# Patient Record
Sex: Male | Born: 1945
Health system: Southern US, Community
[De-identification: ages and names within clinical notes are randomized; demographics above are authoritative.]

## PROBLEM LIST (undated history)

## (undated) DIAGNOSIS — J90 Pleural effusion, not elsewhere classified: Secondary | ICD-10-CM

## (undated) DIAGNOSIS — I509 Heart failure, unspecified: Secondary | ICD-10-CM

## (undated) DIAGNOSIS — N179 Acute kidney failure, unspecified: Secondary | ICD-10-CM

## (undated) DIAGNOSIS — R579 Shock, unspecified: Secondary | ICD-10-CM

## (undated) DIAGNOSIS — J9690 Respiratory failure, unspecified, unspecified whether with hypoxia or hypercapnia: Secondary | ICD-10-CM

## (undated) DIAGNOSIS — I429 Cardiomyopathy, unspecified: Secondary | ICD-10-CM

## (undated) DIAGNOSIS — I4891 Unspecified atrial fibrillation: Secondary | ICD-10-CM

## (undated) DIAGNOSIS — G4730 Sleep apnea, unspecified: Secondary | ICD-10-CM

## (undated) DIAGNOSIS — D62 Acute posthemorrhagic anemia: Secondary | ICD-10-CM

## (undated) DIAGNOSIS — I10 Essential (primary) hypertension: Secondary | ICD-10-CM

## (undated) DIAGNOSIS — S301XXA Contusion of abdominal wall, initial encounter: Secondary | ICD-10-CM

## (undated) DIAGNOSIS — R739 Hyperglycemia, unspecified: Secondary | ICD-10-CM

## (undated) HISTORY — DX: Unspecified atrial fibrillation: I48.91

## (undated) HISTORY — DX: Cardiomyopathy, unspecified: I42.9

## (undated) HISTORY — DX: Essential (primary) hypertension: I10

---

## 2012-03-26 ENCOUNTER — Encounter (HOSPITAL_COMMUNITY): Payer: Self-pay | Admitting: *Deleted

## 2012-03-26 ENCOUNTER — Other Ambulatory Visit: Payer: Self-pay

## 2012-03-26 ENCOUNTER — Emergency Department (HOSPITAL_COMMUNITY): Payer: Medicaid Other

## 2012-03-26 ENCOUNTER — Inpatient Hospital Stay (HOSPITAL_COMMUNITY)
Admission: EM | Admit: 2012-03-26 | Discharge: 2012-04-17 | DRG: 291 | Disposition: A | Discharge status: Home / Self Care | Payer: Medicaid Other | Attending: Cardiovascular Disease | Admitting: Cardiovascular Disease

## 2012-03-26 DIAGNOSIS — I509 Heart failure, unspecified: Secondary | ICD-10-CM

## 2012-03-26 DIAGNOSIS — I5021 Acute systolic (congestive) heart failure: Secondary | ICD-10-CM

## 2012-03-26 DIAGNOSIS — N179 Acute kidney failure, unspecified: Secondary | ICD-10-CM | POA: Diagnosis present

## 2012-03-26 DIAGNOSIS — J90 Pleural effusion, not elsewhere classified: Secondary | ICD-10-CM | POA: Diagnosis present

## 2012-03-26 DIAGNOSIS — I129 Hypertensive chronic kidney disease with stage 1 through stage 4 chronic kidney disease, or unspecified chronic kidney disease: Secondary | ICD-10-CM | POA: Diagnosis present

## 2012-03-26 DIAGNOSIS — R578 Other shock: Secondary | ICD-10-CM | POA: Diagnosis not present

## 2012-03-26 DIAGNOSIS — J9600 Acute respiratory failure, unspecified whether with hypoxia or hypercapnia: Secondary | ICD-10-CM | POA: Diagnosis present

## 2012-03-26 DIAGNOSIS — I2589 Other forms of chronic ischemic heart disease: Secondary | ICD-10-CM | POA: Diagnosis present

## 2012-03-26 DIAGNOSIS — D62 Acute posthemorrhagic anemia: Secondary | ICD-10-CM | POA: Diagnosis not present

## 2012-03-26 DIAGNOSIS — N170 Acute kidney failure with tubular necrosis: Secondary | ICD-10-CM | POA: Diagnosis not present

## 2012-03-26 DIAGNOSIS — E46 Unspecified protein-calorie malnutrition: Secondary | ICD-10-CM | POA: Diagnosis not present

## 2012-03-26 DIAGNOSIS — N189 Chronic kidney disease, unspecified: Secondary | ICD-10-CM

## 2012-03-26 DIAGNOSIS — E871 Hypo-osmolality and hyponatremia: Secondary | ICD-10-CM | POA: Diagnosis not present

## 2012-03-26 DIAGNOSIS — I5023 Acute on chronic systolic (congestive) heart failure: Principal | ICD-10-CM | POA: Diagnosis present

## 2012-03-26 DIAGNOSIS — N19 Unspecified kidney failure: Secondary | ICD-10-CM

## 2012-03-26 DIAGNOSIS — R680 Hypothermia, not associated with low environmental temperature: Secondary | ICD-10-CM | POA: Diagnosis not present

## 2012-03-26 DIAGNOSIS — I472 Ventricular tachycardia, unspecified: Secondary | ICD-10-CM | POA: Diagnosis present

## 2012-03-26 DIAGNOSIS — I4729 Other ventricular tachycardia: Secondary | ICD-10-CM | POA: Diagnosis present

## 2012-03-26 DIAGNOSIS — K625 Hemorrhage of anus and rectum: Secondary | ICD-10-CM | POA: Diagnosis not present

## 2012-03-26 DIAGNOSIS — I4891 Unspecified atrial fibrillation: Secondary | ICD-10-CM | POA: Diagnosis not present

## 2012-03-26 DIAGNOSIS — R570 Cardiogenic shock: Secondary | ICD-10-CM | POA: Diagnosis not present

## 2012-03-26 DIAGNOSIS — R739 Hyperglycemia, unspecified: Secondary | ICD-10-CM

## 2012-03-26 DIAGNOSIS — I4892 Unspecified atrial flutter: Secondary | ICD-10-CM

## 2012-03-26 DIAGNOSIS — S301XXA Contusion of abdominal wall, initial encounter: Secondary | ICD-10-CM

## 2012-03-26 DIAGNOSIS — K5900 Constipation, unspecified: Secondary | ICD-10-CM | POA: Diagnosis not present

## 2012-03-26 DIAGNOSIS — E876 Hypokalemia: Secondary | ICD-10-CM | POA: Diagnosis not present

## 2012-03-26 DIAGNOSIS — T45515A Adverse effect of anticoagulants, initial encounter: Secondary | ICD-10-CM | POA: Diagnosis not present

## 2012-03-26 HISTORY — DX: Respiratory failure, unspecified, unspecified whether with hypoxia or hypercapnia: J96.90

## 2012-03-26 HISTORY — DX: Sleep apnea, unspecified: G47.30

## 2012-03-26 HISTORY — DX: Heart failure, unspecified: I50.9

## 2012-03-26 HISTORY — DX: Hyperglycemia, unspecified: R73.9

## 2012-03-26 HISTORY — DX: Acute kidney failure, unspecified: N17.9

## 2012-03-26 HISTORY — DX: Acute posthemorrhagic anemia: D62

## 2012-03-26 HISTORY — DX: Pleural effusion, not elsewhere classified: J90

## 2012-03-26 HISTORY — DX: Contusion of abdominal wall, initial encounter: S30.1XXA

## 2012-03-26 HISTORY — DX: Shock, unspecified: R57.9

## 2012-03-26 LAB — CARDIAC PANEL(CRET KIN+CKTOT+MB+TROPI)
Relative Index: 6.8 — ABNORMAL HIGH (ref 0.0–2.5)
Troponin I: <0.3 ng/mL (ref <0.30)

## 2012-03-26 LAB — COMPREHENSIVE METABOLIC PANEL
BUN: 26 mg/dL — ABNORMAL HIGH (ref 6–23)
CO2: 22 mEq/L (ref 19–32)
Calcium: 9.5 mg/dL (ref 8.4–10.5)
Chloride: 103 mEq/L (ref 96–112)
Creatinine, Ser: 1.49 mg/dL — ABNORMAL HIGH (ref 0.50–1.35)
GFR calc Af Amer: 55 mL/min — ABNORMAL LOW (ref >90)
GFR calc non Af Amer: 48 mL/min — ABNORMAL LOW (ref >90)
Total Bilirubin: 1.1 mg/dL (ref 0.3–1.2)

## 2012-03-26 LAB — CBC
HCT: 49.5 % (ref 39.0–52.0)
Hemoglobin: 16.1 g/dL (ref 13.0–17.0)
MCHC: 32.5 g/dL (ref 30.0–36.0)
MCV: 90.8 fL (ref 78.0–100.0)
RDW: 15.1 % (ref 11.5–15.5)
WBC: 9.3 10*3/uL (ref 4.0–10.5)

## 2012-03-26 LAB — DIFFERENTIAL
Eosinophils Relative: 0 % (ref 0–5)
Lymphocytes Relative: 11 % — ABNORMAL LOW (ref 12–46)
Monocytes Absolute: 0.9 10*3/uL (ref 0.1–1.0)
Monocytes Relative: 10 % (ref 3–12)
Neutro Abs: 7.3 10*3/uL (ref 1.7–7.7)

## 2012-03-26 LAB — TROPONIN I: Troponin I: 0.34 ng/mL (ref <0.30)

## 2012-03-26 LAB — MRSA PCR SCREENING: MRSA by PCR: NEGATIVE

## 2012-03-26 MED ORDER — ONDANSETRON HCL 4 MG/2ML IJ SOLN: 4.0000 mg | Freq: Four times a day (QID) | INTRAMUSCULAR | Status: DC | PRN

## 2012-03-26 MED ORDER — DILTIAZEM HCL 100 MG IV SOLR
5.0000 mg/h | INTRAVENOUS | Status: DC
  Administered 2012-03-26 – 2012-03-27 (×2): 5 mg/h via INTRAVENOUS
  Administered 2012-03-27: 20 mg/h via INTRAVENOUS
  Filled 2012-03-26 (×2): qty 100

## 2012-03-26 MED ORDER — DILTIAZEM LOAD VIA INFUSION
15.0000 mg | Freq: Once | INTRAVENOUS | Status: AC
  Administered 2012-03-26: 15 mg via INTRAVENOUS
  Filled 2012-03-26: qty 15

## 2012-03-26 MED ORDER — FUROSEMIDE 10 MG/ML IJ SOLN
40.0000 mg | Freq: Once | INTRAMUSCULAR | Status: AC
  Administered 2012-03-26: 40 mg via INTRAVENOUS
  Filled 2012-03-26: qty 4

## 2012-03-26 MED ORDER — NITROGLYCERIN 2 % TD OINT
1.0000 [in_us] | TOPICAL_OINTMENT | Freq: Once | TRANSDERMAL | Status: AC
  Administered 2012-03-26: 1 [in_us] via TOPICAL
  Filled 2012-03-26: qty 30

## 2012-03-26 MED ORDER — FUROSEMIDE 10 MG/ML IJ SOLN
40.0000 mg | Freq: Every day | INTRAMUSCULAR | Status: DC
  Filled 2012-03-26: qty 4

## 2012-03-26 MED ORDER — ACETAMINOPHEN 325 MG PO TABS: 650.0000 mg | ORAL_TABLET | ORAL | Status: DC | PRN

## 2012-03-26 MED ORDER — SODIUM CHLORIDE 0.9 % IJ SOLN
3.0000 mL | INTRAMUSCULAR | Status: DC | PRN
  Administered 2012-04-13: 3 mL via INTRAVENOUS

## 2012-03-26 MED ORDER — SODIUM CHLORIDE 0.9 % IJ SOLN
3.0000 mL | Freq: Two times a day (BID) | INTRAMUSCULAR | Status: DC
  Administered 2012-03-26 – 2012-04-17 (×35): 3 mL via INTRAVENOUS

## 2012-03-26 MED ORDER — NITROGLYCERIN 2 % TD OINT: 1.0000 [in_us] | TOPICAL_OINTMENT | Freq: Once | TRANSDERMAL | Status: DC

## 2012-03-26 MED ORDER — HEPARIN (PORCINE) IN NACL 100-0.45 UNIT/ML-% IJ SOLN
1200.0000 [IU]/h | INTRAMUSCULAR | Status: DC
  Administered 2012-03-26: 1200 [IU]/h via INTRAVENOUS
  Filled 2012-03-26: qty 250

## 2012-03-26 MED ORDER — ADENOSINE 6 MG/2ML IV SOLN
INTRAVENOUS | Status: AC
  Filled 2012-03-26: qty 4

## 2012-03-26 MED ORDER — ADENOSINE 6 MG/2ML IV SOLN
INTRAVENOUS | Status: AC
  Administered 2012-03-26: 6 mg via INTRAVENOUS
  Filled 2012-03-26: qty 2

## 2012-03-26 MED ORDER — SODIUM CHLORIDE 0.9 % IV SOLN: 250.0000 mL | INTRAVENOUS | Status: DC | PRN

## 2012-03-26 MED ORDER — ADENOSINE 6 MG/2ML IV SOLN
12.0000 mg | Freq: Once | INTRAVENOUS | Status: AC
  Administered 2012-03-26: 12 mg via INTRAVENOUS
  Administered 2012-03-26: 6 mg via INTRAVENOUS

## 2012-03-26 MED ORDER — ASPIRIN EC 81 MG PO TBEC
81.0000 mg | DELAYED_RELEASE_TABLET | Freq: Every day | ORAL | Status: DC
  Administered 2012-03-26 – 2012-04-01 (×6): 81 mg via ORAL
  Filled 2012-03-26 (×8): qty 1

## 2012-03-26 MED ORDER — NITROGLYCERIN 2 % TD OINT
0.5000 [in_us] | TOPICAL_OINTMENT | Freq: Once | TRANSDERMAL | Status: AC
  Administered 2012-03-26: 0.5 [in_us] via TOPICAL
  Filled 2012-03-26: qty 1

## 2012-03-26 MED ORDER — DILTIAZEM HCL 50 MG/10ML IV SOLN
INTRAVENOUS | Status: AC
  Administered 2012-03-26: 10 mg
  Filled 2012-03-26: qty 10

## 2012-03-26 MED ORDER — HEPARIN BOLUS VIA INFUSION
5000.0000 [IU] | Freq: Once | INTRAVENOUS | Status: AC
  Administered 2012-03-26: 5000 [IU] via INTRAVENOUS
  Filled 2012-03-26: qty 5000

## 2012-03-26 NOTE — ED Notes (Signed)
[facility] called, gave report 

## 2012-03-26 NOTE — ED Notes (Signed)
Pt states became SOB while cooking breakfast. [facility] and was sent here secondary to SOB and to r/o CHF.

## 2012-03-26 NOTE — ED Provider Notes (Cosign Needed)
History  This chart was scribed for Martin Lennert, MD by Martin [name]. This patient was seen in room APA07/APA07 and the patient's care was started at 4:56PM.  CSN: [id number]  Arrival date & time 03/26/12  1624   First MD Initiated Contact with Patient 03/26/12 1654      Chief Complaint  Patient presents with  . Shortness of Breath  . Cough    Patient is a 66 y.o. male presenting with shortness of breath. The history is provided by the patient. No language interpreter was used.  Shortness of Breath  The current episode started yesterday. The onset was gradual. The problem occurs continuously. The problem has been gradually worsening. The problem is moderate. The symptoms are relieved by rest. The symptoms are aggravated by activity. Associated symptoms include shortness of breath. Pertinent negatives include no chest pain, no fever, no rhinorrhea, no sore throat, no cough and no wheezing. There was no intake of a foreign body. He has not inhaled smoke recently. His past medical history does not include asthma. There were no sick contacts.    Martin [name] is a 66 y.o. male who presents to the Emergency Department complaining of 24 hours of gradual onset, gradually worsening, constant SOB while cooking breakfast. He reports that the SOB is worse with activity and better with rest. He has not tried any at home treatments to improve symptoms PTA. He reports extreme SOB with walking short distances within his house, such as from the kitchen to the bathroom. He denies any current pain such as chest pain or abdominal pain. He lists one week of gradual onset, gradually worsening, constant swelling in bilateral lower legs as an associated symptom. He denies any dizziness, diaphoresis, weakness, nausea or emesis as associated symptoms. He was seen at a [facility] this afternoon and was sent here for further evaluation. Pt has a h/o CHF and HTN but is not on any current medication for the HTN. He is a  frequent alcohol user but denies smoking.  Past Medical History  Diagnosis Date  . CHF (congestive heart failure)   . Hypertension     History reviewed. No pertinent past surgical history.  History reviewed. No pertinent family history.  History  Substance Use Topics  . Smoking status: Never Smoker   . Smokeless tobacco: Not on file  . Alcohol Use: Yes     heavy drinker      Review of Systems  Constitutional: Negative for fever and fatigue.  HENT: Negative for congestion, sore throat, rhinorrhea, sinus pressure and ear discharge.   Eyes: Negative for discharge.  Respiratory: Positive for shortness of breath. Negative for cough and wheezing.   Cardiovascular: Positive for leg swelling. Negative for chest pain.  Gastrointestinal: Negative for abdominal pain and diarrhea.  Genitourinary: Negative for frequency and hematuria.  Musculoskeletal: Negative for back pain.  Skin: Negative for rash.  Neurological: Negative for seizures.  Hematological: Negative.   Psychiatric/Behavioral: Negative for hallucinations.    Allergies  Review of patient's allergies indicates not on file.  Home Medications  No current outpatient prescriptions on file.  Triage Vitals: BP 152/117  Temp(Src) 98.2 F (36.8 C) (Oral)  Resp 44  Ht 5\' 5"  (1.651 m)  Wt 228 lb (103.42 kg)  BMI 37.94 kg/m2  SpO2 97%  Physical Exam  Nursing note and vitals reviewed. Constitutional: He is oriented to person, place, and time. He appears well-developed.  HENT:  Head: Normocephalic and atraumatic.  Eyes: Conjunctivae and EOM are  normal. No scleral icterus.  Neck: Neck supple. No thyromegaly present.  Cardiovascular: Regular rhythm and normal heart sounds.  Tachycardia present.  Exam reveals no gallop and no friction rub.   No murmur heard. Pulmonary/Chest: No stridor. He has no wheezes. He has no rales. He exhibits no tenderness.  Abdominal: He exhibits no distension. There is no tenderness. There is no  rebound.  Musculoskeletal: Normal range of motion. He exhibits edema (2+ edema in bilateral thighs, 3+ edema in bilateral lower legs from knees down).  Lymphadenopathy:    He has no cervical adenopathy.  Neurological: He is oriented to person, place, and time. Coordination normal.  Skin: No rash noted. No erythema.  Psychiatric: He has a normal mood and affect. His behavior is normal.    ED Course  Procedures (including critical care time)  DIAGNOSTIC STUDIES: Oxygen Saturation is 97% on O2, adequate by my interpretation.    COORDINATION OF CARE: 4:59PM-Discussed treatment plan with pt and pt agreed to plan. Will give pt medicine for tachycardia.  5:30PM-Discussed radiology report with pt and pt acknowledged report. Discussed further treatment plan and pt agreed. 5:40PM-Spoke with Martin [name] at [facility] and we agreed on the pt being transferred.    Labs Reviewed  DIFFERENTIAL - Abnormal; Notable for the following:    Neutrophils Relative 79 (*)    Lymphocytes Relative 11 (*)    All other components within normal limits  COMPREHENSIVE METABOLIC PANEL - Abnormal; Notable for the following:    Glucose, Bld 162 (*)    BUN 26 (*)    Creatinine, Ser 1.49 (*)    Albumin 3.4 (*)    AST 91 (*)    ALT 156 (*)    GFR calc non Af Amer 48 (*)    GFR calc Af Amer 55 (*)    All other components within normal limits  CBC  TROPONIN I    Dg Chest Port 1 View  03/26/2012  *RADIOLOGY REPORT*  Clinical Data: Shortness of breath and hypertension.  PORTABLE CHEST - 1 VIEW  Comparison: None.  Findings: The heart is enlarged.  The mediastinal and hilar contours are prominent.  Large area of opacification involving the right lung may be a combination of effusion and atelectasis or infiltrate.  There is central vascular congestion without overt pulmonary edema.  No left-sided pleural effusion.  IMPRESSION:  1.  Cardiac enlargement and prominent mediastinal and hilar contours. 2.  Right lung process  possibly a combination of effusion and atelectasis and/or infiltrate.  Original Report Authenticated By: P. Loralie Champagne, M.D.    Date: 03/26/2012  Rate:161  Rhythm: supraventricular tachycardia (SVT)  QRS Axis: normal  Intervals: normal  ST/T Wave abnormalities: normal  Conduction Disutrbances:none  Narrative Interpretation:   Old EKG Reviewed: none available    No diagnosis found.. CRITICAL CARE Performed by: Mikiyah Glasner L   Total critical care time:45  Critical care time was exclusive of separately billable procedures and treating other patients.  Critical care was necessary to treat or prevent imminent or life-threatening deterioration.  Critical care was time spent personally by me on the following activities: development of treatment plan with patient and/or surrogate as well as nursing, discussions with consultants, evaluation of patient's response to treatment, examination of patient, obtaining history from patient or surrogate, ordering and performing treatments and interventions, ordering and review of laboratory studies, ordering and review of radiographic studies, pulse oximetry and re-evaluation of patient's condition.  I gave the pt adenosine twice and cardiazem  without any slowing of the heart.  I spoke with dr. Melburn Popper and he stated to not try any more medicine and they will admit at [facility]  Chf, tachycardia.  cardiomyapathy  The chart was scribed for me under my direct supervision.  I personally performed the history, physical, and medical decision making and all procedures in the evaluation of this patient.Martin Lennert, MD 03/26/12 1813  Martin Lennert, MD 03/26/12 [phone]

## 2012-03-26 NOTE — H&P (Addendum)
Admit date: 03/26/2012 Referring Physician  Dr. Estell Harpin Chief complaint/reason for admission: CHF/SVT  HPI: This is a 66yo AA male with no PMH except HTN on no meds who presented to [facility] with compliants of SOB and cough.  This began today and was gradual in onset.  He says that he has had a nonproductive cough for about 3 days.  He denies any fever or chills. He denies any chest pain.  He denies any palpitations, dizziness or syncope but he has noticed LE edema for about 4 days. He was noted to be in SVT in the ER and was given Adenosine 6mg  then 12mg  with no change. He was very hypertensive in the ER 151/121mmHg.  He was given 10mg  IV Cardizem with no improvement in HTN or tachcyardia.  In route to [facility] he was given NTP and Lasix 40mg  IV due to SOB.  He was also given Labetolol 5mg  IV in route. On arrival at [facility] he was in SVT at 153bpm and 12 lead EKG appears to be atrial flutter.  He is asymptomatic at this time.  Of note he has a history of ETOH abuse in the past but has not had any ETOH in a month.    PMH:    Past Medical History  Diagnosis Date  . Hypertension     PSH:   History reviewed. No pertinent past surgical history.  ALLERGIES:   Review of patient's allergies indicates no known allergies.  Prior to Admit Meds:   No prescriptions prior to admission   Family HX:   History reviewed. No pertinent family history. Social HX:    History   Social History  . Marital Status: Married    Spouse Name: N/A    Number of Children: N/A  . Years of Education: N/A   Occupational History  . Not on file.   Social History Main Topics  . Smoking status: Never Smoker   . Smokeless tobacco: Not on file  . Alcohol Use: Yes     heavy drinker  . Drug Use: No  . Sexually Active: Yes   Other Topics Concern  . Not on file   Social History Narrative  . No narrative on file     ROS:  All 11 ROS were addressed and are negative except what is stated in the HPI  PHYSICAL  EXAM Filed Vitals:   03/26/12 2110  BP: 112/86  Pulse: 154  Temp: 94.4 F (34.7 C)  Resp: 31   General: Well developed, well nourished, in no acute distress Head: Eyes PERRLA, No xanthomas.   Normal cephalic and atramatic  Lungs:   Clear bilaterally to auscultation and percussion. Heart:   Irregularly irregular, tachycardic S1 S2 Pulses are 2+ & equal.            No carotid bruit. No JVD.  No abdominal bruits. No femoral bruits. Abdomen: Bowel sounds are positive, abdomen soft and non-tender without masses  Extremities:   No clubbing, cyanosis or edema.  DP +1 Neuro: Alert and oriented X 3. Psych:  Good affect, responds appropriately   Labs:   Lab Results  Component Value Date   WBC 9.3 03/26/2012   HGB 16.1 03/26/2012   HCT 49.5 03/26/2012   MCV 90.8 03/26/2012   PLT 201 03/26/2012    Lab 03/26/12 1715  NA 142  K 3.8  CL 103  CO2 22  BUN 26*  CREATININE 1.49*  CALCIUM 9.5  PROT 7.3  BILITOT 1.1  ALKPHOS 116  ALT 156*  AST 91*  GLUCOSE 162*   Lab Results  Component Value Date   TROPONINI 0.34* 03/26/2012       Radiology:  *RADIOLOGY REPORT*  Clinical Data: Shortness of breath and hypertension.  PORTABLE CHEST - 1 VIEW  Comparison: None.  Findings: The heart is enlarged. The mediastinal and hilar  contours are prominent. Large area of opacification involving the  right lung may be a combination of effusion and atelectasis or  infiltrate. There is central vascular congestion without overt  pulmonary edema. No left-sided pleural effusion.  IMPRESSION:  1. Cardiac enlargement and prominent mediastinal and hilar  contours.  2. Right lung process possibly a combination of effusion and  atelectasis and/or infiltrate.  Original Report Authenticated By: P. Loralie Champagne, M.D.   EKG:  SVT - probable atrial flutter with RVR  ASSESSMENT:  1.  SVT c/w atrial flutter with RVR of unknown duration 2.  Hypertensive urgency 3.  Acute CHF most likely secondary to SVT and  HTN.  Need to also consider alcoholic DCM with history of ETOH abuse. 4.  Acute renal insufficiency 5.  Hepatic transaminitis - ? ETOH related 6.  Elevated troponin in the setting of tachycardia and HTN urgency c/w NSTEMI type II   PLAN:   1.  Admit to stepdown unit 2.  IV Cardizem 15mg  bolus followed by 5mg /hr and titrate to keep HR less than 100bpm and SBP less than and greater than 3.  IV Heparin gtt since length of aflutter unknown 4.  Cycle cardiac enzymes 5.  Hepatitis panel/TSH/BNP 6.  IV Lopressor as needed for rate control 7.  2D echo in am to assess LVF 8.  Further w/u per [facility] 9.  Continue NTP for BP control  Quintella Reichert, MD  03/26/2012  9:44 PM

## 2012-03-26 NOTE — Progress Notes (Signed)
ANTICOAGULATION CONSULT NOTE - Initial Consult  Pharmacy Consult for heparin Indication: atrial fibrillation  No Known Allergies  Patient Measurements: Height: 5\' 5"  (165.1 cm) Weight: 222 lb 14.2 oz (101.1 kg) IBW/kg (Calculated) : 61.5  Heparin Dosing Weight: 84  Vital Signs: Temp: 94.4 F (34.7 C) (04/01 2110) Temp src: Axillary (04/01 2110) BP: 112/86 mmHg (04/01 2110) Pulse Rate: 154  (04/01 2110)  Labs:  Basename 03/26/12 1715  HGB 16.1  HCT 49.5  PLT 201  APTT --  LABPROT --  INR --  HEPARINUNFRC --  CREATININE 1.49*  CKTOTAL --  CKMB --  TROPONINI 0.34*   Estimated Creatinine Clearance: 55.5 ml/min (by C-G formula based on Cr of 1.49).  Medical History: Past Medical History  Diagnosis Date  . Hypertension     Medications:  No prescriptions prior to admission    Assessment: 66 yo M admitted with new aflutter.  Goal of Therapy:  Heparin level 0.3-0.7 units/ml   Plan:  1.  Heparin 5000 units bolus x 1 2.  Heparin drip at 1200 units/hr 3.  Heparin level 6h after drip starts 4.  Check daily heparin level and CBC  Edelyn Heidel L. Illene Bolus, PharmD, BCPS Clinical Pharmacist Pager: [phone] 03/26/2012 10:39 PM

## 2012-03-26 NOTE — ED Notes (Signed)
CRITICAL VALUE ALERT  Critical value received:  Troponin 0.34  Date of notification:  03/26/12  Time of notification:  1808  Critical value read back: yes  Nurse who received alert:  Arva Chafe RN  MD notified (1st page):  zammit  Time of first page:  1809  MD notified (2nd page):  Time of second page:  Responding MD:  zammit  Time MD responded:  [phone]

## 2012-03-26 NOTE — ED Notes (Signed)
Pt reports severe sob for the past 3 weeks.  Pt reports "i feel like my heart is racing".  Pt denies any cp, n/v, diaphoresis.  Pt in room on monitor.  Family at bedside.  edp aware of pt's heart rate.

## 2012-03-27 ENCOUNTER — Inpatient Hospital Stay (HOSPITAL_COMMUNITY): Payer: Medicaid Other

## 2012-03-27 ENCOUNTER — Other Ambulatory Visit: Payer: Self-pay

## 2012-03-27 DIAGNOSIS — R570 Cardiogenic shock: Secondary | ICD-10-CM | POA: Diagnosis not present

## 2012-03-27 DIAGNOSIS — I509 Heart failure, unspecified: Secondary | ICD-10-CM

## 2012-03-27 DIAGNOSIS — I4891 Unspecified atrial fibrillation: Secondary | ICD-10-CM | POA: Diagnosis present

## 2012-03-27 DIAGNOSIS — J984 Other disorders of lung: Secondary | ICD-10-CM

## 2012-03-27 DIAGNOSIS — N179 Acute kidney failure, unspecified: Secondary | ICD-10-CM | POA: Diagnosis present

## 2012-03-27 LAB — GLUCOSE, CAPILLARY
Glucose-Capillary: 217 mg/dL — ABNORMAL HIGH (ref 70–99)
Glucose-Capillary: 101 mg/dL — ABNORMAL HIGH (ref 70–99)

## 2012-03-27 LAB — CARDIAC PANEL(CRET KIN+CKTOT+MB+TROPI)
Total CK: 104 U/L (ref 7–232)
Total CK: 96 U/L (ref 7–232)
Troponin I: <0.3 ng/mL (ref <0.30)

## 2012-03-27 LAB — HEPARIN LEVEL (UNFRACTIONATED)
Heparin Unfractionated: 1.5 IU/mL — ABNORMAL HIGH (ref 0.30–0.70)
Heparin Unfractionated: 1.34 IU/mL — ABNORMAL HIGH (ref 0.30–0.70)

## 2012-03-27 LAB — CORTISOL: Cortisol, Plasma: 60 ug/dL

## 2012-03-27 LAB — BASIC METABOLIC PANEL
BUN: 30 mg/dL — ABNORMAL HIGH (ref 6–23)
CO2: 23 mEq/L (ref 19–32)
Calcium: 9 mg/dL (ref 8.4–10.5)
Creatinine, Ser: 1.52 mg/dL — ABNORMAL HIGH (ref 0.50–1.35)
GFR calc non Af Amer: 47 mL/min — ABNORMAL LOW (ref >90)
Glucose, Bld: 176 mg/dL — ABNORMAL HIGH (ref 70–99)
Sodium: 140 mEq/L (ref 135–145)

## 2012-03-27 LAB — POCT I-STAT 3, ART BLOOD GAS (G3+)
Bicarbonate: 18.8 mEq/L — ABNORMAL LOW (ref 20.0–24.0)
pH, Arterial: 7.332 — ABNORMAL LOW (ref 7.350–7.450)
pO2, Arterial: 169 mmHg — ABNORMAL HIGH (ref 80.0–100.0)

## 2012-03-27 LAB — CBC
MCH: 29.6 pg (ref 26.0–34.0)
MCHC: 32.6 g/dL (ref 30.0–36.0)
Platelets: 196 10*3/uL (ref 150–400)
RDW: 15.2 % (ref 11.5–15.5)

## 2012-03-27 LAB — TSH: TSH: 1.007 u[IU]/mL (ref 0.350–4.500)

## 2012-03-27 LAB — PRO B NATRIURETIC PEPTIDE: Pro B Natriuretic peptide (BNP): 3740 pg/mL — ABNORMAL HIGH (ref 0–125)

## 2012-03-27 LAB — PROCALCITONIN: Procalcitonin: 0.61 ng/mL

## 2012-03-27 LAB — APTT: aPTT: 31 seconds (ref 24–37)

## 2012-03-27 MED ORDER — FOLIC ACID 1 MG PO TABS
1.0000 mg | ORAL_TABLET | Freq: Every day | ORAL | Status: DC
  Administered 2012-03-28 – 2012-04-01 (×5): 1 mg via ORAL
  Filled 2012-03-27 (×7): qty 1

## 2012-03-27 MED ORDER — FENTANYL CITRATE 0.05 MG/ML IJ SOLN
INTRAMUSCULAR | Status: AC
  Administered 2012-03-27: 200 ug via INTRAVENOUS
  Filled 2012-03-27: qty 4

## 2012-03-27 MED ORDER — LORAZEPAM 1 MG PO TABS: 1.0000 mg | ORAL_TABLET | Freq: Four times a day (QID) | ORAL | Status: DC | PRN

## 2012-03-27 MED ORDER — AMIODARONE HCL IN DEXTROSE 360-4.14 MG/200ML-% IV SOLN
30.0000 mg/h | INTRAVENOUS | Status: DC
  Administered 2012-03-30 – 2012-04-02 (×3): 30 mg/h via INTRAVENOUS
  Filled 2012-03-27 (×23): qty 200

## 2012-03-27 MED ORDER — INSULIN ASPART 100 UNIT/ML ~~LOC~~ SOLN
0.0000 [IU] | SUBCUTANEOUS | Status: DC
  Administered 2012-03-27 – 2012-03-29 (×5): 1 [IU] via SUBCUTANEOUS
  Administered 2012-03-29: 3 [IU] via SUBCUTANEOUS
  Administered 2012-03-29 (×2): 1 [IU] via SUBCUTANEOUS
  Administered 2012-03-29: 2 [IU] via SUBCUTANEOUS
  Administered 2012-03-30 (×2): 1 [IU] via SUBCUTANEOUS
  Administered 2012-03-30: 2 [IU] via SUBCUTANEOUS
  Administered 2012-03-30: 1 [IU] via SUBCUTANEOUS
  Administered 2012-03-30: 2 [IU] via SUBCUTANEOUS
  Administered 2012-03-30: 1 [IU] via SUBCUTANEOUS
  Administered 2012-03-31 (×2): 2 [IU] via SUBCUTANEOUS
  Administered 2012-03-31 – 2012-04-01 (×2): 1 [IU] via SUBCUTANEOUS
  Administered 2012-04-01 (×3): 2 [IU] via SUBCUTANEOUS
  Administered 2012-04-02: 1 [IU] via SUBCUTANEOUS
  Administered 2012-04-02: 2 [IU] via SUBCUTANEOUS
  Administered 2012-04-02 – 2012-04-05 (×11): 1 [IU] via SUBCUTANEOUS
  Administered 2012-04-05 – 2012-04-06 (×3): 2 [IU] via SUBCUTANEOUS
  Administered 2012-04-06: 1 [IU] via SUBCUTANEOUS
  Administered 2012-04-06: 2 [IU] via SUBCUTANEOUS
  Administered 2012-04-06 – 2012-04-08 (×8): 1 [IU] via SUBCUTANEOUS
  Administered 2012-04-08: 2 [IU] via SUBCUTANEOUS
  Administered 2012-04-08: 1 [IU] via SUBCUTANEOUS
  Administered 2012-04-08: 2 [IU] via SUBCUTANEOUS
  Administered 2012-04-09: 1 [IU] via SUBCUTANEOUS
  Administered 2012-04-09: 2 [IU] via SUBCUTANEOUS
  Administered 2012-04-09 – 2012-04-10 (×4): 1 [IU] via SUBCUTANEOUS
  Administered 2012-04-10: 3 [IU] via SUBCUTANEOUS
  Administered 2012-04-10 – 2012-04-11 (×2): 1 [IU] via SUBCUTANEOUS

## 2012-03-27 MED ORDER — FENTANYL BOLUS VIA INFUSION
50.0000 ug | Freq: Four times a day (QID) | INTRAVENOUS | Status: DC | PRN
  Filled 2012-03-27: qty 100

## 2012-03-27 MED ORDER — NOREPINEPHRINE BITARTRATE 1 MG/ML IJ SOLN
2.0000 ug/min | INTRAVENOUS | Status: DC
  Administered 2012-03-27: 10 ug/min via INTRAVENOUS
  Filled 2012-03-27 (×2): qty 8

## 2012-03-27 MED ORDER — DOPAMINE-DEXTROSE 3.2-5 MG/ML-% IV SOLN
2.0000 ug/kg/min | INTRAVENOUS | Status: DC
  Administered 2012-03-27: 8 ug via INTRAVENOUS

## 2012-03-27 MED ORDER — MIDAZOLAM HCL 2 MG/2ML IJ SOLN
INTRAMUSCULAR | Status: AC
  Administered 2012-03-27: 4 mg via INTRAVENOUS
  Filled 2012-03-27: qty 4

## 2012-03-27 MED ORDER — FENTANYL CITRATE 0.05 MG/ML IJ SOLN
50.0000 ug | INTRAMUSCULAR | Status: DC | PRN
  Administered 2012-03-27 (×2): 50 ug via INTRAVENOUS
  Filled 2012-03-27: qty 2

## 2012-03-27 MED ORDER — AMIODARONE LOAD VIA INFUSION
150.0000 mg | Freq: Once | INTRAVENOUS | Status: DC
  Administered 2012-03-27: 150 mg via INTRAVENOUS
  Filled 2012-03-27: qty 83.34

## 2012-03-27 MED ORDER — IPRATROPIUM BROMIDE 0.02 % IN SOLN
0.5000 mg | Freq: Four times a day (QID) | RESPIRATORY_TRACT | Status: DC
  Administered 2012-03-27 – 2012-04-03 (×30): 0.5 mg via RESPIRATORY_TRACT
  Filled 2012-03-27 (×30): qty 2.5

## 2012-03-27 MED ORDER — DOPAMINE-DEXTROSE 3.2-5 MG/ML-% IV SOLN
INTRAVENOUS | Status: AC
  Administered 2012-03-27: 8 ug via INTRAVENOUS
  Filled 2012-03-27: qty 250

## 2012-03-27 MED ORDER — LORAZEPAM 2 MG/ML IJ SOLN: 1.0000 mg | Freq: Four times a day (QID) | INTRAMUSCULAR | Status: DC | PRN

## 2012-03-27 MED ORDER — EPINEPHRINE HCL 0.1 MG/ML IJ SOLN
INTRAMUSCULAR | Status: AC
  Filled 2012-03-27: qty 10

## 2012-03-27 MED ORDER — ETOMIDATE 2 MG/ML IV SOLN
5.0000 mg | Freq: Once | INTRAVENOUS | Status: AC
  Administered 2012-03-27: 5 mg via INTRAVENOUS

## 2012-03-27 MED ORDER — FENTANYL CITRATE 0.05 MG/ML IJ SOLN
200.0000 ug | Freq: Once | INTRAMUSCULAR | Status: AC
  Administered 2012-03-27: 200 ug via INTRAVENOUS

## 2012-03-27 MED ORDER — ROCURONIUM BROMIDE 50 MG/5ML IV SOLN
0.6000 mg/kg | Freq: Once | INTRAVENOUS | Status: AC
  Administered 2012-03-27: 50 mg via INTRAVENOUS

## 2012-03-27 MED ORDER — THIAMINE HCL 100 MG/ML IJ SOLN
100.0000 mg | Freq: Every day | INTRAMUSCULAR | Status: DC
  Administered 2012-03-30: 100 mg via INTRAVENOUS
  Filled 2012-03-27 (×7): qty 1

## 2012-03-27 MED ORDER — METOPROLOL TARTRATE 1 MG/ML IV SOLN
5.0000 mg | Freq: Once | INTRAVENOUS | Status: AC
  Administered 2012-03-27: 5 mg via INTRAVENOUS
  Filled 2012-03-27: qty 5

## 2012-03-27 MED ORDER — SODIUM CHLORIDE 0.9 % IV SOLN
2.0000 mg/h | INTRAVENOUS | Status: DC
  Administered 2012-03-27 – 2012-03-28 (×3): 2 mg/h via INTRAVENOUS
  Administered 2012-03-31: 1 mg/h via INTRAVENOUS
  Administered 2012-03-31: 4 mg/h via INTRAVENOUS
  Administered 2012-04-01: 1 mg/h via INTRAVENOUS
  Filled 2012-03-27 (×7): qty 10

## 2012-03-27 MED ORDER — AMIODARONE HCL IN DEXTROSE 360-4.14 MG/200ML-% IV SOLN
60.0000 mg/h | INTRAVENOUS | Status: AC
  Administered 2012-03-27: 60 mg/h via INTRAVENOUS
  Filled 2012-03-27: qty 200

## 2012-03-27 MED ORDER — VANCOMYCIN HCL 1000 MG IV SOLR
2000.0000 mg | Freq: Once | INTRAVENOUS | Status: AC
  Administered 2012-03-27: 2000 mg via INTRAVENOUS
  Filled 2012-03-27: qty 2000

## 2012-03-27 MED ORDER — MIDAZOLAM HCL 2 MG/2ML IJ SOLN
2.0000 mg | INTRAMUSCULAR | Status: DC | PRN
  Administered 2012-03-27 (×2): 2 mg via INTRAVENOUS
  Filled 2012-03-27 (×2): qty 2

## 2012-03-27 MED ORDER — FENTANYL CITRATE 0.05 MG/ML IJ SOLN
50.0000 ug/h | INTRAMUSCULAR | Status: DC
  Administered 2012-03-27 – 2012-03-28 (×2): 50 ug/h via INTRAVENOUS
  Administered 2012-03-31: 200 ug/h via INTRAVENOUS
  Administered 2012-03-31: 25 ug/h via INTRAVENOUS
  Filled 2012-03-27 (×4): qty 50

## 2012-03-27 MED ORDER — MIDAZOLAM HCL 2 MG/2ML IJ SOLN
4.0000 mg | Freq: Once | INTRAMUSCULAR | Status: AC
  Administered 2012-03-27: 4 mg via INTRAVENOUS

## 2012-03-27 MED ORDER — MIDAZOLAM BOLUS VIA INFUSION
1.0000 mg | INTRAVENOUS | Status: DC | PRN
  Administered 2012-03-31: 0.5 mg via INTRAVENOUS
  Filled 2012-03-27 (×2): qty 2

## 2012-03-27 MED ORDER — HEPARIN (PORCINE) IN NACL 100-0.45 UNIT/ML-% IJ SOLN
1100.0000 [IU]/h | INTRAMUSCULAR | Status: DC
  Administered 2012-03-27: 1100 [IU]/h via INTRAVENOUS
  Filled 2012-03-27 (×3): qty 250

## 2012-03-27 MED ORDER — ADULT MULTIVITAMIN W/MINERALS CH
1.0000 | ORAL_TABLET | Freq: Every day | ORAL | Status: DC
  Administered 2012-03-28 – 2012-04-01 (×5): 1 via ORAL
  Filled 2012-03-27 (×7): qty 1

## 2012-03-27 MED ORDER — METOPROLOL TARTRATE 25 MG PO TABS
25.0000 mg | ORAL_TABLET | Freq: Four times a day (QID) | ORAL | Status: DC
  Administered 2012-03-27: 25 mg via ORAL
  Filled 2012-03-27 (×13): qty 1

## 2012-03-27 MED ORDER — PIPERACILLIN-TAZOBACTAM 3.375 G IVPB
3.3750 g | Freq: Three times a day (TID) | INTRAVENOUS | Status: DC
  Administered 2012-03-27 – 2012-04-03 (×21): 3.375 g via INTRAVENOUS
  Filled 2012-03-27 (×24): qty 50

## 2012-03-27 MED ORDER — BIOTENE DRY MOUTH MT LIQD
15.0000 mL | Freq: Four times a day (QID) | OROMUCOSAL | Status: DC
  Administered 2012-03-28 – 2012-04-02 (×22): 15 mL via OROMUCOSAL

## 2012-03-27 MED ORDER — NOREPINEPHRINE BITARTRATE 1 MG/ML IJ SOLN: 2.0000 ug/min | INTRAVENOUS | Status: DC

## 2012-03-27 MED ORDER — SODIUM CHLORIDE 0.9 % IV BOLUS (SEPSIS)
250.0000 mL | Freq: Once | INTRAVENOUS | Status: AC
  Administered 2012-03-27: 250 mL via INTRAVENOUS

## 2012-03-27 MED ORDER — VITAMIN B-1 100 MG PO TABS
100.0000 mg | ORAL_TABLET | Freq: Every day | ORAL | Status: DC
  Administered 2012-03-28 – 2012-04-01 (×4): 100 mg via ORAL
  Filled 2012-03-27 (×7): qty 1

## 2012-03-27 MED ORDER — FUROSEMIDE 10 MG/ML IJ SOLN
40.0000 mg | Freq: Two times a day (BID) | INTRAMUSCULAR | Status: DC
  Administered 2012-03-27: 40 mg via INTRAVENOUS
  Filled 2012-03-27 (×3): qty 4

## 2012-03-27 MED ORDER — LEVALBUTEROL HCL 0.63 MG/3ML IN NEBU
0.6300 mg | INHALATION_SOLUTION | Freq: Four times a day (QID) | RESPIRATORY_TRACT | Status: DC
  Administered 2012-03-27 – 2012-04-03 (×28): 0.63 mg via RESPIRATORY_TRACT
  Filled 2012-03-27 (×32): qty 3

## 2012-03-27 MED ORDER — CHLORHEXIDINE GLUCONATE 0.12 % MT SOLN
15.0000 mL | Freq: Two times a day (BID) | OROMUCOSAL | Status: DC
  Administered 2012-03-27 – 2012-04-02 (×13): 15 mL via OROMUCOSAL
  Filled 2012-03-27 (×13): qty 15

## 2012-03-27 MED ORDER — PANTOPRAZOLE SODIUM 40 MG PO PACK
40.0000 mg | PACK | Freq: Every day | ORAL | Status: DC
  Administered 2012-03-27 – 2012-04-01 (×6): 40 mg
  Filled 2012-03-27 (×8): qty 20

## 2012-03-27 MED ORDER — VANCOMYCIN HCL 1000 MG IV SOLR
1500.0000 mg | INTRAVENOUS | Status: DC
  Administered 2012-03-28 – 2012-03-29 (×2): 1500 mg via INTRAVENOUS
  Filled 2012-03-27 (×3): qty 1500

## 2012-03-27 MED FILL — Metoprolol Tartrate IV Soln 5 MG/5ML: INTRAVENOUS | Qty: 5 | Status: AC

## 2012-03-27 NOTE — Progress Notes (Signed)
03/27/12 0500.  Pt in aflutter 150's,  bp 100-115/80's on 5mg  IV cardizem.  sO2 98% on 4L nasal cannula.  Pt denies pain, in no acute distress.  AM labs unremarkable (see results).  Pt rec'd 25mg  po lopressor at 0425.  McQueen notified about pt's heart rate.  Orders rec'd to make pt NPO, check cbg's q4hr, and to titrate cardizem gtt up as needed/tolerated with a max of 20mg /hr.  Will continue to monitor.

## 2012-03-27 NOTE — Procedures (Signed)
Intubation Procedure Note Martin [name] [id number] 02-01-1946  Procedure: Intubation Indications: Airway protection and maintenance  Procedure Details Consent: Risks of procedure as well as the alternatives and risks of each were explained to the (patient/caregiver).  Consent for procedure obtained. Time Out: Verified patient identification, verified procedure, site/side was marked, verified correct patient position, special equipment/implants available, medications/allergies/relevent history reviewed, required imaging and test results available.  Performed  3 Medications:  Fentanyl 200 mcg Etomidate 10 mg Versed 2 mg NMB 6 mg norcuron   Evaluation Hemodynamic Status: Transient hypotension treated with pressors and treated with fluid; O2 sats: transiently fell during during procedure Patient's Current Condition: stable Complications: No apparent complications Patient did tolerate procedure well. Chest X-ray ordered to verify placement.  CXR: pending.   Brett Canales Minor ACNP Adolph Pollack PCCM Pager [phone] till 3 pm If no answer page [phone] 03/27/2012, 11:08 AM

## 2012-03-27 NOTE — Consult Note (Signed)
Name: Martin [name] MRN: [id number] DOB: 06-05-1946    LOS: 1 Requesting MD: Daleen Squibb Regarding: Resp failure. Ventricular ectopy   ADMIT/ CONSULT NOTE  History of Present Illness: 66 yo AAM who presented to [facility] ED 4/1 with CC: of SOB. He developed SVT/aib rvr in ED and was tx with adenosine with`subsequent hypertension. Transferred to [facility] 4/1 and developed increased resp distress and hypotension. PCCM called to bedside 4/2 for urgent intubation and treatment of hypotension.  Lines / Drains: 4/2 lt i j cvl>> 4/2 OTT>> 4/2 rt rtad aline>>  Cultures: 4/2 sput>> 4/2 bc x 2> 4/2 urine>>   Antibiotics: none  Tests / Events: 4/2 intubated Subjective: Sedated on vent Past Medical History  Diagnosis Date  . Hypertension    History reviewed. No pertinent past surgical history. Prior to Admission medications   Not on File   Allergies No Known Allergies  Family History History reviewed. No pertinent family history.  Social History  reports that he has never smoked. He does not have any smokeless tobacco history on file. He reports that he drinks alcohol. He reports that he does not use illicit drugs.  Review Of Systems  NA  Vital Signs: Temp:  [94.4 F (34.7 C)-98.2 F (36.8 C)] 97.1 F (36.2 C) (04/02 0805) Pulse Rate:  [34-161] 75  (04/02 1106) Resp:  [17-44] 20  (04/02 1106) BP: (61-152)/(14-128) 72/56 mmHg (04/02 1106) SpO2:  [95 %-100 %] 100 % (04/02 1106) FiO2 (%):  [100 %] 100 % (04/02 1106) Weight:  [219 lb 5.7 oz (99.5 kg)-228 lb (103.42 kg)] 219 lb 5.7 oz (99.5 kg) (04/02 0345) I/O last 3 completed shifts: In: 521.8 [P.O.:120; I.V.:401.8] Out: 150 [Urine:150]  Physical Examination: General:  Obese AAM sedated on vent. Neuro:  sedated HEENT: poor dentation, no jvd Cardiovascular:  hsir ir Lungs:  Coarse  rhonchi Abdomen: obese +bs Musculoskeletal:  intact Skin:  2 + le edema  Ventilator settings: Vent Mode:  [-] PRVC FiO2 (%):  [100 %] 100 % Set Rate:  [20 bmp] 20 bmp Vt Set:  [490 mL] 490 mL PEEP:  [5 cmH20] 5 cmH20 Plateau Pressure:  [25 cmH20] 25 cmH20  Labs and Imaging:  Dg Chest Port 1 View  03/26/2012  *RADIOLOGY REPORT*  Clinical Data: Shortness of breath and hypertension.  PORTABLE CHEST - 1 VIEW  Comparison: None.  Findings: The heart is enlarged.  The mediastinal and hilar contours are prominent.  Large area of opacification involving the right lung may be a combination of effusion and atelectasis or infiltrate.  There is central vascular congestion without overt pulmonary edema.  No left-sided pleural effusion.  IMPRESSION:  1.  Cardiac enlargement and prominent mediastinal and hilar contours. 2.  Right lung process possibly a combination of effusion and atelectasis and/or infiltrate.  Original Report Authenticated By: P. Loralie Champagne, M.D.    Lab  03/27/12 0415 03/26/12 1715  NA 140 142  K 4.3 3.8  CL 105 103  CO2 23 22  BUN 30* 26*  CREATININE 1.52* 1.49*  GLUCOSE 176* 162*    Lab 03/27/12 0415 03/26/12 1715  HGB 14.9 16.1  HCT 45.7 49.5  WBC 9.3 9.3  PLT 196 201   ABG No results found for this basename: phart, pco2, pco2art, po2, po2art, hco3, tco2, acidbasedef, o2sat    Assessment and Plan: VDRF, multifactorial with CHF, Afib RVR, ? Pulmonary infection on CxR. -full vent support -Bd with xopenex -pan culture -broad spectrum abx  Shock(cold) shock ? cardiogenic vs sepsis -pressor and fluid support --check procal and lactic acid -check cvp >25. Consider pa cath -2 d  CHF -hold any further diuresis while in shock \ Afib/RVR per cards  Best practices / Disposition: -->ICU status under Cards -->full code -->Heparin drip for DVT Px -->Protonix for GI Px -->ventilator bundle -->diet npo   Martin [name] ACNP Martin [name] PCCM Pager [phone] till 3  pm If no answer page [phone] 03/27/2012, 11:11 AM  Patient intubated for full mechanical support due to mental status changes, hypotension and pulmonary edema.  Will hold diureses for now and treat with Abx.  CC time 90 minutes.  Patient seen and examined, agree with above note.  I dictated the care and orders written for this patient under my direction.  [name], M.D. [phone]

## 2012-03-27 NOTE — Progress Notes (Signed)
  Echocardiogram 2D Echocardiogram has been performed.  Martin [name], Real Cons 03/27/2012, 5:26 PM

## 2012-03-27 NOTE — Progress Notes (Signed)
Pt. called and complained of not feeling well and very hot, noted HR still in the 140'-150's SBP- 90's  Pale looking,called rapid response to evaluate pt and also called Dr. Jenne Campus to update pt. Status. With orders made. Noted HR slows down to 70's, shows a-flutter in the monitor, 12 lead EKG done to verify. At 0300 BP was 89/61  Pt. Cont. to have symptoms Dr. Jenne Campus was notified again with orders made. 250NS bolus was given and resp. gave one dose of atrovent. prior to transfer. Rapid response transfer pt. to 2300.

## 2012-03-27 NOTE — Progress Notes (Signed)
UR Completed. Simmons, Hanae Waiters F [phone]  

## 2012-03-27 NOTE — Progress Notes (Signed)
Patient ID: Martin [name], male   DOB: [birth year], 66 y.o.   MRN: [id number] Called to see patient with persistent hypotension. Heart rate decreased to 120 on amiodarone. Diltiazem drip stopped. Began dopamine and called STAT CCM evaluation. Dr Molli Knock in process of intubating. Amiodarone discontinued for now and 1 liter of NS hanging.

## 2012-03-27 NOTE — Progress Notes (Signed)
Pt. was assessed and HR cont. To be in the 150's, appears comfortable denies any chest pain, and just short of breath with exertion. O2 sats. stable at 4l. Cardizem gtt. Titrated to 20 mg./hr. After an hour of titration still tachycardic, Dr. Jenne Campus made aware with orders made. Lopressor 5mg . IV was given at 0200. Will cont. to monitor pt.

## 2012-03-27 NOTE — Progress Notes (Signed)
ANTICOAGULATION CONSULT NOTE - Follow Up Consult  Pharmacy Consult for heparin Indication: atrial fibrillation  No Known Allergies  Patient Measurements: Height: 5\' 5"  (165.1 cm) Weight: 219 lb 5.7 oz (99.5 kg) IBW/kg (Calculated) : 61.5  Heparin Dosing Weight: 84   Vital Signs: Temp: 97.4 F (36.3 C) (04/02 0400) Temp src: Oral (04/02 0400) BP: 99/76 mmHg (04/02 0545) Pulse Rate: 152  (04/02 0545)  Labs:  Basename 03/27/12 0415 03/26/12 2219 03/26/12 1715  HGB 14.9 -- 16.1  HCT 45.7 -- 49.5  PLT 196 -- 201  APTT -- 31 --  LABPROT -- 15.6* --  INR -- 1.21 --  HEPARINUNFRC 1.50* -- --  CREATININE 1.52* -- 1.49*  CKTOTAL 104 109 --  CKMB 7.9* 7.4* --  TROPONINI <0.30 <0.30 0.34*   Estimated Creatinine Clearance: 54 ml/min (by C-G formula based on Cr of 1.52).   Medications:  Scheduled:    . adenosine  12 mg Intravenous Once  . aspirin EC  81 mg Oral Daily  . diltiazem  15 mg Intravenous Once  . diltiazem      . furosemide  40 mg Intravenous Once  . furosemide  40 mg Intravenous Daily  . heparin  5,000 Units Intravenous Once  . insulin aspart  0-9 Units Subcutaneous Q4H  . ipratropium  0.5 mg Nebulization Q6H  . metoprolol  5 mg Intravenous Once  . metoprolol tartrate  25 mg Oral Q6H  . nitroGLYCERIN  0.5 inch Topical Once  . nitroGLYCERIN  1 inch Topical Once  . sodium chloride  250 mL Intravenous Once  . sodium chloride  3 mL Intravenous Q12H  . DISCONTD: nitroGLYCERIN  1 inch Topical Once   Infusions:    . diltiazem (CARDIZEM) infusion 12 mg/hr (03/27/12 0543)  . heparin    . DISCONTD: heparin 1,200 Units/hr (03/27/12 0400)    Assessment: 66 yo M admitted with new aflutter.  Initial heparin level greater than goal.  Drawn appropriately from opposite arm, per RN.  Level drawn as 5h level so high level may be partial reflection of bolus.  Goal of Therapy:  Heparin level 0.3-0.7 units/ml   Plan:  1.  Hold heparin x1h 2.  Resume heparin at 1100  units/hr 3.  Check 6h heparin level after rate change. 4. Communicated changes verbally with Chip, RN.  Malakai Schoenherr L. Illene Bolus, PharmD, BCPS Clinical Pharmacist Pager: [phone] 03/27/2012 6:06 AM

## 2012-03-27 NOTE — Procedures (Signed)
Arterial Catheter Insertion Procedure Note Jakaleb Payer [id number] 07-10-1946  Procedure: Insertion of Arterial Catheter  Indications: Blood pressure monitoring  Procedure Details Consent: Unable to obtain consent because of altered level of consciousness. Time Out: Verified patient identification, verified procedure, site/side was marked, verified correct patient position, special equipment/implants available, medications/allergies/relevent history reviewed, required imaging and test results available.  Performed  Maximum sterile technique was used including antiseptics, cap, gloves, gown, hand hygiene, mask and sheet. Skin prep: Chlorhexidine; local anesthetic administered 20 gauge catheter was inserted into right radial artery using the Seldinger technique.  Evaluation Blood flow good; BP tracing good. Complications: No apparent complications.  [name] 03/27/2012

## 2012-03-27 NOTE — Significant Event (Signed)
Rapid Response Event Note Called by bedside RN for rapid HR and SOB Overview: Time Called: 0242 Arrival Time: 0300 Event Type: Cardiac  Initial Focused Assessment: Upon arrival, patient alert and oriented, denies pain, short of breath and HR in the 70s-80s, patient's HR increased to 150s  Interventions: Initiated NS bolus, inserted additional PIV, breathing treatment given by RT, supported bedside RN with transfer to 2313  Event Summary: Name of Physician Notified: Dr. Jenne Campus at [phone]    at    Outcome: Transferred (Comment) (tx 2313)  Event End Time: 0330  Burna Forts, Isidoro Donning

## 2012-03-27 NOTE — Progress Notes (Signed)
ANTICOAGULATION CONSULT NOTE - Follow Up Consult  Pharmacy Consult for heparin Indication: atrial fibrillation  No Known Allergies  Patient Measurements: Height: 5\' 5"  (165.1 cm) Weight: 219 lb 5.7 oz (99.5 kg) IBW/kg (Calculated) : 61.5  Heparin Dosing Weight: 84   Vital Signs: Temp: 97.9 F (36.6 C) (04/02 2000) Temp src: Oral (04/02 2000) BP: 108/78 mmHg (04/02 2330) Pulse Rate: 79  (04/02 2330)  Labs:  Basename 03/27/12 2300 03/27/12 2018 03/27/12 1300 03/27/12 1213 03/27/12 0415 03/26/12 2219 03/26/12 1715  HGB -- -- -- -- 14.9 -- 16.1  HCT -- -- -- -- 45.7 -- 49.5  PLT -- -- -- -- 196 -- 201  APTT -- -- -- -- -- 31 --  LABPROT -- -- -- -- -- 15.6* --  INR -- -- -- -- -- 1.21 --  HEPARINUNFRC 1.34* 1.34* -- 1.00* -- -- --  CREATININE -- -- -- -- 1.52* -- 1.49*  CKTOTAL -- -- 96 -- 104 109 --  CKMB -- -- 7.3* -- 7.9* 7.4* --  TROPONINI -- -- <0.30 -- <0.30 <0.30 --   Estimated Creatinine Clearance: 54 ml/min (by C-G formula based on Cr of 1.52).   Medications:  Scheduled:     . amiodarone  150 mg Intravenous Once  . antiseptic oral rinse  15 mL Mouth Rinse QID  . aspirin EC  81 mg Oral Daily  . chlorhexidine  15 mL Mouth Rinse BID  . EPINEPHrine      . etomidate  5 mg Intravenous Once  . fentaNYL  200 mcg Intravenous Once  . folic acid  1 mg Oral Daily  . insulin aspart  0-9 Units Subcutaneous Q4H  . ipratropium  0.5 mg Nebulization Q6H  . levalbuterol  0.63 mg Nebulization Q6H  . metoprolol  5 mg Intravenous Once  . metoprolol tartrate  25 mg Oral Q6H  . midazolam  4 mg Intravenous Once  . mulitivitamin with minerals  1 tablet Oral Daily  . pantoprazole sodium  40 mg Per Tube Q1200  . piperacillin-tazobactam (ZOSYN)  IV  3.375 g Intravenous Q8H  . rocuronium  0.6 mg/kg Intravenous Once  . sodium chloride  250 mL Intravenous Once  . sodium chloride  3 mL Intravenous Q12H  . thiamine  100 mg Oral Daily   Or  . thiamine  100 mg Intravenous Daily  .  vancomycin  1,500 mg Intravenous Q24H  . vancomycin  2,000 mg Intravenous Once  . DISCONTD: furosemide  40 mg Intravenous Daily  . DISCONTD: furosemide  40 mg Intravenous BID   Infusions:     . amiodarone (NEXTERONE PREMIX) 360 mg/200 mL dextrose Stopped (03/27/12 1030)   And  . amiodarone (NEXTERONE PREMIX) 360 mg/200 mL dextrose Stopped (03/27/12 1607)  . diltiazem (CARDIZEM) infusion Stopped (03/27/12 0955)  . DOPamine Stopped (03/27/12 1732)  . fentaNYL infusion INTRAVENOUS 50 mcg/hr (03/27/12 2000)  . heparin 1,100 Units/hr (03/27/12 2000)  . midazolam (VERSED) infusion 2 mg/hr (03/27/12 2000)  . norepinephrine (LEVOPHED) Adult infusion 2 mcg/min (03/27/12 2300)  . DISCONTD: heparin Stopped (03/27/12 5621)  . DISCONTD: norepinephrine (LEVOPHED) Adult infusion 10 mcg/min (03/27/12 1200)    Assessment: 66 yo M admitted with aflutter.  Heparin level (1.34) is above-goal on 1100 units/hr. Per RN, lab drawn from opposite arm from heparin infusion.    Goal of Therapy:  Heparin level 0.3-0.7 units/ml   Plan:  1.  Hold heparin x 1 hour 2.  Restart IV heparin infusion at 850 units/hr.  3.  Heparin level in 6 hours after restart.   Lorre Munroe, PharmD. 03/27/2012 11:59 PM

## 2012-03-27 NOTE — Progress Notes (Signed)
Inpatient Diabetes Program Recommendations  AACE/ADA: New Consensus Statement on Inpatient Glycemic Control  Target Ranges:  Prepandial:   less than 140 mg/dL      Peak postprandial:   less than 180 mg/dL (1-2 hours)      Critically ill patients:  140 - 180 mg/dL  Pager:  [phone] Hours:  8 am-10pm   Reason for Visit: Elevated glucose:  217 mg/dL  Inpatient Diabetes Program Recommendations HgbA1C: Check HgbA1C to assess glycemic control

## 2012-03-27 NOTE — Progress Notes (Signed)
1610- Notified Dr.Wall of pt c/o sob, breathing treatment was given recently prior to amiodarone bolus, pt's HR afib 126, pt diaphoretic, denies pain, cardizem gtt was stopped d/t decreased bp, pt remains uncomfortable, no new orders received, will cont to monitor pt.   1000- Dr.Wall at bedside

## 2012-03-27 NOTE — Procedures (Signed)
Central Venous Catheter Insertion Procedure Note Martin [name] [id number] 1946-02-29  Procedure: Insertion of Central Venous Catheter Indications: Assessment of intravascular volume  Procedure Details Consent: Risks of procedure as well as the alternatives and risks of each were explained to the (patient/caregiver).  Consent for procedure obtained. Time Out: Verified patient identification, verified procedure, site/side was marked, verified correct patient position, special equipment/implants available, medications/allergies/relevent history reviewed, required imaging and test results available.  Performed  Maximum sterile technique was used including antiseptics, cap, gloves, gown, hand hygiene, mask and sheet. Skin prep: Chlorhexidine; local anesthetic administered A antimicrobial bonded/coated triple lumen catheter was placed in the left internal jugular vein using the Seldinger technique. Ultrasound guidance used.yes Catheter placed to 20 cm. Blood aspirated via all 3 ports and then flushed x 3. Line sutured x 2 and dressing applied.  Evaluation Blood flow good Complications: No apparent complications Patient did tolerate procedure well. Chest X-ray ordered to verify placement.  CXR: pending.  Brett Canales Minor ACNP Adolph Pollack PCCM Pager [phone] till 3 pm If no answer page [phone] 03/27/2012, 11:07 AM

## 2012-03-27 NOTE — Progress Notes (Signed)
Patient Name: Martin [name] Date of Encounter: 03/27/2012   Principal Problem:  *CHF (congestive heart failure) Active Problems:  Atrial fibrillation with rapid ventricular response   SUBJECTIVE: No chest pain, breathing better than last pm. No awareness of irregular or rapid heart rate.   OBJECTIVE  Filed Vitals:   03/27/12 0545 03/27/12 0600 03/27/12 0615 03/27/12 0630  BP: 99/76 87/46 101/84 85/70  Pulse: 152 136 129 150  Temp:      TempSrc:      Resp: 37 30 25 30   Height:      Weight:      SpO2: 100% 98% 98% 98%    Weight change:  Wt Readings from Last 3 Encounters:  03/27/12 219 lb 5.7 oz (99.5 kg)     PHYSICAL EXAM General: Well developed, well nourished, male, dyspnea with minimal exertion. Head: Normocephalic, atraumatic.  Neck: Supple without bruits, JVD at 10 cm. Lungs:  Resp regular and slightly labored, decreased breath sounds bases with exp wheeze. Heart: rapid irregular rate and rhythm, S1, S2, no S3, S4, or murmurs. Abdomen: Soft, non-tender, non-distended, BS + x 4.  Extremities: No clubbing, cyanosis, 3+ edema.  Neuro: Alert and oriented X 3. Moves all extremities spontaneously. Psych: Normal affect.  LABS:  CBC: Basename 03/27/12 0415 03/26/12 1715  WBC 9.3 9.3  NEUTROABS -- 7.3  HGB 14.9 16.1  HCT 45.7 49.5  MCV 90.7 90.8  PLT 196 201   INR: Basename 03/26/12 2219  INR 1.21   Basic Metabolic Panel: Basename 03/27/12 0415 03/26/12 2219 03/26/12 1715  NA 140 -- 142  K 4.3 -- 3.8  CL 105 -- 103  CO2 23 -- 22  GLUCOSE 176* -- 162*  BUN 30* -- 26*  CREATININE 1.52* -- 1.49*  CALCIUM 9.0 -- 9.5  MG -- 1.8 --  PHOS -- -- --    BNP PENDING  Liver Function Tests: [facility] 03/26/12 1715  AST 91*  ALT 156*  ALKPHOS 116  BILITOT 1.1  PROT 7.3  ALBUMIN 3.4*    Cardiac Enzymes: Basename 03/27/12 0415 03/26/12 2219 03/26/12 1715  CKTOTAL 104 109 --  CKMB 7.9* 7.4* --  CKMBINDEX -- -- --  TROPONINI <0.30 <0.30 0.34*    TELE:  afib RVR with occ PVCs      Radiology/Studies: Dg Chest Port 1 View 03/26/2012  *RADIOLOGY REPORT*  Clinical Data: Shortness of breath and hypertension.  PORTABLE CHEST - 1 VIEW  Comparison: None.  Findings: The heart is enlarged.  The mediastinal and hilar contours are prominent.  Large area of opacification involving the right lung may be a combination of effusion and atelectasis or infiltrate.  There is central vascular congestion without overt pulmonary edema.  No left-sided pleural effusion.  IMPRESSION:  1.  Cardiac enlargement and prominent mediastinal and hilar contours. 2.  Right lung process possibly a combination of effusion and atelectasis and/or infiltrate.  Original Report Authenticated By: P. Loralie Champagne, M.D.    Current Medications:    . adenosine  12 mg Intravenous Once  . aspirin EC  81 mg Oral Daily  . diltiazem  15 mg Intravenous Once  . diltiazem      . furosemide  40 mg Intravenous Once  . furosemide  40 mg Intravenous Daily  . heparin  5,000 Units Intravenous Once  . insulin aspart  0-9 Units Subcutaneous Q4H  . ipratropium  0.5 mg Nebulization Q6H  . metoprolol  5 mg Intravenous Once  . metoprolol tartrate  25  mg Oral Q6H  . nitroGLYCERIN  0.5 inch Topical Once  . nitroGLYCERIN  1 inch Topical Once  . sodium chloride  250 mL Intravenous Once  . sodium chloride  3 mL Intravenous Q12H  . DISCONTD: nitroGLYCERIN  1 inch Topical Once    ASSESSMENT AND PLAN: Principal Problem:  *CHF (congestive heart failure) - Lasix to be given at 10 am, I/O incomplete but good UOP per pt. Give Lasix this am and track I/O plus wts and renal function, ck echo  Active Problems:  Atrial fibrillation with rapid ventricular response - cardizem at 15mg /hr not effective. IV Lopressor worked but BP decreased. Consider amio +/- wean cardizem and use BB. Had Lopressor 25 mg at 4 am, no change so far.  Hx ETOH use - PRN Ativan  Elevated CKMB - no crescendo/decrescendo pattern  and troponin not elevated. No chest pain. Likely secondary to CHF, afib. Follow, ck echo   Signed, Theodore Demark , PA-C 6:50 AM 03/27/2012 Patient examined and agree as discussed with Barrett PA-C and patient. Alcohol withdrawal prophylactic orders written.  Valera Castle, MD 03/27/2012 9:28 AM

## 2012-03-27 NOTE — Progress Notes (Signed)
MEDICATION RELATED CONSULT NOTE - FOLLOW UP   Pharmacy Consult for Vancomycin/Zosyn/Heparin Indication: rule out pnemonia/afib  No Known Allergies  Patient Measurements: Height: 5\' 5"  (165.1 cm) Weight: 219 lb 5.7 oz (99.5 kg) IBW/kg (Calculated) : 61.5   Vital Signs: Temp: 97.1 F (36.2 C) (04/02 0805) Temp src: Oral (04/02 0805) BP: 118/88 mmHg (04/02 1315) Pulse Rate: 77  (04/02 1315) Intake/Output from previous day: 04/01 0701 - 04/02 0700 In: 521.8 [P.O.:120; I.V.:401.8] Out: 150 [Urine:150] Intake/Output from this shift: Total I/O In: 905.2 [I.V.:351.2; IV Piggyback:554] Out: -   Labs:  Basename 03/27/12 0415 03/26/12 2219 03/26/12 1715  WBC 9.3 -- 9.3  HGB 14.9 -- 16.1  HCT 45.7 -- 49.5  PLT 196 -- 201  APTT -- 31 --  CREATININE 1.52* -- 1.49*  LABCREA -- -- --  CREATININE 1.52* -- 1.49*  CREAT24HRUR -- -- --  MG -- 1.8 --  PHOS -- -- --  ALBUMIN -- -- 3.4*  PROT -- -- 7.3  ALBUMIN -- -- 3.4*  AST -- -- 91*  ALT -- -- 156*  ALKPHOS -- -- 116  BILITOT -- -- 1.1  BILIDIR -- -- --  IBILI -- -- --   Estimated Creatinine Clearance: 54 ml/min (by C-G formula based on Cr of 1.52).   Microbiology: Recent Results (from the past 720 hour(s))  MRSA PCR SCREENING     Status: Normal   Collection Time   03/26/12  9:44 PM      Component Value Range Status Comment   MRSA by PCR NEGATIVE  NEGATIVE  Final     Assessment: 66 yo M presented to [facility] with complaints of SOB and cough, found to be in afib with RVR 4/1.  Now hypotensive, intubated, and to begin broad spectrum antibiotics for possible pneumonia.  Pt ~100 kg, with normalized crcl ~50 ml/min.  Heparin was shut off prior to intubation and was off ~2 hours.  Heparin level this AM was supratherapeutic and had been decreased.  Heparin level was drawn after intubation and heparin had just been restarted, and per RN likely was drawn through same line that heparin had been infusing in (since would expect heparin  level to be undetectable or subtherapeutic since it had been turned off).  No bleeding noted.  Goal of Therapy:  Vancomycin trough level 15-20 mcg/ml Heparin level 0.3-0.7  Plan:  1.  Vancomycin 2000 mg IV x 1, then 1500 mg IV q24hr 2.  Zosyn 3.375 gm IV q 8hr (each dose over 4 hours) 3.  F/U cultures, renal function 4.  Continue heparin at 1100 units/hr 5.  Check 6 hour heparin level from time it was turned back on  Martin [name], [place].D., BCPS Clinical Pharmacist Pager: [phone]

## 2012-03-27 NOTE — Progress Notes (Signed)
eLink Physician-Brief Progress Note Patient Name: [name] DOB: Jan 25, 1946 MRN: [id number]  Date of Service  03/27/2012   HPI/Events of Note     eICU Interventions  protonix for SUP   Intervention Category Intermediate Interventions: Best-practice therapies (e.g. DVT, beta blocker, etc.)  Adalyna Godbee V. 03/27/2012, 6:02 PM

## 2012-03-28 ENCOUNTER — Inpatient Hospital Stay (HOSPITAL_COMMUNITY): Payer: Medicaid Other

## 2012-03-28 LAB — CBC
Hemoglobin: 13.7 g/dL (ref 13.0–17.0)
MCHC: 33.9 g/dL (ref 30.0–36.0)
RDW: 14.7 % (ref 11.5–15.5)
WBC: 13.2 10*3/uL — ABNORMAL HIGH (ref 4.0–10.5)

## 2012-03-28 LAB — BASIC METABOLIC PANEL
CO2: 18 mEq/L — ABNORMAL LOW (ref 19–32)
Calcium: 8.4 mg/dL (ref 8.4–10.5)
Creatinine, Ser: 2.3 mg/dL — ABNORMAL HIGH (ref 0.50–1.35)
GFR calc Af Amer: 33 mL/min — ABNORMAL LOW (ref >90)

## 2012-03-28 LAB — PROTIME-INR
INR: 1.34 (ref 0.00–1.49)
Prothrombin Time: 16.8 seconds — ABNORMAL HIGH (ref 11.6–15.2)

## 2012-03-28 LAB — HEPATITIS PANEL, ACUTE
HCV Ab: NEGATIVE
Hep A IgM: NEGATIVE
Hep B C IgM: NEGATIVE

## 2012-03-28 LAB — PHOSPHORUS: Phosphorus: 5 mg/dL — ABNORMAL HIGH (ref 2.3–4.6)

## 2012-03-28 LAB — GLUCOSE, CAPILLARY
Glucose-Capillary: 83 mg/dL (ref 70–99)
Glucose-Capillary: 111 mg/dL — ABNORMAL HIGH (ref 70–99)

## 2012-03-28 LAB — HEPARIN LEVEL (UNFRACTIONATED)
Heparin Unfractionated: 0.67 IU/mL (ref 0.30–0.70)
Heparin Unfractionated: 0.83 IU/mL — ABNORMAL HIGH (ref 0.30–0.70)
Heparin Unfractionated: 0.37 IU/mL (ref 0.30–0.70)

## 2012-03-28 LAB — POCT I-STAT 3, ART BLOOD GAS (G3+)
Acid-base deficit: 3 mmol/L — ABNORMAL HIGH (ref 0.0–2.0)
Bicarbonate: 19.9 mEq/L — ABNORMAL LOW (ref 20.0–24.0)
O2 Saturation: 100 %
TCO2: 21 mmol/L (ref 0–100)
pO2, Arterial: 180 mmHg — ABNORMAL HIGH (ref 80.0–100.0)

## 2012-03-28 LAB — LEGIONELLA ANTIGEN, URINE

## 2012-03-28 LAB — MAGNESIUM: Magnesium: 1.6 mg/dL (ref 1.5–2.5)

## 2012-03-28 MED ORDER — INSULIN ASPART 100 UNIT/ML ~~LOC~~ SOLN: 1.0000 [IU] | SUBCUTANEOUS | Status: DC | PRN

## 2012-03-28 MED ORDER — HEPARIN (PORCINE) IN NACL 100-0.45 UNIT/ML-% IJ SOLN
800.0000 [IU]/h | INTRAMUSCULAR | Status: DC
  Administered 2012-03-28: 650 [IU]/h via INTRAVENOUS
  Administered 2012-03-28: 850 [IU]/h via INTRAVENOUS
  Administered 2012-03-31: 800 [IU]/h via INTRAVENOUS
  Filled 2012-03-28 (×5): qty 250

## 2012-03-28 MED ORDER — PRO-STAT SUGAR FREE PO LIQD
30.0000 mL | Freq: Four times a day (QID) | ORAL | Status: DC
  Administered 2012-03-28 – 2012-04-02 (×20): 30 mL
  Filled 2012-03-28 (×24): qty 30

## 2012-03-28 MED ORDER — POTASSIUM CHLORIDE 20 MEQ/15ML (10%) PO LIQD
ORAL | Status: AC
  Administered 2012-03-28: 40 meq
  Filled 2012-03-28: qty 30

## 2012-03-28 MED ORDER — FUROSEMIDE 10 MG/ML IJ SOLN
40.0000 mg | Freq: Four times a day (QID) | INTRAMUSCULAR | Status: AC
  Administered 2012-03-28 (×3): 40 mg via INTRAVENOUS
  Filled 2012-03-28 (×3): qty 4

## 2012-03-28 MED ORDER — POTASSIUM CHLORIDE 20 MEQ/15ML (10%) PO LIQD
40.0000 meq | Freq: Three times a day (TID) | ORAL | Status: AC
  Administered 2012-03-28 (×2): 40 meq
  Filled 2012-03-28 (×2): qty 30

## 2012-03-28 MED ORDER — PROMOTE PO LIQD
1000.0000 mL | ORAL | Status: DC
  Administered 2012-03-28 – 2012-04-01 (×6): 1000 mL
  Filled 2012-03-28 (×7): qty 1000

## 2012-03-28 NOTE — Progress Notes (Addendum)
INITIAL ADULT NUTRITION ASSESSMENT Date: 03/28/2012   Time: 9:54 AM  Reason for Assessment: Consult, Low Braden  ASSESSMENT: Male 66 y.o.  Dx: CHF (congestive heart failure)  Hx:  Past Medical History  Diagnosis Date  . Hypertension    Related Meds:     . antiseptic oral rinse  15 mL Mouth Rinse QID  . aspirin EC  81 mg Oral Daily  . chlorhexidine  15 mL Mouth Rinse BID  . EPINEPHrine      . etomidate  5 mg Intravenous Once  . fentaNYL  200 mcg Intravenous Once  . folic acid  1 mg Oral Daily  . furosemide  40 mg Intravenous Q6H  . insulin aspart  0-9 Units Subcutaneous Q4H  . ipratropium  0.5 mg Nebulization Q6H  . levalbuterol  0.63 mg Nebulization Q6H  . metoprolol tartrate  25 mg Oral Q6H  . midazolam  4 mg Intravenous Once  . mulitivitamin with minerals  1 tablet Oral Daily  . pantoprazole sodium  40 mg Per Tube Q1200  . piperacillin-tazobactam (ZOSYN)  IV  3.375 g Intravenous Q8H  . potassium chloride  40 mEq Per Tube TID  . potassium chloride      . rocuronium  0.6 mg/kg Intravenous Once  . sodium chloride  3 mL Intravenous Q12H  . thiamine  100 mg Oral Daily   Or  . thiamine  100 mg Intravenous Daily  . vancomycin  1,500 mg Intravenous Q24H  . vancomycin  2,000 mg Intravenous Once  . DISCONTD: furosemide  40 mg Intravenous BID    Ht: 5\' 5"  (165.1 cm)  Wt: 224 lb 13.9 oz (102 kg)  Ideal Wt: 61.8 kg % Ideal Wt: 165%  Usual Wt: unable to obtain % Usual Wt: ---  Body mass index is 37.42 kg/(m^2).  Food/Nutrition Related Hx: admission nutrition screen incomplete  Labs:  CMP     Component Value Date/Time   NA 144 03/28/2012 0415   K 4.1 03/28/2012 0415   CL 108 03/28/2012 0415   CO2 18* 03/28/2012 0415   GLUCOSE 130* 03/28/2012 0415   BUN 36* 03/28/2012 0415   CREATININE 2.30* 03/28/2012 0415   CALCIUM 8.4 03/28/2012 0415   PROT 7.3 03/26/2012 1715   ALBUMIN 3.4* 03/26/2012 1715   AST 91* 03/26/2012 1715   ALT 156* 03/26/2012 1715   ALKPHOS 116 03/26/2012 1715   BILITOT 1.1 03/26/2012 1715   GFRNONAA 29* 03/28/2012 0415   GFRAA 33* 03/28/2012 0415     Intake/Output Summary (Last 24 hours) at 03/28/12 0955 Last data filed at 03/28/12 0900  Gross per 24 hour  Intake 2135.43 ml  Output   1000 ml  Net 1135.43 ml    CBG (last 3)   Basename 03/28/12 0738 03/28/12 0349 03/28/12 0013  GLUCAP 107* 98 90    Diet Order: NPO  Supplements/Tube Feeding: N/A  IVF:    amiodarone (NEXTERONE PREMIX) 360 mg/200 mL dextrose Last Rate: Stopped (03/27/12 1030)  And   amiodarone (NEXTERONE PREMIX) 360 mg/200 mL dextrose Last Rate: Stopped (03/27/12 1607)  diltiazem (CARDIZEM) infusion Last Rate: Stopped (03/27/12 0955)  DOPamine Last Rate: Stopped (03/27/12 1732)  fentaNYL infusion INTRAVENOUS Last Rate: 50 mcg/hr (03/28/12 0900)  heparin Last Rate: 850 Units/hr (03/28/12 0900)  midazolam (VERSED) infusion Last Rate: 2 mg/hr (03/28/12 0900)  norepinephrine (LEVOPHED) Adult infusion Last Rate: Stopped (03/28/12 0200)  DISCONTD: heparin Last Rate: 1,100 Units/hr (03/27/12 2000)  DISCONTD: norepinephrine (LEVOPHED) Adult infusion Last Rate: 10 mcg/min (  03/27/12 1200)    Estimated Nutritional Needs:   Kcal: 1850 Protein: 110-120 gm Fluid: 1.8-1.9 L  RD unable to obtain nutrition hx from pt -- intubated and sedated; presented to [facility] ED 4/1 with SOB; [facility] to [facility] 4/1 and developed increased respiratory distress and hypotension; noted pt hx of ETOH abuse; OGT in place; at risk for skin breakdown given low braden score; RD consulted for EN initiation and management -- pt at some refeeding risk with EN initiation/advancement given ETOH abuse hx   NUTRITION DIAGNOSIS: -Inadequate oral intake (NI-2.1).  Status: Ongoing  RELATED TO: inability to eat  AS EVIDENCE BY: NPO status  MONITORING/EVALUATION(Goals): Goal: EN to provide 60-70% of estimated calorie needs (22-25 kcals/kg ideal body weight) and 100% of estimated protein needs, based on ASPEN guidelines  for permissive underfeeding in critically ill obese individuals, currently unmet Monitor: EN tolerance, respiratory status, labs, weight, I/O's  EDUCATION NEEDS: -No education needs identified at this time  INTERVENTION:  Initiate Promote formula at 20 ml/hr and increase by 10 ml every 8 hours to goal rate of 40 ml/hr with Prostat liquid protein 30 ml QID via tube to provide 1248 kcals (67% of estimated kcals), 120 gm protein (100% of estimated protein needs), 805 ml of free water  Monitor Mg, Phos, K+  RD to follow for nutrition care plan  Dietitian #: [phone]  DOCUMENTATION CODES Per approved criteria  -Obesity Unspecified    Alger Memos 03/28/2012, 9:54 AM

## 2012-03-28 NOTE — Progress Notes (Signed)
ANTICOAGULATION CONSULT NOTE - Follow Up Consult  Pharmacy Consult for heparin Indication: atrial fibrillation  No Known Allergies  Patient Measurements: Height: 5\' 5"  (165.1 cm) Weight: 224 lb 13.9 oz (102 kg) IBW/kg (Calculated) : 61.5  Heparin Dosing Weight: 84   Vital Signs: Temp: 97.7 F (36.5 C) (04/03 1200) Temp src: Oral (04/03 1200) BP: 117/88 mmHg (04/03 1300) Pulse Rate: 80  (04/03 0806)  Labs:  Basename 03/28/12 1300 03/28/12 0622 03/28/12 0415 03/27/12 2300 03/27/12 1300 03/27/12 0415 03/26/12 2219 03/26/12 1715  HGB -- -- 13.7 -- -- 14.9 -- --  HCT -- -- 40.4 -- -- 45.7 -- 49.5  PLT -- -- 157 -- -- 196 -- 201  APTT -- 102* -- -- -- -- 31 --  LABPROT -- 16.8* -- -- -- -- 15.6* --  INR -- 1.34 -- -- -- -- 1.21 --  HEPARINUNFRC 0.83* 0.67 -- 1.34* -- -- -- --  CREATININE -- -- 2.30* -- -- 1.52* -- 1.49*  CKTOTAL -- -- -- -- 96 104 109 --  CKMB -- -- -- -- 7.3* 7.9* 7.4* --  TROPONINI -- -- -- -- <0.30 <0.30 <0.30 --   Estimated Creatinine Clearance: 36.1 ml/min (by C-G formula based on Cr of 2.3).   Medications:  Scheduled:     . antiseptic oral rinse  15 mL Mouth Rinse QID  . aspirin EC  81 mg Oral Daily  . chlorhexidine  15 mL Mouth Rinse BID  . EPINEPHrine      . feeding supplement  30 mL Per Tube QID  . feeding supplement (PROMOTE)  1,000 mL Per Tube Q24H  . folic acid  1 mg Oral Daily  . furosemide  40 mg Intravenous Q6H  . insulin aspart  0-9 Units Subcutaneous Q4H  . ipratropium  0.5 mg Nebulization Q6H  . levalbuterol  0.63 mg Nebulization Q6H  . metoprolol tartrate  25 mg Oral Q6H  . mulitivitamin with minerals  1 tablet Oral Daily  . pantoprazole sodium  40 mg Per Tube Q1200  . piperacillin-tazobactam (ZOSYN)  IV  3.375 g Intravenous Q8H  . potassium chloride  40 mEq Per Tube TID  . sodium chloride  3 mL Intravenous Q12H  . thiamine  100 mg Oral Daily   Or  . thiamine  100 mg Intravenous Daily  . vancomycin  1,500 mg Intravenous Q24H    . vancomycin  2,000 mg Intravenous Once   Infusions:     . amiodarone (NEXTERONE PREMIX) 360 mg/200 mL dextrose Stopped (03/27/12 1607)  . diltiazem (CARDIZEM) infusion Stopped (03/27/12 0955)  . DOPamine Stopped (03/27/12 1732)  . fentaNYL infusion INTRAVENOUS 50 mcg/hr (03/28/12 1300)  . heparin 850 Units/hr (03/28/12 1300)  . midazolam (VERSED) infusion 2 mg/hr (03/28/12 1300)  . norepinephrine (LEVOPHED) Adult infusion Stopped (03/28/12 0200)  . DISCONTD: heparin 1,100 Units/hr (03/27/12 2000)    Assessment: 66 yo M admitted with aflutter.  Heparin level now supratherapeutic after being therapeutic this AM.  No bleeding noted per RN.   Goal of Therapy:  Heparin level 0.3-0.7 units/ml   Plan:  1.  Decrease heparin infusion to 650 units/hr.  3.  Check 6 hr heparin level  Rolland Porter, Pharm.D., BCPS Clinical Pharmacist Pager: [phone]

## 2012-03-28 NOTE — Progress Notes (Signed)
Patient ID: Martin [name], male   DOB: 21-Mar-1946, 66 y.o.   MRN: [id number] Chart reviewed. Sedated and on vent. Off Levophed and Dopamine. In NSR. No new recs. Plan per CCM>

## 2012-03-28 NOTE — Progress Notes (Signed)
   CARE MANAGEMENT NOTE 03/28/2012  Patient:  Martin [name], Martin [name]   Account Number:  [id number]  Date Initiated:  03/28/2012  Documentation initiated by:  GRAVES-BIGELOW,Nekia Maxham  Subjective/Objective Assessment:   Pt admitted with CHF/SVT. 03/27/12 pt had urgent intubation and treatment of hypotension/resp distress. Pt on vancomycin and zosyn IV-off levophed and dopamine.     Action/Plan:   Anticipated DC Date:  04/03/2012   Anticipated DC Plan:  HOME W HOME HEALTH SERVICES      DC Planning Services  CM consult      Choice offered to / List presented to:             Status of service:  In process, will continue to follow Medicare Important Message given?   (If response is "NO", the following Medicare IM given date fields will be blank) Date Medicare IM given:   Date Additional Medicare IM given:    Discharge Disposition:    Per UR Regulation:    If discussed at Long Length of Stay Meetings, dates discussed:    Comments:  03-28-12 1501 Tomi Bamberger, RN,BSN [phone] CM will continue to monitor for d/c disposition needs.

## 2012-03-28 NOTE — Progress Notes (Signed)
HPI:  66 yo AAM who presented to [facility] ED 4/1 with CC: of SOB. He developed SVT/aib rvr in ED and was tx with adenosine with`subsequent hypertension. Transferred to [facility] 4/1 and developed increased resp distress and hypotension. PCCM called to bedside 4/2 for urgent intubation and treatment of hypotension.   Antibiotics:   Vancomycin 4/2>>> Zosyn 4/2>>>  Cultures/Sepsis Markers:   4/2 sput>>  4/2 bc x 2>  4/2 urine>>   Access/Protocols:  4/2 lt i j cvl>>  4/2 OTT>>  4/2 rt rtad aline>>  Best Practice: DVT: Heparin drip. GI: Protonix.  Subjective: No events overnight, off pressors.  Physical Exam: Filed Vitals:   03/28/12 0806  BP: 125/72  Pulse: 80  Temp:   Resp: 20    Intake/Output Summary (Last 24 hours) at 03/28/12 0906 Last data filed at 03/28/12 0800  Gross per 24 hour  Intake 2106.6 ml  Output   1000 ml  Net 1106.6 ml   Vent Mode:  [-] PRVC FiO2 (%):  [30 %-100 %] 30 % Set Rate:  [20 bmp] 20 bmp Vt Set:  [490 mL] 490 mL PEEP:  [5 cmH20-5.6 cmH20] 5 cmH20 Plateau Pressure:  [24 cmH20-26 cmH20] 24 cmH20  Neuro: Sedated and intubated but arousable and follows commands Cardiac: RRR, Nl S1/S2, -M/R/G. Pulmonary: Diffuse crackles, decrease BS at the bases. GI: Soft, NT, ND and +BS. Extremities: -edema and -tenderness.  Labs: CBC    Component Value Date/Time   WBC 13.2* 03/28/2012 0415   RBC 4.68 03/28/2012 0415   HGB 13.7 03/28/2012 0415   HCT 40.4 03/28/2012 0415   PLT 157 03/28/2012 0415   MCV 86.3 03/28/2012 0415   MCH 29.3 03/28/2012 0415   MCHC 33.9 03/28/2012 0415   RDW 14.7 03/28/2012 0415   LYMPHSABS 1.0 03/26/2012 1715   MONOABS 0.9 03/26/2012 1715   EOSABS 0.0 03/26/2012 1715   BASOSABS 0.0 03/26/2012 1715   BMET    Component Value Date/Time   NA 144 03/28/2012 0415   K 4.1 03/28/2012 0415   CL 108 03/28/2012 0415   CO2 18* 03/28/2012 0415   GLUCOSE 130* 03/28/2012 0415   BUN 36* 03/28/2012 0415   CREATININE 2.30* 03/28/2012 0415   CALCIUM 8.4 03/28/2012 0415   GFRNONAA  29* 03/28/2012 0415   GFRAA 33* 03/28/2012 0415   ABG    Component Value Date/Time   PHART 7.453* 03/28/2012 0352   PCO2ART 28.4* 03/28/2012 0352   PO2ART 180.0* 03/28/2012 0352   HCO3 19.9* 03/28/2012 0352   TCO2 21 03/28/2012 0352   ACIDBASEDEF 3.0* 03/28/2012 0352   O2SAT 100.0 03/28/2012 0352    Lab 03/28/12 0415  MG 1.6   Lab Results  Component Value Date   CALCIUM 8.4 03/28/2012   PHOS 5.0* 03/28/2012   Chest Xray:   Assessment & Plan: VDRF, multifactorial with CHF, Afib RVR, ? Pulmonary infection on CxR.  -Begin PS trials today. -Bd with xopenex. -Pan cultures pending. -Broad spectrum abx  -Aggressive diureses today now that is off pressors. -SBT in AM.  Shock(cold) shock likely cardiogenic  -D/C pressors and KVO IVF. -CVP 20. -2 d echo pending. -If reenters shock will consider a PA catheter to check SVR and CI.  CHF  -Diurese as above. -Replace K.  Afib/RVR  Per cards.  Renal: history of renal insufficiency, 2.3 Cr, will replace K and diurese.  GI: Begin TF today.  Endocrine: no diabetes history but will monitor BS as TF are started and start ISS if  need be.  Stress dose steroids.  ID: unsure if infected but CXR with pleural effusion and pulmonary edema.  Will continue broad spectrum abx until cultures are final.  The patient is critically ill with multiple organ systems failure and requires high complexity decision making for assessment and support, frequent evaluation and titration of therapies, application of advanced monitoring technologies and extensive interpretation of multiple databases. Critical Care Time devoted to patient care services described in this note is 35 minutes.  [name], MD [phone]

## 2012-03-28 NOTE — Progress Notes (Signed)
ANTICOAGULATION CONSULT NOTE - Follow Up Consult  Pharmacy Consult for heparin Indication: atrial fibrillation  No Known Allergies  Patient Measurements: Height: 5\' 5"  (165.1 cm) Weight: 224 lb 13.9 oz (102 kg) IBW/kg (Calculated) : 61.5  Heparin Dosing Weight: 84   Vital Signs: Temp: 97.2 F (36.2 C) (04/03 0800) Temp src: Oral (04/03 0800) BP: 125/72 mmHg (04/03 0806) Pulse Rate: 80  (04/03 0806)  Labs:  Basename 03/28/12 0622 03/28/12 0415 03/27/12 2300 03/27/12 2018 03/27/12 1300 03/27/12 0415 03/26/12 2219 03/26/12 1715  HGB -- 13.7 -- -- -- 14.9 -- --  HCT -- 40.4 -- -- -- 45.7 -- 49.5  PLT -- 157 -- -- -- 196 -- 201  APTT 102* -- -- -- -- -- 31 --  LABPROT 16.8* -- -- -- -- -- 15.6* --  INR 1.34 -- -- -- -- -- 1.21 --  HEPARINUNFRC 0.67 -- 1.34* 1.34* -- -- -- --  CREATININE -- 2.30* -- -- -- 1.52* -- 1.49*  CKTOTAL -- -- -- -- 96 104 109 --  CKMB -- -- -- -- 7.3* 7.9* 7.4* --  TROPONINI -- -- -- -- <0.30 <0.30 <0.30 --   Estimated Creatinine Clearance: 36.1 ml/min (by C-G formula based on Cr of 2.3).   Medications:  Scheduled:     . amiodarone  150 mg Intravenous Once  . antiseptic oral rinse  15 mL Mouth Rinse QID  . aspirin EC  81 mg Oral Daily  . chlorhexidine  15 mL Mouth Rinse BID  . EPINEPHrine      . etomidate  5 mg Intravenous Once  . fentaNYL  200 mcg Intravenous Once  . folic acid  1 mg Oral Daily  . insulin aspart  0-9 Units Subcutaneous Q4H  . ipratropium  0.5 mg Nebulization Q6H  . levalbuterol  0.63 mg Nebulization Q6H  . metoprolol tartrate  25 mg Oral Q6H  . midazolam  4 mg Intravenous Once  . mulitivitamin with minerals  1 tablet Oral Daily  . pantoprazole sodium  40 mg Per Tube Q1200  . piperacillin-tazobactam (ZOSYN)  IV  3.375 g Intravenous Q8H  . rocuronium  0.6 mg/kg Intravenous Once  . sodium chloride  3 mL Intravenous Q12H  . thiamine  100 mg Oral Daily   Or  . thiamine  100 mg Intravenous Daily  . vancomycin  1,500 mg  Intravenous Q24H  . vancomycin  2,000 mg Intravenous Once  . DISCONTD: furosemide  40 mg Intravenous Daily  . DISCONTD: furosemide  40 mg Intravenous BID   Infusions:     . amiodarone (NEXTERONE PREMIX) 360 mg/200 mL dextrose Stopped (03/27/12 1030)   And  . amiodarone (NEXTERONE PREMIX) 360 mg/200 mL dextrose Stopped (03/27/12 1607)  . diltiazem (CARDIZEM) infusion Stopped (03/27/12 0955)  . DOPamine Stopped (03/27/12 1732)  . fentaNYL infusion INTRAVENOUS 50 mcg/hr (03/28/12 0800)  . heparin 850 Units/hr (03/28/12 0800)  . midazolam (VERSED) infusion 2 mg/hr (03/28/12 0800)  . norepinephrine (LEVOPHED) Adult infusion Stopped (03/28/12 0200)  . DISCONTD: heparin 1,100 Units/hr (03/27/12 2000)  . DISCONTD: norepinephrine (LEVOPHED) Adult infusion 10 mcg/min (03/27/12 1200)    Assessment: 66 yo M admitted with aflutter.  Heparin level now therapeutic (0.67) at 850 units/hr.  No bleeding noted.    Goal of Therapy:  Heparin level 0.3-0.7 units/ml   Plan:  1.  Continue heparin infusion at 850 units/hr.  3.  Confirmatory level at 1300 today   Rolland Porter, [place].D., BCPS Clinical Pharmacist Pager: [phone]

## 2012-03-28 NOTE — Progress Notes (Signed)
ANTICOAGULATION CONSULT NOTE - Follow Up Consult  Pharmacy Consult for heparin Indication: atrial fibrillation  No Known Allergies  Patient Measurements: Height: 5\' 5"  (165.1 cm) Weight: 224 lb 13.9 oz (102 kg) IBW/kg (Calculated) : 61.5  Heparin Dosing Weight: 84   Vital Signs: Temp: 98.2 F (36.8 C) (04/03 2018) Temp src: Oral (04/03 2018) BP: 93/50 mmHg (04/03 2100) Pulse Rate: 87  (04/03 2100)  Labs:  Basename 03/28/12 2055 03/28/12 1300 03/28/12 0622 03/28/12 0415 03/27/12 1300 03/27/12 0415 03/26/12 2219 03/26/12 1715  HGB -- -- -- 13.7 -- 14.9 -- --  HCT -- -- -- 40.4 -- 45.7 -- 49.5  PLT -- -- -- 157 -- 196 -- 201  APTT -- -- 102* -- -- -- 31 --  LABPROT -- -- 16.8* -- -- -- 15.6* --  INR -- -- 1.34 -- -- -- 1.21 --  HEPARINUNFRC 0.37 0.83* 0.67 -- -- -- -- --  CREATININE -- -- -- 2.30* -- 1.52* -- 1.49*  CKTOTAL -- -- -- -- 96 104 109 --  CKMB -- -- -- -- 7.3* 7.9* 7.4* --  TROPONINI -- -- -- -- <0.30 <0.30 <0.30 --   Estimated Creatinine Clearance: 36.1 ml/min (by C-G formula based on Cr of 2.3).   Medications:  Scheduled:     . antiseptic oral rinse  15 mL Mouth Rinse QID  . aspirin EC  81 mg Oral Daily  . chlorhexidine  15 mL Mouth Rinse BID  . EPINEPHrine      . feeding supplement  30 mL Per Tube QID  . feeding supplement (PROMOTE)  1,000 mL Per Tube Q24H  . folic acid  1 mg Oral Daily  . furosemide  40 mg Intravenous Q6H  . insulin aspart  0-9 Units Subcutaneous Q4H  . ipratropium  0.5 mg Nebulization Q6H  . levalbuterol  0.63 mg Nebulization Q6H  . metoprolol tartrate  25 mg Oral Q6H  . mulitivitamin with minerals  1 tablet Oral Daily  . pantoprazole sodium  40 mg Per Tube Q1200  . piperacillin-tazobactam (ZOSYN)  IV  3.375 g Intravenous Q8H  . potassium chloride  40 mEq Per Tube TID  . sodium chloride  3 mL Intravenous Q12H  . thiamine  100 mg Oral Daily   Or  . thiamine  100 mg Intravenous Daily  . vancomycin  1,500 mg Intravenous Q24H    Infusions:     . amiodarone (NEXTERONE PREMIX) 360 mg/200 mL dextrose Stopped (03/27/12 1607)  . diltiazem (CARDIZEM) infusion Stopped (03/27/12 0955)  . DOPamine Stopped (03/27/12 1732)  . fentaNYL infusion INTRAVENOUS 50 mcg/hr (03/28/12 2100)  . heparin 650 Units/hr (03/28/12 2010)  . midazolam (VERSED) infusion 2 mg/hr (03/28/12 2005)  . norepinephrine (LEVOPHED) Adult infusion Stopped (03/28/12 0200)  . DISCONTD: heparin 1,100 Units/hr (03/27/12 2000)    Assessment: 66 yo M admitted with aflutter.  Heparin level therapeutic.   Goal of Therapy:  Heparin level 0.3-0.7 units/ml   Plan:  1.  Continue heparin infusion at 650 units/hr.  2.  Check am labs. Talbert Cage, PharmD Clinical Pharmacist Pager: [phone]

## 2012-03-29 ENCOUNTER — Inpatient Hospital Stay (HOSPITAL_COMMUNITY): Payer: Medicaid Other

## 2012-03-29 DIAGNOSIS — I4892 Unspecified atrial flutter: Secondary | ICD-10-CM

## 2012-03-29 LAB — HEPATIC FUNCTION PANEL
ALT: 101 U/L — ABNORMAL HIGH (ref 0–53)
Bilirubin, Direct: 0.3 mg/dL (ref 0.0–0.3)
Indirect Bilirubin: 0.3 mg/dL (ref 0.3–0.9)

## 2012-03-29 LAB — GLUCOSE, CAPILLARY
Glucose-Capillary: 143 mg/dL — ABNORMAL HIGH (ref 70–99)
Glucose-Capillary: 212 mg/dL — ABNORMAL HIGH (ref 70–99)
Glucose-Capillary: 179 mg/dL — ABNORMAL HIGH (ref 70–99)
Glucose-Capillary: 133 mg/dL — ABNORMAL HIGH (ref 70–99)

## 2012-03-29 LAB — BASIC METABOLIC PANEL
BUN: 49 mg/dL — ABNORMAL HIGH (ref 6–23)
Calcium: 8.6 mg/dL (ref 8.4–10.5)
Creatinine, Ser: 2.81 mg/dL — ABNORMAL HIGH (ref 0.50–1.35)
GFR calc Af Amer: 26 mL/min — ABNORMAL LOW (ref >90)

## 2012-03-29 LAB — CBC
HCT: 42.1 % (ref 39.0–52.0)
MCH: 29.5 pg (ref 26.0–34.0)
MCHC: 34 g/dL (ref 30.0–36.0)
RDW: 15.1 % (ref 11.5–15.5)

## 2012-03-29 LAB — MAGNESIUM: Magnesium: 1.9 mg/dL (ref 1.5–2.5)

## 2012-03-29 LAB — HEPARIN LEVEL (UNFRACTIONATED)
Heparin Unfractionated: 0.24 IU/mL — ABNORMAL LOW (ref 0.30–0.70)
Heparin Unfractionated: 0.37 IU/mL (ref 0.30–0.70)

## 2012-03-29 MED ORDER — POTASSIUM CHLORIDE 20 MEQ/15ML (10%) PO LIQD
40.0000 meq | Freq: Three times a day (TID) | ORAL | Status: AC
  Administered 2012-03-29 (×2): 40 meq
  Filled 2012-03-29 (×2): qty 30

## 2012-03-29 MED ORDER — METOPROLOL TARTRATE 25 MG PO TABS
25.0000 mg | ORAL_TABLET | Freq: Two times a day (BID) | ORAL | Status: DC
  Filled 2012-03-29 (×3): qty 1

## 2012-03-29 MED ORDER — AMIODARONE LOAD VIA INFUSION
150.0000 mg | Freq: Once | INTRAVENOUS | Status: AC
  Administered 2012-03-29: 150 mg via INTRAVENOUS
  Filled 2012-03-29: qty 83.34

## 2012-03-29 MED ORDER — AMIODARONE HCL IN DEXTROSE 360-4.14 MG/200ML-% IV SOLN
30.0000 mg/h | INTRAVENOUS | Status: DC
  Administered 2012-03-29 – 2012-04-03 (×6): 30 mg/h via INTRAVENOUS
  Filled 2012-03-29 (×4): qty 200

## 2012-03-29 MED ORDER — FUROSEMIDE 10 MG/ML IJ SOLN
40.0000 mg | Freq: Three times a day (TID) | INTRAMUSCULAR | Status: AC
  Administered 2012-03-29 (×2): 40 mg via INTRAVENOUS
  Filled 2012-03-29 (×2): qty 4

## 2012-03-29 MED ORDER — AMIODARONE HCL IN DEXTROSE 360-4.14 MG/200ML-% IV SOLN
60.0000 mg/h | INTRAVENOUS | Status: AC
  Administered 2012-03-29: 60 mg/h via INTRAVENOUS
  Filled 2012-03-29: qty 200

## 2012-03-29 MED ORDER — MAGNESIUM SULFATE 40 MG/ML IJ SOLN
2.0000 g | Freq: Once | INTRAMUSCULAR | Status: AC
  Administered 2012-03-29: 2 g via INTRAVENOUS
  Filled 2012-03-29: qty 50

## 2012-03-29 NOTE — Progress Notes (Signed)
Patient: Martin [name] Date of Encounter: 03/29/2012, 10:16 AM Admit date: 03/26/2012     Subjective  Sedated on vent.    Objective   Telemetry: NSR currently, was PAF RVR earlier this AM & amiodarone was started Physical Exam: Filed Vitals:   03/29/12 1000  BP: 102/76  Pulse: 80  Temp: 97.9  Resp: 17   General: Well developed, well nourished, in no acute distress. Head: Normocephalic, atraumatic, sclera non-icteric, no xanthomas, nares are without discharge.  Neck: Supple, no obvious lymphadenopathy. Vascular line in place Lungs: diminished BS anteriorly, diffuse expiratory quiet wheeze. Breathing is unlabored on vent. Heart: RRR S1 S2 with soft apical SEM. No rubs or gallops.  Abdomen: Soft, non-tender,slightly distended with normoactive bowel sounds. No hepatomegaly. No rebound/guarding. No obvious abdominal masses. Msk:  Tone appear normal for age. Strength not assessed given sedation Extremities: No clubbing or cyanosis. 1+ taut edema bilaterally - legs are warm. Distal pedal pulses are 1+ and equal bilaterally. Neuro: Sedated on vent, spontaneous eye fluttering Psych:  Nonverbal    Intake/Output Summary (Last 24 hours) at 03/29/12 1016 Last data filed at 03/29/12 0900  Gross per 24 hour  Intake 2591.79 ml  Output   6504 ml  Net -3912.21 ml    Inpatient Medications:    . amiodarone  150 mg Intravenous Once  . antiseptic oral rinse  15 mL Mouth Rinse QID  . aspirin EC  81 mg Oral Daily  . chlorhexidine  15 mL Mouth Rinse BID  . feeding supplement  30 mL Per Tube QID  . feeding supplement (PROMOTE)  1,000 mL Per Tube Q24H  . folic acid  1 mg Oral Daily  . furosemide  40 mg Intravenous Q6H  . furosemide  40 mg Intravenous Q8H  . insulin aspart  0-9 Units Subcutaneous Q4H  . ipratropium  0.5 mg Nebulization Q6H  . levalbuterol  0.63 mg Nebulization Q6H  . magnesium sulfate 1 - 4 g bolus IVPB  2 g Intravenous Once  . metoprolol tartrate  25 mg Oral BID  .  mulitivitamin with minerals  1 tablet Oral Daily  . pantoprazole sodium  40 mg Per Tube Q1200  . piperacillin-tazobactam (ZOSYN)  IV  3.375 g Intravenous Q8H  . potassium chloride  40 mEq Per Tube TID  . potassium chloride  40 mEq Per Tube TID  . sodium chloride  3 mL Intravenous Q12H  . thiamine  100 mg Oral Daily   Or  . thiamine  100 mg Intravenous Daily  . vancomycin  1,500 mg Intravenous Q24H  . DISCONTD: metoprolol tartrate  25 mg Oral Q6H    Labs:  [facility] 03/29/12 0419 03/28/12 0415  NA 146* 144  K 3.5 4.1  CL 110 108  CO2 23 18*  GLUCOSE 152* 130*  BUN 49* 36*  CREATININE 2.81* 2.30*  CALCIUM 8.6 8.4  MG 1.9 1.6  PHOS 4.1 5.0*    Basename 03/26/12 1715  AST 91*  ALT 156*  ALKPHOS 116  BILITOT 1.1  PROT 7.3  ALBUMIN 3.4*     Basename 03/29/12 0419 03/28/12 0415 03/26/12 1715  WBC 9.8 13.2* --  NEUTROABS -- -- 7.3  HGB 14.3 13.7 --  HCT 42.1 40.4 --  MCV 87.0 86.3 --  PLT 147* 157 --    Basename 03/27/12 1300 03/27/12 0415 03/26/12 2219 03/26/12 1715  CKTOTAL 96 104 109 --  CKMB 7.3* 7.9* 7.4* --  TROPONINI <0.30 <0.30 <0.30 0.34*    Basename  03/26/12 2219  TSH 1.007  T4TOTAL --  T3FREE --  THYROIDAB --     Radiology/Studies:  1. Chest Port 1 View 03/29/2012  *RADIOLOGY REPORT*  Clinical Data: Endotracheal tube placement  PORTABLE CHEST - 1 VIEW  Comparison: 03/28/2012; 03/27/2012; 03/26/2012  Findings: Grossly unchanged enlarged cardiac silhouette and mediastinal contours.  Stable position of support apparatus. Likely interval increase in size of bilateral pleural effusions with corresponding increase in right mid lung and left lower lung heterogeneous opacities. The pulmonary vasculature is less distinct on the present examination.  No definite pneumothorax.  Grossly unchanged bones.  IMPRESSION: 1.  Stable positioning of support apparatus.  No pneumothorax. 2.  Likely worsening pulmonary edema with corresponding increase in bilateral pleural  effusions and bibasilar and mid lung opacities, right greater than left, possibly atelectasis.  Original Report Authenticated By: Waynard Reeds, M.D.   2. Chest Port 1 View 03/28/2012  *RADIOLOGY REPORT*  Clinical Data: Endotracheal tube evaluation.  PORTABLE CHEST - 1 VIEW  Comparison: 03/27/2012.  Findings: Tip of endotracheal tube terminates 2 cm above the carina.  Tip of left internal jugular venous catheter terminates in the lower portion of the superior vena cava.  Enteric tube is in place.  Distal portion enters stomach.  Tip is not included on image.  This no pneumothorax is evident.  Moderate cardiac silhouette enlargement is unchanged.  Mediastinal and hilar contours appear stable.  Prominence of left hilum and left main pulmonary artery segment are unchanged.  There is hazy opacity in the inferior and midportion of the right hemithorax with atelectasis, pulmonary opacity and infiltrate, and right pleural effusion without significant change.  There is elevation of the margin of the left hemidiaphragm with minimal left basilar atelectasis.  IMPRESSION: Tube and catheter positions are described above.  Stable cardiac silhouette.  There is hazy opacity in the inferior and midportion of the right hemithorax with atelectasis, pulmonary opacity and infiltrate, and right pleural effusion without significant change. There is elevation of the margin of the left hemidiaphragm with minimal left basilar atelectasis.enlargement.  Original Report Authenticated By: Crawford Givens, M.D.   3. 2D echo 03/27/12 Study Conclusions - Left ventricle: The cavity size was normal. Wall thickness was increased in a pattern of mild LVH. Systolic function was moderately to severely reduced. The estimated ejection fraction was in the range of 30% to 35%. Diffuse hypokinesis. Indeterminant diastolic function. - Aortic valve: There was no stenosis. - Mitral valve: Suspect moderate, very eccentric posteriorly-directed mitral  regurgitation. - Left atrium: The atrium was moderately dilated. - Right ventricle: The cavity size was mildly dilated. Systolic function was moderately reduced. - Right atrium: The atrium was mildly dilated. - Tricuspid valve: Peak RV-RA gradient: 29mm Hg (S). - Pulmonary arteries: PA systolic pressure 40-44 mmHg. PA diastolic pressure 20-24 mmHg. - Systemic veins: IVC measured 2.3 cm with < 50% respirophasic variation, suggesting RA pressure 11-15 MmHg. Impressions: - The patient was in atrial fibrillation. Normal LV size with mild LV hypertrophy. EF 30-35% with moderate diffuse hypokinesis. Mildly dilated RV with moderate systolic dysfunction. Moderate eccentric mitral regurgitation. Mild pulmonary hypertension.    Assessment and Plan  66 y/o M hx of HTN presented initially to [facility] c/o SOB. He developed SVT/AF-RVR and was treated with adenosine, transferred to [facility]. Developed hypotension and respiratory distress so was intubated and placed on pressors. Pressors are now off, has been diuresing but still on vent. CKMB elevated but negative troponins. EF found to be 30-35%.  1.  Acute ventilator-dependent respiratory failure, likely multifactorial in the setting of CHF, AF RVR/SVT, +/- pulmonary infection. Appreciate PCCM assistance - diuretics already ordered for today. Brisk diuresis yesterday, continue with careful attn to renal function. If his hypoxia persists, may need to think about r/o PE given RV dysfunction on 2D echo although this global dysfunction may be a result of his arrhythmia.  2. Newly diagnosed cardiomyopathy with EF 30-35% by echo 03/27/12 - possibly nonischemic given hx of prior EtOH as well as tachyarrhythmia. No ACEI/ARB 2/2 elevated Cr. May need ischemic workup down the road although with acute issues and elevated Cr he is not a candidate at present. Also has MR that will need to be followed.  3. Paroxysmal atrial fib with RVR - unclear chronicity. Currently in NSR, still having  bouts of RVR occasionally. Continue heparin for now. PCCM has added metoprolol starting tonight; watch lung status/BP with addition. Dr. Myrtis Ser ordered amiodarone this morning for maintenance of rhythm. Will add LFTs given use onto today's labs because of recently elevated transaminases.  4. Acute renal insufficiency - PCCM states has hx of; no prior labs in epic. BUN/Cr elevated today - continue to follow with diuresis.  Dr. Excell Seltzer to follow.  Signed, Ronie Spies PA-C  Patient seen, examined. Available data reviewed. Agree with findings, assessment, and plan as outlined by Ronie Spies, PA-C. Complex patient now critically-ill with respiratory failure. His renal function is acutely worse with creatinine rise from 1.5 to 2.8 mg/dL over 48 hours. Clinically he is volume-overloaded and most recent CVP is 11. I would continue IV diuresis with furosemide and IV amiodarone for treatment of atrial fib. I suspect his global cardiomyopathy is either tachycardia-mediated or related to ETOH. Appreciate pulmonary/CCM management.  Tonny Bollman, M.D. 03/29/2012 10:57 AM

## 2012-03-29 NOTE — Progress Notes (Signed)
ANTICOAGULATION CONSULT NOTE - Follow Up Consult  Pharmacy Consult for heparin Indication: atrial fibrillation  No Known Allergies  Patient Measurements: Height: 5\' 5"  (165.1 cm) Weight: 224 lb 10.4 oz (101.9 kg) (bed weight) IBW/kg (Calculated) : 61.5  Heparin Dosing Weight: 84   Vital Signs: Temp: 97.3 F (36.3 C) (04/04 1200) Temp src: Oral (04/04 1200) BP: 95/73 mmHg (04/04 1400) Pulse Rate: 85  (04/04 1400)  Labs:  Basename 03/29/12 1356 03/29/12 0419 03/28/12 2055 03/28/12 0622 03/28/12 0415 03/27/12 1300 03/27/12 0415 03/26/12 2219  HGB -- 14.3 -- -- 13.7 -- -- --  HCT -- 42.1 -- -- 40.4 -- 45.7 --  PLT -- 147* -- -- 157 -- 196 --  APTT -- -- -- 102* -- -- -- 31  LABPROT -- -- -- 16.8* -- -- -- 15.6*  INR -- -- -- 1.34 -- -- -- 1.21  HEPARINUNFRC 0.37 0.24* 0.37 -- -- -- -- --  CREATININE -- 2.81* -- -- 2.30* -- 1.52* --  CKTOTAL -- -- -- -- -- 96 104 109  CKMB -- -- -- -- -- 7.3* 7.9* 7.4*  TROPONINI -- -- -- -- -- <0.30 <0.30 <0.30   Estimated Creatinine Clearance: 29.6 ml/min (by C-G formula based on Cr of 2.81).  Assessment: 66 yo male with afib for heparin.  Heparin level therapeutic after increase.  No bleeding noted.   Goal of Therapy:  Heparin level 0.3-0.7 units/ml   Plan:  Continue heparin at 800 units/hr F/U AM heparin level, CBC  Rolland Porter, Pharm.D., BCPS Clinical Pharmacist Pager: [phone]

## 2012-03-29 NOTE — Progress Notes (Signed)
ANTICOAGULATION CONSULT NOTE - Follow Up Consult  Pharmacy Consult for heparin Indication: atrial fibrillation  No Known Allergies  Patient Measurements: Height: 5\' 5"  (165.1 cm) Weight: 224 lb 13.9 oz (102 kg) IBW/kg (Calculated) : 61.5  Heparin Dosing Weight: 84   Vital Signs: Temp: 97.7 F (36.5 C) (04/04 0400) Temp src: Oral (04/04 0400) BP: 103/73 mmHg (04/04 0400) Pulse Rate: 80  (04/04 0400)  Labs:  Basename 03/29/12 0419 03/28/12 2055 03/28/12 1300 03/28/12 0622 03/28/12 0415 03/27/12 1300 03/27/12 0415 03/26/12 2219  HGB 14.3 -- -- -- 13.7 -- -- --  HCT 42.1 -- -- -- 40.4 -- 45.7 --  PLT 147* -- -- -- 157 -- 196 --  APTT -- -- -- 102* -- -- -- 31  LABPROT -- -- -- 16.8* -- -- -- 15.6*  INR -- -- -- 1.34 -- -- -- 1.21  HEPARINUNFRC 0.24* 0.37 0.83* -- -- -- -- --  CREATININE 2.81* -- -- -- 2.30* -- 1.52* --  CKTOTAL -- -- -- -- -- 96 104 109  CKMB -- -- -- -- -- 7.3* 7.9* 7.4*  TROPONINI -- -- -- -- -- <0.30 <0.30 <0.30   Estimated Creatinine Clearance: 29.6 ml/min (by C-G formula based on Cr of 2.81).  Assessment: 66 yo male with afib for heparin.   Goal of Therapy:  Heparin level 0.3-0.7 units/ml   Plan:  Increase Heparin 800 units/hr Check heparin level in 8 hours.  Geannie Risen, PharmD, BCPS

## 2012-03-29 NOTE — Progress Notes (Signed)
Patient ID: Martin [name], male   DOB: 09/23/1946, 66 y.o.   MRN: [id number]    2D Echo 03/27/2012 showed EF 30-35%. There was diffuse hypokinesis. There was eccentric MR that was probably moderate.  Jerral Bonito, MD

## 2012-03-29 NOTE — Progress Notes (Signed)
0900- notified Dr.Katz of pt HR afib 150s, pt has been SR 80s, came in with aflutter 150s, was started on a cardizem gtt on admission with no affect, amiodarone with bolus was started 4/2 and pt did convert during intubation, aline bp 180s/100s, new orders received, will cont to monitor

## 2012-03-29 NOTE — Progress Notes (Signed)
HPI:  66 yo AAM who presented to [facility] ED 4/1 with CC: of SOB. He developed SVT/aib rvr in ED and was tx with adenosine with`subsequent hypertension. Transferred to [facility] 4/1 and developed increased resp distress and hypotension. PCCM called to bedside 4/2 for urgent intubation and treatment of hypotension.   Antibiotics:   Vancomycin 4/2>>> Zosyn 4/2>>>  Cultures/Sepsis Markers:   4/2 sput>> Neg 4/2 bc x 2> Pending 4/2 urine>> Neg  Access/Protocols:  4/2 lt i j cvl>>  4/2 OTT>>  4/2 rt rtad aline>>  Best Practice: DVT: Heparin drip. GI: Protonix.  Subjective: No events overnight, off pressors and tolerated diureses very well.  Physical Exam: Filed Vitals:   03/29/12 0700  BP: 114/79  Pulse:   Temp:   Resp: 20    Intake/Output Summary (Last 24 hours) at 03/29/12 0807 Last data filed at 03/29/12 0700  Gross per 24 hour  Intake 2589.5 ml  Output   6204 ml  Net -3614.5 ml   Vent Mode:  [-] PRVC FiO2 (%):  [29.7 %-30.3 %] 30 % Set Rate:  [20 bmp] 20 bmp Vt Set:  [490 mL] 490 mL PEEP:  [5 cmH20] 5 cmH20 Plateau Pressure:  [21 cmH20-22 cmH20] 21 cmH20  Neuro: Sedated and intubated but arousable and follows commands Cardiac: RRR, Nl S1/S2, -M/R/G. Pulmonary: Diffuse crackles, decrease BS at the bases. GI: Soft, NT, ND and +BS. Extremities: -edema and -tenderness.  Labs: CBC    Component Value Date/Time   WBC 9.8 03/29/2012 0419   RBC 4.84 03/29/2012 0419   HGB 14.3 03/29/2012 0419   HCT 42.1 03/29/2012 0419   PLT 147* 03/29/2012 0419   MCV 87.0 03/29/2012 0419   MCH 29.5 03/29/2012 0419   MCHC 34.0 03/29/2012 0419   RDW 15.1 03/29/2012 0419   LYMPHSABS 1.0 03/26/2012 1715   MONOABS 0.9 03/26/2012 1715   EOSABS 0.0 03/26/2012 1715   BASOSABS 0.0 03/26/2012 1715   BMET    Component Value Date/Time   NA 146* 03/29/2012 0419   K 3.5 03/29/2012 0419   CL 110 03/29/2012 0419   CO2 23 03/29/2012 0419   GLUCOSE 152* 03/29/2012 0419   BUN 49* 03/29/2012 0419   CREATININE 2.81* 03/29/2012 0419   CALCIUM 8.6 03/29/2012 0419   GFRNONAA 22* 03/29/2012 0419   GFRAA 26* 03/29/2012 0419   ABG    Component Value Date/Time   PHART 7.453* 03/28/2012 0352   PCO2ART 28.4* 03/28/2012 0352   PO2ART 180.0* 03/28/2012 0352   HCO3 19.9* 03/28/2012 0352   TCO2 21 03/28/2012 0352   ACIDBASEDEF 3.0* 03/28/2012 0352   O2SAT 100.0 03/28/2012 0352    Lab 03/29/12 0419  MG 1.9   Lab Results  Component Value Date   CALCIUM 8.6 03/29/2012   PHOS 4.1 03/29/2012   Chest Xray:   Assessment & Plan: VDRF, multifactorial with CHF, Afib RVR, ? Pulmonary infection on CxR.  -SBT today now that patient diuresed but will likely require additional diureses prior to extubation. -BD with xopenex. -Pan cultures pending. -Broad spectrum abx  -Aggressive diureses today again to facilitate liberation from vent. -SBT in AM again if fails.  Shock(cold) shock likely cardiogenic  -KVO IVF. -CVP remains elevated. -NGT flushes. -Additional lasix. -2 d echo pending. -If reenters shock will consider a PA catheter to check SVR and CI.  CHF  -Diurese as above. -Replace K.  Afib/RVR  Per cards.  Renal: history of renal insufficiency, 2.3 Cr, will replace K and diurese.  GI:  TF.  Endocrine: no diabetes history but will monitor BS as TF are started, cortisol level is 60 will D/C stress dose steroids.  ID: unsure if infected but CXR with pleural effusion and pulmonary edema.  Will continue broad spectrum abx until cultures are final.  The patient is critically ill with multiple organ systems failure and requires high complexity decision making for assessment and support, frequent evaluation and titration of therapies, application of advanced monitoring technologies and extensive interpretation of multiple databases. Critical Care Time devoted to patient care services described in this note is 35 minutes.  [name], MD [phone]

## 2012-03-30 ENCOUNTER — Inpatient Hospital Stay (HOSPITAL_COMMUNITY): Payer: Medicaid Other

## 2012-03-30 LAB — POCT I-STAT 3, ART BLOOD GAS (G3+)
Bicarbonate: 25.9 mEq/L — ABNORMAL HIGH (ref 20.0–24.0)
O2 Saturation: 99 %
TCO2: 27 mmol/L (ref 0–100)
pCO2 arterial: 35.5 mmHg (ref 35.0–45.0)
pO2, Arterial: 135 mmHg — ABNORMAL HIGH (ref 80.0–100.0)

## 2012-03-30 LAB — CBC
MCV: 90.6 fL (ref 78.0–100.0)
Platelets: 152 10*3/uL (ref 150–400)
RDW: 15.6 % — ABNORMAL HIGH (ref 11.5–15.5)
WBC: 9.5 10*3/uL (ref 4.0–10.5)

## 2012-03-30 LAB — GLUCOSE, CAPILLARY
Glucose-Capillary: 126 mg/dL — ABNORMAL HIGH (ref 70–99)
Glucose-Capillary: 188 mg/dL — ABNORMAL HIGH (ref 70–99)
Glucose-Capillary: 129 mg/dL — ABNORMAL HIGH (ref 70–99)

## 2012-03-30 LAB — CULTURE, RESPIRATORY W GRAM STAIN

## 2012-03-30 LAB — VANCOMYCIN, TROUGH: Vancomycin Tr: 32.4 ug/mL (ref 10.0–20.0)

## 2012-03-30 LAB — BASIC METABOLIC PANEL
BUN: 65 mg/dL — ABNORMAL HIGH (ref 6–23)
GFR calc Af Amer: 22 mL/min — ABNORMAL LOW (ref >90)
GFR calc non Af Amer: 19 mL/min — ABNORMAL LOW (ref >90)
Potassium: 4.2 mEq/L (ref 3.5–5.1)

## 2012-03-30 LAB — OCCULT BLOOD X 1 CARD TO LAB, STOOL: Fecal Occult Bld: NEGATIVE

## 2012-03-30 MED ORDER — FREE WATER
200.0000 mL | Freq: Four times a day (QID) | Status: DC
  Administered 2012-03-30 – 2012-03-31 (×4): 200 mL

## 2012-03-30 MED ORDER — AMLODIPINE 1 MG/ML ORAL SUSPENSION: 5.0000 mg | Freq: Every day | ORAL | Status: DC

## 2012-03-30 MED ORDER — DOCUSATE SODIUM 50 MG/5ML PO LIQD
100.0000 mg | Freq: Two times a day (BID) | ORAL | Status: DC
  Administered 2012-03-30 – 2012-03-31 (×2): 100 mg via ORAL
  Filled 2012-03-30 (×8): qty 10

## 2012-03-30 MED ORDER — MAGNESIUM HYDROXIDE 400 MG/5ML PO SUSP
15.0000 mL | Freq: Every day | ORAL | Status: AC
  Administered 2012-03-30: 15 mL
  Filled 2012-03-30: qty 30

## 2012-03-30 MED ORDER — AMLODIPINE BESYLATE 5 MG PO TABS
5.0000 mg | ORAL_TABLET | Freq: Every day | ORAL | Status: DC
  Administered 2012-03-30: 5 mg
  Filled 2012-03-30 (×2): qty 1

## 2012-03-30 MED ORDER — METOPROLOL TARTRATE 25 MG/10 ML ORAL SUSPENSION
50.0000 mg | Freq: Two times a day (BID) | ORAL | Status: DC
  Administered 2012-03-30 – 2012-04-02 (×7): 50 mg
  Filled 2012-03-30 (×8): qty 20

## 2012-03-30 MED ORDER — METOPROLOL TARTRATE 50 MG PO TABS
50.0000 mg | ORAL_TABLET | Freq: Two times a day (BID) | ORAL | Status: DC
  Filled 2012-03-30 (×2): qty 1

## 2012-03-30 NOTE — Progress Notes (Signed)
HPI:  66 yo AAM who presented to [facility] ED 4/1 with CC: of SOB. He developed SVT/aib rvr in ED and was tx with adenosine with`subsequent hypertension. Transferred to [facility] 4/1 and developed increased resp distress and hypotension. PCCM called to bedside 4/2 for urgent intubation and treatment of hypotension.   Antibiotics:   Vancomycin 4/2>>> Zosyn 4/2>>>  Cultures/Sepsis Markers:   4/2 sput>> Neg 4/2 bc x 2> Pending 4/2 urine>> Neg  Access/Protocols:  4/2 lt i j cvl>>  4/2 OTT>>  4/2 rt rtad aline>>  Best Practice: DVT: Heparin drip. GI: Protonix.  Subjective: No events overnight, off pressors and tolerated diureses very well.  Physical Exam: Filed Vitals:   03/30/12 0825  BP: 145/84  Pulse: 78  Temp:   Resp: 15    Intake/Output Summary (Last 24 hours) at 03/30/12 0842 Last data filed at 03/30/12 0700  Gross per 24 hour  Intake 3772.85 ml  Output   2720 ml  Net 1052.85 ml   Vent Mode:  [-] PSV FiO2 (%):  [29.8 %-40 %] 30 % Set Rate:  [20 bmp] 20 bmp Vt Set:  [490 mL] 490 mL PEEP:  [5 cmH20] 5 cmH20 Pressure Support:  [10 cmH20] 10 cmH20 Plateau Pressure:  [21 cmH20-24 cmH20] 22 cmH20  Neuro: Sedated and intubated but arousable and follows commands Cardiac: RRR, Nl S1/S2, -M/R/G. Pulmonary: Diffuse crackles, decrease BS at the bases. GI: Soft, NT, ND and +BS. Extremities: -edema and -tenderness.  Labs: CBC    Component Value Date/Time   WBC 9.5 03/30/2012 0412   RBC 4.87 03/30/2012 0412   HGB 14.3 03/30/2012 0412   HCT 44.1 03/30/2012 0412   PLT 152 03/30/2012 0412   MCV 90.6 03/30/2012 0412   MCH 29.4 03/30/2012 0412   MCHC 32.4 03/30/2012 0412   RDW 15.6* 03/30/2012 0412   LYMPHSABS 1.0 03/26/2012 1715   MONOABS 0.9 03/26/2012 1715   EOSABS 0.0 03/26/2012 1715   BASOSABS 0.0 03/26/2012 1715   BMET    Component Value Date/Time   NA 147* 03/30/2012 0412   K 4.2 03/30/2012 0412   CL 110 03/30/2012 0412   CO2 24 03/30/2012 0412   GLUCOSE 141* 03/30/2012 0412   BUN 65* 03/30/2012  0412   CREATININE 3.27* 03/30/2012 0412   CALCIUM 8.8 03/30/2012 0412   GFRNONAA 19* 03/30/2012 0412   GFRAA 22* 03/30/2012 0412   ABG    Component Value Date/Time   PHART 7.470* 03/30/2012 0409   PCO2ART 35.5 03/30/2012 0409   PO2ART 135.0* 03/30/2012 0409   HCO3 25.9* 03/30/2012 0409   TCO2 27 03/30/2012 0409   ACIDBASEDEF 3.0* 03/28/2012 0352   O2SAT 99.0 03/30/2012 0409    Lab 03/30/12 0412  MG 2.7*   Lab Results  Component Value Date   CALCIUM 8.8 03/30/2012   PHOS 4.3 03/30/2012   Chest Xray:   Assessment & Plan: VDRF, multifactorial with CHF, Afib RVR, ? Pulmonary infection on CxR.  -PS trials today but doubt extubation due to fluid overload and deteriorating renal function. -BD with xopenex. -Pan cultures pending. -Broad spectrum abx  -Hold diureses for today due to worsening renal function. -SBT in AM again if fails.  Shock(cold) shock likely cardiogenic, now actually hypertensive. -KVO IVF. -CVP remains elevated. -NGT flushes. -Hold lasix for today due to renal failure. -2 d echo EF of 30-35% with mild pulmonary HTN. -Increase lopressor to 50 BID and add norvasc 5 mg daily, will increase if remains HTN.  Renal: worsening renal  failure and hypernatremia. Plan: Hold diureses.  NGT flushes.  F/U BMET.  No additional K.  CHF  -NGT flushes and hold diureses. -Replace K  As needed.  Afib/RVR  Per cards.  Renal: history of renal insufficiency, 2.3 Cr, will replace K and diurese.  GI: TF.  Endocrine: no diabetes history but will monitor BS as TF are started, cortisol level is 60 will D/C stress dose steroids.  ID: unsure if infected but CXR with pleural effusion and pulmonary edema.  Will continue broad spectrum abx until cultures are final (urine and sputum negative, blood not yet final.  The patient is critically ill with multiple organ systems failure and requires high complexity decision making for assessment and support, frequent evaluation and titration of therapies,  application of advanced monitoring technologies and extensive interpretation of multiple databases. Critical Care Time devoted to patient care services described in this note is 35 minutes.  [name], MD [phone]

## 2012-03-30 NOTE — Progress Notes (Signed)
Patient: Martin [name] Date of Encounter: 03/30/2012, 1:26 PM Admit date: 03/26/2012     Subjective  Blood pressures high overnight prompting med titration. Cr increasing. Sedation discontinued. Pt is waking up, has paws on given awareness/agitation over vent.    Objective   Telemetry: NSR, brief non-sustained return of afib this morning. Occasional PVCs. Rates 70s-80s. Physical Exam: Filed Vitals:   03/30/12 1314  BP: 122/87  Pulse: 73  Temp: 96.5  Resp: 26   General: Well developed, well nourished, in no acute distress.  Head: Normocephalic, atraumatic, sclera non-icteric, no xanthomas, nares are without discharge.  Neck: Supple, no obvious lymphadenopathy. Vascular line in place  Lungs: diminished BS anteriorly, diffuse expiratory quiet wheeze. Breathing is unlabored on vent.  Heart: RRR S1 S2 with soft apical SEM. No rubs or gallops.  Abdomen: Soft, non-tender,slightly distended with normoactive bowel sounds. No hepatomegaly. No rebound/guarding. No obvious abdominal masses.  Msk: Tone appear normal for age. Strength not assessed given sedation  Extremities: No clubbing or cyanosis. 1+ taut edema bilaterally - legs are warm. Distal pedal pulses are 1+ and equal bilaterally.  Neuro: On vent, mildly agitated, awake Psych: Nonverbal given vent in place    Intake/Output Summary (Last 24 hours) at 03/30/12 1326 Last data filed at 03/30/12 1200  Gross per 24 hour  Intake 2925.8 ml  Output   2790 ml  Net  135.8 ml    Inpatient Medications:    . amLODipine  5 mg Per Tube Daily  . antiseptic oral rinse  15 mL Mouth Rinse QID  . aspirin EC  81 mg Oral Daily  . chlorhexidine  15 mL Mouth Rinse BID  . docusate  100 mg Oral BID  . feeding supplement  30 mL Per Tube QID  . feeding supplement (PROMOTE)  1,000 mL Per Tube Q24H  . folic acid  1 mg Oral Daily  . free water  200 mL Per Tube Q6H  . furosemide  40 mg Intravenous Q8H  . insulin aspart  0-9 Units Subcutaneous Q4H  .  ipratropium  0.5 mg Nebulization Q6H  . levalbuterol  0.63 mg Nebulization Q6H  . magnesium hydroxide  15 mL Per Tube Daily  . metoprolol tartrate  50 mg Per Tube BID  . mulitivitamin with minerals  1 tablet Oral Daily  . pantoprazole sodium  40 mg Per Tube Q1200  . piperacillin-tazobactam (ZOSYN)  IV  3.375 g Intravenous Q8H  . potassium chloride  40 mEq Per Tube TID  . sodium chloride  3 mL Intravenous Q12H  . thiamine  100 mg Oral Daily   Or  . thiamine  100 mg Intravenous Daily  . DISCONTD: amLODipine  5 mg Per Tube Daily  . DISCONTD: metoprolol tartrate  50 mg Oral BID  . DISCONTD: metoprolol tartrate  25 mg Oral BID  . DISCONTD: vancomycin  1,500 mg Intravenous Q24H    Labs:  [facility] 03/30/12 0412 03/29/12 0419  NA 147* 146*  K 4.2 3.5  CL 110 110  CO2 24 23  GLUCOSE 141* 152*  BUN 65* 49*  CREATININE 3.27* 2.81*  CALCIUM 8.8 8.6  MG 2.7* 1.9  PHOS 4.3 4.1    Basename 03/29/12 1430  AST 42*  ALT 101*  ALKPHOS 104  BILITOT 0.6  PROT 6.5  ALBUMIN 2.6*    Basename 03/30/12 0412 03/29/12 0419  WBC 9.5 9.8  NEUTROABS -- --  HGB 14.3 14.3  HCT 44.1 42.1  MCV 90.6 87.0  PLT  152 147*    Radiology/Studies:  1. Chest Port 1 View 03/30/2012  *RADIOLOGY REPORT*  Clinical Data: Ventilator.  PORTABLE CHEST - 1 VIEW  Comparison: 03/29/2012  Findings: Cardiomegaly with vascular congestion.  Low lung volumes with bilateral airspace opacities, right greater than left.  This could represent asymmetric edema or infection.  Small bilateral effusions.  No real change.  IMPRESSION: No significant interval change.  Original Report Authenticated By: Cyndie Chime, M.D.   Assessment and Plan  66 y/o M hx of HTN presented initially to [facility] c/o SOB. He developed SVT/AF-RVR and was treated with adenosine, transferred to [facility]. Developed hypotension and respiratory distress so was intubated and placed on pressors. Pressors are now off, has been diuresing but still on vent. CKMB elevated  but negative troponins. EF found to be 30-35%.   1. Acute ventilator-dependent respiratory failure, likely multifactorial in the setting of CHF, AF RVR/SVT, +/- pulmonary infection. Appreciate PCCM assistance - CVP 20 yesterday with attempts to diurese but renal function bumped today so Lasix on hold. If his hypoxia persists, may need to think about r/o PE given RV dysfunction on 2D echo although this global dysfunction may be a result of his arrhythmia.  2. Newly diagnosed cardiomyopathy with EF 30-35% by echo 03/27/12 - possibly nonischemic given hx of prior EtOH as well as tachyarrhythmia. No ACEI/ARB 2/2 elevated Cr. Will need ischemic workup down the road although with acute issues and elevated Cr he is not a candidate at present. Also has MR that will need to be followed.   3. Paroxysmal atrial fib with RVR - unclear chronicity. Currently in NSR - amiodarone started yesterday due to recurrence, currently going at 30mg /hour - will continue for now. Continue heparin for now. PCCM has added metoprolol; watch lung status/BP with addition.  4. Acute renal insufficiency - PCCM states has hx of; no prior labs in epic. BUN/Cr continue to rise therefore diuretics on hold. Consider renal consult.  5. Shock - possibly cardiogenic vs infection. Currently off pressors. Hypothermia noted although this is ax temp, currently being tx with broad spectrum abx. Norvasc added, metoprolol uptitrated per PCCM for hypertension.  Signed, Ronie Spies PA-C  Patient seen and examined. I agree with the assessment and plan as detailed above. See also my additional thoughts below.   Patient is seen and examined. I participated in the writing of the note above. I feel it would be most prudent to continue IV amiodarone for now. This can be stopped when the patient is more stable. We do not know the etiology of his LV dysfunction.  It will be helpful to do an early followup echo in the hospital to see if there is improvement  after his stabilization. He is total body salt and fluid overloaded. However he cannot yet be diuresis. I suspect his renal function is due to ATN and hopefully will improve. Then he can be diuresis.  Willa Rough, MD, [facility] 03/30/2012 2:28 PM

## 2012-03-30 NOTE — Progress Notes (Signed)
ANTICOAGULATION & ANTIBIOTIC CONSULT NOTE - Follow Up Consult  Pharmacy Consult for Heparin and Vancomycin + Zosyn Indication: Afib and empiric PNA/shock coverage  No Known Allergies  Patient Measurements: Height: 5\' 5"  (165.1 cm) Weight: 224 lb 10.4 oz (101.9 kg) (bed weight) IBW/kg (Calculated) : 61.5  Heparin Dosing Weight: 84 kg  Vital Signs: Temp: 96.5 F (35.8 C) (04/05 1234) Temp src: Axillary (04/05 1234) BP: 130/83 mmHg (04/05 1136) Pulse Rate: 68  (04/05 1136)  Intake/Output from previous day: 04/04 0701 - 04/05 0700 In: 3883.1 [I.V.:1813.1; NG/GT:1370; IV Piggyback:700] Out: 3020 [Urine:3020]  Intake/Output from this shift: Total I/O In: 698.5 [I.V.:298.5; NG/GT:400] Out: 265 [Urine:265]   Labs:  Basename 03/30/12 0412 03/29/12 1356 03/29/12 0419 03/28/12 0622 03/28/12 0415 03/27/12 1300  HGB 14.3 -- 14.3 -- -- --  HCT 44.1 -- 42.1 -- 40.4 --  PLT 152 -- 147* -- 157 --  APTT -- -- -- 102* -- --  LABPROT -- -- -- 16.8* -- --  INR -- -- -- 1.34 -- --  HEPARINUNFRC 0.41 0.37 0.24* -- -- --  CREATININE 3.27* -- 2.81* -- 2.30* --  CKTOTAL -- -- -- -- -- 96  CKMB -- -- -- -- -- 7.3*  TROPONINI -- -- -- -- -- <0.30   Estimated Creatinine Clearance: 25.4 ml/min (by C-G formula based on Cr of 3.27).   Basename 03/30/12 1130  VANCOTROUGH 32.4*  VANCOPEAK --  Drue Dun --  GENTTROUGH --  GENTPEAK --  GENTRANDOM --  TOBRATROUGH --  Nolen Mu --  TOBRARND --  AMIKACINPEAK --  AMIKACINTROU --  AMIKACIN --    Medications:  Anti-infectives     Start     Dose/Rate Route Frequency Ordered Stop   03/28/12 1200   vancomycin (VANCOCIN) 1,500 mg in sodium chloride 0.9 % 500 mL IVPB        1,500 mg 250 mL/hr over 120 Minutes Intravenous Every 24 hours 03/27/12 1302     03/27/12 1200   vancomycin (VANCOCIN) 2,000 mg in sodium chloride 0.9 % 500 mL IVPB        2,000 mg 250 mL/hr over 120 Minutes Intravenous  Once 03/27/12 1154 03/27/12 1417   03/27/12  1200   piperacillin-tazobactam (ZOSYN) IVPB 3.375 g        3.375 g 12.5 mL/hr over 240 Minutes Intravenous Every 8 hours 03/27/12 1154            Assessment: 66 y.o. M on heparin for Afib with a therapeutic heparin level this a.m. (HL 0.41, goal of 0.3-0.7). Hgb/Hct/Plt stable. No s/sx of bleeding noted. Heparin infusing at appropriate rate.  The patient is also on Vancomycin and Zosyn for empiric PNA/shock coverage. Renal function has been worsening over the past 4 days and is now double the patient's admission value. A Vancomycin trough drawn this morning was elevated (Vanc trough 32.4, goal of 15-20). The patient will likely only require Vancomycin every 48 hours however will recheck another random level on 4/6 to ensure cleared before resuming treatment in the setting of worsening renal function.   Goal of Therapy:  Heparin level 0.3-0.7 units/ml Vancomycin trough of 15-20 mcg/mL   Plan:  1. Continue heparin at current rate of 800 units/hr (8 ml/hr) 2. Hold Vancomycin dose 3. Will recheck a random level on 4/6 and re-dose at that time.  4. Continue Zosyn 3.375g IV every 8 hours (will adjust if CrCl falls <20 ml/min) 5. Will continue to monitor for any signs/symptoms of bleeding and will  follow up with heparin level in the a.m.  6. Will continue to follow renal function, culture results, LOT, and antibiotic de-escalation plans   Georgina Pillion, PharmD, BCPS Clinical Pharmacist Pager: [phone] 03/30/2012 1:09 PM

## 2012-03-30 NOTE — Progress Notes (Signed)
CRITICAL VALUE ALERT  Critical value received: vancomycin trough 32.4  Date of notification:  03/30/2012  Time of notification:  1200  Critical value read back:yes  Nurse who received alert:  Acey Lav  MD notified (1st page):  Pharmacy 2nd floor per vancomycin dosing protocol  Time of first page:  *1215  MD notified (2nd page):  Time of second page:  Responding MD:  2nd floor pharmacy  Time MD responded:  1210

## 2012-03-31 ENCOUNTER — Inpatient Hospital Stay (HOSPITAL_COMMUNITY): Payer: Medicaid Other

## 2012-03-31 LAB — GLUCOSE, CAPILLARY
Glucose-Capillary: 133 mg/dL — ABNORMAL HIGH (ref 70–99)
Glucose-Capillary: 174 mg/dL — ABNORMAL HIGH (ref 70–99)

## 2012-03-31 LAB — POCT I-STAT 3, ART BLOOD GAS (G3+)
Bicarbonate: 26.9 mEq/L — ABNORMAL HIGH (ref 20.0–24.0)
Patient temperature: 98.7
pH, Arterial: 7.505 — ABNORMAL HIGH (ref 7.350–7.450)

## 2012-03-31 LAB — CBC
MCH: 29.2 pg (ref 26.0–34.0)
MCHC: 31.9 g/dL (ref 30.0–36.0)
MCV: 91.4 fL (ref 78.0–100.0)
Platelets: 150 10*3/uL (ref 150–400)

## 2012-03-31 LAB — BASIC METABOLIC PANEL
BUN: 73 mg/dL — ABNORMAL HIGH (ref 6–23)
Calcium: 8.8 mg/dL (ref 8.4–10.5)
Creatinine, Ser: 3.07 mg/dL — ABNORMAL HIGH (ref 0.50–1.35)
GFR calc Af Amer: 23 mL/min — ABNORMAL LOW (ref >90)

## 2012-03-31 LAB — HEPARIN LEVEL (UNFRACTIONATED): Heparin Unfractionated: 0.3 IU/mL (ref 0.30–0.70)

## 2012-03-31 LAB — MAGNESIUM: Magnesium: 2.9 mg/dL — ABNORMAL HIGH (ref 1.5–2.5)

## 2012-03-31 MED ORDER — FREE WATER
300.0000 mL | Freq: Four times a day (QID) | Status: DC
  Administered 2012-03-31 – 2012-04-02 (×8): 300 mL

## 2012-03-31 MED ORDER — AMLODIPINE BESYLATE 10 MG PO TABS
10.0000 mg | ORAL_TABLET | Freq: Every day | ORAL | Status: DC
  Administered 2012-03-31 – 2012-04-07 (×7): 10 mg
  Filled 2012-03-31 (×9): qty 1

## 2012-03-31 MED ORDER — VANCOMYCIN HCL 1000 MG IV SOLR
1500.0000 mg | INTRAVENOUS | Status: DC
  Administered 2012-03-31 – 2012-04-02 (×2): 1500 mg via INTRAVENOUS
  Filled 2012-03-31 (×2): qty 1500

## 2012-03-31 MED ORDER — ROCURONIUM BROMIDE 50 MG/5ML IV SOLN
50.0000 mg | Freq: Once | INTRAVENOUS | Status: AC
  Administered 2012-03-31: 50 mg via INTRAVENOUS

## 2012-03-31 MED ORDER — ETOMIDATE 2 MG/ML IV SOLN
10.0000 mg | Freq: Once | INTRAVENOUS | Status: AC
  Administered 2012-03-31: 10 mg via INTRAVENOUS

## 2012-03-31 NOTE — Progress Notes (Signed)
Pt states he is not in pain but that ETT is bothersome/frustrating; increased Fent/Versed gtt to help

## 2012-03-31 NOTE — Progress Notes (Signed)
HPI:  66 yo AAM who presented to [facility] ED 4/1 with CC: of SOB. He developed SVT/aib rvr in ED and was tx with adenosine with`subsequent hypertension. Transferred to [facility] 4/1 and developed increased resp distress and hypotension. PCCM called to bedside 4/2 for urgent intubation and treatment of hypotension.   Antibiotics:   Vancomycin 4/2>>> Zosyn 4/2>>>  Cultures/Sepsis Markers:   4/2 sput>> Neg 4/2 bc x 2> Pending 4/2 urine>> Neg  Access/Protocols:  4/2 lt i j cvl>>  4/2 OTT>>  4/2 rt rtad aline>>  Best Practice: DVT: Heparin drip. GI: Protonix.  Subjective: No events overnight, off pressors and tolerated diureses very well.  Physical Exam: Filed Vitals:   03/31/12 0857  BP:   Pulse: 74  Temp:   Resp: 21    Intake/Output Summary (Last 24 hours) at 03/31/12 0908 Last data filed at 03/31/12 0700  Gross per 24 hour  Intake 3022.4 ml  Output   1762 ml  Net 1260.4 ml   Vent Mode:  [-] PSV;CPAP FiO2 (%):  [29.7 %-30.2 %] 30 % Set Rate:  [20 bmp] 20 bmp Vt Set:  [490 mL] 490 mL PEEP:  [5 cmH20-5.1 cmH20] 5 cmH20 Pressure Support:  [10 cmH20] 10 cmH20 Plateau Pressure:  [9 cmH20-28 cmH20] 9 cmH20  Neuro: Sedated and intubated but arousable and follows commands Cardiac: RRR, Nl S1/S2, -M/R/G. Pulmonary: Diffuse crackles, decrease BS at the bases. GI: Soft, NT, ND and +BS. Extremities: -edema and -tenderness.  Labs: CBC    Component Value Date/Time   WBC 8.5 03/31/2012 0438   RBC 4.63 03/31/2012 0438   HGB 13.5 03/31/2012 0438   HCT 42.3 03/31/2012 0438   PLT 150 03/31/2012 0438   MCV 91.4 03/31/2012 0438   MCH 29.2 03/31/2012 0438   MCHC 31.9 03/31/2012 0438   RDW 15.7* 03/31/2012 0438   LYMPHSABS 1.0 03/26/2012 1715   MONOABS 0.9 03/26/2012 1715   EOSABS 0.0 03/26/2012 1715   BASOSABS 0.0 03/26/2012 1715   BMET    Component Value Date/Time   NA 150* 03/31/2012 0438   K 3.9 03/31/2012 0438   CL 113* 03/31/2012 0438   CO2 26 03/31/2012 0438   GLUCOSE 136* 03/31/2012 0438   BUN 73*  03/31/2012 0438   CREATININE 3.07* 03/31/2012 0438   CALCIUM 8.8 03/31/2012 0438   GFRNONAA 20* 03/31/2012 0438   GFRAA 23* 03/31/2012 0438   ABG    Component Value Date/Time   PHART 7.505* 03/31/2012 0427   PCO2ART 34.1* 03/31/2012 0427   PO2ART 148.0* 03/31/2012 0427   HCO3 26.9* 03/31/2012 0427   TCO2 28 03/31/2012 0427   ACIDBASEDEF 3.0* 03/28/2012 0352   O2SAT 99.0 03/31/2012 0427    Lab 03/31/12 0438  MG 2.9*   Lab Results  Component Value Date   CALCIUM 8.8 03/31/2012   PHOS 3.9 03/31/2012   Chest Xray:   Assessment & Plan: VDRF, multifactorial with CHF, Afib RVR, ? Pulmonary infection on CxR.  -PS trials today but doubt extubation due to fluid overload and deteriorating renal function. -BD with xopenex. -Pan cultures pending. -Broad spectrum abx  -Hold diureses for today due to worsening renal function. -SBT in AM again if fails.  Shock(cold) shock likely cardiogenic, now actually hypertensive. Plan: KVO IVF.  CVP remains elevated.  NGT flushes.  Hold lasix for today due to renal failure.  2 d echo EF of 30-35% with mild pulmonary HTN.  Increase lopressor to 50 BID and Norvasc 10 mg daily, will increase if  remains HTN.  Renal: worsening renal failure and hypernatremia.  Sodium rising and Cr improving. Plan: Hold diureses.  Continue NGT flushes.  F/U BMET.  No additional K.  CHF   NGT flushes and hold diureses.  Replace K  As needed.  Afib/RVR   Per cards.  Renal: History of renal insufficiency, 2.3 Cr, will replace K and diurese.  GI: TF.  Endocrine: no diabetes history but will monitor BS as TF are started, cortisol level is 60 will D/C stress dose steroids.  ID: unsure if infected but CXR with pleural effusion and pulmonary edema.  Will continue broad spectrum abx until cultures are final (urine and sputum negative, blood not yet final.  The patient is critically ill with multiple organ systems failure and requires high complexity decision making for assessment and  support, frequent evaluation and titration of therapies, application of advanced monitoring technologies and extensive interpretation of multiple databases. Critical Care Time devoted to patient care services described in this note is 35 minutes.  [name], MD [phone]

## 2012-03-31 NOTE — Progress Notes (Signed)
ANTICOAGULATION & ANTIBIOTIC CONSULT NOTE - Follow Up Consult  Pharmacy Consult for Heparin and Vancomycin + Zosyn Indication: Afib and empiric PNA/shock coverage  No Known Allergies  Patient Measurements: Height: 5\' 5"  (165.1 cm) Weight: 225 lb 5 oz (102.2 kg) IBW/kg (Calculated) : 61.5  Heparin Dosing Weight: 84 kg  Vital Signs: Temp: 98 F (36.7 C) (04/06 1619) Temp src: Oral (04/06 1619) BP: 114/80 mmHg (04/06 1800) Pulse Rate: 66  (04/06 1800)  Intake/Output from previous day: 04/04 0701 - 04/05 0700 In: 3883.1 [I.V.:1813.1; NG/GT:1370; IV Piggyback:700] Out: 3020 [Urine:3020]  Intake/Output from this shift: Total I/O In: 698.5 [I.V.:298.5; NG/GT:400] Out: 265 [Urine:265]   Labs:  [facility] 03/31/12 0438 03/30/12 0412 03/29/12 1356 03/29/12 0419  HGB 13.5 14.3 -- --  HCT 42.3 44.1 -- 42.1  PLT 150 152 -- 147*  APTT -- -- -- --  LABPROT -- -- -- --  INR -- -- -- --  HEPARINUNFRC 0.30 0.41 0.37 --  CREATININE 3.07* 3.27* -- 2.81*  CKTOTAL -- -- -- --  CKMB -- -- -- --  TROPONINI -- -- -- --   Estimated Creatinine Clearance: 27.1 ml/min (by C-G formula based on Cr of 3.07).   Basename 03/30/12 1130  VANCOTROUGH 32.4*  VANCOPEAK --  Drue Dun --  GENTTROUGH --  GENTPEAK --  GENTRANDOM --  TOBRATROUGH --  Nolen Mu --  TOBRARND --  AMIKACINPEAK --  AMIKACINTROU --  AMIKACIN --    Medications:  Anti-infectives     Start     Dose/Rate Route Frequency Ordered Stop   03/31/12 2000   vancomycin (VANCOCIN) 1,500 mg in sodium chloride 0.9 % 500 mL IVPB        1,500 mg 250 mL/hr over 120 Minutes Intravenous Every 48 hours 03/31/12 1831     03/28/12 1200   vancomycin (VANCOCIN) 1,500 mg in sodium chloride 0.9 % 500 mL IVPB  Status:  Discontinued        1,500 mg 250 mL/hr over 120 Minutes Intravenous Every 24 hours 03/27/12 1302 03/30/12 1313   03/27/12 1200   vancomycin (VANCOCIN) 2,000 mg in sodium chloride 0.9 % 500 mL IVPB        2,000 mg 250  mL/hr over 120 Minutes Intravenous  Once 03/27/12 1154 03/27/12 1417   03/27/12 1200   piperacillin-tazobactam (ZOSYN) IVPB 3.375 g        3.375 g 12.5 mL/hr over 240 Minutes Intravenous Every 8 hours 03/27/12 1154            Assessment: 66 y.o. M on heparin for Afib with a therapeutic heparin level this a.m. Hgb/Hct/Plt stable. No s/sx of bleeding noted.   The patient is also on Vancomycin and Zosyn for empiric PNA/shock coverage. Renal function worsened from baseline.  Vancomycin trough level was supra-therapeutic yesterday and vancomcyin dose held.  Repeat random level 24hrs later within goal range.    Goal of Therapy:  Heparin level 0.3-0.7 units/ml Vancomycin trough of 15-20 mcg/mL   Plan:  1. Continue heparin at current rate of 800 units/hr (8 ml/hr) 2. Resume Vancomcyin 1500mg  Q48hrs today. 3. Continue Zosyn 3.375g IV every 8 hours  4. Daily heparin level, CBC with routine platelet monitoring per protocol 6. Will continue to follow renal function, culture results, LOT, and antibiotic de-escalation plans   Junita Push, PharmD, BCPS 03/31/2012 6:32 PM

## 2012-03-31 NOTE — Progress Notes (Signed)
eLink Physician-Brief Progress Note Patient Name: Martin [name] DOB: 1946/02/26 MRN: [id number]  Date of Service  03/31/2012   HPI/Events of Note   Bloody secretions from ET tube and pink-tinged urine on heparin drip  eICU Interventions  Holding heparin drip given traumatic self-extubation earlier and possible bleeding.   Intervention Category Intermediate Interventions: Other:  Inell Mimbs J 03/31/2012, 10:18 PM

## 2012-03-31 NOTE — Progress Notes (Signed)
Dr. Tula Nakayama called re: bloody ETT secretions and pink tinged urine; order to stop Hep gtt for tonight received, gtt stopped

## 2012-03-31 NOTE — Progress Notes (Signed)
   Subjective: Awake on ventilator. Denies chest pain by shaking head.   Objective: Temp:  [97 F (36.1 C)-98.9 F (37.2 C)] 97.7 F (36.5 C) (04/06 1204) Pulse Rate:  [59-85] 69  (04/06 1205) Resp:  [18-29] 19  (04/06 1205) BP: (98-172)/(66-112) 149/90 mmHg (04/06 1205) SpO2:  [97 %-100 %] 99 % (04/06 1205) Arterial Line BP: (103-170)/(63-98) 159/92 mmHg (04/06 0930) FiO2 (%):  [29.7 %-30.2 %] 30 % (04/06 1205) Weight:  [225 lb 5 oz (102.2 kg)] 225 lb 5 oz (102.2 kg) (04/06 0500)  I/O last 3 completed shifts: In: 4586.7 [I.V.:2236.7; NG/GT:2100; IV Piggyback:250] Out: 3987 [Urine:3985; Stool:2]  Telemetry - Sinus rhythm.  Exam -   General - NAD.  Lungs - Corase BS, decreased at bilateral bases.  Cardiac - RRR, distant.  Extremities - 2+ edema.   Testing -   Lab Results  Component Value Date   WBC 8.5 03/31/2012   HGB 13.5 03/31/2012   HCT 42.3 03/31/2012   MCV 91.4 03/31/2012   PLT 150 03/31/2012    Lab Results  Component Value Date   CREATININE 3.07* 03/31/2012   BUN 73* 03/31/2012   NA 150* 03/31/2012   K 3.9 03/31/2012   CL 113* 03/31/2012   CO2 26 03/31/2012    Lab Results  Component Value Date   CKTOTAL 96 03/27/2012   CKMB 7.3* 03/27/2012   TROPONINI <0.30 03/27/2012    Current Medications    . amLODipine  10 mg Per Tube Daily  . antiseptic oral rinse  15 mL Mouth Rinse QID  . aspirin EC  81 mg Oral Daily  . chlorhexidine  15 mL Mouth Rinse BID  . docusate  100 mg Oral BID  . feeding supplement  30 mL Per Tube QID  . feeding supplement (PROMOTE)  1,000 mL Per Tube Q24H  . folic acid  1 mg Oral Daily  . free water  300 mL Per Tube Q6H  . insulin aspart  0-9 Units Subcutaneous Q4H  . ipratropium  0.5 mg Nebulization Q6H  . levalbuterol  0.63 mg Nebulization Q6H  . metoprolol tartrate  50 mg Per Tube BID  . mulitivitamin with minerals  1 tablet Oral Daily  . pantoprazole sodium  40 mg Per Tube Q1200  . piperacillin-tazobactam (ZOSYN)  IV  3.375 g Intravenous  Q8H  . sodium chloride  3 mL Intravenous Q12H  . thiamine  100 mg Oral Daily   Or  . thiamine  100 mg Intravenous Daily  . DISCONTD: amLODipine  5 mg Per Tube Daily  . DISCONTD: free water  200 mL Per Tube Q6H  . DISCONTD: vancomycin  1,500 mg Intravenous Q24H    Assessment:  1. SVT/AF, current stable in sinus rhythm on Amiodarone and Heparin.  2. Cardiomyopathy, LVEF 30-35%. Further workup deferred at this point until clinically improved.  3. VDRF, managed by CCM.   4. ARF, creatinine 3.0. Diuretics held.  5. Shock, improving blood pressure, off pressors.  Plan:  Most of this patient's management is per CCM at this point. Rhythm is stable on Amiodarone and Heparin. Also receiving ASA, Norvasc, Lopressor. No ACE-I or ARB with ARF. If blood pressure trend stays up, might consider Hydralazine and nitrate.  Jonelle Sidle, M.D., F.A.C.C.

## 2012-03-31 NOTE — Procedures (Signed)
Intubation Procedure Note Martin [name] [id number] 02-25-1946  Procedure: Intubation Indications: Airway protection and maintenance  Procedure Details Consent: Unable to obtain consent because of emergent medical necessity. Time Out: Verified patient identification, verified procedure, site/side was marked, verified correct patient position, special equipment/implants available, medications/allergies/relevent history reviewed, required imaging and test results available.  Performed  Maximum sterile technique was used including gloves, hand hygiene and mask.  MAC    Evaluation Hemodynamic Status: BP stable throughout; O2 sats: stable throughout Patient's Current Condition: stable Complications: No apparent complications Patient did tolerate procedure well. Chest X-ray ordered to verify placement.  CXR: pending.   Martin [name] 03/31/2012

## 2012-04-01 ENCOUNTER — Inpatient Hospital Stay (HOSPITAL_COMMUNITY): Payer: Medicaid Other

## 2012-04-01 DIAGNOSIS — J9600 Acute respiratory failure, unspecified whether with hypoxia or hypercapnia: Secondary | ICD-10-CM

## 2012-04-01 DIAGNOSIS — N19 Unspecified kidney failure: Secondary | ICD-10-CM

## 2012-04-01 LAB — CBC
MCH: 29.3 pg (ref 26.0–34.0)
MCV: 90.5 fL (ref 78.0–100.0)
Platelets: 146 10*3/uL — ABNORMAL LOW (ref 150–400)
RDW: 15.6 % — ABNORMAL HIGH (ref 11.5–15.5)
WBC: 8.1 10*3/uL (ref 4.0–10.5)

## 2012-04-01 LAB — BASIC METABOLIC PANEL
Calcium: 8.8 mg/dL (ref 8.4–10.5)
Creatinine, Ser: 2.59 mg/dL — ABNORMAL HIGH (ref 0.50–1.35)
GFR calc Af Amer: 29 mL/min — ABNORMAL LOW (ref >90)

## 2012-04-01 LAB — GLUCOSE, CAPILLARY
Glucose-Capillary: 130 mg/dL — ABNORMAL HIGH (ref 70–99)
Glucose-Capillary: 141 mg/dL — ABNORMAL HIGH (ref 70–99)
Glucose-Capillary: 118 mg/dL — ABNORMAL HIGH (ref 70–99)

## 2012-04-01 LAB — MAGNESIUM: Magnesium: 2.8 mg/dL — ABNORMAL HIGH (ref 1.5–2.5)

## 2012-04-01 MED ORDER — FUROSEMIDE 10 MG/ML IJ SOLN
5.0000 mg/h | INTRAVENOUS | Status: DC
  Administered 2012-04-01: 5 mg/h via INTRAVENOUS
  Filled 2012-04-01 (×2): qty 25

## 2012-04-01 MED ORDER — ALBUMIN HUMAN 25 % IV SOLN
25.0000 g | Freq: Four times a day (QID) | INTRAVENOUS | Status: AC
  Administered 2012-04-01 – 2012-04-02 (×4): 25 g via INTRAVENOUS
  Filled 2012-04-01 (×4): qty 100

## 2012-04-01 MED ORDER — METOLAZONE 5 MG PO TABS
5.0000 mg | ORAL_TABLET | Freq: Every day | ORAL | Status: AC
  Administered 2012-04-01: 5 mg via ORAL
  Filled 2012-04-01: qty 1

## 2012-04-01 MED ORDER — POTASSIUM CHLORIDE 20 MEQ/15ML (10%) PO LIQD
40.0000 meq | Freq: Three times a day (TID) | ORAL | Status: AC
  Administered 2012-04-01 (×2): 40 meq
  Filled 2012-04-01 (×2): qty 30

## 2012-04-01 NOTE — Progress Notes (Signed)
HPI:  66 yo AAM who presented to [facility] ED 4/1 with CC: of SOB. He developed SVT/aib rvr in ED and was tx with adenosine with`subsequent hypertension. Transferred to [facility] 4/1 and developed increased resp distress and hypotension. PCCM called to bedside 4/2 for urgent intubation and treatment of hypotension.   Antibiotics:   Vancomycin 4/2>>> Zosyn 4/2>>>  Cultures/Sepsis Markers:   4/2 sput>> Neg 4/2 bc x 2> Pending 4/2 urine>> Neg  Access/Protocols:  4/2 lt i j cvl>>  4/2 OTT>>  4/2 rt rtad aline>>  Best Practice: DVT: Heparin drip. GI: Protonix.  Subjective: No events overnight, off pressors and tolerated diureses very well.  Physical Exam: Filed Vitals:   04/01/12 0744  BP:   Pulse:   Temp: 97.7 F (36.5 C)  Resp:     Intake/Output Summary (Last 24 hours) at 04/01/12 0907 Last data filed at 04/01/12 0700  Gross per 24 hour  Intake 3147.15 ml  Output   1800 ml  Net 1347.15 ml   Vent Mode:  [-] PRVC FiO2 (%):  [29.8 %-31.5 %] 30 % Set Rate:  [20 bmp] 20 bmp Vt Set:  [490 mL] 490 mL PEEP:  [0 cmH20-5.5 cmH20] 5.1 cmH20 Pressure Support:  [10 cmH20] 10 cmH20 Plateau Pressure:  [27 cmH20-28 cmH20] 27 cmH20  Neuro: Sedated and intubated but arousable and follows commands Cardiac: RRR, Nl S1/S2, -M/R/G. Pulmonary: Diffuse crackles, decrease BS at the bases. GI: Soft, NT, ND and +BS. Extremities: -edema and -tenderness.  Labs: CBC    Component Value Date/Time   WBC 8.1 04/01/2012 0430   RBC 4.75 04/01/2012 0430   HGB 13.9 04/01/2012 0430   HCT 43.0 04/01/2012 0430   PLT 146* 04/01/2012 0430   MCV 90.5 04/01/2012 0430   MCH 29.3 04/01/2012 0430   MCHC 32.3 04/01/2012 0430   RDW 15.6* 04/01/2012 0430   LYMPHSABS 1.0 03/26/2012 1715   MONOABS 0.9 03/26/2012 1715   EOSABS 0.0 03/26/2012 1715   BASOSABS 0.0 03/26/2012 1715   BMET    Component Value Date/Time   NA 149* 04/01/2012 0430   K 3.5 04/01/2012 0430   CL 112 04/01/2012 0430   CO2 26 04/01/2012 0430   GLUCOSE 134* 04/01/2012 0430     BUN 68* 04/01/2012 0430   CREATININE 2.59* 04/01/2012 0430   CALCIUM 8.8 04/01/2012 0430   GFRNONAA 25* 04/01/2012 0430   GFRAA 29* 04/01/2012 0430   ABG    Component Value Date/Time   PHART 7.505* 03/31/2012 0427   PCO2ART 34.1* 03/31/2012 0427   PO2ART 148.0* 03/31/2012 0427   HCO3 26.9* 03/31/2012 0427   TCO2 28 03/31/2012 0427   ACIDBASEDEF 3.0* 03/28/2012 0352   O2SAT 99.0 03/31/2012 0427    Lab 04/01/12 0430  MG 2.8*   Lab Results  Component Value Date   CALCIUM 8.8 04/01/2012   PHOS 3.8 04/01/2012   Chest Xray:   Assessment & Plan: VDRF, multifactorial with CHF, Afib RVR, ? Pulmonary infection on CxR.  Self extubated on 4/6 and required re-intubation.  Renal function is precluding diuresing which is the reason for his respiratory failure.    -PS trials today again but doubt extubation due to fluid overload and deteriorating renal function. -BD with xopenex. -Pan cultures pending for incubation but negative to date. -Continue broad spectrum abx  -Lasix drip at 5 mg/hr today with zaroxolyn and KVO IVF. -SBT in AM. -NGT flushes.  Shock(cold) shock likely cardiogenic, now actually hypertensive. Plan: -KVO IVF.  -CVP remains elevated.  -  NGT flushes.  -Lasix drip.  -2 d echo EF of 30-35% with mild pulmonary HTN.  -Maintain lopressor to 50 BID and Norvasc 10 mg daily.  Renal: Improving renal failure but not to baseline and hypernatremia.  Sodium rising and Cr improving.  Recognize that renal function is an issue but can not continue to have him fluid positive as we will never be able to get him off the vent, hope is that as we use lasix drip with a steady bloodstream concentration that we will avoid rise and dips on lasix levels and hopefully have lesser effect on renal function.  Plan: Lasix drip as discussed above.  Albumin.  Continue NGT flushes.  F/U BMET.  K replacement PO.  CHF   NGT flushes and hold diureses.  Replace K  As needed.  Afib/RVR   Per cards.  Renal: History of  renal insufficiency, 2.3 Cr, will replace K and diurese as above.  GI: TF.  Endocrine: no diabetes history but will monitor BS as TF are started, cortisol level is 60 D/C stress dose steroids.  ID: unsure if infected but CXR with pleural effusion and pulmonary edema.  Will continue broad spectrum abx until cultures are final (urine and sputum negative, blood not yet final.  The patient is critically ill with multiple organ systems failure and requires high complexity decision making for assessment and support, frequent evaluation and titration of therapies, application of advanced monitoring technologies and extensive interpretation of multiple databases. Critical Care Time devoted to patient care services described in this note is 35 minutes.  [name], MD [phone]

## 2012-04-01 NOTE — Progress Notes (Signed)
Subjective: Awake on ventilator, does not indicate chest pain.   Objective: Temp:  [96.5 F (35.8 C)-98 F (36.7 C)] 97.7 F (36.5 C) (04/07 0744) Pulse Rate:  [56-69] 57  (04/07 1005) Resp:  [10-23] 20  (04/07 1005) BP: (89-149)/(63-100) 124/71 mmHg (04/07 0909) SpO2:  [98 %-100 %] 100 % (04/07 1005) Arterial Line BP: (107-166)/(66-97) 151/77 mmHg (04/07 1001) FiO2 (%):  [29.8 %-31.5 %] 30.1 % (04/07 1005) Weight:  [224 lb 13.9 oz (102 kg)] 224 lb 13.9 oz (102 kg) (04/07 0500)  I/O last 3 completed shifts: In: 4885 [I.V.:1795; NG/GT:2340; IV Piggyback:750] Out: 3182 [Urine:3180; Stool:2]  Telemetry - Sinus rhythm, PACs and PVCs.  Exam -   General - NAD.   Lungs - Corase BS, decreased at bilateral bases.   Cardiac - RRR, distant.   Extremities - 2+ edema.  Testing -   Lab Results  Component Value Date   WBC 8.1 04/01/2012   HGB 13.9 04/01/2012   HCT 43.0 04/01/2012   MCV 90.5 04/01/2012   PLT 146* 04/01/2012    Lab Results  Component Value Date   CREATININE 2.59* 04/01/2012   BUN 68* 04/01/2012   NA 149* 04/01/2012   K 3.5 04/01/2012   CL 112 04/01/2012   CO2 26 04/01/2012    Lab Results  Component Value Date   CKTOTAL 96 03/27/2012   CKMB 7.3* 03/27/2012   TROPONINI <0.30 03/27/2012    CXR 4/6 IMPRESSION:  1. Stable position of endotracheal tube.  2. Persistent bilateral pleural effusions and decreased aeration  of both lungs.   Current Medications    . albumin human  25 g Intravenous Q6H  . amLODipine  10 mg Per Tube Daily  . antiseptic oral rinse  15 mL Mouth Rinse QID  . aspirin EC  81 mg Oral Daily  . chlorhexidine  15 mL Mouth Rinse BID  . docusate  100 mg Oral BID  . etomidate  10 mg Intravenous Once  . feeding supplement  30 mL Per Tube QID  . feeding supplement (PROMOTE)  1,000 mL Per Tube Q24H  . folic acid  1 mg Oral Daily  . free water  300 mL Per Tube Q6H  . insulin aspart  0-9 Units Subcutaneous Q4H  . ipratropium  0.5 mg Nebulization Q6H  .  levalbuterol  0.63 mg Nebulization Q6H  . metolazone  5 mg Oral Daily  . metoprolol tartrate  50 mg Per Tube BID  . mulitivitamin with minerals  1 tablet Oral Daily  . pantoprazole sodium  40 mg Per Tube Q1200  . piperacillin-tazobactam (ZOSYN)  IV  3.375 g Intravenous Q8H  . potassium chloride  40 mEq Per Tube TID  . rocuronium  50 mg Intravenous Once  . sodium chloride  3 mL Intravenous Q12H  . thiamine  100 mg Oral Daily   Or  . thiamine  100 mg Intravenous Daily  . vancomycin  1,500 mg Intravenous Q48H    Assessment:  1. SVT/AF, maintaining sinus rhythm on Amiodarone, Metoprolol and Heparin.   2. Cardiomyopathy, LVEF 30-35%. Further workup deferred at this point until clinically improved. Still volume overload, Lasix GTT initiated by CCM.   3. VDRF, appreciate ongoing management by CCM.   4. ARF, creatinine down to 2.5 today.   5. Hypertension. Blood pressure trend up.  Plan:  Recent medication adjustments noted. Focus on improving volume status as rhythm is stable. Might ultimately consider change to Coreg from Lopressor. Possibly Hydralazine and  nitrate if blood pressure tolerates.   Jonelle Sidle, M.D., F.A.C.C.

## 2012-04-02 ENCOUNTER — Inpatient Hospital Stay (HOSPITAL_COMMUNITY): Payer: Medicaid Other

## 2012-04-02 DIAGNOSIS — J810 Acute pulmonary edema: Secondary | ICD-10-CM

## 2012-04-02 DIAGNOSIS — F1010 Alcohol abuse, uncomplicated: Secondary | ICD-10-CM

## 2012-04-02 DIAGNOSIS — I5021 Acute systolic (congestive) heart failure: Secondary | ICD-10-CM

## 2012-04-02 DIAGNOSIS — J9600 Acute respiratory failure, unspecified whether with hypoxia or hypercapnia: Secondary | ICD-10-CM

## 2012-04-02 LAB — CBC
HCT: 39.7 % (ref 39.0–52.0)
Platelets: 132 10*3/uL — ABNORMAL LOW (ref 150–400)
RDW: 15.5 % (ref 11.5–15.5)
WBC: 7.7 10*3/uL (ref 4.0–10.5)

## 2012-04-02 LAB — CULTURE, BLOOD (ROUTINE X 2)
Culture  Setup Time: 201304022047
Culture: NO GROWTH

## 2012-04-02 LAB — MAGNESIUM: Magnesium: 2.5 mg/dL (ref 1.5–2.5)

## 2012-04-02 LAB — GLUCOSE, CAPILLARY
Glucose-Capillary: 103 mg/dL — ABNORMAL HIGH (ref 70–99)
Glucose-Capillary: 113 mg/dL — ABNORMAL HIGH (ref 70–99)
Glucose-Capillary: 123 mg/dL — ABNORMAL HIGH (ref 70–99)
Glucose-Capillary: 132 mg/dL — ABNORMAL HIGH (ref 70–99)
Glucose-Capillary: 133 mg/dL — ABNORMAL HIGH (ref 70–99)

## 2012-04-02 LAB — BASIC METABOLIC PANEL
GFR calc Af Amer: 30 mL/min — ABNORMAL LOW (ref >90)
GFR calc non Af Amer: 26 mL/min — ABNORMAL LOW (ref >90)
Glucose, Bld: 125 mg/dL — ABNORMAL HIGH (ref 70–99)
Potassium: 3.2 mEq/L — ABNORMAL LOW (ref 3.5–5.1)
Sodium: 151 mEq/L — ABNORMAL HIGH (ref 135–145)

## 2012-04-02 LAB — HEPARIN LEVEL (UNFRACTIONATED)
Heparin Unfractionated: <0.1 IU/mL — ABNORMAL LOW (ref 0.30–0.70)
Heparin Unfractionated: 0.46 IU/mL (ref 0.30–0.70)

## 2012-04-02 LAB — PHOSPHORUS: Phosphorus: 3.4 mg/dL (ref 2.3–4.6)

## 2012-04-02 MED ORDER — LORAZEPAM 2 MG/ML IJ SOLN: 2.0000 mg | INTRAMUSCULAR | Status: DC | PRN

## 2012-04-02 MED ORDER — ASPIRIN 81 MG PO CHEW
81.0000 mg | CHEWABLE_TABLET | Freq: Every day | ORAL | Status: DC
  Administered 2012-04-02: 81 mg
  Filled 2012-04-02: qty 1

## 2012-04-02 MED ORDER — PANTOPRAZOLE SODIUM 40 MG PO TBEC
40.0000 mg | DELAYED_RELEASE_TABLET | Freq: Every day | ORAL | Status: DC
  Administered 2012-04-02 – 2012-04-17 (×16): 40 mg via ORAL
  Filled 2012-04-02 (×15): qty 1

## 2012-04-02 MED ORDER — ADULT MULTIVITAMIN LIQUID CH
5.0000 mL | Freq: Every day | ORAL | Status: DC
  Administered 2012-04-02: 5 mL
  Filled 2012-04-02: qty 5

## 2012-04-02 MED ORDER — VITAMIN B-1 100 MG PO TABS
100.0000 mg | ORAL_TABLET | Freq: Every day | ORAL | Status: DC
  Administered 2012-04-02: 100 mg
  Filled 2012-04-02: qty 1

## 2012-04-02 MED ORDER — POTASSIUM CHLORIDE 20 MEQ/15ML (10%) PO LIQD
40.0000 meq | ORAL | Status: AC
  Administered 2012-04-02 (×3): 40 meq
  Filled 2012-04-02 (×3): qty 30

## 2012-04-02 MED ORDER — ADULT MULTIVITAMIN W/MINERALS CH
1.0000 | ORAL_TABLET | Freq: Every day | ORAL | Status: DC
  Administered 2012-04-03 – 2012-04-17 (×15): 1 via ORAL
  Filled 2012-04-02 (×15): qty 1

## 2012-04-02 MED ORDER — VITAMIN B-1 100 MG PO TABS
100.0000 mg | ORAL_TABLET | Freq: Every day | ORAL | Status: DC
  Administered 2012-04-03 – 2012-04-17 (×15): 100 mg via ORAL
  Filled 2012-04-02 (×15): qty 1

## 2012-04-02 MED ORDER — PANTOPRAZOLE SODIUM 40 MG PO TBEC: 40.0000 mg | DELAYED_RELEASE_TABLET | Freq: Every day | ORAL | Status: DC

## 2012-04-02 MED ORDER — ASPIRIN 81 MG PO CHEW
81.0000 mg | CHEWABLE_TABLET | Freq: Every day | ORAL | Status: DC
  Administered 2012-04-03 – 2012-04-05 (×3): 81 mg via ORAL
  Filled 2012-04-02 (×3): qty 1

## 2012-04-02 MED ORDER — ACETAMINOPHEN 650 MG/20.3ML PO SUSP
500.0000 mg | ORAL | Status: DC | PRN
  Filled 2012-04-02: qty 15.6

## 2012-04-02 MED ORDER — POTASSIUM CHLORIDE 20 MEQ/15ML (10%) PO LIQD
40.0000 meq | Freq: Once | ORAL | Status: AC
  Administered 2012-04-02: 40 meq
  Filled 2012-04-02: qty 30

## 2012-04-02 MED ORDER — HEPARIN (PORCINE) IN NACL 100-0.45 UNIT/ML-% IJ SOLN
1000.0000 [IU]/h | INTRAMUSCULAR | Status: DC
  Administered 2012-04-02: 800 [IU]/h via INTRAVENOUS
  Filled 2012-04-02 (×2): qty 250

## 2012-04-02 MED ORDER — FUROSEMIDE 10 MG/ML IJ SOLN
60.0000 mg | Freq: Two times a day (BID) | INTRAMUSCULAR | Status: DC
  Administered 2012-04-02 – 2012-04-08 (×12): 60 mg via INTRAVENOUS
  Filled 2012-04-02 (×17): qty 6

## 2012-04-02 MED ORDER — AMIODARONE LOAD VIA INFUSION
150.0000 mg | Freq: Once | INTRAVENOUS | Status: AC
  Administered 2012-04-02: 150 mg via INTRAVENOUS
  Filled 2012-04-02: qty 83.34

## 2012-04-02 MED ORDER — FOLIC ACID 1 MG PO TABS
1.0000 mg | ORAL_TABLET | Freq: Every day | ORAL | Status: DC
  Administered 2012-04-03 – 2012-04-17 (×15): 1 mg via ORAL
  Filled 2012-04-02 (×15): qty 1

## 2012-04-02 MED ORDER — FOLIC ACID 1 MG PO TABS
1.0000 mg | ORAL_TABLET | Freq: Every day | ORAL | Status: DC
  Administered 2012-04-02: 1 mg
  Filled 2012-04-02: qty 1

## 2012-04-02 MED ORDER — METOPROLOL TARTRATE 50 MG PO TABS
50.0000 mg | ORAL_TABLET | Freq: Two times a day (BID) | ORAL | Status: DC
  Administered 2012-04-02 – 2012-04-05 (×6): 50 mg via ORAL
  Filled 2012-04-02 (×8): qty 1

## 2012-04-02 NOTE — Progress Notes (Signed)
ANTICOAGULATION CONSULT NOTE - Follow Up Consult  Pharmacy Consult for IV heparin Indication: atrial fibrillation  No Known Allergies  Patient Measurements: Height: 5\' 5"  (165.1 cm) Weight: 202 lb 13.2 oz (92 kg) IBW/kg (Calculated) : 61.5  Heparin Dosing Weight: 84 kg  Vital Signs: Temp: 98.7 F (37.1 C) (04/08 2018) Temp src: Oral (04/08 2018) BP: 144/94 mmHg (04/08 2300) Pulse Rate: 66  (04/08 2300)  Labs:  Basename 04/02/12 2237 04/02/12 1503 04/02/12 0400 04/01/12 0430 03/31/12 0438  HGB -- -- 12.6* 13.9 --  HCT -- -- 39.7 43.0 42.3  PLT -- -- 132* 146* 150  APTT -- -- -- -- --  LABPROT -- -- -- -- --  INR -- -- -- -- --  HEPARINUNFRC 0.46 <0.10* <0.10* -- --  CREATININE -- -- 2.50* 2.59* 3.07*  CKTOTAL -- -- -- -- --  CKMB -- -- -- -- --  TROPONINI -- -- -- -- --   Estimated Creatinine Clearance: 31.5 ml/min (by C-G formula based on Cr of 2.5).  Assessment: 66 yo male with Afib for heparin.   Goal of Therapy:  Heparin level 0.3-0.7 units/ml   Plan:  Continue Heparin at current rate Follow-up am labs.  Geannie Risen, PharmD, BCPS   04/02/2012,11:39 PM

## 2012-04-02 NOTE — Procedures (Signed)
Extubation Procedure Note Pt extubated to 4L [place] and tolerating well. RT will continue to monitor.  Patient Details:   Name: Calven Gilkes DOB: 11/25/1946 MRN: [id number]   Airway Documentation:  Airway 8 mm (Active)  Secured at (cm) 26 cm 03/31/2012 12:05 PM  Measured From Lips 03/31/2012 12:05 PM  Secured Location Right 03/31/2012 12:05 PM  Secured By [employer] 03/31/2012 12:05 PM  Tube Holder Repositioned Yes 03/31/2012 12:05 PM  Cuff Pressure (cm H2O) 26 cm H2O 03/31/2012  3:16 AM  Site Condition Dry;Cool 03/31/2012  8:15 AM     Airway 7.5 mm (Active)  Secured at (cm) 24 cm 04/02/2012  7:38 AM  Measured From Lips 04/02/2012  7:38 AM  Secured Location Right 04/02/2012  7:44 AM  Secured By [employer] 04/02/2012  7:38 AM  Tube Holder Repositioned Yes 04/02/2012  7:38 AM  Cuff Pressure (cm H2O) 25 cm H2O 04/02/2012  7:38 AM  Site Condition Dry 04/02/2012  4:03 AM    Evaluation  O2 sats: stable throughout Complications: No apparent complications Patient did tolerate procedure well. Bilateral Breath Sounds: Rhonchi;Diminished Suctioning: Airway Yes  Dorethea Clan 04/02/2012, 8:39 AM

## 2012-04-02 NOTE — Progress Notes (Signed)
eLink Physician-Brief Progress Note Patient Name: Martin [name] DOB: June 19, 1946 MRN: [id number]  Date of Service  04/02/2012   HPI/Events of Note  Hypokalemia   eICU Interventions  Potassium replaced   Intervention Category Minor Interventions: Electrolytes abnormality - evaluation and management  [name] 04/02/2012, 6:03 AM

## 2012-04-02 NOTE — Evaluation (Signed)
Clinical/Bedside Swallow Evaluation Patient Details  Name: Martin [name] MRN: [id number] DOB: 04-15-1946 Today's Date: 04/02/2012  Past Medical History:  Past Medical History  Diagnosis Date  . Hypertension    Past Surgical History: History reviewed. No pertinent past surgical history. HPI:  Pt is a 66 year old male admitted with cough, SOB with CHF. Intubated from 4/2-4/6 and then reintubated 4/6-4/8. CXR shows pleural effusions. Pt denies any previous difficutly swallowing.    Assessment/Recommendations/Treatment Plan    SLP Assessment Clinical Impression Statement: Pt presents witha  normal oral and oropharyngeal swallow with no overt s/s of aspiration, appropriate timing of swallow response and strength. Pt is safe to progress to a regular diet and thin liquids when medically ready. RN and MD aware.  Risk for Aspiration: None  Swallow Evaluation Recommendations Diet Recommendations: Regular;Thin liquid Medication Administration: Whole meds with liquid Supervision: Patient able to self feed Oral Care Recommendations: Oral care QID Follow up Recommendations: None  Treatment Plan Treatment Plan Recommendations: No treatment recommended at this time     Individuals Consulted Consulted and Agree with Results and Recommendations: Patient;MD;RN   Martin Ditty, MA CCC-SLP [phone]  Claudine Mouton 04/02/2012,12:07 PM

## 2012-04-02 NOTE — Progress Notes (Signed)
ANTICOAGULATION CONSULT NOTE - Follow Up Consult  Pharmacy Consult for IV heparin Indication: atrial fibrillation  No Known Allergies  Patient Measurements: Height: 5\' 5"  (165.1 cm) Weight: 202 lb 13.2 oz (92 kg) IBW/kg (Calculated) : 61.5  Heparin Dosing Weight: 84 kg  Vital Signs: Temp: 98.3 F (36.8 C) (04/08 1548) Temp src: Oral (04/08 1548) BP: 112/83 mmHg (04/08 1500) Pulse Rate: 66  (04/08 1500)  Labs:  Basename 04/02/12 1503 04/02/12 0400 04/01/12 0430 03/31/12 0438  HGB -- 12.6* 13.9 --  HCT -- 39.7 43.0 42.3  PLT -- 132* 146* 150  APTT -- -- -- --  LABPROT -- -- -- --  INR -- -- -- --  HEPARINUNFRC <0.10* <0.10* -- 0.30  CREATININE -- 2.50* 2.59* 3.07*  CKTOTAL -- -- -- --  CKMB -- -- -- --  TROPONINI -- -- -- --   Estimated Creatinine Clearance: 31.5 ml/min (by C-G formula based on Cr of 2.5).   Medications:  Infusions:    . amiodarone (NEXTERONE PREMIX) 360 mg/200 mL dextrose 30 mg/hr (04/02/12 1333)  . amiodarone (NEXTERONE PREMIX) 360 mg/200 mL dextrose 30 mg/hr (04/02/12 0900)  . heparin 800 Units/hr (04/02/12 1007)  . DISCONTD: diltiazem (CARDIZEM) infusion Stopped (03/27/12 0955)  . DISCONTD: DOPamine Stopped (03/27/12 1732)  . DISCONTD: fentaNYL infusion INTRAVENOUS 75 mcg/hr (03/31/12 2200)  . DISCONTD: furosemide (LASIX) infusion 5 mg/hr (04/01/12 1048)  . DISCONTD: midazolam (VERSED) infusion 1 mg/hr (04/01/12 2301)  . DISCONTD: norepinephrine (LEVOPHED) Adult infusion Stopped (03/28/12 0200)    Assessment: 66yo male resumed on IV heparin this am at 800 units /hr (had previously been therapeutic at this dose but was held over weekend due to bloody secretions).  HL 5 hours (heparin delayed slightly in starting) was <0.10 on 800 units/hr (8 ml/hr). No bleeding issues noted per RN.estCrCl~ 31.5 ml/min. May still accumulate some due to renal function and early level. Patient currently in and out of Afib.   Goal of Therapy:  Heparin level  0.3-0.7 units/ml   Plan:  1. Increase heparin to 1000 units/hr (10 ml/hr). 2. Recheck heparin level in 6 hours.   Link Snuffer, PharmD, BCPS Clinical Pharmacist [phone] 04/02/2012,3:58 PM

## 2012-04-02 NOTE — Progress Notes (Addendum)
   Subjective: Awake on ventilator, no new events  Physical Exam: Filed Vitals:   04/02/12 0715 04/02/12 0730 04/02/12 0738 04/02/12 0751  BP:   122/70   Pulse: 110 33 110   Temp:    97.1 F (36.2 C)  TempSrc:    Axillary  Resp: 20 20 18    Height:      Weight:      SpO2: 99% 99% 100%     GEN- The patient is ill appearing, sedated on vent Head- normocephalic, atraumatic Eyes-  Sclera clear, conjunctiva pink Ears- hearing intact Oropharynx- ETT tube Lungs- decreased lung sounds Heart- irregular rate and rhythm  GI- soft, NT, ND, + BS Extremities- no clubbing, cyanosis, 1+edema MS- no significant deformity or atrophy  Testing -   Lab Results  Component Value Date   WBC 7.7 04/02/2012   HGB 12.6* 04/02/2012   HCT 39.7 04/02/2012   MCV 91.5 04/02/2012   PLT 132* 04/02/2012    Lab Results  Component Value Date   CREATININE 2.50* 04/02/2012   BUN 66* 04/02/2012   NA 151* 04/02/2012   K 3.2* 04/02/2012   CL 108 04/02/2012   CO2 30 04/02/2012    Lab Results  Component Value Date   CKTOTAL 96 03/27/2012   CKMB 7.3* 03/27/2012   TROPONINI <0.30 03/27/2012    CXR 4/6 IMPRESSION:  1. Stable position of endotracheal tube.  2. Persistent bilateral pleural effusions and decreased aeration  of both lungs.   Current Medications    . albumin human  25 g Intravenous Q6H  . amLODipine  10 mg Per Tube Daily  . antiseptic oral rinse  15 mL Mouth Rinse QID  . aspirin EC  81 mg Oral Daily  . chlorhexidine  15 mL Mouth Rinse BID  . docusate  100 mg Oral BID  . feeding supplement  30 mL Per Tube QID  . feeding supplement (PROMOTE)  1,000 mL Per Tube Q24H  . folic acid  1 mg Oral Daily  . free water  300 mL Per Tube Q6H  . insulin aspart  0-9 Units Subcutaneous Q4H  . ipratropium  0.5 mg Nebulization Q6H  . levalbuterol  0.63 mg Nebulization Q6H  . metolazone  5 mg Oral Daily  . metoprolol tartrate  50 mg Per Tube BID  . mulitivitamin with minerals  1 tablet Oral Daily  . pantoprazole  sodium  40 mg Per Tube Q1200  . piperacillin-tazobactam (ZOSYN)  IV  3.375 g Intravenous Q8H  . potassium chloride  40 mEq Per Tube TID  . potassium chloride  40 mEq Per Tube Once  . sodium chloride  3 mL Intravenous Q12H  . thiamine  100 mg Oral Daily   Or  . thiamine  100 mg Intravenous Daily  . vancomycin  1,500 mg Intravenous Q48H    Assessment:  1. SVT/AF, presently rate controlled on Amiodarone, Metoprolol and Heparin.  I suspect that afib is presently exacerbated by underlying illness No changes today  2. Cardiomyopathy, LVEF 30-35%. Further workup deferred at this point until clinically improved. Still volume overload, Lasix GTT initiated by CCM.   3. VDRF, appreciate ongoing management by CCM.   4. ARF, stable creatinine with lasix gtt, though renal function is guarded  5. Hypertension. Stable  6. Hypokalemia- CCM to replete  Martin Fearing Tyrus Wilms,MD 8:21 AM 04/02/2012

## 2012-04-02 NOTE — Progress Notes (Signed)
Waste Note:  Versed 35ml wasted in sink, Fentanyl 65ml wasted in sink. Witnessed with Radio producer

## 2012-04-02 NOTE — Progress Notes (Addendum)
ANTICOAGULATION - Follow Up Consult  Pharmacy Consult for Heparin  Indication: Afib  No Known Allergies  Patient Measurements: Height: 5\' 5"  (165.1 cm) Weight: 202 lb 13.2 oz (92 kg) IBW/kg (Calculated) : 61.5  Heparin Dosing Weight: 84 kg  Vital Signs: Temp: 97.1 F (36.2 C) (04/08 0751) Temp src: Axillary (04/08 0751) BP: 119/91 mmHg (04/08 0751) Pulse Rate: 107  (04/08 0751)  Intake/Output from previous day: 04/04 0701 - 04/05 0700 In: 3883.1 [I.V.:1813.1; NG/GT:1370; IV Piggyback:700] Out: 3020 [Urine:3020]  Intake/Output from this shift: Total I/O In: 698.5 [I.V.:298.5; NG/GT:400] Out: 265 [Urine:265]   Labs:  [facility] 04/02/12 0400 04/01/12 0430 03/31/12 0438  HGB 12.6* 13.9 --  HCT 39.7 43.0 42.3  PLT 132* 146* 150  APTT -- -- --  LABPROT -- -- --  INR -- -- --  HEPARINUNFRC <0.10* -- 0.30  CREATININE 2.50* 2.59* 3.07*  CKTOTAL -- -- --  CKMB -- -- --  TROPONINI -- -- --   Estimated Creatinine Clearance: 31.5 ml/min (by C-G formula based on Cr of 2.5).   Basename 03/30/12 1130  VANCOTROUGH 32.4*  VANCOPEAK --  Drue Dun --  GENTTROUGH --  GENTPEAK --  GENTRANDOM --  TOBRATROUGH --  Nolen Mu --  TOBRARND --  AMIKACINPEAK --  AMIKACINTROU --  AMIKACIN --    Medications:  Anti-infectives     Start     Dose/Rate Route Frequency Ordered Stop   03/31/12 2000   vancomycin (VANCOCIN) 1,500 mg in sodium chloride 0.9 % 500 mL IVPB        1,500 mg 250 mL/hr over 120 Minutes Intravenous Every 48 hours 03/31/12 1831     03/28/12 1200   vancomycin (VANCOCIN) 1,500 mg in sodium chloride 0.9 % 500 mL IVPB  Status:  Discontinued        1,500 mg 250 mL/hr over 120 Minutes Intravenous Every 24 hours 03/27/12 1302 03/30/12 1313   03/27/12 1200   vancomycin (VANCOCIN) 2,000 mg in sodium chloride 0.9 % 500 mL IVPB        2,000 mg 250 mL/hr over 120 Minutes Intravenous  Once 03/27/12 1154 03/27/12 1417   03/27/12 1200  piperacillin-tazobactam (ZOSYN)  IVPB 3.375 g       3.375 g 12.5 mL/hr over 240 Minutes Intravenous Every 8 hours 03/27/12 1154            Assessment: 66 y.o. M on heparin for Afib with a therapeutic heparin level this a.m. Heparin was held 4/6 through today for bleeding from ET tube. Bleeding has stopped. I spoke with Dr. Johney Frame who agreed to restart heparin for Afib. Patient was therapeutic on 800 units/hr prior to stopping. CBC ok, plts trending down- will monitor (note trended down despite being off heparin x2 days). CHADS2 score = 2 (HTN).   Goal of Therapy:  Heparin level 0.3-0.7 units/ml   Plan:  1. Resume heparin at current rate of 800 units/hr (8 ml/hr) 2. Heparin level in 6hrs after restart (1500).  3. Daily heparin level, CBC with routine platelet monitoring per protocol 4. Will follow-up cardiology's plan for long-term anticoagulation in this new-onset Afib with CHADs2 of 2.  5. Will follow-up CCM's plan for duration of antibiotics.   Link Snuffer, PharmD, BCPS Clinical Pharmacist [phone] 04/02/2012 9:20 AM

## 2012-04-02 NOTE — Progress Notes (Addendum)
Nutrition Follow-up  Pt extubated this AM. S/p bedside swallow evaluation. Pt presents with a normal oral and oropharyngeal swallow with no overt s/s of aspiration. Per RN, ate 100% of his full liquid lunch tray. Advanced to a Carbohydrate Modified Medium Calorie diet at 1427.  Diet Order:  Full Liquids  Meds: Scheduled Meds:   . albumin human  25 g Intravenous Q6H  . amiodarone  150 mg Intravenous Once  . amLODipine  10 mg Per Tube Daily  . aspirin  81 mg Oral Daily  . folic acid  1 mg Oral Daily  . furosemide  60 mg Intravenous Q12H  . insulin aspart  0-9 Units Subcutaneous Q4H  . ipratropium  0.5 mg Nebulization Q6H  . levalbuterol  0.63 mg Nebulization Q6H  . metoprolol tartrate  50 mg Oral BID  . mulitivitamin with minerals  1 tablet Oral Daily  . pantoprazole  40 mg Oral Q1200  . piperacillin-tazobactam (ZOSYN)  IV  3.375 g Intravenous Q8H  . potassium chloride  40 mEq Per Tube TID  . potassium chloride  40 mEq Per Tube Once  . potassium chloride  40 mEq Per Tube Q4H  . sodium chloride  3 mL Intravenous Q12H  . thiamine  100 mg Oral Daily  . vancomycin  1,500 mg Intravenous Q48H  . DISCONTD: antiseptic oral rinse  15 mL Mouth Rinse QID  . DISCONTD: aspirin  81 mg Per Tube Daily  . DISCONTD: aspirin EC  81 mg Oral Daily  . DISCONTD: chlorhexidine  15 mL Mouth Rinse BID  . DISCONTD: docusate  100 mg Oral BID  . DISCONTD: feeding supplement  30 mL Per Tube QID  . DISCONTD: feeding supplement (PROMOTE)  1,000 mL Per Tube Q24H  . DISCONTD: folic acid  1 mg Oral Daily  . DISCONTD: folic acid  1 mg Per Tube Daily  . DISCONTD: free water  300 mL Per Tube Q6H  . DISCONTD: metoprolol tartrate  50 mg Per Tube BID  . DISCONTD: mulitivitamin  5 mL Per Tube Daily  . DISCONTD: mulitivitamin with minerals  1 tablet Oral Daily  . DISCONTD: pantoprazole  40 mg Oral Q1200  . DISCONTD: pantoprazole sodium  40 mg Per Tube Q1200  . DISCONTD: thiamine  100 mg Intravenous Daily  . DISCONTD:  thiamine  100 mg Oral Daily  . DISCONTD: thiamine  100 mg Per Tube Daily   Continuous Infusions:   . amiodarone (NEXTERONE PREMIX) 360 mg/200 mL dextrose 30 mg/hr (04/02/12 1333)  . amiodarone (NEXTERONE PREMIX) 360 mg/200 mL dextrose 30 mg/hr (04/02/12 0620)  . heparin 800 Units/hr (04/02/12 1007)  . DISCONTD: diltiazem (CARDIZEM) infusion Stopped (03/27/12 0955)  . DISCONTD: DOPamine Stopped (03/27/12 1732)  . DISCONTD: fentaNYL infusion INTRAVENOUS 75 mcg/hr (03/31/12 2200)  . DISCONTD: furosemide (LASIX) infusion 5 mg/hr (04/01/12 1048)  . DISCONTD: midazolam (VERSED) infusion 1 mg/hr (04/01/12 2301)  . DISCONTD: norepinephrine (LEVOPHED) Adult infusion Stopped (03/28/12 0200)   PRN Meds:.sodium chloride, Acetaminophen, LORazepam, ondansetron (ZOFRAN) IV, sodium chloride, DISCONTD: acetaminophen, DISCONTD: fentaNYL, DISCONTD: fentaNYL, DISCONTD: midazolam, DISCONTD: midazolam  Labs:  CMP     Component Value Date/Time   NA 151* 04/02/2012 0400   K 3.2* 04/02/2012 0400   CL 108 04/02/2012 0400   CO2 30 04/02/2012 0400   GLUCOSE 125* 04/02/2012 0400   BUN 66* 04/02/2012 0400   CREATININE 2.50* 04/02/2012 0400   CALCIUM 9.8 04/02/2012 0400   PROT 6.5 03/29/2012 1430   ALBUMIN 2.6* 03/29/2012 1430  AST 42* 03/29/2012 1430   ALT 101* 03/29/2012 1430   ALKPHOS 104 03/29/2012 1430   BILITOT 0.6 03/29/2012 1430   GFRNONAA 26* 04/02/2012 0400   GFRAA 30* 04/02/2012 0400     Intake/Output Summary (Last 24 hours) at 04/02/12 1412 Last data filed at 04/02/12 0744  Gross per 24 hour  Intake 2688.2 ml  Output   4801 ml  Net -2112.8 ml    CBG (last 3)   Basename 04/02/12 1210 04/02/12 0749 04/02/12 0312  GLUCAP 142* 132* 113*    Weight Status:  92 kg (4/8) -- trending down  Re-estimated needs:  2000-2200 kcals, 110-120 gm protein  Nutrition Dx:  Inadequate Oral Intake, no longer applicable New Nutrition Dx: Inadequate Protein-Energy Intake r/t full liquids as evidenced by provision of ~ 1200 kcals, 40  gm protein  Goal:  Oral intake to meet >90% of estimated nutrition needs, currently unmet Monitor: PO intake, weight, labs, I/O's  Intervention:    No nutrition intervention indicated at this time  RD to follow for nutrition care plan  Alger Memos Pager #:  [phone]

## 2012-04-02 NOTE — Progress Notes (Signed)
Martin [name] is a 66 y.o. male admitted on 03/26/2012 with dypsnea, leg swelling and cough.  He had SVT/a fib-flutter with elevated BP and CHF in ED.  Hospital day 2 he developed respiratory failure and hypotension requiring pressors, and PCCM consulted. PMHx HTN, ETOH  Line/tube: ETT 4/2>>4/6, 4/6>>4/8 Lt IJ CVL 4/2>> Rt radial aline 4/2>>4/8  Cultures: Sputum 4/2>>negative Urine 4/2>>negative Blood 4/2>>  Antibiotics: Vancomycin 4/2>> Zosyn 4/2>>  Tests/events: 4/2 Echo>>EF 30 to 35%, mild LVH, moderate MR 4/6 Bloody secretions from ETT>>heparin held  SUBJECTIVE: Intermittent A fib.  Tolerating SBT.    OBJECTIVE:  Blood pressure 122/70, pulse 110, temperature 97.1 F (36.2 C), temperature source Axillary, resp. rate 18, height 5\' 5"  (1.651 m), weight 202 lb 13.2 oz (92 kg), SpO2 100.00%. Wt Readings from Last 3 Encounters:  04/02/12 202 lb 13.2 oz (92 kg)   Body mass index is 33.75 kg/(m^2).  I/O last 3 completed shifts: In: 5896.3 [I.V.:1756.3; NG/GT:3140; IV Piggyback:1000] Out: 6731 [Urine:6730; Stool:1]  Vent Mode:  [-] CPAP FiO2 (%):  [29.7 %-99.9 %] 30 % Set Rate:  [20 bmp] 20 bmp Vt Set:  [490 mL] 490 mL PEEP:  [4.7 cmH20-5.6 cmH20] 5 cmH20 Pressure Support:  [5 cmH20-10 cmH20] 5 cmH20 Plateau Pressure:  [23 cmH20-30 cmH20] 23 cmH20   Physical Exam: General - no distress HEENT - ETT in place, IJ site clean, panda in place Cardiac - s1s2 irregular, 2/6 SM Chest - scattered rhonchi, basilar rales Rt > Lt Abd - soft, mild distention, +BS but decreased Ext - 1+ edema Neuro - follows commands, moves all extremities  CBC    Component Value Date/Time   WBC 7.7 04/02/2012 0400   RBC 4.34 04/02/2012 0400   HGB 12.6* 04/02/2012 0400   HCT 39.7 04/02/2012 0400   PLT 132* 04/02/2012 0400   MCV 91.5 04/02/2012 0400   MCH 29.0 04/02/2012 0400   MCHC 31.7 04/02/2012 0400   RDW 15.5 04/02/2012 0400   LYMPHSABS 1.0 03/26/2012 1715   MONOABS 0.9 03/26/2012 1715   EOSABS 0.0  03/26/2012 1715   BASOSABS 0.0 03/26/2012 1715    BMET    Component Value Date/Time   NA 151* 04/02/2012 0400   K 3.2* 04/02/2012 0400   CL 108 04/02/2012 0400   CO2 30 04/02/2012 0400   GLUCOSE 125* 04/02/2012 0400   BUN 66* 04/02/2012 0400   CREATININE 2.50* 04/02/2012 0400   CALCIUM 9.8 04/02/2012 0400   GFRNONAA 26* 04/02/2012 0400   GFRAA 30* 04/02/2012 0400    Lab Results  Component Value Date   ALT 101* 03/29/2012   AST 42* 03/29/2012   ALKPHOS 104 03/29/2012   BILITOT 0.6 03/29/2012   ABG    Component Value Date/Time   PHART 7.505* 03/31/2012 0427   PCO2ART 34.1* 03/31/2012 0427   PO2ART 148.0* 03/31/2012 0427   HCO3 26.9* 03/31/2012 0427   TCO2 28 03/31/2012 0427   ACIDBASEDEF 3.0* 03/28/2012 0352   O2SAT 99.0 03/31/2012 0427     Dg Chest Port 1 View  04/01/2012  *RADIOLOGY REPORT*  Clinical Data: Evaluate ET tube placement  PORTABLE CHEST - 1 VIEW  Comparison: 03/31/2012  Findings: The endotracheal tube tip is above the carina.  There is a left IJ catheter with tip in the projection of the SVC. Feeding tube is looped within the body of the stomach.  There are bilateral pleural effusions present right greater than left.  Diffuse bilateral hazy lung opacities are unchanged from previous exam.  IMPRESSION:  1.  Stable position of endotracheal tube. 2.  Persistent bilateral pleural effusions and decreased aeration of both lungs.  Original Report Authenticated By: Rosealee Albee, M.D.   Dg Chest Port 1 View  03/31/2012  *RADIOLOGY REPORT*  Clinical Data: Endotracheal tube placement  PORTABLE CHEST - 1 VIEW  Comparison: 03/31/2012  Findings: Stable endotracheal and NG tube position.  Again noted opacification of the right hemithorax due to atelectasis, infiltrate or right pleural effusion.  Left IJ catheter is unchanged in position.  Cardiomegaly again noted.  No convincing pulmonary edema.  IMPRESSION: Stable support apparatus.  Again noted opacification of the right hemithorax probable due to combination right  pleural effusion with atelectasis or infiltrate.  No convincing pulmonary edema. Cardiomegaly again noted.  Original Report Authenticated By: Natasha Mead, M.D.   Dg Abd Portable 1v  03/31/2012  *RADIOLOGY REPORT*  Clinical Data: Panda placement  PORTABLE ABDOMEN - 1 VIEW  Comparison: None.  Findings: [name] is coiled in the stomach with the tip in the body of the stomach.  NG tube also in the body of the stomach.  Mild ileus.  IMPRESSION: Panda tube and NG tube are in the body of the stomach.  Original Report Authenticated By: Camelia Phenes, M.D.    ASSESSMENT/PLAN:  Acute respiratory failure likely from acute pulmonary edema; pneumonia less likely -extubate 4/8 -BPAP prn, goal SpO2 > 92% -keep in negative fluid balance as tolerated>>change from lasix gtt to lasix IV pushes -continue Abx for now>>if blood cx negative, then will d/c -f/u CXR  A fib/flutter, Acute systolic CHF, HTN -continue aspirin, norvasc, lopressor per cardiology -heparin gtt held 4/6 due to bloody secretions from ETT>>should be okay to resume>>defer to cardiology if this is needed -amiodarone per cardiology  ETOH -continue thiamine, folic acid -prn Ativan after extubation  Hypernatremia -f/u BMET  Acute on chronic renal failure -monitor renal fx, urine outpt while on lasix   Hypokalemia -f/u and replace as needed  Protein calorie malnutrition -assess swallow after extubation, and then advance diet  Critical care time 40 minutes.   Ted Goodner Pager:  [phone] 04/02/2012, 8:30 AM

## 2012-04-03 ENCOUNTER — Inpatient Hospital Stay (HOSPITAL_COMMUNITY): Payer: Medicaid Other

## 2012-04-03 LAB — CBC
HCT: 47.3 % (ref 39.0–52.0)
MCH: 28.8 pg (ref 26.0–34.0)
MCHC: 30.9 g/dL (ref 30.0–36.0)
RDW: 15.2 % (ref 11.5–15.5)
RDW: 15.1 % (ref 11.5–15.5)
WBC: 9.4 10*3/uL (ref 4.0–10.5)

## 2012-04-03 LAB — PROTIME-INR
INR: 1.15 (ref 0.00–1.49)
Prothrombin Time: 14.9 seconds (ref 11.6–15.2)

## 2012-04-03 LAB — BASIC METABOLIC PANEL
BUN: 52 mg/dL — ABNORMAL HIGH (ref 6–23)
Creatinine, Ser: 2.35 mg/dL — ABNORMAL HIGH (ref 0.50–1.35)
GFR calc Af Amer: 32 mL/min — ABNORMAL LOW (ref >90)
GFR calc non Af Amer: 28 mL/min — ABNORMAL LOW (ref >90)

## 2012-04-03 LAB — GLUCOSE, CAPILLARY
Glucose-Capillary: 74 mg/dL (ref 70–99)
Glucose-Capillary: 146 mg/dL — ABNORMAL HIGH (ref 70–99)
Glucose-Capillary: 105 mg/dL — ABNORMAL HIGH (ref 70–99)

## 2012-04-03 LAB — HEPARIN LEVEL (UNFRACTIONATED)
Heparin Unfractionated: 0.5 IU/mL (ref 0.30–0.70)
Heparin Unfractionated: 0.53 IU/mL (ref 0.30–0.70)

## 2012-04-03 MED ORDER — WARFARIN SODIUM 7.5 MG PO TABS
7.5000 mg | ORAL_TABLET | Freq: Once | ORAL | Status: AC
  Administered 2012-04-03: 7.5 mg via ORAL
  Filled 2012-04-03: qty 1

## 2012-04-03 MED ORDER — LEVALBUTEROL HCL 0.63 MG/3ML IN NEBU
0.6300 mg | INHALATION_SOLUTION | RESPIRATORY_TRACT | Status: DC | PRN
  Filled 2012-04-03: qty 3

## 2012-04-03 MED ORDER — HEPARIN (PORCINE) IN NACL 100-0.45 UNIT/ML-% IJ SOLN
950.0000 [IU]/h | INTRAMUSCULAR | Status: DC
Start: 2012-04-03 — End: 2012-04-06
  Administered 2012-04-03 – 2012-04-05 (×3): 950 [IU]/h via INTRAVENOUS
  Filled 2012-04-03 (×4): qty 250

## 2012-04-03 MED ORDER — AMIODARONE HCL 200 MG PO TABS
400.0000 mg | ORAL_TABLET | Freq: Two times a day (BID) | ORAL | Status: DC
  Administered 2012-04-03 – 2012-04-12 (×19): 400 mg via ORAL
  Filled 2012-04-03 (×22): qty 2

## 2012-04-03 MED ORDER — COUMADIN BOOK
Freq: Once | Status: AC
  Administered 2012-04-03: 17:00:00
  Filled 2012-04-03: qty 1

## 2012-04-03 MED ORDER — WARFARIN VIDEO
Freq: Once | Status: AC
  Administered 2012-04-03: 18:00:00

## 2012-04-03 MED ORDER — IPRATROPIUM BROMIDE 0.02 % IN SOLN: 0.5000 mg | RESPIRATORY_TRACT | Status: DC | PRN

## 2012-04-03 MED ORDER — WARFARIN - PHARMACIST DOSING INPATIENT
Freq: Every day | Status: DC
  Administered 2012-04-05: 18:00:00

## 2012-04-03 MED ORDER — POTASSIUM CHLORIDE CRYS ER 20 MEQ PO TBCR
40.0000 meq | EXTENDED_RELEASE_TABLET | ORAL | Status: AC
  Administered 2012-04-03 (×2): 40 meq via ORAL
  Filled 2012-04-03 (×2): qty 2

## 2012-04-03 NOTE — Progress Notes (Signed)
Subjective: Doing well s/p extubation yesterday Denies CP or SOB  Physical Exam: Filed Vitals:   04/03/12 0600 04/03/12 0732 04/03/12 0800 04/03/12 0820  BP: 129/78     Pulse: 55     Temp:  96.3 F (35.7 C)    TempSrc:  Oral    Resp: 17     Height:      Weight:      SpO2: 99%  96% 98%    GEN- The patient is chronically ill appearing, up to chair Head- normocephalic, atraumatic Eyes-  Sclera clear, conjunctiva pink Ears- hearing intact Oropharynx- OP clear Lungs- very poor air movement with short expiratory phase, bibasilar rales Heart- regular rate and rhythm  GI- soft, NT, ND, + BS Extremities- no clubbing, cyanosis, 2+edema MS- no significant deformity or atrophy  Testing -   Lab Results  Component Value Date   WBC 9.4 04/03/2012   HGB 15.1 04/03/2012   HCT 47.3 04/03/2012   MCV 92.2 04/03/2012   PLT 140* 04/03/2012    Lab Results  Component Value Date   CREATININE 2.35* 04/03/2012   BUN 52* 04/03/2012   NA 145 04/03/2012   K 3.5 04/03/2012   CL 99 04/03/2012   CO2 37* 04/03/2012    Lab Results  Component Value Date   CKTOTAL 96 03/27/2012   CKMB 7.3* 03/27/2012   TROPONINI <0.30 03/27/2012   Current Medications    . amiodarone  400 mg Oral BID  . amLODipine  10 mg Per Tube Daily  . aspirin  81 mg Oral Daily  . folic acid  1 mg Oral Daily  . furosemide  60 mg Intravenous Q12H  . insulin aspart  0-9 Units Subcutaneous Q4H  . ipratropium  0.5 mg Nebulization Q6H  . levalbuterol  0.63 mg Nebulization Q6H  . metoprolol tartrate  50 mg Oral BID  . mulitivitamin with minerals  1 tablet Oral Daily  . pantoprazole  40 mg Oral Q1200  . piperacillin-tazobactam (ZOSYN)  IV  3.375 g Intravenous Q8H  . potassium chloride  40 mEq Per Tube Q4H  . sodium chloride  3 mL Intravenous Q12H  . thiamine  100 mg Oral Daily  . vancomycin  1,500 mg Intravenous Q48H  . DISCONTD: antiseptic oral rinse  15 mL Mouth Rinse QID  . DISCONTD: aspirin  81 mg Per Tube Daily  . DISCONTD:  chlorhexidine  15 mL Mouth Rinse BID  . DISCONTD: feeding supplement  30 mL Per Tube QID  . DISCONTD: folic acid  1 mg Per Tube Daily  . DISCONTD: metoprolol tartrate  50 mg Per Tube BID  . DISCONTD: mulitivitamin  5 mL Per Tube Daily  . DISCONTD: pantoprazole  40 mg Oral Q1200  . DISCONTD: pantoprazole sodium  40 mg Per Tube Q1200  . DISCONTD: thiamine  100 mg Per Tube Daily    Assessment:  1. SVT/AF, presently rate controlled on Amiodarone, Metoprolol and Heparin.  Convert to oral amiodarone today,  Start coumadin per pharmacy  2. Cardiomyopathy, LVEF 30-35%- likely due to heavy ETOH. Further workup deferred at this point until clinically improved.  Would avoid cath with renal failure.  Outpt myoview may be appropriate.  Still volume overload,continue aggressive diuresis as renal function allows  3. VDRF, appreciate ongoing management by CCM. Outpt sleep study per Dr Craige Cotta  4. ARF, stable creatinine with lasix, though renal function is guarded  5. Hypertension. Stable  6. Hypokalemia- improved  DC foley and CVL Possibly to telemetry  later today or tomorrow.  Will require several more days of IV diuresis   Luellen Howson,MD 9:15 AM 04/03/2012

## 2012-04-03 NOTE — Progress Notes (Addendum)
ANTICOAGULATION CONSULT NOTE - Follow Up Consult  Pharmacy Consult for IV heparin and adding Coumadin Indication: atrial fibrillation  No Known Allergies  Patient Measurements: Height: 5\' 5"  (165.1 cm) Weight: 209 lb 10.5 oz (95.1 kg) IBW/kg (Calculated) : 61.5  Heparin Dosing Weight: 84 kg  Vital Signs: Temp: 96.3 F (35.7 C) (04/09 0732) Temp src: Oral (04/09 0732) BP: 141/103 mmHg (04/09 0900) Pulse Rate: 105  (04/09 0900)  Labs:  Basename 04/03/12 0831 04/03/12 0415 04/02/12 2237 04/02/12 1503 04/02/12 0400 04/01/12 0430  HGB 15.1 14.3 -- -- -- --  HCT 47.3 46.3 -- -- 39.7 --  PLT 140* 144* -- -- 132* --  APTT -- -- -- -- -- --  LABPROT -- -- -- -- -- --  INR -- -- -- -- -- --  HEPARINUNFRC 0.50 -- 0.46 <0.10* -- --  CREATININE -- 2.35* -- -- 2.50* 2.59*  CKTOTAL -- -- -- -- -- --  CKMB -- -- -- -- -- --  TROPONINI -- -- -- -- -- --   Estimated Creatinine Clearance: 34.1 ml/min (by C-G formula based on Cr of 2.35).   Medications:  Infusions:     . heparin 1,000 Units/hr (04/02/12 1618)  . DISCONTD: amiodarone (NEXTERONE PREMIX) 360 mg/200 mL dextrose 30 mg/hr (04/02/12 1333)  . DISCONTD: amiodarone (NEXTERONE PREMIX) 360 mg/200 mL dextrose 30 mg/hr (04/03/12 0141)    Assessment: 65yo AA male resumed on IV heparin for AFib on 04/02/12 and now adding Coumadin overlap day 1 of 5. HL is therapeutic this am on 1000 units/hr (58ml/hr). Level is rising- will monitor. CBC wnl. No bleeding noted. Extubated 04/02/12- doing well and changing to oral medications. INR 1.21 on 4/1 >>1.34 on 03/28/12. No prior history of Coumadin. Coumadin point score =8. LFTs elevated on 03/29/12 and albumin was 2.6. No further repeats. On oral amiodarone which can increase levels of Coumadin.   Goal of Therapy:  Heparin level 0.3-0.7 units/ml Coumadin goal INR 2-3  Plan:  1. Continue heparin to 1000 units/hr (10 ml/hr). 2. Follow-up am heparin level. 3. Recheck INR as elevated on admit and saw  a rise despite no Coumadin given. If INR ok, will give Coumadin 7.5mg  po x1 tonight and follow-up in AM.   Link Snuffer, PharmD, BCPS Clinical Pharmacist [phone] 04/03/2012,9:48 AM   Addendum:  Repeat INR 1.15.  Per CCM (Dr. Craige Cotta) patient does not require thoracentesis. Per RN patient now having pink tinge urine on IV heparin. Called Dr. Johney Frame with Cardiology- ok with continuing both IV heparin and Coumadin. Will monitor for further changes. Ordered patient coumadin book and video and will follow-up for coumadin education.   Will follow-up Heparin level this pm to confirm as having pink urine.   Link Snuffer, PharmD, BCPS Clinical Pharmacist [phone] 04/03/2012, 12:50 PM

## 2012-04-03 NOTE — Plan of Care (Signed)
Problem: Phase II Progression Outcomes Goal: Date pt extubated/weaned off vent Outcome: Completed/Met Date Met:  04/03/12 04/02/12 0840

## 2012-04-03 NOTE — Progress Notes (Signed)
ANTICOAGULATION CONSULT NOTE - Follow Up Consult  Pharmacy Consult for IV heparin  Indication: atrial fibrillation  No Known Allergies  Patient Measurements: Height: 5\' 5"  (165.1 cm) Weight: 209 lb 10.5 oz (95.1 kg) IBW/kg (Calculated) : 61.5  Heparin Dosing Weight: 84 kg  Vital Signs: Temp: 98.1 F (36.7 C) (04/09 1518) Temp src: Oral (04/09 1518) BP: 148/94 mmHg (04/09 1800) Pulse Rate: 71  (04/09 1800)  Labs:  Basename 04/03/12 1700 04/03/12 0831 04/03/12 0830 04/03/12 0415 04/02/12 2237 04/02/12 0400 04/01/12 0430  HGB -- 15.1 -- 14.3 -- -- --  HCT -- 47.3 -- 46.3 -- 39.7 --  PLT -- 140* -- 144* -- 132* --  APTT -- -- -- -- -- -- --  LABPROT -- -- 14.9 -- -- -- --  INR -- -- 1.15 -- -- -- --  HEPARINUNFRC 0.53 0.50 -- -- 0.46 -- --  CREATININE -- -- -- 2.35* -- 2.50* 2.59*  CKTOTAL -- -- -- -- -- -- --  CKMB -- -- -- -- -- -- --  TROPONINI -- -- -- -- -- -- --   Estimated Creatinine Clearance: 34.1 ml/min (by C-G formula based on Cr of 2.35).   Medications:  Infusions:     . heparin 1,000 Units/hr (04/02/12 1618)  . DISCONTD: amiodarone (NEXTERONE PREMIX) 360 mg/200 mL dextrose 30 mg/hr (04/02/12 1333)  . DISCONTD: amiodarone (NEXTERONE PREMIX) 360 mg/200 mL dextrose 30 mg/hr (04/03/12 0141)    Assessment: 66yo AA male resumed on IV heparin for AFib on 04/02/12 and now adding Coumadin overlap day 1 of 5. HL is therapeutic this am on 1000 units/hr (55ml/hr). Level is rising- will monitor. CBC wnl. Since there is some blood blood tinged urine. Will reduce the dose slightly even though the level is therapeutic.   Goal of Therapy:  Heparin level 0.3-0.7 units/ml  Plan:  1. Decrease heparin to 950 units/hr 2. F/u with AM heparin level

## 2012-04-03 NOTE — Progress Notes (Signed)
Martin [name] is a 66 y.o. male admitted on 03/26/2012 with dypsnea, leg swelling and cough.  He had SVT/a fib-flutter with elevated BP and CHF in ED.  Hospital day 2 he developed respiratory failure and hypotension requiring pressors, and PCCM consulted. PMHx HTN, ETOH  Line/tube: ETT 4/2>>4/6, 4/6>>4/8 Lt IJ CVL 4/2>> Rt radial aline 4/2>>4/8  Cultures: Sputum 4/2>>negative Urine 4/2>>negative Blood 4/2>>negative  Antibiotics: Vancomycin 4/2>>4/9 Zosyn 4/2>>4/9  Tests/events: 4/2 Echo>>EF 30 to 35%, mild LVH, moderate MR 4/6 Bloody secretions from ETT>>heparin held  SUBJECTIVE: Denies chest pain, dyspnea, abd pain.  OBJECTIVE:  Blood pressure 129/78, pulse 55, temperature 96.3 F (35.7 C), temperature source Oral, resp. rate 17, height 5\' 5"  (1.651 m), weight 209 lb 10.5 oz (95.1 kg), SpO2 98.00%. Wt Readings from Last 3 Encounters:  04/03/12 209 lb 10.5 oz (95.1 kg)   Body mass index is 34.89 kg/(m^2).  I/O last 3 completed shifts: In: 5613.5 [P.O.:1920; I.V.:1841.5; NG/GT:890; IV Piggyback:962] Out: 29562 [phone]; Stool:5]      Physical Exam: General - no distress, sitting in chair HEENT - no sinus tenderness Cardiac - s1s2 irregular, 2/6 SM Chest - scattered rhonchi, basilar rales Rt > Lt Abd - soft, mild distention, +BS but decreased Ext - 1+ edema Neuro - follows commands, moves all extremities  CBC    Component Value Date/Time   WBC 8.0 04/03/2012 0415   RBC 4.97 04/03/2012 0415   HGB 14.3 04/03/2012 0415   HCT 46.3 04/03/2012 0415   PLT 144* 04/03/2012 0415   MCV 93.2 04/03/2012 0415   MCH 28.8 04/03/2012 0415   MCHC 30.9 04/03/2012 0415   RDW 15.2 04/03/2012 0415   LYMPHSABS 1.0 03/26/2012 1715   MONOABS 0.9 03/26/2012 1715   EOSABS 0.0 03/26/2012 1715   BASOSABS 0.0 03/26/2012 1715    BMET    Component Value Date/Time   NA 145 04/03/2012 0415   K 3.5 04/03/2012 0415   CL 99 04/03/2012 0415   CO2 37* 04/03/2012 0415   GLUCOSE 122* 04/03/2012 0415   BUN 52* 04/03/2012  0415   CREATININE 2.35* 04/03/2012 0415   CALCIUM 10.0 04/03/2012 0415   GFRNONAA 28* 04/03/2012 0415   GFRAA 32* 04/03/2012 0415    Lab Results  Component Value Date   ALT 101* 03/29/2012   AST 42* 03/29/2012   ALKPHOS 104 03/29/2012   BILITOT 0.6 03/29/2012     Dg Chest Port 1 View  04/03/2012  *RADIOLOGY REPORT*  Clinical Data: Pulmonary edema follow-up.  PORTABLE CHEST - 1 VIEW  Comparison: 04/02/2012.  Findings: The endotracheal tube has been removed. The previous enteric tube is no longer evident.  Tip of left internal jugular venous catheter terminates in lower portion of superior vena cava near cavoatrial junction.  No pneumothorax is evident.  There is stable cardiac silhouette enlargement.  There is continued hazy opacity projecting over the mid and lower portions of the right hemithorax which may be associated with right pleural effusion. There is minimal atelectasis.  No definite left pleural effusion is evident. There is mild vascular congestion pattern.  Osteophytes are seen in the spine.  IMPRESSION: Interval removal of endotracheal and enteric tubes.  Left internal jugular venous catheter in place without evidence of pneumothorax. Stable cardiac silhouette enlargement.  Continued hazy opacity projecting over the mid and lower portions of the right hemithorax which may be associated with right pleural effusion.  There is minimal atelectasis. Mild vascular congestion pattern.  Original Report Authenticated By: Crawford Givens, M.D.  Dg Chest Port 1 View  04/02/2012  *RADIOLOGY REPORT*  Clinical Data: Evaluate endotracheal tube placement.  PORTABLE CHEST - 1 VIEW  Comparison: Multiple priors, most recently chest x-ray 04/01/2012.  Findings: An endotracheal tube is in place with tip 2.8 cm above the carina. A feeding tube is seen coiled with tip in the stomach. Lung volumes are low.  There continues to be a large hazy opacity overlying much of the right hemithorax, compatible with a moderate - large  right-sided pleural effusion.  There are areas of associated atelectasis throughout much of the right mid and lower lung.  Probable small left-sided pleural effusion (unchanged). Mild pulmonary venous congestion without frank pulmonary edema. Heart size is mildly enlarged. The patient is rotated to the left on today's exam, resulting in distortion of the mediastinal contours and reduced diagnostic sensitivity and specificity for mediastinal pathology.  IMPRESSION: 1.  Allowing for slight differences in patient positioning, the radiographic appearance of the chest is essentially unchanged, as detailed above.  Original Report Authenticated By: Florencia Reasons, M.D.    ASSESSMENT/PLAN:  Acute respiratory failure likely from acute pulmonary edema; pneumonia less likely -extubated 4/8 -high clinical suspicion for OSA>>auto CPAP while in hospital, and will then need further eval as outpt -keep in negative fluid balance as tolerated -Will d/c Abx -Will assess right pleural effusion with bedside ultrasound -No hx of smoking or asthma>>change nebs to prn  A fib/flutter, Acute systolic CHF, HTN -continue aspirin, norvasc, lopressor per cardiology -heparin gtt/coumadin transition per cardiology -amiodarone per cardiology  ETOH -continue thiamine, folic acid -prn Ativan  Hypernatremia>>improved -f/u BMET  Acute on chronic renal failure -monitor renal fx, urine outpt while on lasix   Hypokalemia -f/u and replace as needed  Protein calorie malnutrition -CHO modified diet  Hyperglycemia -SSI  Disposition -Ok to transfer to tele -OK to d/c foley, CVL  PCCM will continue to follow after transfer to telemetry.  Erienne Spelman Pager:  [phone] 04/03/2012, 9:07 AM

## 2012-04-03 NOTE — Significant Event (Signed)
Bedside ultrasound of right pleural space performed.  Minimal pleural effusion.  Will defer thoracentesis for now.  Coralyn Helling, MD 04/03/2012, 11:03 AM Pager:  [phone]

## 2012-04-04 LAB — CBC
MCH: 29.2 pg (ref 26.0–34.0)
MCHC: 32.6 g/dL (ref 30.0–36.0)
Platelets: 166 10*3/uL (ref 150–400)
RBC: 5.62 MIL/uL (ref 4.22–5.81)

## 2012-04-04 LAB — GLUCOSE, CAPILLARY

## 2012-04-04 LAB — BASIC METABOLIC PANEL
CO2: 35 mEq/L — ABNORMAL HIGH (ref 19–32)
Calcium: 10.5 mg/dL (ref 8.4–10.5)
GFR calc Af Amer: 34 mL/min — ABNORMAL LOW (ref >90)
GFR calc non Af Amer: 29 mL/min — ABNORMAL LOW (ref >90)
Sodium: 137 mEq/L (ref 135–145)

## 2012-04-04 LAB — PROTIME-INR
INR: 1.07 (ref 0.00–1.49)
Prothrombin Time: 14.1 seconds (ref 11.6–15.2)

## 2012-04-04 MED ORDER — WARFARIN SODIUM 10 MG PO TABS
10.0000 mg | ORAL_TABLET | Freq: Once | ORAL | Status: AC
  Administered 2012-04-04: 10 mg via ORAL
  Filled 2012-04-04 (×2): qty 1

## 2012-04-04 NOTE — Progress Notes (Signed)
Report to 2000 RN 

## 2012-04-04 NOTE — Progress Notes (Signed)
1400 transferred to 4735 with staff in room to receive, pt ambulated on monitor and tolerated well, placed in bed, family at bedside

## 2012-04-04 NOTE — Progress Notes (Signed)
ANTICOAGULATION CONSULT NOTE - Follow Up Consult  Pharmacy Consult for IV heparin and adding Coumadin Indication: atrial fibrillation  No Known Allergies  Patient Measurements: Height: 5\' 5"  (165.1 cm) Weight: 202 lb 13.2 oz (92 kg) IBW/kg (Calculated) : 61.5  Heparin Dosing Weight: 84 kg  Vital Signs: Temp: 98 F (36.7 C) (04/10 0754) Temp src: Oral (04/10 0754) BP: 114/83 mmHg (04/10 0800) Pulse Rate: 67  (04/10 0900)  Labs:  Basename 04/04/12 0510 04/03/12 1700 04/03/12 0831 04/03/12 0830 04/03/12 0415 04/02/12 0400  HGB 16.4 -- 15.1 -- -- --  HCT 50.3 -- 47.3 -- 46.3 --  PLT 166 -- 140* -- 144* --  APTT -- -- -- -- -- --  LABPROT 14.1 -- -- 14.9 -- --  INR 1.07 -- -- 1.15 -- --  HEPARINUNFRC 0.36 0.53 0.50 -- -- --  CREATININE 2.25* -- -- -- 2.35* 2.50*  CKTOTAL -- -- -- -- -- --  CKMB -- -- -- -- -- --  TROPONINI -- -- -- -- -- --   Estimated Creatinine Clearance: 35 ml/min (by C-G formula based on Cr of 2.25).   Medications:  Infusions:     . heparin 950 Units/hr (04/03/12 2000)  . DISCONTD: heparin 1,000 Units/hr (04/02/12 1618)    Assessment: 65yo AA male on IV heparin and Coumadin for Afib- overlap day 2 of 5.  Anticoagulation: HL is therapeutic this am on 950 units/hr (9.56ml/hr). CBC wnl. INR 1.07 (decr). Coumadin 7.5mg  charted given on 4/9. On oral amiodarone which can increase levels of Coumadin. Coumadin score =8. Pink tinged urine per RN on 4/9- talked with Dr. Johney Frame, ok to continue. Follow-up today- urine looking more like normal (barely any pink) per RN.   Goal of Therapy:  Heparin level 0.3-0.7 units/ml Coumadin goal INR 2-3  Plan:  1. Continue heparin to 950 units/hr (9.5 ml/hr). 2. Follow-up am heparin level. 3. Coumadin 10mg  po x1 at 1800.  4. Follow-up PT/INR in am.   Link Snuffer, PharmD, BCPS Clinical Pharmacist [phone] 04/04/2012,10:05 AM

## 2012-04-04 NOTE — Progress Notes (Signed)
CPAP set up for pt to try because he's never worn before. Started at 10 cmH2O & he didn't feel like it was enough pressure. Increased to 12 cmH2O & pt wore for about 10 minutes. He said there was no way he could sleep with this on. Taken off at pt request.  Jacqulynn Cadet RRT

## 2012-04-04 NOTE — Progress Notes (Signed)
Patient ID: Martin [name], male   DOB: 01-30-1946, 66 y.o.   MRN: [id number] Chart reviewed. Holding NSR. OK to transfer to telemetry. No change in cardiac recs.

## 2012-04-04 NOTE — Progress Notes (Signed)
1320 attempted to call report to 4700

## 2012-04-04 NOTE — Progress Notes (Signed)
Martin [name] is a 66 y.o. male admitted on 03/26/2012 with dypsnea, leg swelling and cough.  He had SVT/a fib-flutter with elevated BP and CHF in ED.  Hospital day 2 he developed respiratory failure and hypotension requiring pressors, and PCCM consulted. PMHx HTN, ETOH  Line/tube: ETT 4/2>>4/6, 4/6>>4/8 Lt IJ CVL 4/2>> Rt radial aline 4/2>>4/8  Cultures: Sputum 4/2>>negative Urine 4/2>>negative Blood 4/2>>negative  Antibiotics: Vancomycin 4/2>>4/9 Zosyn 4/2>>4/9  Tests/events: 4/2 Echo>>EF 30 to 35%, mild LVH, moderate MR 4/6 Bloody secretions from ETT>>heparin held 4/9 Bedside ultrasound Rt chest>>minimal Rt pleural effusion  SUBJECTIVE: Did not get CPAP overnight.  Reports snoring, and waking up with choking sensation at home.  Also reports daytime sleepiness.  OBJECTIVE:  Blood pressure 114/83, pulse 65, temperature 98 F (36.7 C), temperature source Oral, resp. rate 18, height 5\' 5"  (1.651 m), weight 202 lb 13.2 oz (92 kg), SpO2 96.00%. Wt Readings from Last 3 Encounters:  04/04/12 202 lb 13.2 oz (92 kg)   Body mass index is 33.75 kg/(m^2).  I/O last 3 completed shifts: In: 3314.4 [P.O.:1640; I.V.:1052.4; IV Piggyback:622] Out: 8973 [Urine:8970; Stool:3]     Physical Exam: General - no distress, sitting in chair HEENT - no sinus tenderness Cardiac - s1s2, 2/6 SM Chest - scattered rhonchi Abd - soft, mild distention, +BS but decreased Ext - decreased edema Neuro - follows commands, moves all extremities  CBC    Component Value Date/Time   WBC 8.5 04/04/2012 0510   RBC 5.62 04/04/2012 0510   HGB 16.4 04/04/2012 0510   HCT 50.3 04/04/2012 0510   PLT 166 04/04/2012 0510   MCV 89.5 04/04/2012 0510   MCH 29.2 04/04/2012 0510   MCHC 32.6 04/04/2012 0510   RDW 14.4 04/04/2012 0510   LYMPHSABS 1.0 03/26/2012 1715   MONOABS 0.9 03/26/2012 1715   EOSABS 0.0 03/26/2012 1715   BASOSABS 0.0 03/26/2012 1715    BMET    Component Value Date/Time   NA 137 04/04/2012 0510   K 3.9  04/04/2012 0510   CL 91* 04/04/2012 0510   CO2 35* 04/04/2012 0510   GLUCOSE 123* 04/04/2012 0510   BUN 52* 04/04/2012 0510   CREATININE 2.25* 04/04/2012 0510   CALCIUM 10.5 04/04/2012 0510   GFRNONAA 29* 04/04/2012 0510   GFRAA 34* 04/04/2012 0510     Dg Chest Port 1 View  04/03/2012  *RADIOLOGY REPORT*  Clinical Data: Pulmonary edema follow-up.  PORTABLE CHEST - 1 VIEW  Comparison: 04/02/2012.  Findings: The endotracheal tube has been removed. The previous enteric tube is no longer evident.  Tip of left internal jugular venous catheter terminates in lower portion of superior vena cava near cavoatrial junction.  No pneumothorax is evident.  There is stable cardiac silhouette enlargement.  There is continued hazy opacity projecting over the mid and lower portions of the right hemithorax which may be associated with right pleural effusion. There is minimal atelectasis.  No definite left pleural effusion is evident. There is mild vascular congestion pattern.  Osteophytes are seen in the spine.  IMPRESSION: Interval removal of endotracheal and enteric tubes.  Left internal jugular venous catheter in place without evidence of pneumothorax. Stable cardiac silhouette enlargement.  Continued hazy opacity projecting over the mid and lower portions of the right hemithorax which may be associated with right pleural effusion.  There is minimal atelectasis. Mild vascular congestion pattern.  Original Report Authenticated By: Crawford Givens, M.D.    ASSESSMENT/PLAN:  Acute respiratory failure likely from acute pulmonary edema; pneumonia  less likely -extubated 4/8 -high clinical suspicion for OSA>>auto CPAP while in hospital, and will then need further eval as outpt -keep in negative fluid balance as tolerated -monitor off ABx -minimal effusion by bedside ultrasound 4/9>>will f/u PA/lateral CXR 4/11 -No hx of smoking or asthma>>changed nebs to prn  A fib/flutter, Acute systolic CHF, HTN -continue aspirin, norvasc,  lopressor per cardiology -heparin gtt/coumadin transition per cardiology -amiodarone per cardiology  ETOH -continue thiamine, folic acid -prn Ativan  Hypernatremia>>improved -f/u BMET  Acute on chronic renal failure -monitor renal fx, urine outpt while on lasix   Hypokalemia -f/u and replace as needed  Protein calorie malnutrition -CHO modified diet  Hyperglycemia -SSI  Disposition -Ok to transfer to tele  Need to ensure patient uses CPAP overnight, and then determine if further evaluation for sleep apnea needed as outpt.  PCCM will continue to follow after transfer to telemetry.  Martin [name] Pager:  [phone] 04/04/2012, 8:18 AM

## 2012-04-05 ENCOUNTER — Inpatient Hospital Stay (HOSPITAL_COMMUNITY): Payer: Medicaid Other

## 2012-04-05 DIAGNOSIS — I5021 Acute systolic (congestive) heart failure: Secondary | ICD-10-CM

## 2012-04-05 DIAGNOSIS — N189 Chronic kidney disease, unspecified: Secondary | ICD-10-CM | POA: Diagnosis present

## 2012-04-05 DIAGNOSIS — I4891 Unspecified atrial fibrillation: Secondary | ICD-10-CM

## 2012-04-05 DIAGNOSIS — G4733 Obstructive sleep apnea (adult) (pediatric): Secondary | ICD-10-CM

## 2012-04-05 DIAGNOSIS — N179 Acute kidney failure, unspecified: Secondary | ICD-10-CM | POA: Diagnosis present

## 2012-04-05 DIAGNOSIS — J9600 Acute respiratory failure, unspecified whether with hypoxia or hypercapnia: Secondary | ICD-10-CM

## 2012-04-05 DIAGNOSIS — I5023 Acute on chronic systolic (congestive) heart failure: Secondary | ICD-10-CM | POA: Diagnosis present

## 2012-04-05 DIAGNOSIS — F1010 Alcohol abuse, uncomplicated: Secondary | ICD-10-CM

## 2012-04-05 LAB — PROTIME-INR: INR: 1.32 (ref 0.00–1.49)

## 2012-04-05 LAB — GLUCOSE, CAPILLARY
Glucose-Capillary: 122 mg/dL — ABNORMAL HIGH (ref 70–99)
Glucose-Capillary: 126 mg/dL — ABNORMAL HIGH (ref 70–99)
Glucose-Capillary: 156 mg/dL — ABNORMAL HIGH (ref 70–99)

## 2012-04-05 LAB — CBC
HCT: 48.7 % (ref 39.0–52.0)
Hemoglobin: 15.9 g/dL (ref 13.0–17.0)
MCH: 29.2 pg (ref 26.0–34.0)
MCHC: 32.6 g/dL (ref 30.0–36.0)

## 2012-04-05 MED ORDER — METOPROLOL TARTRATE 50 MG PO TABS
50.0000 mg | ORAL_TABLET | Freq: Two times a day (BID) | ORAL | Status: DC
  Administered 2012-04-06 – 2012-04-07 (×2): 50 mg via ORAL
  Filled 2012-04-05 (×6): qty 1

## 2012-04-05 MED ORDER — WARFARIN SODIUM 10 MG PO TABS
10.0000 mg | ORAL_TABLET | Freq: Once | ORAL | Status: AC
  Administered 2012-04-05: 10 mg via ORAL
  Filled 2012-04-05: qty 1

## 2012-04-05 MED ORDER — DOCUSATE SODIUM 100 MG PO CAPS
100.0000 mg | ORAL_CAPSULE | Freq: Two times a day (BID) | ORAL | Status: DC
  Administered 2012-04-05 – 2012-04-17 (×23): 100 mg via ORAL
  Filled 2012-04-05 (×25): qty 1

## 2012-04-05 MED ORDER — MAGNESIUM HYDROXIDE NICU ORAL SYRINGE 400 MG/5 ML
30.0000 mL | Freq: Every day | ORAL | Status: DC | PRN
Start: 2012-04-05 — End: 2012-04-05

## 2012-04-05 MED ORDER — ACETAMINOPHEN 500 MG PO TABS
500.0000 mg | ORAL_TABLET | ORAL | Status: DC | PRN
  Administered 2012-04-05 – 2012-04-07 (×2): 500 mg via ORAL
  Filled 2012-04-05 (×2): qty 1

## 2012-04-05 MED ORDER — LISINOPRIL 2.5 MG PO TABS
2.5000 mg | ORAL_TABLET | Freq: Every day | ORAL | Status: DC
  Administered 2012-04-05 – 2012-04-07 (×2): 2.5 mg via ORAL
  Filled 2012-04-05 (×5): qty 1

## 2012-04-05 MED ORDER — MAGNESIUM HYDROXIDE 400 MG/5ML PO SUSP: 30.0000 mL | Freq: Every day | ORAL | Status: DC | PRN

## 2012-04-05 NOTE — Plan of Care (Signed)
Problem: Food- and Nutrition-Related Knowledge Deficit (NB-1.1) Goal: Nutrition education Formal process to instruct or train a patient/client in a skill or to impart knowledge to help patients/clients voluntarily manage or modify food choices and eating behavior to maintain or improve health.  Outcome: Completed/Met Date Met:  04/05/12 RD consulted for CHF diet edu. Pt has had education with HF booklet previously. RD went over low sodium nutrition therapy handout with pt. Pt states he is following a low sodium diet at home. Pt verbalized understanding of recommendations.   Clarene Duke MARIE

## 2012-04-05 NOTE — Progress Notes (Signed)
Martin [name] is a 66 y.o. male admitted on 03/26/2012 with dypsnea, leg swelling and cough.  He had SVT/a fib-flutter with elevated BP and CHF in ED.  Hospital day 2 he developed respiratory failure and hypotension requiring pressors, and PCCM consulted. PMHx HTN, ETOH  Line/tube: ETT 4/2>>4/6, 4/6>>4/8 Lt IJ CVL 4/2>>4/10 Rt radial aline 4/2>>4/8  Cultures: Sputum 4/2>>negative Urine 4/2>>negative Blood 4/2>>negative  Antibiotics: Vancomycin 4/2>>4/9 Zosyn 4/2>>4/9  Tests/events: 4/2 Echo>>EF 30 to 35%, mild LVH, moderate MR 4/6 Bloody secretions from ETT>>heparin held 4/9 Bedside ultrasound Rt chest>>minimal Rt pleural effusion  SUBJECTIVE: Very sleepy this am.  States he cannot wear CPAP.  "It cuts off my air, I cant breathe with it on".   OBJECTIVE:  Blood pressure 96/69, pulse 104, temperature 97.4 F (36.3 C), temperature source Oral, resp. rate 20, height 5\' 5"  (1.651 m), weight 201 lb 6.4 oz (91.354 kg), SpO2 98.00%. Wt Readings from Last 3 Encounters:  04/05/12 201 lb 6.4 oz (91.354 kg)   Body mass index is 33.51 kg/(m^2).  I/O last 3 completed shifts: In: 1957.5 [P.O.:1670; I.V.:275.5; IV Piggyback:12] Out: 5425 [Urine:5425]     Physical Exam: General - no distress in bed HEENT - mm dry, no sinus tenderness Cardiac - s1s2, 2/6 SM Chest - resps even, non labored, few scattered rhonchi Abd - soft, mild distention, +BS but decreased Ext - decreased edema Neuro - sleepy but arousable, follows commands, moves all extremities  CBC    Component Value Date/Time   WBC 8.4 04/05/2012 0619   RBC 5.45 04/05/2012 0619   HGB 15.9 04/05/2012 0619   HCT 48.7 04/05/2012 0619   PLT 176 04/05/2012 0619   MCV 89.4 04/05/2012 0619   MCH 29.2 04/05/2012 0619   MCHC 32.6 04/05/2012 0619   RDW 14.2 04/05/2012 0619   LYMPHSABS 1.0 03/26/2012 1715   MONOABS 0.9 03/26/2012 1715   EOSABS 0.0 03/26/2012 1715   BASOSABS 0.0 03/26/2012 1715    BMET    Component Value Date/Time   NA 137  04/04/2012 0510   K 3.9 04/04/2012 0510   CL 91* 04/04/2012 0510   CO2 35* 04/04/2012 0510   GLUCOSE 123* 04/04/2012 0510   BUN 52* 04/04/2012 0510   CREATININE 2.25* 04/04/2012 0510   CALCIUM 10.5 04/04/2012 0510   GFRNONAA 29* 04/04/2012 0510   GFRAA 34* 04/04/2012 0510     No results found.  ASSESSMENT/PLAN:  Acute respiratory failure likely from acute pulmonary edema; pneumonia less likely with ?underlying OSA -extubated 4/8 -high clinical suspicion for OSA>>not able to tolerate auto CPAP -keep in negative fluid balance as tolerated -monitor off ABx -minimal effusion by bedside ultrasound 4/9>>f/u CXR intermittently -No hx of smoking or asthma>>nebs prn  A fib/flutter, Acute systolic CHF, HTN -continue aspirin, norvasc, lopressor per cardiology -heparin gtt/coumadin transition per cardiology -amiodarone per cardiology -lisinopril added 4/11 per cards   ETOH -continue thiamine, folic acid -prn Ativan  Hypernatremia>>resolved -f/u BMET  Acute on chronic renal failure -monitor renal fx, urine outpt while on lasix   Hypokalemia -f/u and replace as needed  Protein calorie malnutrition -CHO modified diet  Hyperglycemia -SSI   WHITEHEART,KATHRYN, NP 04/05/2012  9:50 AM Pager: [phone]  Reviewed above, examined pt, and agree with assessment/plan.    He was not able to tolerate auto CPAP last night, and is reluctant to use again unless more definite dx of OSA is established.  Will d/c CPAP for now.  Recommend he have arrangement for sleep study as outpt  to further assess for sleep disordered breathing.  If he is found to have OSA, then referral back to pulmonary can be made.  Otherwise, no additional pulmonary inpatient interventions needed at this time.  PCCM will sign off.  Please call if additional help needed.  Coralyn Helling, MD 04/05/2012, 10:07 AM Pager:  [phone]

## 2012-04-05 NOTE — Progress Notes (Signed)
ANTICOAGULATION CONSULT NOTE - Follow Up Consult  Pharmacy Consult for IV heparin and adding Coumadin Indication: atrial fibrillation  No Known Allergies  Patient Measurements: Height: 5\' 5"  (165.1 cm) Weight: 201 lb 6.4 oz (91.354 kg) (b scale) IBW/kg (Calculated) : 61.5  Heparin Dosing Weight: 84 kg  Vital Signs: Temp: 97.4 F (36.3 C) (04/11 0542) Temp src: Oral (04/11 0542) BP: 114/78 mmHg (04/11 1001) Pulse Rate: 94  (04/11 1001)  Labs:  Basename 04/05/12 0619 04/04/12 0510 04/03/12 1700 04/03/12 0831 04/03/12 0830 04/03/12 0415  HGB 15.9 16.4 -- -- -- --  HCT 48.7 50.3 -- 47.3 -- --  PLT 176 166 -- 140* -- --  APTT -- -- -- -- -- --  LABPROT 16.6* 14.1 -- -- 14.9 --  INR 1.32 1.07 -- -- 1.15 --  HEPARINUNFRC 0.33 0.36 0.53 -- -- --  CREATININE -- 2.25* -- -- -- 2.35*  CKTOTAL -- -- -- -- -- --  CKMB -- -- -- -- -- --  TROPONINI -- -- -- -- -- --   Estimated Creatinine Clearance: 34.9 ml/min (by C-G formula based on Cr of 2.25).   Medications:  Infusions:     . heparin 950 Units/hr (04/04/12 1333)    Assessment: 66yo AA male on IV heparin and Coumadin for Afib.  Heparin level is therapeutic on 950 units/hr.  INR is rising nicely after 2 dose of Coumadin.  No furthr bleeding/hematuria noted.  Goal of Therapy:  Heparin level 0.3-0.7 units/ml Coumadin goal INR 2-3  Plan:  1. Continue heparin to 950 units/hr (9.5 ml/hr). 2. Follow-up am heparin level. 3. Repeat Coumadin 10mg  po x1 at 1800.  4. Follow-up PT/INR in am.   Dixie Dials, Pharm.D., BCPS Clinical Pharmacist Pager [phone] 04/05/2012 12:42 PM

## 2012-04-05 NOTE — Progress Notes (Signed)
Pt tried to use CPAP but was not able to tolerate it, stated that he will not be able to sleep and refused to wear it overnight.

## 2012-04-05 NOTE — Progress Notes (Signed)
Per NSMT, pt converted to NSR. 12 lead EKG done confirming NSR. Md notified. Will continue to monitor.

## 2012-04-05 NOTE — Progress Notes (Signed)
   Subjective: Doing well,  Edema is slightly improved.  Denies CP or SOB  Physical Exam: Filed Vitals:   04/04/12 1230 04/04/12 1300 04/04/12 2228 04/05/12 0542  BP: 138/86 109/76 120/75 96/69  Pulse: 69 68 82 104  Temp: 97.5 F (36.4 C)  98 F (36.7 C) 97.4 F (36.3 C)  TempSrc: Oral  Oral Oral  Resp: 20 20 18 20   Height: 5\' 5"  (1.651 m)     Weight: 200 lb 13.4 oz (91.1 kg)   201 lb 6.4 oz (91.354 kg)  SpO2: 100% 94% 94% 98%    GEN- The patient is chronically ill appearing, up to chair Head- normocephalic, atraumatic Eyes-  Sclera clear, conjunctiva pink Ears- hearing intact Oropharynx- OP clear JVP 11cm Lungs- few bibasilar rales, improving Heart- irregular rate and rhythm  GI- soft, NT, ND, + BS Extremities- no clubbing, cyanosis, 2+edema MS- no significant deformity or atrophy  Testing -   Lab Results  Component Value Date   WBC 8.4 04/05/2012   HGB 15.9 04/05/2012   HCT 48.7 04/05/2012   MCV 89.4 04/05/2012   PLT 176 04/05/2012    Lab Results  Component Value Date   CREATININE 2.25* 04/04/2012   BUN 52* 04/04/2012   NA 137 04/04/2012   K 3.9 04/04/2012   CL 91* 04/04/2012   CO2 35* 04/04/2012    Lab Results  Component Value Date   CKTOTAL 96 03/27/2012   CKMB 7.3* 03/27/2012   TROPONINI <0.30 03/27/2012   Current Medications    . amiodarone  400 mg Oral BID  . amLODipine  10 mg Per Tube Daily  . aspirin  81 mg Oral Daily  . folic acid  1 mg Oral Daily  . furosemide  60 mg Intravenous Q12H  . insulin aspart  0-9 Units Subcutaneous Q4H  . lisinopril  2.5 mg Oral Daily  . metoprolol tartrate  50 mg Oral BID  . mulitivitamin with minerals  1 tablet Oral Daily  . pantoprazole  40 mg Oral Q1200  . sodium chloride  3 mL Intravenous Q12H  . thiamine  100 mg Oral Daily  . warfarin  10 mg Oral ONCE-1800  . Warfarin - Pharmacist Dosing Inpatient   Does not apply q1800    Assessment:  1. AF, presently rate controlled on Amiodarone, Metoprolol and Heparin.    Continue coumadin per pharmacy (goal INR 2-3)  2. Cardiomyopathy, LVEF 30-35%- likely due to heavy ETOH. Further workup deferred at this point until clinically improved.  Would avoid cath with renal failure.  Outpt myoview may be appropriate at some point in the future.  Still volume overload,continue aggressive diuresis as renal function allows Might be able to convert to PO lasix tomorrow. Will add a low dose of lisinopril today and monitor BP/ renal function  3. VDRF, appreciate ongoing management by CCM. Outpt sleep study per Dr Craige Cotta  4. ARF, stable creatinine with lasix, though renal function is guarded Caution with ACE inhibitor  5. Hypertension. Stable  6. Hypokalemia- improved   Jarold Song 8:34 AM 04/05/2012

## 2012-04-06 ENCOUNTER — Inpatient Hospital Stay (HOSPITAL_COMMUNITY): Payer: Medicaid Other

## 2012-04-06 ENCOUNTER — Encounter (HOSPITAL_COMMUNITY): Payer: Self-pay | Admitting: Radiology

## 2012-04-06 DIAGNOSIS — M7981 Nontraumatic hematoma of soft tissue: Secondary | ICD-10-CM

## 2012-04-06 DIAGNOSIS — S301XXA Contusion of abdominal wall, initial encounter: Secondary | ICD-10-CM

## 2012-04-06 LAB — PREPARE RBC (CROSSMATCH)

## 2012-04-06 LAB — CBC
Hemoglobin: 13.5 g/dL (ref 13.0–17.0)
MCH: 29.1 pg (ref 26.0–34.0)
MCH: 29.2 pg (ref 26.0–34.0)
MCHC: 33.7 g/dL (ref 30.0–36.0)
Platelets: 198 10*3/uL (ref 150–400)
Platelets: 173 10*3/uL (ref 150–400)
RBC: 4.64 MIL/uL (ref 4.22–5.81)
RBC: 3.97 MIL/uL — ABNORMAL LOW (ref 4.22–5.81)
WBC: 18.5 10*3/uL — ABNORMAL HIGH (ref 4.0–10.5)

## 2012-04-06 LAB — GLUCOSE, CAPILLARY
Glucose-Capillary: 110 mg/dL — ABNORMAL HIGH (ref 70–99)
Glucose-Capillary: 140 mg/dL — ABNORMAL HIGH (ref 70–99)
Glucose-Capillary: 147 mg/dL — ABNORMAL HIGH (ref 70–99)
Glucose-Capillary: 150 mg/dL — ABNORMAL HIGH (ref 70–99)
Glucose-Capillary: 172 mg/dL — ABNORMAL HIGH (ref 70–99)

## 2012-04-06 LAB — BASIC METABOLIC PANEL
Calcium: 9.1 mg/dL (ref 8.4–10.5)
GFR calc non Af Amer: 24 mL/min — ABNORMAL LOW (ref >90)
Glucose, Bld: 147 mg/dL — ABNORMAL HIGH (ref 70–99)
Sodium: 135 mEq/L (ref 135–145)

## 2012-04-06 LAB — HEMOGLOBIN AND HEMATOCRIT, BLOOD: HCT: 31.1 % — ABNORMAL LOW (ref 39.0–52.0)

## 2012-04-06 LAB — PROTIME-INR: Prothrombin Time: 19.9 seconds — ABNORMAL HIGH (ref 11.6–15.2)

## 2012-04-06 LAB — ABO/RH: ABO/RH(D): O POS

## 2012-04-06 MED ORDER — SODIUM CHLORIDE 0.9 % IV BOLUS (SEPSIS)
250.0000 mL | Freq: Once | INTRAVENOUS | Status: AC
  Administered 2012-04-06: 250 mL via INTRAVENOUS

## 2012-04-06 MED ORDER — PHYTONADIONE 5 MG PO TABS
2.5000 mg | ORAL_TABLET | Freq: Once | ORAL | Status: DC
  Filled 2012-04-06: qty 1

## 2012-04-06 MED ORDER — POTASSIUM CHLORIDE CRYS ER 20 MEQ PO TBCR
20.0000 meq | EXTENDED_RELEASE_TABLET | Freq: Once | ORAL | Status: AC
  Administered 2012-04-06: 20 meq via ORAL

## 2012-04-06 MED ORDER — ACETAMINOPHEN 325 MG PO TABS
650.0000 mg | ORAL_TABLET | Freq: Once | ORAL | Status: AC
  Administered 2012-04-06: 650 mg via ORAL
  Filled 2012-04-06: qty 2

## 2012-04-06 MED ORDER — SODIUM CHLORIDE 0.9 % IV BOLUS (SEPSIS)
500.0000 mL | Freq: Once | INTRAVENOUS | Status: AC
  Administered 2012-04-06: 500 mL via INTRAVENOUS

## 2012-04-06 MED ORDER — PHYTONADIONE 5 MG PO TABS
5.0000 mg | ORAL_TABLET | ORAL | Status: AC
  Administered 2012-04-06: 5 mg via ORAL
  Filled 2012-04-06: qty 1

## 2012-04-06 MED ORDER — POTASSIUM CHLORIDE CRYS ER 20 MEQ PO TBCR
EXTENDED_RELEASE_TABLET | ORAL | Status: AC
  Filled 2012-04-06: qty 1

## 2012-04-06 NOTE — Progress Notes (Signed)
66 yo AAM admitted with acute respiratory failure requiring intubation, renal failure, acute decomensated CHF, and afib.  Likely due to longstanding ETOH.  He returned to CCU 4/12 due to acute rectus sheath hematoma.  Subjective: Pt wa Denies CP or SOB  Physical Exam: Filed Vitals:   04/06/12 0800 04/06/12 0927 04/06/12 0930 04/06/12 1025  BP: 80/33  88/56 98/60  Pulse: 66 65 64 60  Temp:  98.4 F (36.9 C)    TempSrc:  Oral    Resp: 23 21 20 21   Height:      Weight:      SpO2: 100%  100% 97%    GEN- The patient is chronically ill appearing, more fatigued today but stable Head- normocephalic, atraumatic Eyes-  Sclera clear, conjunctiva pink Ears- hearing intact Oropharynx- OP clear JVP 93m Lungs- few bibasilar rales, improving Heart- RRR GI- moderately distended with R sided rectus sheath hematoma Extremities- no clubbing, cyanosis, 2+edema MS- no significant deformity or atrophy  Testing -   Lab Results  Component Value Date   WBC 13.0* 04/06/2012   HGB 13.5 04/06/2012   HCT 40.1 04/06/2012   MCV 86.4 04/06/2012   PLT 198 04/06/2012    Lab Results  Component Value Date   CREATININE 2.25* 04/04/2012   BUN 52* 04/04/2012   NA 137 04/04/2012   K 3.9 04/04/2012   CL 91* 04/04/2012   CO2 35* 04/04/2012    Lab Results  Component Value Date   CKTOTAL 96 03/27/2012   CKMB 7.3* 03/27/2012   TROPONINI <0.30 03/27/2012   Current Medications    . acetaminophen  650 mg Oral Once  . amiodarone  400 mg Oral BID  . amLODipine  10 mg Per Tube Daily  . aspirin  81 mg Oral Daily  . docusate sodium  100 mg Oral BID  . folic acid  1 mg Oral Daily  . furosemide  60 mg Intravenous Q12H  . insulin aspart  0-9 Units Subcutaneous Q4H  . lisinopril  2.5 mg Oral Daily  . metoprolol tartrate  50 mg Oral BID  . mulitivitamin with minerals  1 tablet Oral Daily  . pantoprazole  40 mg Oral Q1200  . phytonadione  5 mg Oral STAT  . sodium chloride  250 mL Intravenous Once  . sodium  chloride  500 mL Intravenous Once  . sodium chloride  3 mL Intravenous Q12H  . thiamine  100 mg Oral Daily  . warfarin  10 mg Oral ONCE-1800  . DISCONTD: metoprolol tartrate  50 mg Oral BID  . DISCONTD: phytonadione  2.5 mg Oral Once  . DISCONTD: Warfarin - Pharmacist Dosing Inpatient   Does not apply q1800    Assessment:  1.  Rectus sheath hematoma- acutely formed last night.  I greatly appreciate the help of Cardiology fellow as well as Dr Judithann Sauger.  Pt appears more comfortable now.  Blood pressure is stable, though by CT scan the rectus sheath hematoma is large. He is s/p 1 u PRBCs and heparin/ coumadin have been reversed. We will follow hematocrit closely and try to avoid PRBCs if able given volume overload with CHF.  2. AF, presently in sinus on Amiodarone, Metoprolol.  Not presently a candidate for anticoagulation.  3. Cardiomyopathy, LVEF 30-35%- likely due to heavy ETOH. Further workup deferred at this point until clinically improved.  Would avoid cath with renal failure.  Outpt myoview may be appropriate at some point in the future.  Still volume overload,continue aggressive  diuresis as renal function allows once we are sure that his blood pressure is stable with the rectus sheath hematoma.   3. VDRF, appreciate ongoing management by CCM. Outpt sleep study per Dr Craige Cotta  4. ARF, stable creatinine with lasix, though renal function is guarded Caution with ACE inhibitor  5. Hypertension. Presently hypotensive   Jarold Song 12:55 PM 04/06/2012

## 2012-04-06 NOTE — Progress Notes (Signed)
Around 0430, patients family expressed concern that patient was in severe pain and abdominal swelling appeared to be getting worse. Upon assessment, SBP found to be 70 using dopplers. Rapid response nurse notified again. MD called. New orders for transfer to CCU, NS bolus, and Vit. Kirtland Bouchard MD and surgeon came to assess patient. Report called to CCU and patient transferred. Brentlee Delage, Melida Quitter

## 2012-04-06 NOTE — Progress Notes (Signed)
Chaplain made initial visit with patient and family. Both stated that they have plenty of spiritual support from their pastors. Chaplain not needed. No follow up needed.

## 2012-04-06 NOTE — Progress Notes (Signed)
Cardiology PN  Rectus sheath hematoma has enlarged and patients blood pressure is lower.  I have asked Dr. Lindie Spruce to come by to evaluate.  I will move the patient to the ICU and give him FFP/Vit K as he has received some coumadin.  Hgb has dropped 2gm but is currently 13.5. Will type and cross PRBCs as well.

## 2012-04-06 NOTE — Consult Note (Signed)
67 y.o. Male admitted on 03/26/2012 with ischemic cardiomyopathy not previously diagnosed, also with atrial fibrillation/flutter.  Was being anti-coagulated for his atrial fibrillation, developed a watermelon sized right rectus hematoma.  He dropped his BP on 4700, likely from the large right rectus hematoma and needs to have his anti-coagulation reversed.  Will need PRBCs and FFP.  Surgery is not the first or second option here.  Angioembolization not likely also because of renal insufficiency. Will follow.  Marta Lamas. Gae Bon, MD, FACS [phone] [facility] Surgery

## 2012-04-06 NOTE — Progress Notes (Signed)
Orthopedic Tech Progress Note Patient Details:  Martin [name] 12/31/1945 [id number]  Other Ortho Devices Type of Ortho Device: Other (comment) (abdominal binder) Ortho Device Location: abdomen Ortho Device Interventions: Ordered Viewed order from doctor's order list  [name] 04/06/2012, 5:37 AM

## 2012-04-06 NOTE — Progress Notes (Addendum)
Cardiology PN  Called b/c of abnormal KUB that was ordered by PA this afternoon for abd pain.  Nonspecific bowel gas pattern.  Pt complaining of abd pain and distension.  By my exam he has assymetrical swelling on right side of his abdomen.  He states he may have noticed this yesterday but worse now.  He is eating normally and having normal BM and passing gas.  I suspect this could be a rectus sheath hematoma precipitated by heparin. - noncontrast CT abd/pelvis now - hold heparin - check cbc   Addendum: 0200am  CT scan confirms rectus sheath hematoma.  Heparin has been off now for .  Coumadin d/c'd.  INR 1.3.  Discussed with Dr. Lindie Spruce with general surgery.   - will follow hemoglobin - warm compresses - heparin/coumadin d/c'd

## 2012-04-06 NOTE — Progress Notes (Signed)
Subjective: More comfortable with binder on, SBP 80's overnight  Objective: Vital signs in last 24 hours: Temp:  [97.4 F (36.3 C)-99.9 F (37.7 C)] 98.4 F (36.9 C) (04/12 0745) Pulse Rate:  [46-94] 66  (04/12 0800) Resp:  [19-26] 23  (04/12 0800) BP: (75-114)/(33-95) 80/33 mmHg (04/12 0800) SpO2:  [90 %-100 %] 100 % (04/12 0800) Weight:  [91.536 kg (201 lb 12.8 oz)] 91.536 kg (201 lb 12.8 oz) (04/12 0507) Last BM Date: 04/05/12  Intake/Output from previous day: 04/11 0701 - 04/12 0700 In: 2220.1 [P.O.:960; I.V.:991.6; Blood:262.5; IV Piggyback:6] Out: 1350 [Urine:1350] Intake/Output this shift: Total I/O In: 329 [Blood:329] Out: -   General appearance: alert and cooperative GI: very large R abdominal wall hematoma, no generalized abdominal tenderness  Lab Results:   Basename 04/06/12 0200 04/05/12 0619  WBC 13.0* 8.4  HGB 13.5 15.9  HCT 40.1 48.7  PLT 198 176   BMET  Basename 04/04/12 0510  NA 137  K 3.9  CL 91*  CO2 35*  GLUCOSE 123*  BUN 52*  CREATININE 2.25*  CALCIUM 10.5   PT/INR  Basename 04/05/12 0619 04/04/12 0510  LABPROT 16.6* 14.1  INR 1.32 1.07   ABG No results found for this basename: PHART:2,PCO2:2,PO2:2,HCO3:2 in the last 72 hours  Studies/Results: Ct Abdomen Pelvis Wo Contrast  04/06/2012  *RADIOLOGY REPORT*  Clinical Data: Abdominal pain and distension; suspected right-sided rectus sheath hematoma.  CT ABDOMEN AND PELVIS WITHOUT CONTRAST  Technique:  Multidetector CT imaging of the abdomen and pelvis was performed following the standard protocol without intravenous contrast.  Comparison: Abdominal radiograph performed 04/05/2012  Findings: A small to moderate right-sided pleural effusion is noted, with partial collapse of the right lower lobe.  Mild left basilar atelectasis is noted.  The liver and spleen are unremarkable in appearance.  The gallbladder is within normal limits.  The pancreas and adrenal glands are unremarkable.   Nonspecific perinephric stranding noted bilaterally.  A small 1.5 cm hypodensity at the lower pole of the left kidney is thought to reflect a cyst.  No renal or ureteral stones are seen.  There is no evidence of hydronephrosis.  There is a very large focal fluid collection at the right rectus sheath, measuring approximately 17.2 x 15.1 x 7.9 cm.  This demonstrates a fluid-fluid level, compatible with an organizing hematoma.  Apparent nodularity about the periphery of collection is thought to reflect clot.  Mild overlying soft tissue inflammation is noted.  On correlation with the abdominal radiograph from 03/31/2012, this finding appears to be new.  Mass is considered to be much less likely, particularly given the fluid-fluid level.  No free fluid is identified.  The small bowel is unremarkable in appearance.  The stomach is within normal limits.  No acute vascular abnormalities are seen.  Mild scattered calcification is noted along the distal abdominal aorta and its branches.  The appendix is normal in caliber, without evidence for appendicitis.  Scattered diverticulosis is noted along the descending colon, without evidence of diverticulitis.  The colon is otherwise unremarkable in appearance.  A small to moderate left inguinal hernia is noted, containing only fat, with minimal associated soft tissue stranding.  The bladder is mildly distended and grossly unremarkable in appearance.  The prostate is enlarged, measuring 6.0 cm in transverse dimension.  No inguinal lymphadenopathy is seen.  No acute osseous abnormalities are identified.  Anterior bridging osteophytes are noted along the lower thoracic spine.  IMPRESSION:  1.  Very large focal fluid collection  at the right rectus sheath, measuring 17.2 x 15.1 x 7.9 cm. This demonstrates a fluid-fluid level, compatible with an organizing hematoma.  Apparent nodularity about the periphery of the collection is thought to reflect.  Mild overlying soft tissue inflammation  noted.  As this appears to be new from prior studies, mass is considered to be much less likely. 2.  Small to moderate right-sided pleural effusion, with partial collapse of the right lower lobe; mild left basilar atelectasis noted. 3.  Small to moderate left inguinal hernia, containing only fat, with minimal associated soft tissue stranding. 4.  Scattered diverticulosis along the descending colon, without evidence of diverticulitis. 5.  Mild scattered calcification along the distal abdominal aorta and its branches. 6.  Likely small left renal cyst noted.  These results were called by telephone on 04/06/2012  at  01:52 a.m. to  Dr. Meridee Score, who verbally acknowledged these results.  Original Report Authenticated By: Tonia Ghent, M.D.   Dg Abd 1 View  04/05/2012  *RADIOLOGY REPORT*  Clinical Data: Abdominal pain weakness.  ABDOMEN - 1 VIEW  Comparison: 03/31/2012.  Findings: Gas distended colon and small bowel centered centrally in a nonspecific distribution.  Although this may represent an ileus, obstruction not excluded.  The possibility of free intraperitoneal air cannot be addressed on a supine view.  IMPRESSION: Abnormal although nonspecific bowel gas pattern as detailed above.  Original Report Authenticated By: Fuller Canada, M.D.   Dg Chest Port 1 View  04/06/2012  *RADIOLOGY REPORT*  Clinical Data: Pneumonia, follow  PORTABLE CHEST - 1 VIEW  Comparison: Of portable chest x-ray of 04/03/2012  Findings: The lungs remain poorly aerated with opacity throughout much of the right hemithorax possibly representing effusion rolling posteriorly. The left central venous line has been removed.  Mild cardiomegaly is stable.  No bony abnormality is seen.  IMPRESSION: Poor aeration with probable right effusion.  Stable cardiomegaly.  Original Report Authenticated By: Juline Patch, M.D.    Anti-infectives: Anti-infectives     Start     Dose/Rate Route Frequency Ordered Stop   03/31/12 2000   vancomycin  (VANCOCIN) 1,500 mg in sodium chloride 0.9 % 500 mL IVPB  Status:  Discontinued        1,500 mg 250 mL/hr over 120 Minutes Intravenous Every 48 hours 03/31/12 1831 04/03/12 0916   03/28/12 1200   vancomycin (VANCOCIN) 1,500 mg in sodium chloride 0.9 % 500 mL IVPB  Status:  Discontinued        1,500 mg 250 mL/hr over 120 Minutes Intravenous Every 24 hours 03/27/12 1302 03/30/12 1313   03/27/12 1200   vancomycin (VANCOCIN) 2,000 mg in sodium chloride 0.9 % 500 mL IVPB        2,000 mg 250 mL/hr over 120 Minutes Intravenous  Once 03/27/12 1154 03/27/12 1417   03/27/12 1200   piperacillin-tazobactam (ZOSYN) IVPB 3.375 g  Status:  Discontinued        3.375 g 12.5 mL/hr over 240 Minutes Intravenous Every 8 hours 03/27/12 1154 04/03/12 0916          Assessment/Plan: s/p * No surgery found * R rectus hematoma - treatment is medical, volume resuscitate and give blood products as needed to ensure adequate clotting ability. Continue binder. We will follow.  I spoke to the patient and his family.  LOS: 11 days    Raela Bohl E 04/06/2012

## 2012-04-06 NOTE — Progress Notes (Signed)
Patient complaining of pain in abdomen early in shift. Upon assessment, right side of abdomen found to be tender to touch, swollen and tight. PA notified after acetaminophen given. Orders for KUB. Results called back to on-call MD who has seen patient and written new orders. Martin [name], Martin [name]

## 2012-04-07 LAB — PREPARE FRESH FROZEN PLASMA: Unit division: 0

## 2012-04-07 LAB — CBC
HCT: 24.8 % — ABNORMAL LOW (ref 39.0–52.0)
MCH: 29.1 pg (ref 26.0–34.0)
MCHC: 33.9 g/dL (ref 30.0–36.0)
MCV: 85.8 fL (ref 78.0–100.0)
RDW: 13.8 % (ref 11.5–15.5)

## 2012-04-07 LAB — GLUCOSE, CAPILLARY
Glucose-Capillary: 123 mg/dL — ABNORMAL HIGH (ref 70–99)
Glucose-Capillary: 129 mg/dL — ABNORMAL HIGH (ref 70–99)

## 2012-04-07 LAB — BASIC METABOLIC PANEL
BUN: 70 mg/dL — ABNORMAL HIGH (ref 6–23)
CO2: 33 mEq/L — ABNORMAL HIGH (ref 19–32)
Calcium: 8.9 mg/dL (ref 8.4–10.5)
Chloride: 88 mEq/L — ABNORMAL LOW (ref 96–112)
Chloride: 88 mEq/L — ABNORMAL LOW (ref 96–112)
Creatinine, Ser: 3.58 mg/dL — ABNORMAL HIGH (ref 0.50–1.35)
GFR calc Af Amer: 19 mL/min — ABNORMAL LOW (ref >90)
Sodium: 135 mEq/L (ref 135–145)

## 2012-04-07 LAB — HEMOGLOBIN AND HEMATOCRIT, BLOOD: HCT: 25.1 % — ABNORMAL LOW (ref 39.0–52.0)

## 2012-04-07 NOTE — Progress Notes (Signed)
Subjective: Less pain from his abdominal wall, + flatus, tolerated some PO  Objective: Vital signs in last 24 hours: Temp:  [97.4 F (36.3 C)-98.6 F (37 C)] 97.9 F (36.6 C) (04/13 0800) Pulse Rate:  [60-82] 70  (04/13 0930) Resp:  [21-35] 29  (04/13 0930) BP: (78-101)/(35-70) 100/60 mmHg (04/13 0930) SpO2:  [92 %-100 %] 100 % (04/13 0930) Weight:  [91.6 kg (201 lb 15.1 oz)] 91.6 kg (201 lb 15.1 oz) (04/13 0500) Last BM Date: 04/05/12  Intake/Output from previous day: 04/12 0701 - 04/13 0700 In: 1271 [P.O.:580; Blood:679; IV Piggyback:12] Out: 1731 [Urine:1730; Stool:1] Intake/Output this shift: Total I/O In: 300 [P.O.:300] Out: -   R rectus hematoma remains very large but feels stable in size  Lab Results:   Basename 04/07/12 0725 04/06/12 2038 04/06/12 1443  WBC 33.3* -- 18.5*  HGB 8.4* 10.7* --  HCT 24.8* 31.1* --  PLT 177 -- 173   BMET  Basename 04/07/12 0725 04/06/12 1443  NA 133* 135  K 4.1 3.4*  CL 88* 87*  CO2 32 37*  GLUCOSE 147* 147*  BUN 70* 58*  CREATININE 3.58* 2.70*  CALCIUM 8.8 9.1   PT/INR  Basename 04/06/12 1443 04/05/12 0619  LABPROT 19.9* 16.6*  INR 1.66* 1.32   ABG No results found for this basename: PHART:2,PCO2:2,PO2:2,HCO3:2 in the last 72 hours  Studies/Results: Ct Abdomen Pelvis Wo Contrast  04/06/2012  *RADIOLOGY REPORT*  Clinical Data: Abdominal pain and distension; suspected right-sided rectus sheath hematoma.  CT ABDOMEN AND PELVIS WITHOUT CONTRAST  Technique:  Multidetector CT imaging of the abdomen and pelvis was performed following the standard protocol without intravenous contrast.  Comparison: Abdominal radiograph performed 04/05/2012  Findings: A small to moderate right-sided pleural effusion is noted, with partial collapse of the right lower lobe.  Mild left basilar atelectasis is noted.  The liver and spleen are unremarkable in appearance.  The gallbladder is within normal limits.  The pancreas and adrenal glands are  unremarkable.  Nonspecific perinephric stranding noted bilaterally.  A small 1.5 cm hypodensity at the lower pole of the left kidney is thought to reflect a cyst.  No renal or ureteral stones are seen.  There is no evidence of hydronephrosis.  There is a very large focal fluid collection at the right rectus sheath, measuring approximately 17.2 x 15.1 x 7.9 cm.  This demonstrates a fluid-fluid level, compatible with an organizing hematoma.  Apparent nodularity about the periphery of collection is thought to reflect clot.  Mild overlying soft tissue inflammation is noted.  On correlation with the abdominal radiograph from 03/31/2012, this finding appears to be new.  Mass is considered to be much less likely, particularly given the fluid-fluid level.  No free fluid is identified.  The small bowel is unremarkable in appearance.  The stomach is within normal limits.  No acute vascular abnormalities are seen.  Mild scattered calcification is noted along the distal abdominal aorta and its branches.  The appendix is normal in caliber, without evidence for appendicitis.  Scattered diverticulosis is noted along the descending colon, without evidence of diverticulitis.  The colon is otherwise unremarkable in appearance.  A small to moderate left inguinal hernia is noted, containing only fat, with minimal associated soft tissue stranding.  The bladder is mildly distended and grossly unremarkable in appearance.  The prostate is enlarged, measuring 6.0 cm in transverse dimension.  No inguinal lymphadenopathy is seen.  No acute osseous abnormalities are identified.  Anterior bridging osteophytes are noted along  the lower thoracic spine.  IMPRESSION:  1.  Very large focal fluid collection at the right rectus sheath, measuring 17.2 x 15.1 x 7.9 cm. This demonstrates a fluid-fluid level, compatible with an organizing hematoma.  Apparent nodularity about the periphery of the collection is thought to reflect.  Mild overlying soft  tissue inflammation noted.  As this appears to be new from prior studies, mass is considered to be much less likely. 2.  Small to moderate right-sided pleural effusion, with partial collapse of the right lower lobe; mild left basilar atelectasis noted. 3.  Small to moderate left inguinal hernia, containing only fat, with minimal associated soft tissue stranding. 4.  Scattered diverticulosis along the descending colon, without evidence of diverticulitis. 5.  Mild scattered calcification along the distal abdominal aorta and its branches. 6.  Likely small left renal cyst noted.  These results were called by telephone on 04/06/2012  at  01:52 a.m. to  Dr. Meridee Score, who verbally acknowledged these results.  Original Report Authenticated By: Tonia Ghent, M.D.   Dg Abd 1 View  04/05/2012  *RADIOLOGY REPORT*  Clinical Data: Abdominal pain weakness.  ABDOMEN - 1 VIEW  Comparison: 03/31/2012.  Findings: Gas distended colon and small bowel centered centrally in a nonspecific distribution.  Although this may represent an ileus, obstruction not excluded.  The possibility of free intraperitoneal air cannot be addressed on a supine view.  IMPRESSION: Abnormal although nonspecific bowel gas pattern as detailed above.  Original Report Authenticated By: Fuller Canada, M.D.   Dg Chest Port 1 View  04/06/2012  *RADIOLOGY REPORT*  Clinical Data: Pneumonia, follow  PORTABLE CHEST - 1 VIEW  Comparison: Of portable chest x-ray of 04/03/2012  Findings: The lungs remain poorly aerated with opacity throughout much of the right hemithorax possibly representing effusion rolling posteriorly. The left central venous line has been removed.  Mild cardiomegaly is stable.  No bony abnormality is seen.  IMPRESSION: Poor aeration with probable right effusion.  Stable cardiomegaly.  Original Report Authenticated By: Juline Patch, M.D.    Anti-infectives: Anti-infectives     Start     Dose/Rate Route Frequency Ordered Stop    03/31/12 2000   vancomycin (VANCOCIN) 1,500 mg in sodium chloride 0.9 % 500 mL IVPB  Status:  Discontinued        1,500 mg 250 mL/hr over 120 Minutes Intravenous Every 48 hours 03/31/12 1831 04/03/12 0916   03/28/12 1200   vancomycin (VANCOCIN) 1,500 mg in sodium chloride 0.9 % 500 mL IVPB  Status:  Discontinued        1,500 mg 250 mL/hr over 120 Minutes Intravenous Every 24 hours 03/27/12 1302 03/30/12 1313   03/27/12 1200   vancomycin (VANCOCIN) 2,000 mg in sodium chloride 0.9 % 500 mL IVPB        2,000 mg 250 mL/hr over 120 Minutes Intravenous  Once 03/27/12 1154 03/27/12 1417   03/27/12 1200   piperacillin-tazobactam (ZOSYN) IVPB 3.375 g  Status:  Discontinued        3.375 g 12.5 mL/hr over 240 Minutes Intravenous Every 8 hours 03/27/12 1154 04/03/12 0916          Assessment/Plan: s/p * No surgery found * Large spontaneous R rectus sheath hematoma - Hb equilibrating lower as expected, INR corrected, no need for acute surgical intervention, continue binder  LOS: 12 days    Jakin Pavao E 04/07/2012

## 2012-04-07 NOTE — Progress Notes (Signed)
Patient ID: Martin [name], male   DOB: December 17, 1946, 66 y.o.   MRN: [id number]   66 yo AAM admitted with acute respiratory failure requiring intubation, renal failure, acute decomensated CHF, and afib.  Likely due to longstanding ETOH.  He returned to CCU 4/12 due to acute rectus sheath hematoma.  Subjective: Doing better  Taking PO passing gas  Physical Exam: Filed Vitals:   04/07/12 0400 04/07/12 0500 04/07/12 0545 04/07/12 0700  BP:   93/64 78/55  Pulse: 70  73 71  Temp: 97.7 F (36.5 C)     TempSrc: Oral     Resp: 32  34 28  Height:      Weight:  201 lb 15.1 oz (91.6 kg)    SpO2: 100%  100% 100%    GEN- The patient is chronically ill appearing, more fatigued today but stable Head- normocephalic, atraumatic Eyes-  Sclera clear, conjunctiva pink Ears- hearing intact Oropharynx- OP clear JVP 38m Lungs- few bibasilar rales, improving Heart- RRR GI- moderately distended with R sided rectus sheath hematoma Has compression binder on Extremities- no clubbing, cyanosis, 2+edema MS- no significant deformity or atrophy  Testing -   Lab Results  Component Value Date   WBC 33.3* 04/07/2012   HGB 8.4* 04/07/2012   HCT 24.8* 04/07/2012   MCV 85.8 04/07/2012   PLT 177 04/07/2012    Lab Results  Component Value Date   CREATININE 3.58* 04/07/2012   BUN 70* 04/07/2012   NA 133* 04/07/2012   K 4.1 04/07/2012   CL 88* 04/07/2012   CO2 32 04/07/2012    Lab Results  Component Value Date   CKTOTAL 96 03/27/2012   CKMB 7.3* 03/27/2012   TROPONINI <0.30 03/27/2012   Current Medications    . amiodarone  400 mg Oral BID  . amLODipine  10 mg Per Tube Daily  . docusate sodium  100 mg Oral BID  . folic acid  1 mg Oral Daily  . furosemide  60 mg Intravenous Q12H  . insulin aspart  0-9 Units Subcutaneous Q4H  . lisinopril  2.5 mg Oral Daily  . metoprolol tartrate  50 mg Oral BID  . mulitivitamin with minerals  1 tablet Oral Daily  . pantoprazole  40 mg Oral Q1200  . potassium chloride SA       . potassium chloride  20 mEq Oral Once  . sodium chloride  3 mL Intravenous Q12H  . thiamine  100 mg Oral Daily  . DISCONTD: aspirin  81 mg Oral Daily    Assessment:  1.  Rectus sheath hematoma- Pt appears more comfortable now.  Blood pressure is stable, though by CT scan the rectus sheath hematoma is large. He is s/p 1 u PRBCs and heparin/ coumadin have been reversed. We will follow hematocrit closely and try to avoid PRBCs if able given volume overload with CHF.  2. AF, presently in sinus on Amiodarone, Metoprolol.  Not presently a candidate for anticoagulation.  3. Cardiomyopathy, LVEF 30-35%- likely due to heavy ETOH. Further workup deferred at this point until clinically improved.  Would avoid cath with renal failure.  Outpt myoview may be appropriate at some point in the future.  Still volume overload,continue aggressive diuresis as renal function allows once we are sure that his blood pressure is stable with the rectus sheath hematoma.   3. VDRF, appreciate ongoing management by CCM. Outpt sleep study per Dr Craige Cotta  4. ARF, stable creatinine with lasix, though renal function is guarded Caution with ACE  inhibitor  5. Hypertension. Stable   Burna Cash 8:58 AM 04/07/2012

## 2012-04-08 ENCOUNTER — Inpatient Hospital Stay (HOSPITAL_COMMUNITY): Payer: Medicaid Other

## 2012-04-08 LAB — GLUCOSE, CAPILLARY
Glucose-Capillary: 111 mg/dL — ABNORMAL HIGH (ref 70–99)
Glucose-Capillary: 122 mg/dL — ABNORMAL HIGH (ref 70–99)
Glucose-Capillary: 153 mg/dL — ABNORMAL HIGH (ref 70–99)
Glucose-Capillary: 174 mg/dL — ABNORMAL HIGH (ref 70–99)

## 2012-04-08 LAB — CBC
MCH: 28.2 pg (ref 26.0–34.0)
MCV: 84.9 fL (ref 78.0–100.0)
Platelets: 193 10*3/uL (ref 150–400)
RBC: 2.91 MIL/uL — ABNORMAL LOW (ref 4.22–5.81)
RDW: 13.6 % (ref 11.5–15.5)

## 2012-04-08 LAB — APTT: aPTT: 29 seconds (ref 24–37)

## 2012-04-08 MED ORDER — DOPAMINE-DEXTROSE 3.2-5 MG/ML-% IV SOLN
INTRAVENOUS | Status: AC
  Filled 2012-04-08: qty 250

## 2012-04-08 MED ORDER — FUROSEMIDE 10 MG/ML IJ SOLN
20.0000 mg | Freq: Once | INTRAMUSCULAR | Status: AC
  Administered 2012-04-08: 20 mg via INTRAVENOUS
  Filled 2012-04-08: qty 2

## 2012-04-08 MED ORDER — OXYCODONE-ACETAMINOPHEN 5-325 MG PO TABS: 1.0000 | ORAL_TABLET | ORAL | Status: DC | PRN

## 2012-04-08 MED ORDER — POLYETHYLENE GLYCOL 3350 17 G PO PACK
17.0000 g | PACK | Freq: Every day | ORAL | Status: DC
  Administered 2012-04-08 – 2012-04-17 (×9): 17 g via ORAL
  Filled 2012-04-08 (×10): qty 1

## 2012-04-08 MED ORDER — DOPAMINE-DEXTROSE 3.2-5 MG/ML-% IV SOLN
5.0000 ug/kg/min | INTRAVENOUS | Status: DC
  Administered 2012-04-08 – 2012-04-09 (×2): 5 ug/kg/min via INTRAVENOUS
  Filled 2012-04-08: qty 250

## 2012-04-08 MED ORDER — DOPAMINE HCL 40 MG/ML IV SOLN: 5.0000 ug/kg/min | INTRAVENOUS | Status: DC

## 2012-04-08 NOTE — Progress Notes (Signed)
Pt with expanding of the rectus sheath hematoma by CT.  BP remains low but stable. Hct 24 after 1 unit PRBCs.  Will transfuse an additional unit PRBCs and follow closely. Low dose dopamine started for low BP with poor urine output I have spoken with Dr Dwain Sarna who is presently at the beside with me. We will continue conservative management which I believe is the appropriate option.    Fayrene Fearing Metta Koranda,MD 04/08/2012 5:12 PM

## 2012-04-08 NOTE — Progress Notes (Signed)
  Patient ID: Martin [name], male   DOB: 06/18/1946, 66 y.o.   MRN: [id number]   66 yo AAM admitted with acute respiratory failure requiring intubation, renal failure, acute decomensated CHF, and afib.  Likely due to longstanding ETOH.  He returned to CCU 4/12 due to acute rectus sheath hematoma.  Subjective: Still with lots of abdominal pain.  Pain and Distension make it difficult to take deep breath.  Physical Exam: Filed Vitals:   04/08/12 0400 04/08/12 0500 04/08/12 0600 04/08/12 0820  BP: 90/48 92/60 90/52  85/57  Pulse: 70 72 72 76  Temp:      TempSrc:      Resp: 23 18 23 27   Height:      Weight:  205 lb 11 oz (93.3 kg)    SpO2: 100% 100% 100% 100%    GEN- The patient is chronically ill appearing, more fatigued today but stable Head- normocephalic, atraumatic Eyes-  Sclera clear, conjunctiva pink Ears- hearing intact Oropharynx- OP clear JVP 62m Lungs-Basilar crackles decreased BS on right Heart- RRR GI- moderately distended with R sided rectus sheath hematoma Has compression binder on Extremities- no clubbing, cyanosis, 2+edema MS- no significant deformity or atrophy  Testing -   Lab Results  Component Value Date   WBC 33.3* 04/07/2012   HGB 8.5* 04/07/2012   HCT 25.1* 04/07/2012   MCV 85.8 04/07/2012   PLT 177 04/07/2012    Lab Results  Component Value Date   CREATININE 3.67* 04/07/2012   BUN 72* 04/07/2012   NA 135 04/07/2012   K 4.4 04/07/2012   CL 88* 04/07/2012   CO2 33* 04/07/2012    Lab Results  Component Value Date   CKTOTAL 96 03/27/2012   CKMB 7.3* 03/27/2012   TROPONINI <0.30 03/27/2012   Current Medications    . amiodarone  400 mg Oral BID  . amLODipine  10 mg Per Tube Daily  . docusate sodium  100 mg Oral BID  . folic acid  1 mg Oral Daily  . furosemide  60 mg Intravenous Q12H  . insulin aspart  0-9 Units Subcutaneous Q4H  . lisinopril  2.5 mg Oral Daily  . metoprolol tartrate  50 mg Oral BID  . mulitivitamin with minerals  1 tablet Oral Daily    . pantoprazole  40 mg Oral Q1200  . sodium chloride  3 mL Intravenous Q12H  . thiamine  100 mg Oral Daily    Assessment:  1.  Rectus sheath hematoma-  Will order repeat noncontrast CT of abdomen and pelvis today.    2. AF, presently in sinus on Amiodarone, With low BP change to low dose coreg and stop norvasc  3. Cardiomyopathy, LVEF 30-35%- likely due to heavy ETOH. Further workup deferred at this point until clinically improved.  Would avoid cath with renal failure.  Outpt myoview may be appropriate at some point in the future.  Still volume overload,continue aggressive diuresis as renal function allows once we are sure that his blood pressure is stable with the rectus sheath hematoma.  CXR today may need right thoracentesis   3. VDRF, appreciate ongoing management by CCM. Outpt sleep study per Dr Craige Cotta  4. ARF, stable creatinine with lasix, though renal function is guarded Caution with ACE inhibitor  5. Hypertension. Stable  Discussed care with son   Burna Cash 9:05 AM 04/08/2012

## 2012-04-08 NOTE — Progress Notes (Signed)
Pt's bp very soft 70-80's sbp, dopamine titrated up, on call MD notified and  Updated about status. Orders rec to hold lasix for tonoc and ok to titrate dopamine. 1 unit FFP transfused as ordered with no adverse rxn noted. Family remain @ bs updated as well. New med obtained re c/o constipation. Will monitor.   On call MD repaged re bp, will come to see pt. Will monitor.

## 2012-04-08 NOTE — Progress Notes (Signed)
Rectus sheath hematoma has expanded associated with decreased bp Exam shows obese abdomen with hard hematoma I think continued conservative mgt is appropriate and we will follow along.

## 2012-04-09 ENCOUNTER — Inpatient Hospital Stay (HOSPITAL_COMMUNITY): Payer: Medicaid Other

## 2012-04-09 DIAGNOSIS — R578 Other shock: Secondary | ICD-10-CM | POA: Diagnosis not present

## 2012-04-09 DIAGNOSIS — A419 Sepsis, unspecified organism: Secondary | ICD-10-CM

## 2012-04-09 LAB — BASIC METABOLIC PANEL
BUN: 98 mg/dL — ABNORMAL HIGH (ref 6–23)
BUN: 101 mg/dL — ABNORMAL HIGH (ref 6–23)
Chloride: 80 mEq/L — ABNORMAL LOW (ref 96–112)
Chloride: 83 mEq/L — ABNORMAL LOW (ref 96–112)
GFR calc Af Amer: 12 mL/min — ABNORMAL LOW (ref >90)
GFR calc Af Amer: 13 mL/min — ABNORMAL LOW (ref >90)
GFR calc non Af Amer: 10 mL/min — ABNORMAL LOW (ref >90)
GFR calc non Af Amer: 11 mL/min — ABNORMAL LOW (ref >90)
Potassium: 4.6 mEq/L (ref 3.5–5.1)
Potassium: 4.3 mEq/L (ref 3.5–5.1)
Sodium: 126 mEq/L — ABNORMAL LOW (ref 135–145)
Sodium: 131 mEq/L — ABNORMAL LOW (ref 135–145)

## 2012-04-09 LAB — GLUCOSE, CAPILLARY
Glucose-Capillary: 132 mg/dL — ABNORMAL HIGH (ref 70–99)
Glucose-Capillary: 134 mg/dL — ABNORMAL HIGH (ref 70–99)
Glucose-Capillary: 135 mg/dL — ABNORMAL HIGH (ref 70–99)
Glucose-Capillary: 113 mg/dL — ABNORMAL HIGH (ref 70–99)

## 2012-04-09 LAB — CBC
HCT: 22.8 % — ABNORMAL LOW (ref 39.0–52.0)
Hemoglobin: 8.2 g/dL — ABNORMAL LOW (ref 13.0–17.0)
MCH: 30.9 pg (ref 26.0–34.0)
MCHC: 36 g/dL (ref 30.0–36.0)
Platelets: 258 10*3/uL (ref 150–400)
RBC: 2.84 MIL/uL — ABNORMAL LOW (ref 4.22–5.81)
RBC: 2.65 MIL/uL — ABNORMAL LOW (ref 4.22–5.81)
RDW: 14.1 % (ref 11.5–15.5)
WBC: 33.7 10*3/uL — ABNORMAL HIGH (ref 4.0–10.5)

## 2012-04-09 LAB — PREPARE FRESH FROZEN PLASMA: Unit division: 0

## 2012-04-09 LAB — URINALYSIS, ROUTINE W REFLEX MICROSCOPIC
Glucose, UA: NEGATIVE mg/dL
Ketones, ur: NEGATIVE mg/dL
Protein, ur: NEGATIVE mg/dL
pH: 5 (ref 5.0–8.0)

## 2012-04-09 LAB — PROTIME-INR: Prothrombin Time: 19.1 seconds — ABNORMAL HIGH (ref 11.6–15.2)

## 2012-04-09 LAB — URINE MICROSCOPIC-ADD ON

## 2012-04-09 LAB — HEMOGLOBIN AND HEMATOCRIT, BLOOD: Hemoglobin: 7.5 g/dL — ABNORMAL LOW (ref 13.0–17.0)

## 2012-04-09 LAB — PREPARE RBC (CROSSMATCH)

## 2012-04-09 MED ORDER — BOOST / RESOURCE BREEZE PO LIQD
1.0000 | Freq: Two times a day (BID) | ORAL | Status: DC
  Administered 2012-04-10 – 2012-04-17 (×13): 1 via ORAL

## 2012-04-09 MED ORDER — LACTULOSE 10 GM/15ML PO SOLN
30.0000 g | Freq: Once | ORAL | Status: AC
  Administered 2012-04-09: 30 g via ORAL
  Filled 2012-04-09: qty 45

## 2012-04-09 MED ORDER — FLEET ENEMA 7-19 GM/118ML RE ENEM
1.0000 | ENEMA | Freq: Once | RECTAL | Status: DC
  Filled 2012-04-09: qty 1

## 2012-04-09 MED ORDER — PHYTONADIONE 1 MG/0.5 ML ORAL SOLUTION
1.0000 mg | Freq: Once | ORAL | Status: AC
  Filled 2012-04-09: qty 0.5

## 2012-04-09 MED ORDER — VITAMIN K1 10 MG/ML IJ SOLN
1.0000 mg | Freq: Once | INTRAMUSCULAR | Status: AC
  Administered 2012-04-09: 1 mg via SUBCUTANEOUS
  Filled 2012-04-09: qty 0.1

## 2012-04-09 MED ORDER — PHENYLEPHRINE HCL 10 MG/ML IJ SOLN
30.0000 ug/min | INTRAVENOUS | Status: DC
  Administered 2012-04-10 (×2): 30 ug/min via INTRAVENOUS
  Filled 2012-04-09 (×2): qty 1

## 2012-04-09 NOTE — Progress Notes (Signed)
Nurse called about hypotension.  Patient is currently resting and has no c/o.  Manual and doppler BP is 120 systolic.   Discussed with nurse to use manual BP.  Darden Palmer MD [facility]

## 2012-04-09 NOTE — Progress Notes (Signed)
66 yo AAM admitted with acute respiratory failure requiring intubation, renal failure, acute decomensated CHF, and afib.  Likely due to longstanding ETOH.  He returned to CCU 4/12 due to acute rectus sheath hematoma.  Subjective: Pt is resting.  He denies CP or SOB. He continues to have abdominal discomfort and distension.  His family is at the bedside.  Physical Exam: Filed Vitals:   04/09/12 0500 04/09/12 0600 04/09/12 0700 04/09/12 0747  BP: 112/64 116/66 110/54   Pulse:  77  76  Temp:    97.7 F (36.5 C)  TempSrc:    Oral  Resp:  18  22  Height:      Weight:      SpO2:  100%  100%    GEN- The patient is chronically ill appearing,  Head- normocephalic, atraumatic Eyes-  Sclera clear, conjunctiva pink Ears- hearing intact Oropharynx- OP clear JVP 8mm Lungs- few bibasilar rales  Heart- RRR, no m/r/g GI-  distended with R sided rectus sheath hematoma, hypoactive bowel sounds Extremities- no clubbing, cyanosis, 1+ dependant edema MS- no significant deformity or atrophy  Testing -   Lab Results  Component Value Date   WBC 33.7* 04/08/2012   HGB 9.1* 04/08/2012   HCT 24.6* 04/08/2012   MCV 86.6 04/08/2012   PLT 258 04/08/2012    Lab Results  Component Value Date   CREATININE 5.26* 04/09/2012   BUN 98* 04/09/2012   NA 126* 04/09/2012   K 4.6 04/09/2012   CL 80* 04/09/2012   CO2 31 04/09/2012    Lab Results  Component Value Date   CKTOTAL 96 03/27/2012   CKMB 7.3* 03/27/2012   TROPONINI <0.30 03/27/2012   Current Medications    . amiodarone  400 mg Oral BID  . docusate sodium  100 mg Oral BID  . folic acid  1 mg Oral Daily  . furosemide  20 mg Intravenous Once  . furosemide  60 mg Intravenous Q12H  . insulin aspart  0-9 Units Subcutaneous Q4H  . mulitivitamin with minerals  1 tablet Oral Daily  . pantoprazole  40 mg Oral Q1200  . phytonadione  1 mg Subcutaneous Once  . polyethylene glycol  17 g Oral Daily  . sodium chloride  3 mL Intravenous Q12H  . thiamine  100  mg Oral Daily  . DISCONTD: amLODipine  10 mg Per Tube Daily  . DISCONTD: lisinopril  2.5 mg Oral Daily  . DISCONTD: metoprolol tartrate  50 mg Oral BID    Assessment:  1.  Rectus sheath hematoma-  I spoke at length with Dr Dwain Sarna last night.  Unfortunately, surgery is does not appear to be an option. We will continue to follow CBC and support with PRBCs. His last INR was 1.5.  I will therefore give additional vitamin K. Continue dopamine for hypotension. Abdominal binder remains in place.  2.  Acute on chronic renal failure-  Decreasing urine output and rising creatinine are very worrisome. I will check UA and renal ultrasound. I have spoken with nephrology who will see him today.  Continue dopamine. We may need to stop lasix and begin IV fluid as his PO intake has reduced. He is more than 20 lbs below his admit weight, though he was severely volume overloaded when he arrived.  3. Cardiomyopathy, LVEF 30-35%- likely due to heavy ETOH. Further workup deferred at this point until clinically improved.  Would avoid cath with renal failure.  Outpt myoview may be appropriate at some point in  the future.   Would consider adding maintenance fluid and following volume closely at this point.  I will defer this decision until he is seen by nephrology.  4. VDRF, appreciate ongoing management by CCM. Outpt sleep study per Dr Craige Cotta Dr Vassie Loll to see today to assist with management of this critically ill patient.  5. Afib- maintaining sinus rhythm Off anticoagulation at this point.  Prognosis is very guarded.  Pt has multiple organ failure and multiple chronic medical conditions.  I spent more than 10 minutes in direct discussion/ conversation with Mr Rorke son and wife.  They understand that his prognosis is guarded at this point.  Tniyah Nakagawa,MD 8:29 AM 04/09/2012

## 2012-04-09 NOTE — Procedures (Signed)
Name:  Martin [name] MRN:  [id number] DOB:  May 18, 1946  PROCEDURE NOTE  Procedure:  Ultrasound-guided central venous catheter placement.  Indications:  Need for intravenous access and hemodynamic monitoring.  Consent:  Consent was taken from the patient after explaining the risks (pneumothorax and bleeding)  Anesthesia:  A total of 10 mL of 1% Lidocaine was used for local infiltration anesthesia.  Procedure summary:  Appropriate equipment was assembled.  The patient was identified as Licensed conveyancer and safety timeout was performed. The patient was placed in Trendelenburg position.  Sterile technique was used. The patient's left neck region was prepped using chlorhexidine / alcohol scrub and the field was draped in usual sterile fashion with full body drape. The left internal jugular vein and the left carotid artery were identified by ultrasound, the patency was evaluated and images were documented. The right IJ was also evaluated which was not suitable for IJ insertion because of narrowing and dilatation. After adequate anesthesia was achieved, the vein was cannulated with the introducer needle under sonographic guidance without difficulty. A guide wire was advanced through the introducer needle, which was then withdrawn. A small skin incision was made at the point of wire entry, the dilator was inserted over the guide wire and appropriate dilation was obtained. The dilator was removed and 7 French triple-lumen catheter was advanced over the guide wire, which was then removed.  All ports were aspirated and flushed with normal saline without difficulty. The catheter was secured into place with sutures at 20 cm. Antibiotic patch was placed and sterile dressing was applied. Post-procedure chest x-ray was ordered.  Complications:  No immediate complications were noted.  Hemodynamic parameters and oxygenation remained stable throughout the procedure.  Estimated blood loss:  Less then 5 mL.  [name]  MD [facility] Internal Medicine Resident Pager [phone]   04/09/2012, 11:04 AM

## 2012-04-09 NOTE — Progress Notes (Signed)
Martin [name] is a 66 y.o. male admitted on 03/26/2012 with dypsnea, leg swelling and cough.  He had SVT/a fib-flutter with elevated BP and CHF in ED.  Hospital day 2 he developed respiratory failure and hypotension requiring pressors, and PCCM consulted. Patient was stable through his stay and PCCM had signed off on 4/11. Patient has since developed a rectus sheath hematoma and was hypotensive. Patient was started on dopamine yesterday 4/14 by cardiology and PCCM was reconsulted today.  PMHx HTN, ETOH  Line/tube: ETT 4/2>>4/6, 4/6>>4/8 Lt IJ CVL 4/2>>4/10 Rt radial aline 4/2>>4/8 Left IJ 4/15>>>  Cultures: Sputum 4/2>>negative Urine 4/2>>negative Blood 4/2>>negative  Antibiotics: Vancomycin 4/2>>4/9 Zosyn 4/2>>4/9  Tests/events: 4/2 Echo>>EF 30 to 35%, mild LVH, moderate MR 4/6 Bloody secretions from ETT>>heparin held 4/9 Bedside ultrasound Rt chest>>minimal Rt pleural effusion 4/14 CT abdomen>>right rectus sheath hematoma which has expanded since prior exam on 4/12 and right pleural effusion  SUBJECTIVE: Patient is feeling fine. Denies any pain in the abdomen. He hasnt had BM since last 4 days. Had a good sleep.  OBJECTIVE:  Blood pressure 116/53, pulse 80, temperature 97.8 F (36.6 C), temperature source Oral, resp. rate 21, height 5\' 5"  (1.651 m), weight 206 lb 12.7 oz (93.8 kg), SpO2 100.00%. Wt Readings from Last 3 Encounters:  04/09/12 206 lb 12.7 oz (93.8 kg)   Body mass index is 34.41 kg/(m^2).  I/O last 3 completed shifts: In: 1564.1 [P.O.:620; I.V.:238.1; Blood:700; IV Piggyback:6] Out: 900 [Urine:900]    Physical Exam: General - no distress in bed HEENT - mm dry, no sinus tenderness Cardiac - s1s2, 2/6 SM, tachycardic Chest - resps even, non labored, few scattered rhonchi Abd - soft, mild distention, +BS but decreased Ext - decreased edema Neuro - AO X 3,  follows commands, moves all extremities  CBC    Component Value Date/Time   WBC 21.6* 04/09/2012  0837   RBC 2.65* 04/09/2012 0837   HGB 7.5* 04/09/2012 1330   HCT 21.7* 04/09/2012 1330   PLT 210 04/09/2012 0837   MCV 86.0 04/09/2012 0837   MCH 30.9 04/09/2012 0837   MCHC 36.0 04/09/2012 0837   RDW 14.2 04/09/2012 0837   LYMPHSABS 1.0 03/26/2012 1715   MONOABS 0.9 03/26/2012 1715   EOSABS 0.0 03/26/2012 1715   BASOSABS 0.0 03/26/2012 1715    BMET    Component Value Date/Time   NA 131* 04/09/2012 0837   K 4.3 04/09/2012 0837   CL 83* 04/09/2012 0837   CO2 32 04/09/2012 0837   GLUCOSE 130* 04/09/2012 0837   BUN 101* 04/09/2012 0837   CREATININE 4.98* 04/09/2012 0837   CALCIUM 9.0 04/09/2012 0837   GFRNONAA 11* 04/09/2012 0837   GFRAA 13* 04/09/2012 0837     Ct Abdomen Pelvis Wo Contrast 04/08/2012   CT ABDOMEN AND PELVIS WITHOUT CONTRAST  IMPRESSION:  1.  Interval expansion and increased density of large right rectus sheath hematoma consistent containing bleeding into the hematoma.  2.  Stable right pleural effusion.  The findings were conveyed to patient's nurse on 04/08/2012 at 1600 hours.     Dg Chest 2 View 04/08/2012  IMPRESSION: Free-flowing moderate sized right pleural effusion.     US Renal Port 04/09/2012 Clinical Data: Renal failure.  RENAL/URINARY TRACT ULTRASOUND COMPLETE  Comparison:  The CT scan of the abdomen dated 04/08/2012  Findings:  Right Kidney:  Normal. 10.0 cm in length.  Left Kidney:  10.3 cm in length.  2 cm simple appearing cyst in the lower pole.  Bladder:  The bladder is empty with a Foley catheter in place.  IMPRESSION: No significant abnormality of the kidneys.  ASSESSMENT AND PLAN  PULMONARY  A: Patient had acute respiratory failure likely from acute pulmonary edema and was extubated 4/8. Patient has minimal right pleural effusion which is stable. Patient is otherwise comfortable.  P: No interventions at this time.  CARDIOVASCULAR  A:  A fib/flutter, Acute systolic CHF EF 30-35% likely heavy ETOH use, HTN. Patient currently on dopamine for hypotension and  stable.   P: Continue dopamine per cardiology. We placed a central line for assessment of CVP and hence better manage the volume status.   RENAL  Lab 04/09/12 0837 04/09/12 0156 04/07/12 0940 04/07/12 0725 04/06/12 1443  NA 131* 126* 135 133* 135  K 4.3 4.6 -- -- --  CL 83* 80* 88* 88* 87*  CO2 32 31 33* 32 37*  BUN 101* 98* 72* 70* 58*  CREATININE 4.98* 5.26* 3.67* 3.58* 2.70*  CALCIUM 9.0 9.0 8.9 8.8 9.1  MG -- -- -- -- --  PHOS -- -- -- -- --   A:  Hyponatremia, acute on chronic renal failure - due to contrast, Ace & lasix, Patient's crt was 1.5 on admission P: -->  Renal consult today , hold ACE & lasix --> Hyponatremia improved with saline with good urine output. NO IVF at this time.  HEMATOLOGIC  Lab 04/09/12 1330 04/09/12 0837 04/08/12 2342 04/08/12 1700 04/08/12 1600 04/07/12 0940 04/07/12 0725 04/06/12 1443 04/05/12 0619 04/04/12 0510 04/03/12 0830  HGB 7.5* 8.2* 9.1* -- 8.2* 8.5* -- -- -- -- --  HCT 21.7* 22.8* 24.6* -- 24.7* 25.1* -- -- -- -- --  PLT -- 210 258 -- 193 -- 177 173 -- -- --  INR -- -- -- 1.50* -- -- -- 1.66* 1.32 1.07 1.15  APTT -- -- -- 29 -- -- -- -- -- -- --   A:  Patient has acute blood loss anemia. Hbg down to 7.5 again after 2 units yesterday. P: -->  CBC Q12 hours and tranfuse 2 units if next Hbg <7  Rectus sheath hematoma  A: Patient has abdominal binder in place. Will transfuse FFP if INR is >1.5. No surgery at this time.    BEST PRACTICE / DISPOSITION -->  Full code -->  Protonix IV for GI Px -->  Family updated at bedside    [name] MD [facility] Internal Medicine Resident Pager [phone]  Care during the described time interval was provided by me and/or other providers on the critical care team.  I have reviewed this patient's available data, including medical history, events of note, physical examination and test results as part of my evaluation  CC time x  40 m Martin Mestre V.   3:14 PM

## 2012-04-09 NOTE — Progress Notes (Signed)
Nutrition Follow-up  Diet Order:  Carb Mod medium, 2gm sodium restriction.  PO intake decreased with hematoma, 4/12. No intake reported since  4/13 lunch.  Pt developed rectus sheath hematoma, not a surgical candidate. Has received several units of PRBC's. Renal function has decreased and nephrology consulted, renal US completed toay. Per renal AKI vs CKD?  Central line placed this AM.  Pt reports decreased appetite, possibly r/t abdominal binder or hematoma. Willing to try supplements for nutrition needs.   Meds: Scheduled Meds:   . amiodarone  400 mg Oral BID  . docusate sodium  100 mg Oral BID  . folic acid  1 mg Oral Daily  . furosemide  20 mg Intravenous Once  . insulin aspart  0-9 Units Subcutaneous Q4H  . lactulose  30 g Oral Once  . mulitivitamin with minerals  1 tablet Oral Daily  . pantoprazole  40 mg Oral Q1200  . phytonadione  1 mg Subcutaneous Once  . polyethylene glycol  17 g Oral Daily  . sodium chloride  3 mL Intravenous Q12H  . sodium phosphate  1 enema Rectal Once  . thiamine  100 mg Oral Daily  . DISCONTD: furosemide  60 mg Intravenous Q12H  . DISCONTD: lisinopril  2.5 mg Oral Daily   Continuous Infusions:   . DOPamine 5 mcg/kg/min (04/09/12 0800)  . DISCONTD: DOPamine NICU IV Infusion 3200 mcg/mL =/>1.5 kg (Blue)     PRN Meds:.sodium chloride, acetaminophen, ipratropium, levalbuterol, LORazepam, ondansetron (ZOFRAN) IV, oxyCODONE-acetaminophen, sodium chloride, DISCONTD: magnesium hydroxide  Labs:  CMP     Component Value Date/Time   NA 131* 04/09/2012 0837   K 4.3 04/09/2012 0837   CL 83* 04/09/2012 0837   CO2 32 04/09/2012 0837   GLUCOSE 130* 04/09/2012 0837   BUN 101* 04/09/2012 0837   CREATININE 4.98* 04/09/2012 0837   CALCIUM 9.0 04/09/2012 0837   PROT 6.5 03/29/2012 1430   ALBUMIN 2.6* 03/29/2012 1430   AST 42* 03/29/2012 1430   ALT 101* 03/29/2012 1430   ALKPHOS 104 03/29/2012 1430   BILITOT 0.6 03/29/2012 1430   GFRNONAA 11* 04/09/2012 0837   GFRAA 13*  04/09/2012 0837     Intake/Output Summary (Last 24 hours) at 04/09/12 1046 Last data filed at 04/09/12 0800  Gross per 24 hour  Intake 1231.79 ml  Output    550 ml  Net 681.79 ml    Weight Status:  206 lbs, trending up x 5 days, remains below admission weight  Re-estimated needs:  1800-2000 kcal, 80-100 gm  Nutrition Dx:  Inadequate oral intake now r/t poor appetite AEB no intake per documentation for 2+ days  Goal:  PO intake to meet >90% of estimated needs, unmet Now Goal: PO intake of meals and supplements to provide >90% of estimated needs  Intervention:   1. RD will add Resource Breeze BID to provide 250 kcal and 9 gm protein per 8 oz container.  2. RD will continue to follow  Monitor:  PO intake, weight, labs, I/O's   Rudean Haskell Pager #:  [phone]

## 2012-04-09 NOTE — Progress Notes (Signed)
Physician-Brief Progress Note Patient Name: Martin [name] DOB: 1946-01-11 MRN: [id number]  Date of Service  04/09/2012   HPI/Events of Note  Patient was tachycardic into 130's, discussed with dr. Marchelle Gearing.  eICU Interventions  D/C dopamine, Start neo-synephrine to a goal SBP of >100   Intervention Category Intermediate Interventions: Other: Minor Interventions: Electrolytes abnormality - evaluation and management  [name] 04/09/2012, 5:36 PM

## 2012-04-09 NOTE — Progress Notes (Signed)
Blood started slowly pt tolerated well increased rate as per protocal

## 2012-04-09 NOTE — Consult Note (Signed)
Reason for Consult:ARF Referring Physician: Hillis Range, MD  Martin [name] is an 66 y.o. male.  HPI: pt is a 66 yo AAM without sig PMH who presented to [facility] with acute onset of SOB and cough who was found to have SVT/a flutter and was admitted to SDU to start IV Dilt and heparin on 03/26/12.  Pt developed VDRF and hypotension on 03/27/12.  Hospital course further complicated by self-extubation on 03/31/12 requiring re-intubation that day.  ECHO revealed EF of 30% and was started on an ACE inhibitor on 04/05/12.  We were asked to see the pt due to progressive kidney disease over the last 5 days.  The trend in creatinine is as follows:  Trend in Creatinine: Creatinine, Ser  Date/Time Value Range Status  04/09/2012  8:37 AM 4.98* 0.50-1.35 (mg/dL) Final  1/61/0960  4:54 AM 5.26* 0.50-1.35 (mg/dL) Final  0/98/1191  4:78 AM 3.67* 0.50-1.35 (mg/dL) Final  2/95/6213  0:86 AM 3.58* 0.50-1.35 (mg/dL) Final  5/78/4696  2:95 PM 2.70* 0.50-1.35 (mg/dL) Final  2/84/1324  4:01 AM 2.25* 0.50-1.35 (mg/dL) Final  0/01/7252  6:64 AM 2.35* 0.50-1.35 (mg/dL) Final  4/0/3474  2:59 AM 2.50* 0.50-1.35 (mg/dL) Final  04/30/3874  6:43 AM 2.59* 0.50-1.35 (mg/dL) Final  02/24/9517  8:41 AM 3.07* 0.50-1.35 (mg/dL) Final  05/31/629  1:60 AM 3.27* 0.50-1.35 (mg/dL) Final  1/0/9323  5:57 AM 2.81* 0.50-1.35 (mg/dL) Final  02/25/2024  4:27 AM 2.30* 0.50-1.35 (mg/dL) Final  0/05/2375  2:83 AM 1.52* 0.50-1.35 (mg/dL) Final  12/31/1759  6:07 PM 1.49* 0.50-1.35 (mg/dL) Final    No prior history of Kidney disease and developed an acute, spontaneous abdominal rectus sheath hematoma on 04/06/12 complicated by ABLA and hemorrhagic shock/hypotension while on an Ace-Inhibitor.  PMH:   Past Medical History  Diagnosis Date  . Hypertension     PSH:  History reviewed. No pertinent past surgical history.  Allergies: No Known Allergies  Medications:   Prior to Admission medications   Not on File    Discontinued Meds:   Medications  Discontinued During This Encounter  Medication Reason  . nitroGLYCERIN (NITROGLYN) 2 % ointment 1 inch   . heparin ADULT infusion 100 units/mL (25000 units/250 mL)   . furosemide (LASIX) injection 40 mg   . furosemide (LASIX) injection 40 mg   . LORazepam (ATIVAN) tablet 1 mg   . LORazepam (ATIVAN) injection 1 mg   . norepinephrine (LEVOPHED) 8,000 mcg in dextrose 5 % 250 mL infusion Duplicate  . EPINEPHrine (ADRENALIN) 0.1 MG/ML injection Returned to ADS  . heparin ADULT infusion 100 units/mL (25000 units/250 mL)   . insulin aspart (novoLOG) injection 1-4 Units Duplicate  . metoprolol tartrate (LOPRESSOR) tablet 25 mg   . metoprolol tartrate (LOPRESSOR) tablet 25 mg   . metoprolol (LOPRESSOR) tablet 50 mg Duplicate  . amLODipine (NORVASC) 1 mg/mL oral suspension 5 mg Duplicate  . vancomycin (VANCOCIN) 1,500 mg in sodium chloride 0.9 % 500 mL IVPB   . amLODipine (NORVASC) tablet 5 mg   . free water 200 mL   . heparin ADULT infusion 100 units/mL (25000 units/250 mL)   . diltiazem (CARDIZEM) 100 mg in dextrose 5 % 100 mL infusion   . norepinephrine (LEVOPHED) 8,000 mcg in dextrose 5 % 250 mL infusion   . midazolam (VERSED) injection 2-4 mg   . fentaNYL (SUBLIMAZE) injection 50-100 mcg   . feeding supplement (PROMOTE) liquid 1,000 mL   . docusate (COLACE) 50 MG/5ML liquid 100 mg   . free water 300 mL   .  thiamine (B-1) injection 100 mg   . midazolam (VERSED) 1 mg/mL in sodium chloride 0.9 % 50 mL infusion   . midazolam (VERSED) bolus via infusion 1-2 mg   . fentaNYL (SUBLIMAZE) 10 mcg/mL in sodium chloride 0.9 % 250 mL infusion   . fentaNYL (SUBLIMAZE) bolus via infusion 50-100 mcg   . DOPamine (INTROPIN) 800 mg in dextrose 5 % 250 mL infusion   . furosemide (LASIX) 250 mg in dextrose 5 % 250 mL infusion   . acetaminophen (TYLENOL) tablet 650 mg Change in therapy  . aspirin EC tablet 81 mg Change in therapy  . folic acid (FOLVITE) tablet 1 mg   . mulitivitamin with minerals tablet 1  tablet Change in therapy  . thiamine (VITAMIN B-1) tablet 100 mg   . feeding supplement (PRO-STAT SUGAR FREE 64) liquid 30 mL Discontinued by provider  . chlorhexidine (PERIDEX) 0.12 % solution 15 mL Discontinued by provider  . antiseptic oral rinse (BIOTENE) solution 15 mL Discontinued by provider  . mulitivitamin liquid 5 mL   . metoprolol tartrate (LOPRESSOR) 25 mg/10 mL oral suspension 50 mg   . pantoprazole sodium (PROTONIX) 40 mg/20 mL oral suspension 40 mg   . aspirin chewable tablet 81 mg   . folic acid (FOLVITE) tablet 1 mg   . thiamine (VITAMIN B-1) tablet 100 mg   . pantoprazole (PROTONIX) EC tablet 40 mg   . amiodarone (NEXTERONE PREMIX) 360 mg/200 mL dextrose IV infusion   . amiodarone (NEXTERONE) 1.8 mg/mL load via infusion 150 mg   . amiodarone (NEXTERONE PREMIX) 360 mg/200 mL dextrose IV infusion   . piperacillin-tazobactam (ZOSYN) IVPB 3.375 g   . vancomycin (VANCOCIN) 1,500 mg in sodium chloride 0.9 % 500 mL IVPB   . ipratropium (ATROVENT) nebulizer solution 0.5 mg   . levalbuterol (XOPENEX) nebulizer solution 0.63 mg   . heparin ADULT infusion 100 units/mL (25000 units/250 mL)   . magnesium hydroxide (MILK OF MAGNESIA) NICU oral syringe Entry Error  . Acetaminophen SUSP 500 mg Formulary change  . metoprolol (LOPRESSOR) tablet 50 mg   . Warfarin - Pharmacist Dosing Inpatient   . heparin ADULT infusion 100 units/mL (25000 units/250 mL)   . phytonadione (VITAMIN K) tablet 2.5 mg   . aspirin chewable tablet 81 mg   . amLODipine (NORVASC) tablet 10 mg   . metoprolol (LOPRESSOR) tablet 50 mg   . DOPamine NICU IV Infusion 3200 mcg/mL =/>1.5 kg (Blue)   . lisinopril (PRINIVIL,ZESTRIL) tablet 2.5 mg   . furosemide (LASIX) injection 60 mg   . magnesium hydroxide (MILK OF MAGNESIA) suspension 30 mL     Social History:  reports that he has never smoked. He does not have any smokeless tobacco history on file. He reports that he drinks alcohol. He reports that he does not use  illicit drugs.  Family History:  History reviewed. No pertinent family history.  A comprehensive review of systems was negative except for: Gastrointestinal: positive for abdominal pain and distention Weight change: 0.5 kg (1 lb 1.6 oz)  Intake/Output Summary (Last 24 hours) at 04/09/12 1126 Last data filed at 04/09/12 0800  Gross per 24 hour  Intake 1231.79 ml  Output    550 ml  Net 681.79 ml    General appearance: alert, fatigued and no distress Head: Normocephalic, without obvious abnormality, atraumatic Eyes: negative findings: lids and lashes normal, conjunctivae and sclerae normal, corneas clear and pupils equal, round, reactive to light and accomodation Neck: no adenopathy, no carotid bruit, no  JVD, supple, symmetrical, trachea midline and thyroid not enlarged, symmetric, no tenderness/mass/nodules Resp: diminished breath sounds bibasilar and R > L Cardio: tachycardic without rub GI: abnormal findings:  distended, guarding and firm mass on right side of abd, binder in place Extremities: edema trace pedal edema  Labs: Basic Metabolic Panel:  Lab 04/09/12 3086 04/09/12 0156 04/07/12 0940 04/07/12 0725 04/06/12 1443 04/04/12 0510 04/03/12 0415  NA 131* 126* 135 133* 135 137 145  K 4.3 4.6 4.4 4.1 3.4* 3.9 3.5  CL 83* 80* 88* 88* 87* 91* 99  CO2 32 31 33* 32 37* 35* 37*  GLUCOSE 130* 141* 151* 147* 147* 123* 122*  BUN 101* 98* 72* 70* 58* 52* 52*  CREATININE 4.98* 5.26* 3.67* 3.58* 2.70* 2.25* 2.35*  ALB -- -- -- -- -- -- --  CALCIUM 9.0 9.0 8.9 8.8 9.1 10.5 10.0  PHOS -- -- -- -- -- -- --   Liver Function Tests: No results found for this basename: AST:3,ALT:3,ALKPHOS:3,BILITOT:3,PROT:3,ALBUMIN:3 in the last 168 hours No results found for this basename: LIPASE:3,AMYLASE:3 in the last 168 hours No results found for this basename: AMMONIA:3 in the last 168 hours CBC:  Lab 04/09/12 0837 04/08/12 2342 04/08/12 1600 04/07/12 0940 04/07/12 0725  WBC 21.6* 33.7* 33.2* --  33.3*  NEUTROABS -- -- -- -- --  HGB 8.2* 9.1* 8.2* 8.5* --  HCT 22.8* 24.6* 24.7* 25.1* --  MCV 86.0 86.6 84.9 -- 85.8  PLT 210 258 193 -- 177   PT/INR: @labrcntip (inr:5) Cardiac Enzymes: No results found for this basename: CKTOTAL:5,CKMB:5,CKMBINDEX:5,TROPONINI:5 in the last 168 hours CBG:  Lab 04/09/12 0751 04/09/12 0344 04/08/12 2343 04/08/12 2006 04/08/12 1601  GLUCAP 134* 132* 153* 174* 122*    Iron Studies: No results found for this basename: IRON:30,TIBC:30,TRANSFERRIN:30,FERRITIN:30 in the last 168 hours  Xrays/Other Studies: Ct Abdomen Pelvis Wo Contrast  04/08/2012  **ADDENDUM** CREATED: 04/08/2012 16:21:58  Findings conveyed to the Dr. Gala Romney on 04/08/2012 at to 1620 hours.  **END ADDENDUM** SIGNED BY: Genevive Bi, M.D.    04/08/2012  *RADIOLOGY REPORT*  Clinical Data: rectus sheath hematoma  CT ABDOMEN AND PELVIS WITHOUT CONTRAST  Technique:  Multidetector CT imaging of the abdomen and pelvis was performed following the standard protocol without intravenous contrast.  Comparison: The CT 04/06/2012  Findings: There is interval expansion of the right rectus sheath hematoma which measures 17.5 x 11.6 cm in axial dimension compared to 16.2 x 8.8 cm on prior.  The measures 17.0  in craniocaudad dimension compared to 15 cm on prior.  There is new density within the hematoma consistent with interval hemorrhage.  There is a right pleural effusion with basilar atelectasis similar to prior.  No pericardial fluid.  No focal hepatic lesion.  The gallbladder, pancreas, spleen, adrenal glands, and kidneys are normal.  The stomach, small bowel, and colon are normal.  Abdominal aorta normal caliber.  No retroperitoneal periportal lymphadenopathy.  No free fluid the pelvis.  The prostate gland bladder are normal. There is a large fat filled left inguinal hernia versus lipoma of the spermatic cord.  No aggressive osseous lesions.  IMPRESSION:  1.  Interval expansion and increased density of  large right rectus sheath hematoma consistent containing bleeding into the hematoma.  2.  Stable right pleural effusion.  The findings were conveyed to patient's nurse on 04/08/2012 at 1600 hours.  She conveyed that findings were also conveyed to the PA [place] at time of scan. Original Report Authenticated By: Genevive Bi, M.D.  Dg Chest 2 View  04/08/2012  *RADIOLOGY REPORT*  Clinical Data: Fever, weakness and shortness of breath.  Right side hematoma.  Right pleural effusion.  CHEST - 2 VIEW  Comparison: Radiographs 04/06/2012 and 04/03/2012. Abdominal CT today.  Findings: A moderate sized right pleural effusion demonstrates free- flowing on the right lateral decubitus views.  There is underlying right basilar atelectasis.  No significant pleural effusion is apparent on the left.  IMPRESSION: Free-flowing moderate sized right pleural effusion.  Original Report Authenticated By: Gerrianne Scale, M.D.   US Renal Port  04/09/2012  *RADIOLOGY REPORT*  Clinical Data: Renal failure.  RENAL/URINARY TRACT ULTRASOUND COMPLETE  Comparison:  The CT scan of the abdomen dated 04/08/2012  Findings:  Right Kidney:  Normal. 10.0 cm in length.  Left Kidney:  10.3 cm in length.  2 cm simple appearing cyst in the lower pole.  Bladder:  The bladder is empty with a Foley catheter in place.  IMPRESSION: No significant abnormality of the kidneys.  Original Report Authenticated By: Gwynn Burly, M.D.     Assessment/Plan: 1.  AKI/?CKD- pt with multiple insults, initially cardiogenic shock and rapid a flutter, then further complicated by ABLA from spontaneous rectus sheath hematoma with hypotension and pressors in setting of ACE-Inhibitor.  No hydro with [place], but need to also r/o Abdominal compartment syndrome.  UOP starting to improve and slight improvement of creatinine.  No urgent indication for HD at this time.  Will consider to follow closely 2. Hemorrhagic shock/ABLA/abdominal rectus sheath hematoma-  continue with supportive care and transfuse as needed.  Surgery following.  Will need bladder pressure to r/o abdominal compartment syndrome 3. Hyponatremia- due to above.  Improving with volume replacement 4. Afib/flutter- per cardiology.  On amiodarone, consider neo rather than dopamine to help BP and not rate 5. CHF/cardiogenic shock- per cardiology   Desteny Freeman A 04/09/2012, 11:26 AM

## 2012-04-09 NOTE — Procedures (Signed)
Supervised by me & NP, Tanja Port. For CVp monitoring & frequent blood draws, PICC could nt be placed due to renal failure  Shandon Matson V.

## 2012-04-09 NOTE — Progress Notes (Signed)
eLink Physician-Brief Progress Note Patient Name: Martin [name] DOB: 09-08-1946 MRN: [id number]  Date of Service  04/09/2012   HPI/Events of Note   Lab 04/09/12 1330 04/09/12 0837 04/08/12 2342 04/08/12 1600 04/07/12 0940  HGB 7.5* 8.2* 9.1* 8.2* 8.5*   No results found for this basename: TROPONINI:5 in the last 168 hours   On neo now instead of dopa BP MAP 73. HR 72   eICU Interventions  Will check cbc again. Will transfuse if < 7 or worsening shoc   Intervention Category Major Interventions: Other:  Lilibeth Opie 04/09/2012, 10:33 PM

## 2012-04-09 NOTE — Progress Notes (Signed)
Subjective: No complaints but he feels a little tight on right side of abd  Objective: Vital signs in last 24 hours: Temp:  [97.4 F (36.3 C)-99.7 F (37.6 C)] 97.7 F (36.5 C) (04/15 0747) Pulse Rate:  [72-90] 79  (04/15 0800) Resp:  [18-31] 21  (04/15 0800) BP: (61-132)/(13-88) 107/68 mmHg (04/15 0800) SpO2:  [95 %-100 %] 100 % (04/15 0800) Weight:  [206 lb 12.7 oz (93.8 kg)] 206 lb 12.7 oz (93.8 kg) (04/15 0300) Last BM Date: 04/05/12  Intake/Output from previous day: 04/14 0701 - 04/15 0700 In: 1264.1 [P.O.:320; I.V.:238.1; Blood:700; IV Piggyback:6] Out: 650 [Urine:650] Intake/Output this shift: Total I/O In: 213.7 [P.O.:200; I.V.:13.7] Out: -   GI: tender on right. few bowel sounds  Lab Results:   [facility] 04/09/12 0837 04/08/12 2342  WBC 21.6* 33.7*  HGB 8.2* 9.1*  HCT 22.8* 24.6*  PLT 210 258   BMET  Basename 04/09/12 0837 04/09/12 0156  NA 131* 126*  K 4.3 4.6  CL 83* 80*  CO2 32 31  GLUCOSE 130* 141*  BUN 101* 98*  CREATININE 4.98* 5.26*  CALCIUM 9.0 9.0   PT/INR  Basename 04/08/12 1700 04/06/12 1443  LABPROT 18.4* 19.9*  INR 1.50* 1.66*   ABG No results found for this basename: PHART:2,PCO2:2,PO2:2,HCO3:2 in the last 72 hours  Studies/Results: Ct Abdomen Pelvis Wo Contrast  04/08/2012  **ADDENDUM** CREATED: 04/08/2012 16:21:58  Findings conveyed to the Dr. Gala Romney on 04/08/2012 at to 1620 hours.  **END ADDENDUM** SIGNED BY: Genevive Bi, M.D.    04/08/2012  *RADIOLOGY REPORT*  Clinical Data: rectus sheath hematoma  CT ABDOMEN AND PELVIS WITHOUT CONTRAST  Technique:  Multidetector CT imaging of the abdomen and pelvis was performed following the standard protocol without intravenous contrast.  Comparison: The CT 04/06/2012  Findings: There is interval expansion of the right rectus sheath hematoma which measures 17.5 x 11.6 cm in axial dimension compared to 16.2 x 8.8 cm on prior.  The measures 17.0  in craniocaudad dimension compared to 15  cm on prior.  There is new density within the hematoma consistent with interval hemorrhage.  There is a right pleural effusion with basilar atelectasis similar to prior.  No pericardial fluid.  No focal hepatic lesion.  The gallbladder, pancreas, spleen, adrenal glands, and kidneys are normal.  The stomach, small bowel, and colon are normal.  Abdominal aorta normal caliber.  No retroperitoneal periportal lymphadenopathy.  No free fluid the pelvis.  The prostate gland bladder are normal. There is a large fat filled left inguinal hernia versus lipoma of the spermatic cord.  No aggressive osseous lesions.  IMPRESSION:  1.  Interval expansion and increased density of large right rectus sheath hematoma consistent containing bleeding into the hematoma.  2.  Stable right pleural effusion.  The findings were conveyed to patient's nurse on 04/08/2012 at 1600 hours.  She conveyed that findings were also conveyed to the PA [place] at time of scan. Original Report Authenticated By: Genevive Bi, M.D.   Dg Chest 2 View  04/08/2012  *RADIOLOGY REPORT*  Clinical Data: Fever, weakness and shortness of breath.  Right side hematoma.  Right pleural effusion.  CHEST - 2 VIEW  Comparison: Radiographs 04/06/2012 and 04/03/2012. Abdominal CT today.  Findings: A moderate sized right pleural effusion demonstrates free- flowing on the right lateral decubitus views.  There is underlying right basilar atelectasis.  No significant pleural effusion is apparent on the left.  IMPRESSION: Free-flowing moderate sized right pleural effusion.  Original  Report Authenticated By: Gerrianne Scale, M.D.    Anti-infectives: Anti-infectives     Start     Dose/Rate Route Frequency Ordered Stop   03/31/12 2000   vancomycin (VANCOCIN) 1,500 mg in sodium chloride 0.9 % 500 mL IVPB  Status:  Discontinued        1,500 mg 250 mL/hr over 120 Minutes Intravenous Every 48 hours 03/31/12 1831 04/03/12 0916   03/28/12 1200   vancomycin (VANCOCIN)  1,500 mg in sodium chloride 0.9 % 500 mL IVPB  Status:  Discontinued        1,500 mg 250 mL/hr over 120 Minutes Intravenous Every 24 hours 03/27/12 1302 03/30/12 1313   03/27/12 1200   vancomycin (VANCOCIN) 2,000 mg in sodium chloride 0.9 % 500 mL IVPB        2,000 mg 250 mL/hr over 120 Minutes Intravenous  Once 03/27/12 1154 03/27/12 1417   03/27/12 1200   piperacillin-tazobactam (ZOSYN) IVPB 3.375 g  Status:  Discontinued        3.375 g 12.5 mL/hr over 240 Minutes Intravenous Every 8 hours 03/27/12 1154 04/03/12 0916          Assessment/Plan: s/p * No surgery found * Continue to try to avoid anticoagulation No indication for surgical intervention at this point  LOS: 14 days    TOTH III,Malu Pellegrini S 04/09/2012

## 2012-04-10 DIAGNOSIS — R578 Other shock: Secondary | ICD-10-CM

## 2012-04-10 LAB — CBC
HCT: 22.4 % — ABNORMAL LOW (ref 39.0–52.0)
HCT: 24.3 % — ABNORMAL LOW (ref 39.0–52.0)
Hemoglobin: 7.9 g/dL — ABNORMAL LOW (ref 13.0–17.0)
MCV: 86.8 fL (ref 78.0–100.0)
Platelets: 243 10*3/uL (ref 150–400)
Platelets: 200 10*3/uL (ref 150–400)
RBC: 2.74 MIL/uL — ABNORMAL LOW (ref 4.22–5.81)
RDW: 14.2 % (ref 11.5–15.5)
RDW: 14.2 % (ref 11.5–15.5)
WBC: 12.9 10*3/uL — ABNORMAL HIGH (ref 4.0–10.5)
WBC: 15.5 10*3/uL — ABNORMAL HIGH (ref 4.0–10.5)
WBC: 13.3 10*3/uL — ABNORMAL HIGH (ref 4.0–10.5)

## 2012-04-10 LAB — BASIC METABOLIC PANEL
BUN: 91 mg/dL — ABNORMAL HIGH (ref 6–23)
BUN: 85 mg/dL — ABNORMAL HIGH (ref 6–23)
CO2: 34 mEq/L — ABNORMAL HIGH (ref 19–32)
CO2: 35 mEq/L — ABNORMAL HIGH (ref 19–32)
Chloride: 87 mEq/L — ABNORMAL LOW (ref 96–112)
Chloride: 88 mEq/L — ABNORMAL LOW (ref 96–112)
Creatinine, Ser: 3.27 mg/dL — ABNORMAL HIGH (ref 0.50–1.35)
GFR calc Af Amer: 19 mL/min — ABNORMAL LOW (ref >90)
GFR calc Af Amer: 22 mL/min — ABNORMAL LOW (ref >90)
Potassium: 3.8 mEq/L (ref 3.5–5.1)
Potassium: 3.7 mEq/L (ref 3.5–5.1)

## 2012-04-10 LAB — GLUCOSE, CAPILLARY
Glucose-Capillary: 135 mg/dL — ABNORMAL HIGH (ref 70–99)
Glucose-Capillary: 118 mg/dL — ABNORMAL HIGH (ref 70–99)
Glucose-Capillary: 214 mg/dL — ABNORMAL HIGH (ref 70–99)
Glucose-Capillary: 119 mg/dL — ABNORMAL HIGH (ref 70–99)

## 2012-04-10 LAB — TYPE AND SCREEN
ABO/RH(D): O POS
Antibody Screen: NEGATIVE
Unit division: 0
Unit division: 0
Unit division: 0

## 2012-04-10 LAB — PREPARE FRESH FROZEN PLASMA: Unit division: 0

## 2012-04-10 MED ORDER — SODIUM CHLORIDE 0.9 % IV BOLUS (SEPSIS): 500.0000 mL | Freq: Once | INTRAVENOUS | Status: DC

## 2012-04-10 MED ORDER — SODIUM CHLORIDE 0.9 % IJ SOLN
10.0000 mL | Freq: Two times a day (BID) | INTRAMUSCULAR | Status: DC
  Administered 2012-04-10 – 2012-04-13 (×8): 10 mL via INTRAVENOUS
  Administered 2012-04-15: 3 mL via INTRAVENOUS
  Administered 2012-04-16: 10 mL via INTRAVENOUS

## 2012-04-10 NOTE — Progress Notes (Signed)
Martin [name] is a 66 y.o. male admitted on 03/26/2012 with dypsnea, leg swelling and cough.  He had SVT/a fib-flutter with elevated BP and CHF in ED.  Hospital day 2 he developed respiratory failure and hypotension requiring pressors, and PCCM consulted. Patient was stable through his stay and PCCM had signed off on 4/11. Patient has since developed a rectus sheath hematoma and was hypotensive. Patient was started on dopamine yesterday 4/14 by cardiology and PCCM was reconsulted today.  PMHx HTN, ETOH  Line/tube: ETT 4/2>>4/6, 4/6>>4/8 Lt IJ CVL 4/2>>4/10 Rt radial aline 4/2>>4/8 Left IJ 4/15>>>  Cultures: Sputum 4/2>>negative Urine 4/2>>negative Blood 4/2>>negative  Antibiotics: Vancomycin 4/2>>4/9 Zosyn 4/2>>4/9  Tests/events: 4/2 Echo>>EF 30 to 35%, mild LVH, moderate MR 4/6 Bloody secretions from ETT>>heparin held 4/9 Bedside ultrasound Rt chest>>minimal Rt pleural effusion 4/14 CT abdomen>>right rectus sheath hematoma which has expanded since prior exam on 4/12 and right pleural effusion  SUBJECTIVE: Patient feels well. Denies any pain in the abdomen. Had a good sleep. Had BM yesterday evening and feeling much better since then. Appetite has improved and there is not pain reported in the belly.   OBJECTIVE:  Blood pressure 105/72, pulse 76, temperature 97.9 F (36.6 C), temperature source Oral, resp. rate 17, height 5\' 5"  (1.651 m), weight 207 lb 3.7 oz (94 kg), SpO2 98.00%. Wt Readings from Last 3 Encounters:  04/10/12 207 lb 3.7 oz (94 kg)   Body mass index is 34.49 kg/(m^2).  I/O last 3 completed shifts: In: 2130.6 [P.O.:400; I.V.:706.1; Blood:1024.5] Out: 3120 [Urine:3120]  Vent Mode:  [-]  FiO2 (%):  [3 %] 3 % Physical Exam: General - no distress in bed HEENT - mm dry, no sinus tenderness Cardiac - s1s2, 2/6 SM, tachycardic Chest - resps even, non labored, few scattered rhonchi Abd - soft, mild distention, +BS but decreased Ext - decreased edema Neuro - AO  X 3,  follows commands, moves all extremities  CBC    Component Value Date/Time   WBC 15.5* 04/10/2012 0430   RBC 2.74* 04/10/2012 0430   HGB 8.4* 04/10/2012 0430   HCT 24.3* 04/10/2012 0430   PLT 243 04/10/2012 0430   MCV 88.7 04/10/2012 0430   MCH 30.7 04/10/2012 0430   MCHC 34.6 04/10/2012 0430   RDW 14.2 04/10/2012 0430   LYMPHSABS 1.0 03/26/2012 1715   MONOABS 0.9 03/26/2012 1715   EOSABS 0.0 03/26/2012 1715   BASOSABS 0.0 03/26/2012 1715    BMET    Component Value Date/Time   NA 133* 04/10/2012 0600   K 3.7 04/10/2012 0600   CL 88* 04/10/2012 0600   CO2 35* 04/10/2012 0600   GLUCOSE 125* 04/10/2012 0600   BUN 85* 04/10/2012 0600   CREATININE 3.27* 04/10/2012 0600   CALCIUM 8.8 04/10/2012 0600   GFRNONAA 19* 04/10/2012 0600   GFRAA 22* 04/10/2012 0600     Ct Abdomen Pelvis Wo Contrast 04/08/2012   CT ABDOMEN AND PELVIS WITHOUT CONTRAST  IMPRESSION:  1.  Interval expansion and increased density of large right rectus sheath hematoma consistent containing bleeding into the hematoma.  2.  Stable right pleural effusion.  The findings were conveyed to patient's nurse on 04/08/2012 at 1600 hours.     Dg Chest 2 View 04/08/2012  IMPRESSION: Free-flowing moderate sized right pleural effusion.     US Renal Port 04/09/2012 Clinical Data: Renal failure.  RENAL/URINARY TRACT ULTRASOUND COMPLETE  Comparison:  The CT scan of the abdomen dated 04/08/2012  Findings:  Right Kidney:  Normal.  10.0 cm in length.  Left Kidney:  10.3 cm in length.  2 cm simple appearing cyst in the lower pole.  Bladder:  The bladder is empty with a Foley catheter in place.  IMPRESSION: No significant abnormality of the kidneys.  ASSESSMENT AND PLAN  PULMONARY  A: Patient had acute respiratory failure likely from acute pulmonary edema and was extubated 4/8. Patient has minimal right pleural effusion which is stable. Patient is otherwise comfortable.  P: No interventions at this time.  CARDIOVASCULAR  A:  A fib/flutter, Acute  systolic CHF EF 30-35% likely heavy ETOH use, HTN. Patient currently on neo to support BP.  P: Continue neo for now(at per min.). Goal SBP>90.  Off anticoagulants at this time.   RENAL  Lab 04/10/12 0600 04/10/12 0115 04/09/12 0837 04/09/12 0156 04/07/12 0940  NA 133* 133* 131* 126* 135  K 3.7 3.8 -- -- --  CL 88* 87* 83* 80* 88*  CO2 35* 34* 32 31 33*  BUN 85* 91* 101* 98* 72*  CREATININE 3.27* 3.64* 4.98* 5.26* 3.67*  CALCIUM 8.8 8.7 9.0 9.0 8.9  MG -- -- -- -- --  PHOS -- -- -- -- --   A:  Hyponatremia, acute on chronic renal failure - due to contrast, Ace & lasix, Patient's crt was 1.5 on admission P: -->  Appreciate Dr. Grayland Jack input , holding ACE & lasix, cret improving. CVP is 9 this morning and we may give extra fluids to improve renal function. --> Hyponatremia improved with saline with good urine output. NO IVF at this time.  HEMATOLOGIC  Lab 04/10/12 0600 04/10/12 0430 04/10/12 0115 04/09/12 1421 04/09/12 1330 04/09/12 0837 04/08/12 2342 04/08/12 1700 04/08/12 1600 04/06/12 1443 04/05/12 0619  HGB -- 8.4* 7.9* -- 7.5* 8.2* 9.1* -- -- -- --  HCT -- 24.3* 22.4* -- 21.7* 22.8* 24.6* -- -- -- --  PLT -- 243 185 -- -- 210 258 -- 193 -- --  INR 1.35 -- -- 1.57* -- -- -- 1.50* -- 1.66* 1.32  APTT -- -- -- -- -- -- -- 29 -- -- --   A:  Patient has acute blood loss anemia. Hbg down to 8.4 today after 1 unit yesterday. P: -->  CBC Q12 hours , goal 8 & above  Rectus sheath hematoma  A: Continue to monitor. No surgery at this time. Abdominal binder in place.   Constipation.  Continue colace for now. Had BM with lactulose. OK to mobilise    BEST PRACTICE / DISPOSITION -->  Full code -->  Protonix IV for GI Px -->  Family updated at bedside    [name] MD [facility] Internal [facility] Resident Pager [phone]  Independently examined pt, evaluated data & formulated above care plan with resident [facility] V.   8:51 AM

## 2012-04-10 NOTE — Progress Notes (Signed)
66 yo AAM admitted with acute respiratory failure requiring intubation, renal failure, acute decomensated CHF, and afib.  Likely due to longstanding ETOH.  He returned to CCU 4/12 due to acute rectus sheath hematoma.  Subjective: Pt is resting.  He says he feels OK today.  He denies CP or SOB.  Abdominal discomfort is improved.  His wife is at the bedside.  Physical Exam: Filed Vitals:   04/10/12 0400 04/10/12 0500 04/10/12 0600 04/10/12 0700  BP: 96/62 107/42 116/37 105/72  Pulse: 67 64 66 76  Temp: 98.2 F (36.8 C)     TempSrc: Oral     Resp: 16 17 20 17   Height:      Weight:  207 lb 3.7 oz (94 kg)    SpO2: 100% 100% 99% 98%    GEN- The patient is chronically ill appearing,  Head- normocephalic, atraumatic Eyes-  Sclera clear, conjunctiva pink Ears- hearing intact Oropharynx- OP clear JVP 8mm Lungs- few bibasilar rales  Heart- RRR, no m/r/g GI-  distended with R sided rectus sheath hematoma, hypoactive bowel sounds Extremities- no clubbing, cyanosis, 1+ dependant edema MS- no significant deformity or atrophy  Testing -   Lab Results  Component Value Date   WBC 12.9* 04/10/2012   HGB 7.9* 04/10/2012   HCT 22.4* 04/10/2012   MCV 86.8 04/10/2012   PLT 185 04/10/2012    Lab Results  Component Value Date   CREATININE 3.27* 04/10/2012   BUN 85* 04/10/2012   NA 133* 04/10/2012   K 3.7 04/10/2012   CL 88* 04/10/2012   CO2 35* 04/10/2012    Lab Results  Component Value Date   CKTOTAL 96 03/27/2012   CKMB 7.3* 03/27/2012   TROPONINI <0.30 03/27/2012   Current Medications    . amiodarone  400 mg Oral BID  . docusate sodium  100 mg Oral BID  . feeding supplement  1 Container Oral BID BM  . folic acid  1 mg Oral Daily  . insulin aspart  0-9 Units Subcutaneous Q4H  . lactulose  30 g Oral Once  . mulitivitamin with minerals  1 tablet Oral Daily  . pantoprazole  40 mg Oral Q1200  . phytonadione  1 mg Oral Once  . phytonadione  1 mg Subcutaneous Once  . polyethylene glycol   17 g Oral Daily  . sodium chloride  3 mL Intravenous Q12H  . sodium phosphate  1 enema Rectal Once  . thiamine  100 mg Oral Daily  . DISCONTD: furosemide  60 mg Intravenous Q12H  . DISCONTD: lisinopril  2.5 mg Oral Daily    Assessment:  1.  Rectus sheath hematoma-  We will continue to follow CBC and support with PRBCs. S/p Vit K and 1 unit FFP yesterday. Continue neo gtt and wean as able Abdominal binder remains in place.  2.  Acute on chronic renal failure-  Creatinine is improving I appreciate nephrology consult.  3. Cardiomyopathy, LVEF 30-35%- likely due to heavy ETOH. Further workup deferred at this point until clinically improved.  Would avoid cath with renal failure.  Outpt myoview may be appropriate at some point in the future.   Follow CVP  4. VDRF, appreciate ongoing management by CCM. Outpt sleep study per Dr Craige Cotta Dr Vassie Loll to see today to assist with management of this critically ill patient.  5. Afib- some afib yesterday,  Continue amiodarone Off anticoagulation at this point.  Prognosis is very guarded.  Pt has multiple organ failure and multiple chronic  medical conditions.   Fayrene Fearing Antonino Nienhuis,MD 7:47 AM 04/10/2012

## 2012-04-10 NOTE — Progress Notes (Signed)
Subjective: No complaints. Still has left sided abd pain  Objective: Vital signs in last 24 hours: Temp:  [96.7 F (35.9 C)-98.7 F (37.1 C)] 97.9 F (36.6 C) (04/16 0742) Pulse Rate:  [28-105] 74  (04/16 1200) Resp:  [16-33] 25  (04/16 1200) BP: (84-121)/(37-79) 115/59 mmHg (04/16 1200) SpO2:  [96 %-100 %] 99 % (04/16 1200) FiO2 (%):  [3 %] 3 % (04/15 1621) Weight:  [207 lb 3.7 oz (94 kg)] 207 lb 3.7 oz (94 kg) (04/16 0500) Last BM Date: 04/09/12  Intake/Output from previous day: 04/15 0701 - 04/16 0700 In: 1835 [P.O.:320; I.V.:490.5; Blood:1024.5] Out: 2675 [Urine:2675] Intake/Output this shift: Total I/O In: 200 [I.V.:200] Out: 700 [Urine:700]  GI: soft on left. tender on right. good bowel sounds  Lab Results:   Basename 04/10/12 0430 04/10/12 0115  WBC 15.5* 12.9*  HGB 8.4* 7.9*  HCT 24.3* 22.4*  PLT 243 185   BMET  Basename 04/10/12 0600 04/10/12 0115  NA 133* 133*  K 3.7 3.8  CL 88* 87*  CO2 35* 34*  GLUCOSE 125* 122*  BUN 85* 91*  CREATININE 3.27* 3.64*  CALCIUM 8.8 8.7   PT/INR  Basename 04/10/12 0600 04/09/12 1421  LABPROT 16.9* 19.1*  INR 1.35 1.57*   ABG No results found for this basename: PHART:2,PCO2:2,PO2:2,HCO3:2 in the last 72 hours  Studies/Results: US Renal Port  04/09/2012  *RADIOLOGY REPORT*  Clinical Data: Renal failure.  RENAL/URINARY TRACT ULTRASOUND COMPLETE  Comparison:  The CT scan of the abdomen dated 04/08/2012  Findings:  Right Kidney:  Normal. 10.0 cm in length.  Left Kidney:  10.3 cm in length.  2 cm simple appearing cyst in the lower pole.  Bladder:  The bladder is empty with a Foley catheter in place.  IMPRESSION: No significant abnormality of the kidneys.  Original Report Authenticated By: Gwynn Burly, M.D.   Dg Chest Port 1 View  04/09/2012  *RADIOLOGY REPORT*  Clinical Data: Post left IJ placement  PORTABLE CHEST - 1 VIEW  Comparison: 04/08/2012  Findings: Status post left IJ venous catheter placement with its  tip in the upper right atrium, 1 cm below the cavoatrial junction. No pneumothorax.  Moderate layering right pleural effusion.  Stable cardiomegaly.  IMPRESSION: Status post left IJ venous catheter placement with its tip in the upper right atrium, 1 cm below the cavoatrial junction.  No pneumothorax.  Moderate layering right pleural effusion.  Original Report Authenticated By: Charline Bills, M.D.    Anti-infectives: Anti-infectives     Start     Dose/Rate Route Frequency Ordered Stop   03/31/12 2000   vancomycin (VANCOCIN) 1,500 mg in sodium chloride 0.9 % 500 mL IVPB  Status:  Discontinued        1,500 mg 250 mL/hr over 120 Minutes Intravenous Every 48 hours 03/31/12 1831 04/03/12 0916   03/28/12 1200   vancomycin (VANCOCIN) 1,500 mg in sodium chloride 0.9 % 500 mL IVPB  Status:  Discontinued        1,500 mg 250 mL/hr over 120 Minutes Intravenous Every 24 hours 03/27/12 1302 03/30/12 1313   03/27/12 1200   vancomycin (VANCOCIN) 2,000 mg in sodium chloride 0.9 % 500 mL IVPB        2,000 mg 250 mL/hr over 120 Minutes Intravenous  Once 03/27/12 1154 03/27/12 1417   03/27/12 1200   piperacillin-tazobactam (ZOSYN) IVPB 3.375 g  Status:  Discontinued        3.375 g 12.5 mL/hr over 240 Minutes Intravenous Every  8 hours 03/27/12 1154 04/03/12 0916          Assessment/Plan: s/p * No surgery found * No real surgical indication to intervene on rectus sheath hematoma Hg stable. Will sign off  LOS: 15 days    TOTH III,Quinne Pires S 04/10/2012

## 2012-04-10 NOTE — Progress Notes (Signed)
Patient ID: Martin [name], male   DOB: Sep 14, 1946, 66 y.o.   MRN: [id number] S:no new complaints O:BP 105/72  Pulse 76  Temp(Src) 97.9 F (36.6 C) (Oral)  Resp 17  Ht 5\' 5"  (1.651 m)  Wt 94 kg (207 lb 3.7 oz)  BMI 34.49 kg/m2  SpO2 98%  Intake/Output Summary (Last 24 hours) at 04/10/12 0953 Last data filed at 04/10/12 0700  Gross per 24 hour  Intake 1607.55 ml  Output   2675 ml  Net -1067.45 ml   Weight change: 0.2 kg (7.1 oz) Gen:WD WN AAM who appears weak but in NAD CVS:RRR   Resp:CTA WUJ:[id number]/NFAOZHYQ tender Ext:+edema   Lab 04/10/12 0600 04/10/12 0115 04/09/12 0837 04/09/12 0156 04/07/12 0940 04/07/12 0725 04/06/12 1443  NA 133* 133* 131* 126* 135 133* 135  K 3.7 3.8 4.3 4.6 4.4 4.1 3.4*  CL 88* 87* 83* 80* 88* 88* 87*  CO2 35* 34* 32 31 33* 32 37*  GLUCOSE 125* 122* 130* 141* 151* 147* 147*  BUN 85* 91* 101* 98* 72* 70* 58*  CREATININE 3.27* 3.64* 4.98* 5.26* 3.67* 3.58* 2.70*  ALB -- -- -- -- -- -- --  CALCIUM 8.8 8.7 9.0 9.0 8.9 8.8 9.1  PHOS -- -- -- -- -- -- --  AST -- -- -- -- -- -- --  ALT -- -- -- -- -- -- --   Liver Function Tests: No results found for this basename: AST:3,ALT:3,ALKPHOS:3,BILITOT:3,PROT:3,ALBUMIN:3 in the last 168 hours No results found for this basename: LIPASE:3,AMYLASE:3 in the last 168 hours No results found for this basename: AMMONIA:3 in the last 168 hours CBC:  Lab 04/10/12 0430 04/10/12 0115 04/09/12 1330 04/09/12 0837 04/08/12 2342 04/08/12 1600  WBC 15.5* 12.9* -- 21.6* -- --  NEUTROABS -- -- -- -- -- --  HGB 8.4* 7.9* 7.5* -- -- --  HCT 24.3* 22.4* 21.7* -- -- --  MCV 88.7 86.8 -- 86.0 86.6 84.9  PLT 243 185 -- 210 -- --   Cardiac Enzymes: No results found for this basename: CKTOTAL:5,CKMB:5,CKMBINDEX:5,TROPONINI:5 in the last 168 hours CBG:  Lab 04/10/12 0744 04/10/12 0355 04/09/12 2337 04/09/12 1929 04/09/12 1628  GLUCAP 118* 135* 119* 113* 135*    Iron Studies: No results found for this basename:  IRON,TIBC,TRANSFERRIN,FERRITIN in the last 72 hours Studies/Results: Ct Abdomen Pelvis Wo Contrast  04/08/2012  **ADDENDUM** CREATED: 04/08/2012 16:21:58  Findings conveyed to the Dr. Gala Romney on 04/08/2012 at to 1620 hours.  **END ADDENDUM** SIGNED BY: Genevive Bi, M.D.    04/08/2012  *RADIOLOGY REPORT*  Clinical Data: rectus sheath hematoma  CT ABDOMEN AND PELVIS WITHOUT CONTRAST  Technique:  Multidetector CT imaging of the abdomen and pelvis was performed following the standard protocol without intravenous contrast.  Comparison: The CT 04/06/2012  Findings: There is interval expansion of the right rectus sheath hematoma which measures 17.5 x 11.6 cm in axial dimension compared to 16.2 x 8.8 cm on prior.  The measures 17.0  in craniocaudad dimension compared to 15 cm on prior.  There is new density within the hematoma consistent with interval hemorrhage.  There is a right pleural effusion with basilar atelectasis similar to prior.  No pericardial fluid.  No focal hepatic lesion.  The gallbladder, pancreas, spleen, adrenal glands, and kidneys are normal.  The stomach, small bowel, and colon are normal.  Abdominal aorta normal caliber.  No retroperitoneal periportal lymphadenopathy.  No free fluid the pelvis.  The prostate gland bladder are normal. There is a large fat  filled left inguinal hernia versus lipoma of the spermatic cord.  No aggressive osseous lesions.  IMPRESSION:  1.  Interval expansion and increased density of large right rectus sheath hematoma consistent containing bleeding into the hematoma.  2.  Stable right pleural effusion.  The findings were conveyed to patient's nurse on 04/08/2012 at 1600 hours.  She conveyed that findings were also conveyed to the PA [place] at time of scan. Original Report Authenticated By: Genevive Bi, M.D.   Dg Chest 2 View  04/08/2012  *RADIOLOGY REPORT*  Clinical Data: Fever, weakness and shortness of breath.  Right side hematoma.  Right pleural  effusion.  CHEST - 2 VIEW  Comparison: Radiographs 04/06/2012 and 04/03/2012. Abdominal CT today.  Findings: A moderate sized right pleural effusion demonstrates free- flowing on the right lateral decubitus views.  There is underlying right basilar atelectasis.  No significant pleural effusion is apparent on the left.  IMPRESSION: Free-flowing moderate sized right pleural effusion.  Original Report Authenticated By: Gerrianne Scale, M.D.   US Renal Port  04/09/2012  *RADIOLOGY REPORT*  Clinical Data: Renal failure.  RENAL/URINARY TRACT ULTRASOUND COMPLETE  Comparison:  The CT scan of the abdomen dated 04/08/2012  Findings:  Right Kidney:  Normal. 10.0 cm in length.  Left Kidney:  10.3 cm in length.  2 cm simple appearing cyst in the lower pole.  Bladder:  The bladder is empty with a Foley catheter in place.  IMPRESSION: No significant abnormality of the kidneys.  Original Report Authenticated By: Gwynn Burly, M.D.   Dg Chest Port 1 View  04/09/2012  *RADIOLOGY REPORT*  Clinical Data: Post left IJ placement  PORTABLE CHEST - 1 VIEW  Comparison: 04/08/2012  Findings: Status post left IJ venous catheter placement with its tip in the upper right atrium, 1 cm below the cavoatrial junction. No pneumothorax.  Moderate layering right pleural effusion.  Stable cardiomegaly.  IMPRESSION: Status post left IJ venous catheter placement with its tip in the upper right atrium, 1 cm below the cavoatrial junction.  No pneumothorax.  Moderate layering right pleural effusion.  Original Report Authenticated By: Charline Bills, M.D.      . amiodarone  400 mg Oral BID  . docusate sodium  100 mg Oral BID  . feeding supplement  1 Container Oral BID BM  . folic acid  1 mg Oral Daily  . insulin aspart  0-9 Units Subcutaneous Q4H  . lactulose  30 g Oral Once  . mulitivitamin with minerals  1 tablet Oral Daily  . pantoprazole  40 mg Oral Q1200  . phytonadione  1 mg Oral Once  . phytonadione  1 mg Subcutaneous Once    . polyethylene glycol  17 g Oral Daily  . sodium chloride  500 mL Intravenous Once  . sodium chloride  3 mL Intravenous Q12H  . sodium phosphate  1 enema Rectal Once  . thiamine  100 mg Oral Daily    BMET    Component Value Date/Time   NA 133* 04/10/2012 0600   K 3.7 04/10/2012 0600   CL 88* 04/10/2012 0600   CO2 35* 04/10/2012 0600   GLUCOSE 125* 04/10/2012 0600   BUN 85* 04/10/2012 0600   CREATININE 3.27* 04/10/2012 0600   CALCIUM 8.8 04/10/2012 0600   GFRNONAA 19* 04/10/2012 0600   GFRAA 22* 04/10/2012 0600   CBC    Component Value Date/Time   WBC 15.5* 04/10/2012 0430   RBC 2.74* 04/10/2012 0430   HGB 8.4* 04/10/2012  0430   HCT 24.3* 04/10/2012 0430   PLT 243 04/10/2012 0430   MCV 88.7 04/10/2012 0430   MCH 30.7 04/10/2012 0430   MCHC 34.6 04/10/2012 0430   RDW 14.2 04/10/2012 0430   LYMPHSABS 1.0 03/26/2012 1715   MONOABS 0.9 03/26/2012 1715   EOSABS 0.0 03/26/2012 1715   BASOSABS 0.0 03/26/2012 1715     Assessment/Plan:  1. AKI/?CKD- pt with multiple insults, initially cardiogenic shock and rapid a flutter, then further complicated by ABLA from spontaneous rectus sheath hematoma with hypotension and pressors in setting of ACE-Inhibitor. No hydro with [place], but ?Abdominal compartment syndrome. UOP improving as well as creatinine. No indication for HD at this time. Will consider to follow closely 2. Hemorrhagic shock/ABLA/abdominal rectus sheath hematoma- continue with supportive care and transfuse as needed. Surgery following.  3. Hyponatremia- due to above. Improving with volume replacement 4. Afib/flutter- per cardiology. On amiodarone, consider neo rather than dopamine to help BP and not rate 5. CHF/cardiogenic shock- per cardiology  Reef Achterberg A

## 2012-04-11 LAB — BASIC METABOLIC PANEL
BUN: 58 mg/dL — ABNORMAL HIGH (ref 6–23)
GFR calc Af Amer: 34 mL/min — ABNORMAL LOW (ref >90)
GFR calc non Af Amer: 29 mL/min — ABNORMAL LOW (ref >90)
Potassium: 4.2 mEq/L (ref 3.5–5.1)
Sodium: 135 mEq/L (ref 135–145)

## 2012-04-11 LAB — PROTIME-INR
INR: 1.08 (ref 0.00–1.49)
Prothrombin Time: 14.2 seconds (ref 11.6–15.2)

## 2012-04-11 LAB — GLUCOSE, CAPILLARY
Glucose-Capillary: 121 mg/dL — ABNORMAL HIGH (ref 70–99)
Glucose-Capillary: 126 mg/dL — ABNORMAL HIGH (ref 70–99)

## 2012-04-11 LAB — CBC
MCHC: 33.7 g/dL (ref 30.0–36.0)
Platelets: 252 10*3/uL (ref 150–400)
RDW: 14.4 % (ref 11.5–15.5)

## 2012-04-11 NOTE — Evaluation (Signed)
Physical Therapy Evaluation Patient Details Name: Martin [name] MRN: [id number] DOB: 1946-08-17 Today's Date: 04/11/2012  Problem List:  Patient Active Problem List  Diagnoses  . Atrial fibrillation  . CHF (congestive heart failure)  . Acute respiratory failure  . Renal failure  . Acute on chronic systolic congestive heart failure  . Acute on chronic renal failure  . Rectus sheath hematoma  . Hemorrhagic shock    Past Medical History:  Past Medical History  Diagnosis Date  . Hypertension    Past Surgical History: History reviewed. No pertinent past surgical history.  PT Assessment/Plan/Recommendation Clinical Impression Statement: Pt admitted with SOB and tachycardia and ultimately with multiple insults, initially cardiogenic shock and rapid a flutter, VDRF, then further complicated by ABLA from spontaneous rectus sheath hematoma with hypotension. Pt has now been hospitalized 16 days and has resulting deconditioning and weakness with impaired balance. Will benefit from PT to maximize independence and safety with mobility prior to d/c home with wife's assist. PT Recommendation/Assessment: Patient will need skilled PT in the acute care venue PT Problem List: Decreased strength;Decreased activity tolerance;Decreased balance;Decreased mobility;Decreased knowledge of use of DME;Cardiopulmonary status limiting activity;Obesity Barriers to Discharge: None PT Therapy Diagnosis : Difficulty walking;Generalized weakness PT Frequency: Min 3X/week PT Treatment/Interventions: DME instruction;Gait training;Functional mobility training;Therapeutic activities;Therapeutic exercise;Balance training;Patient/family education Recommendations for Other Services: OT consult Follow Up Recommendations: Home health PT vs none depending on progress Equipment Recommended:  (TBA; pt is hopeful he will not need anything) PT Goals  Acute Rehab PT Goals PT Goal Formulation: With patient Time For Goal  Achievement: 2 weeks Pt will Roll Supine to Left Side: with modified independence PT Goal: Rolling Supine to Left Side - Progress: Goal set today Pt will go Supine/Side to Sit: with modified independence;with HOB 0 degrees PT Goal: Supine/Side to Sit - Progress: Goal set today Pt will go Sit to Supine/Side: with modified independence;with HOB 0 degrees PT Goal: Sit to Supine/Side - Progress: Goal set today Pt will go Sit to Stand: with supervision;with upper extremity assist PT Goal: Sit to Stand - Progress: Goal set today Pt will go Stand to Sit: with supervision;with upper extremity assist PT Goal: Stand to Sit - Progress: Goal set today Pt will Ambulate: 51 - 150 feet;with supervision;with least restrictive assistive device;Other (comment) (vs no device) PT Goal: Ambulate - Progress: Goal set today Pt will Perform Home Exercise Program: with supervision, verbal cues required/provided (for strengthening legs and for balance) PT Goal: Perform Home Exercise Program - Progress: Goal set today  PT Evaluation Precautions/Restrictions  Precautions Precautions: Fall Required Braces or Orthoses: Other Brace/Splint Other Brace/Splint: abd binder due to Rt rectus hematoma Restrictions Weight Bearing Restrictions: No Prior Functioning  Home Living Lives With: Spouse Available Help at Discharge: Family;Available 24 hours/day Type of Home: House Home Access: Stairs to enter [employer] of Steps: 1 Entrance Stairs-Rails: None Home Layout: One level Bathroom Shower/Tub: Engineer, manufacturing systems: Standard Home Adaptive Equipment: None Prior Function Level of Independence: Independent Able to Take Stairs?: Reciprically Driving: Yes Vocation: Retired Financial risk analyst Arousal/Alertness: Awake/alert Overall Cognitive Status: Appears within functional limits for tasks assessed (slightly slow processing) Orientation Level: Oriented X4 Sensation/Coordination     Extremity Assessment  RLE Assessment RLE Assessment: Exceptions to [facility] RLE AROM (degrees) RLE Overall AROM Comments: WFL RLE Strength RLE Overall Strength Comments: grossly 3+ to 4/5 (uses momentum to come to standing) LLE Assessment LLE Assessment: Exceptions to WFL LLE AROM (degrees) LLE Overall AROM Comments: WFL LLE Strength LLE  Overall Strength Comments: grossly 3+ to 4/5 (uses momentum to come to standing) Mobility (including Balance) Bed Mobility Bed Mobility: No (pt sitting on EOB with RN on arrival (being bathed)) Transfers Transfers: Yes Sit to Stand: 4: Min assist;From bed;With upper extremity assist;With armrests;From chair/3-in-1 Sit to Stand Details (indicate cue type and reason): x2; min assist to maintain balance as he leaned slightly too far forward as coming up; min assist also due to dizziness upon standing Stand to Sit: 4: Min assist;With armrests;With upper extremity assist;To chair/3-in-1 Stand to Sit Details: x2: minguard assist for safety with cues for technique to control descent Stand Pivot Transfers: 4: Min assist;With armrests Stand Pivot Transfer Details (indicate cue type and reason): wide base of support; reports feels dizzy Ambulation/Gait Ambulation/Gait: Yes Ambulation/Gait Assistance: 4: Min assist Ambulation/Gait Assistance Details (indicate cue type and reason): pt sat and rested while telemetry box found and applied; reported head felt better prior to initiating gait (although still slightly dizzy per pt); SaO2 98% on RA during ambulation Ambulation Distance (Feet): 65 Feet Assistive device: 1 person hand held assist Gait Pattern: Step-through pattern;Decreased stride length;Decreased trunk rotation (wide base of support) Gait velocity: significantly decr  Posture/Postural Control Posture/Postural Control: No significant limitations Balance Balance Assessed: Yes Static Sitting Balance Static Sitting - Balance Support: No upper extremity  supported;Feet supported Static Sitting - Level of Assistance: 5: Stand by assistance Static Standing Balance Static Standing - Balance Support: Bilateral upper extremity supported;During functional activity (standing for pericare during bath by RN) Static Standing - Level of Assistance: 4: Min assist Exercise    End of Session PT - End of Session Equipment Utilized During Treatment: Gait belt Activity Tolerance: Patient limited by fatigue Patient left: in chair;with call bell in reach Nurse Communication: Mobility status for transfers;Mobility status for ambulation General Behavior During Session: [facility] for tasks performed  Gregrey Bloyd 04/11/2012, 10:27 AM  Pager [phone]

## 2012-04-11 NOTE — Progress Notes (Signed)
   66 yo AAM admitted with acute respiratory failure requiring intubation, renal failure, acute decomensated CHF, and afib.  Likely due to longstanding ETOH.  He returned to CCU 4/12 due to acute rectus sheath hematoma.  Subjective: Pt is resting.  He is making progress.  He denies CP or SOB.  Abdominal discomfort is improved.   Physical Exam: Filed Vitals:   04/11/12 0500 04/11/12 0600 04/11/12 0735 04/11/12 0800  BP: 122/70 114/79 131/48   Pulse:  72 74 78  Temp:    98.5 F (36.9 C)  TempSrc:    Oral  Resp:  17 21 23   Height:      Weight:      SpO2:  100% 100% 97%    GEN- The patient is chronically ill appearing,  Head- normocephalic, atraumatic Eyes-  Sclera clear, conjunctiva pink Ears- hearing intact Oropharynx- OP clear JVP 8mm Lungs- few bibasilar rales  Heart- RRR, no m/r/g GI-  distended with R sided rectus sheath hematoma, hypoactive bowel sounds Extremities- no clubbing, cyanosis, 1+ dependant edema MS- no significant deformity or atrophy  Testing -   Lab Results  Component Value Date   WBC 10.9* 04/11/2012   HGB 8.6* 04/11/2012   HCT 25.5* 04/11/2012   MCV 93.4 04/11/2012   PLT 252 04/11/2012    Lab Results  Component Value Date   CREATININE 2.26* 04/11/2012   BUN 58* 04/11/2012   NA 135 04/11/2012   K 4.2 04/11/2012   CL 93* 04/11/2012   CO2 35* 04/11/2012    Lab Results  Component Value Date   CKTOTAL 96 03/27/2012   CKMB 7.3* 03/27/2012   TROPONINI <0.30 03/27/2012   Current Medications    . amiodarone  400 mg Oral BID  . docusate sodium  100 mg Oral BID  . feeding supplement  1 Container Oral BID BM  . folic acid  1 mg Oral Daily  . insulin aspart  0-9 Units Subcutaneous Q4H  . mulitivitamin with minerals  1 tablet Oral Daily  . pantoprazole  40 mg Oral Q1200  . polyethylene glycol  17 g Oral Daily  . sodium chloride  500 mL Intravenous Once  . sodium chloride  10 mL Intravenous Q12H  . sodium chloride  3 mL Intravenous Q12H  . sodium phosphate   1 enema Rectal Once  . thiamine  100 mg Oral Daily    Assessment:  1.  Rectus sheath hematoma-  We will continue to follow CBC and support with PRBCs. Abdominal binder remains in place. Appears more stable today  2.  Acute on chronic renal failure-  Creatinine is improving I appreciate nephrology consult.  3. Cardiomyopathy, LVEF 30-35%- likely due to heavy ETOH. Further workup deferred at this point until clinically improved.  Would avoid cath with renal failure.  Outpt myoview may be appropriate at some point in the future.   Follow CVP, would keep Is and Os about even at this point.  4. VDRF, appreciate ongoing management by CCM. Outpt sleep study per Dr Craige Cotta Dr Vassie Loll to see today to assist with management of this critically ill patient.  5. Afib- some afib yesterday,  Continue amiodarone, will start to wean amiodarone soon Off anticoagulation at this point.  Prognosis is slightly improved.  Pt has multiple organ failure and multiple chronic medical conditions.   Fayrene Fearing Marvella Jenning,MD 8:54 AM 04/11/2012

## 2012-04-11 NOTE — Progress Notes (Signed)
Patient ID: Martin [name], male   DOB: 07-Jun-1946, 66 y.o.   MRN: [id number] S:feels good O:BP 103/67  Pulse 80  Temp(Src) 98.5 F (36.9 C) (Oral)  Resp 21  Ht 5\' 5"  (1.651 m)  Wt 93.6 kg (206 lb 5.6 oz)  BMI 34.34 kg/m2  SpO2 98%  Intake/Output Summary (Last 24 hours) at 04/11/12 0935 Last data filed at 04/11/12 0900  Gross per 24 hour  Intake    968 ml  Output   3200 ml  Net  -2232 ml   Weight change: -0.4 kg (-14.1 oz) Gen:WD WN AAM sitting in chair in NAD CVS:no rub Resp:CTA NWG:[id number], binder in place Ext:no edema   Lab 04/11/12 0410 04/10/12 0600 04/10/12 0115 04/09/12 0837 04/09/12 0156 04/07/12 0940 04/07/12 0725  NA 135 133* 133* 131* 126* 135 133*  K 4.2 3.7 3.8 4.3 4.6 4.4 4.1  CL 93* 88* 87* 83* 80* 88* 88*  CO2 35* 35* 34* 32 31 33* 32  GLUCOSE 128* 125* 122* 130* 141* 151* 147*  BUN 58* 85* 91* 101* 98* 72* 70*  CREATININE 2.26* 3.27* 3.64* 4.98* 5.26* 3.67* 3.58*  ALB -- -- -- -- -- -- --  CALCIUM 8.7 8.8 8.7 9.0 9.0 8.9 8.8  PHOS -- -- -- -- -- -- --  AST -- -- -- -- -- -- --  ALT -- -- -- -- -- -- --   Liver Function Tests: No results found for this basename: AST:3,ALT:3,ALKPHOS:3,BILITOT:3,PROT:3,ALBUMIN:3 in the last 168 hours No results found for this basename: LIPASE:3,AMYLASE:3 in the last 168 hours No results found for this basename: AMMONIA:3 in the last 168 hours CBC:  Lab 04/11/12 0410 04/10/12 1700 04/10/12 0430 04/10/12 0115 04/09/12 0837  WBC 10.9* 13.3* 15.5* -- --  NEUTROABS -- -- -- -- --  HGB 8.6* 8.7* 8.4* -- --  HCT 25.5* 25.6* 24.3* -- --  MCV 93.4 88.6 88.7 86.8 86.0  PLT 252 200 243 -- --   Cardiac Enzymes: No results found for this basename: CKTOTAL:5,CKMB:5,CKMBINDEX:5,TROPONINI:5 in the last 168 hours CBG:  Lab 04/11/12 0746 04/10/12 2110 04/10/12 1646 04/10/12 1257 04/10/12 0744  GLUCAP 126* 119* 121* 214* 118*    Iron Studies: No results found for this basename: IRON,TIBC,TRANSFERRIN,FERRITIN in the last 72  hours Studies/Results: US Renal Port  04/09/2012  *RADIOLOGY REPORT*  Clinical Data: Renal failure.  RENAL/URINARY TRACT ULTRASOUND COMPLETE  Comparison:  The CT scan of the abdomen dated 04/08/2012  Findings:  Right Kidney:  Normal. 10.0 cm in length.  Left Kidney:  10.3 cm in length.  2 cm simple appearing cyst in the lower pole.  Bladder:  The bladder is empty with a Foley catheter in place.  IMPRESSION: No significant abnormality of the kidneys.  Original Report Authenticated By: Gwynn Burly, M.D.   Dg Chest Port 1 View  04/09/2012  *RADIOLOGY REPORT*  Clinical Data: Post left IJ placement  PORTABLE CHEST - 1 VIEW  Comparison: 04/08/2012  Findings: Status post left IJ venous catheter placement with its tip in the upper right atrium, 1 cm below the cavoatrial junction. No pneumothorax.  Moderate layering right pleural effusion.  Stable cardiomegaly.  IMPRESSION: Status post left IJ venous catheter placement with its tip in the upper right atrium, 1 cm below the cavoatrial junction.  No pneumothorax.  Moderate layering right pleural effusion.  Original Report Authenticated By: Charline Bills, M.D.      . amiodarone  400 mg Oral BID  . docusate sodium  100  mg Oral BID  . feeding supplement  1 Container Oral BID BM  . folic acid  1 mg Oral Daily  . insulin aspart  0-9 Units Subcutaneous Q4H  . mulitivitamin with minerals  1 tablet Oral Daily  . pantoprazole  40 mg Oral Q1200  . polyethylene glycol  17 g Oral Daily  . sodium chloride  500 mL Intravenous Once  . sodium chloride  10 mL Intravenous Q12H  . sodium chloride  3 mL Intravenous Q12H  . sodium phosphate  1 enema Rectal Once  . thiamine  100 mg Oral Daily    BMET    Component Value Date/Time   NA 135 04/11/2012 0410   K 4.2 04/11/2012 0410   CL 93* 04/11/2012 0410   CO2 35* 04/11/2012 0410   GLUCOSE 128* 04/11/2012 0410   BUN 58* 04/11/2012 0410   CREATININE 2.26* 04/11/2012 0410   CALCIUM 8.7 04/11/2012 0410   GFRNONAA 29*  04/11/2012 0410   GFRAA 34* 04/11/2012 0410   CBC    Component Value Date/Time   WBC 10.9* 04/11/2012 0410   RBC 2.73* 04/11/2012 0410   HGB 8.6* 04/11/2012 0410   HCT 25.5* 04/11/2012 0410   PLT 252 04/11/2012 0410   MCV 93.4 04/11/2012 0410   MCH 31.5 04/11/2012 0410   MCHC 33.7 04/11/2012 0410   RDW 14.4 04/11/2012 0410   LYMPHSABS 1.0 03/26/2012 1715   MONOABS 0.9 03/26/2012 1715   EOSABS 0.0 03/26/2012 1715   BASOSABS 0.0 03/26/2012 1715     Assessment/Plan:  1. AKI/?CKD- pt with multiple insults, initially cardiogenic shock and rapid a flutter, then further complicated by ABLA from spontaneous rectus sheath hematoma with hypotension and pressors in setting of ACE-Inhibitor. No hydro with [place],  UOP improving as well as creatinine. Nothing further to add at this time and will sign off.  Would not resume ace until completely recovered.  No need to follow up with [place] unless renal function worsens.  F/u with PCP 2. Hemorrhagic shock/ABLA/abdominal rectus sheath hematoma- continue with supportive care and transfuse as needed. Surgery following.  3. Hyponatremia- due to above. Improving with volume replacement 4. Afib/flutter- per cardiology. On amiodarone,  5. CHF/cardiogenic shock- per cardiology  Hoang Reich A

## 2012-04-11 NOTE — Progress Notes (Addendum)
Martin [name] is a 66 y.o. male admitted on 03/26/2012 with dypsnea, leg swelling and cough.  He had SVT/a fib-flutter with elevated BP and CHF in ED.  Hospital day 2 he developed respiratory failure and hypotension requiring pressors, and PCCM consulted. Patient was stable through his stay and PCCM had signed off on 4/11. Patient has since developed a rectus sheath hematoma and was hypotensive. Patient was started on dopamine yesterday 4/14 by cardiology and PCCM was reconsulted today.  PMHx HTN, ETOH  Line/tube: ETT 4/2>>4/6, 4/6>>4/8 Lt IJ CVL 4/2>>4/10 Rt radial aline 4/2>>4/8 Left IJ 4/15>>>  Cultures: Sputum 4/2>>negative Urine 4/2>>negative Blood 4/2>>negative  Antibiotics: Vancomycin 4/2>>4/9 Zosyn 4/2>>4/9  Tests/events: 4/2 Echo>>EF 30 to 35%, mild LVH, moderate MR 4/6 Bloody secretions from ETT>>heparin held 4/9 Bedside ultrasound Rt chest>>minimal Rt pleural effusion 4/14 CT abdomen>>right rectus sheath hematoma which has expanded since prior exam on 4/12 and right pleural effusion  SUBJECTIVE: Patient feels well. Denies any pain in the abdomen. Off pressors. Had BM yesterday evening   OBJECTIVE:  Blood pressure 103/67, pulse 80, temperature 98.5 F (36.9 C), temperature source Oral, resp. rate 21, height 5\' 5"  (1.651 m), weight 206 lb 5.6 oz (93.6 kg), SpO2 98.00%. Wt Readings from Last 3 Encounters:  04/11/12 206 lb 5.6 oz (93.6 kg)   Body mass index is 34.34 kg/(m^2).  I/O last 3 completed shifts: In: 1546.8 [P.O.:580; I.V.:614.8; Blood:352] Out: 4200 [Urine:4200]    Physical Exam: General - no distress in bed HEENT - mm dry, no sinus tenderness Cardiac - s1s2, 2/6 SM, tachycardic Chest - resps even, non labored, decreased BL Abd - soft, mild distention, +BS but decreased Ext - decreased edema Neuro - AO X 3,  follows commands, moves all extremities  CBC    Component Value Date/Time   WBC 10.9* 04/11/2012 0410   RBC 2.73* 04/11/2012 0410   HGB 8.6*  04/11/2012 0410   HCT 25.5* 04/11/2012 0410   PLT 252 04/11/2012 0410   MCV 93.4 04/11/2012 0410   MCH 31.5 04/11/2012 0410   MCHC 33.7 04/11/2012 0410   RDW 14.4 04/11/2012 0410   LYMPHSABS 1.0 03/26/2012 1715   MONOABS 0.9 03/26/2012 1715   EOSABS 0.0 03/26/2012 1715   BASOSABS 0.0 03/26/2012 1715    BMET    Component Value Date/Time   NA 135 04/11/2012 0410   K 4.2 04/11/2012 0410   CL 93* 04/11/2012 0410   CO2 35* 04/11/2012 0410   GLUCOSE 128* 04/11/2012 0410   BUN 58* 04/11/2012 0410   CREATININE 2.26* 04/11/2012 0410   CALCIUM 8.7 04/11/2012 0410   GFRNONAA 29* 04/11/2012 0410   GFRAA 34* 04/11/2012 0410     Ct Abdomen Pelvis Wo Contrast 04/08/2012   CT ABDOMEN AND PELVIS WITHOUT CONTRAST  IMPRESSION:  1.  Interval expansion and increased density of large right rectus sheath hematoma consistent containing bleeding into the hematoma.  2.  Stable right pleural effusion.  The findings were conveyed to patient's nurse on 04/08/2012 at 1600 hours.     Dg Chest 2 View 04/08/2012  IMPRESSION: Free-flowing moderate sized right pleural effusion.     US Renal Port 04/09/2012 Clinical Data: Renal failure.  RENAL/URINARY TRACT ULTRASOUND COMPLETE  Comparison:  The CT scan of the abdomen dated 04/08/2012  Findings:  Right Kidney:  Normal. 10.0 cm in length.  Left Kidney:  10.3 cm in length.  2 cm simple appearing cyst in the lower pole.  Bladder:  The bladder is empty with a  Foley catheter in place.  IMPRESSION: No significant abnormality of the kidneys.  ASSESSMENT AND PLAN  PULMONARY  A: Patient had acute respiratory failure likely from acute pulmonary edema and was extubated 4/8. Patient has minimal right pleural effusion which is stable.   P: No interventions at this time.  CARDIOVASCULAR  A:  A fib/flutter, Acute systolic CHF EF 30-35% likely heavy ETOH use, HTN.  P: Off neo .   Off anticoagulants at this time.   RENAL  Lab 04/11/12 0410 04/10/12 0600 04/10/12 0115 04/09/12 0837 04/09/12  0156  NA 135 133* 133* 131* 126*  K 4.2 3.7 -- -- --  CL 93* 88* 87* 83* 80*  CO2 35* 35* 34* 32 31  BUN 58* 85* 91* 101* 98*  CREATININE 2.26* 3.27* 3.64* 4.98* 5.26*  CALCIUM 8.7 8.8 8.7 9.0 9.0  MG -- -- -- -- --  PHOS -- -- -- -- --   A:  Hyponatremia, acute on chronic renal failure - due to contrast, Ace & lasix, Patient's crt was 1.5 on admission P: -->  Appreciate Dr. Grayland Jack input , holding ACE & lasix, creat improving.  --> Hyponatremia improved with saline with good urine output.   HEMATOLOGIC  Lab 04/11/12 0410 04/10/12 1700 04/10/12 0600 04/10/12 0430 04/10/12 0115 04/09/12 1421 04/09/12 1330 04/09/12 0837 04/08/12 1700 04/06/12 1443  HGB 8.6* 8.7* -- 8.4* 7.9* -- 7.5* -- -- --  HCT 25.5* 25.6* -- 24.3* 22.4* -- 21.7* -- -- --  PLT 252 200 -- 243 185 -- -- 210 -- --  INR 1.08 -- 1.35 -- -- 1.57* -- -- 1.50* 1.66*  APTT -- -- -- -- -- -- -- -- 29 --   A:  Patient has acute blood loss anemia. Hbg down to 8.4 today after 1 unit yesterday. P: -->  CBC daily  , goal 8 & above  Rectus sheath hematoma  A: Continue to monitor. No surgery at this time. Abdominal binder in place.   Constipation.  Continue colace for now. Had BM with lactulose. OK to mobilise    BEST PRACTICE / DISPOSITION -->  Full code -->  Protonix IV for GI Px -->OK to transfer to tele, PCCM to sign off    Chrissie Noa MD. Tonny Bollman. [facility] Pager [phone] If no response call [phone]   9:37 AM

## 2012-04-12 LAB — BASIC METABOLIC PANEL
Calcium: 8.7 mg/dL (ref 8.4–10.5)
Creatinine, Ser: 1.66 mg/dL — ABNORMAL HIGH (ref 0.50–1.35)
GFR calc Af Amer: 49 mL/min — ABNORMAL LOW (ref >90)

## 2012-04-12 LAB — CBC
MCH: 30 pg (ref 26.0–34.0)
MCHC: 32.8 g/dL (ref 30.0–36.0)
MCV: 91.2 fL (ref 78.0–100.0)
Platelets: 297 10*3/uL (ref 150–400)
RDW: 14.4 % (ref 11.5–15.5)

## 2012-04-12 MED ORDER — AMIODARONE HCL 200 MG PO TABS
200.0000 mg | ORAL_TABLET | Freq: Two times a day (BID) | ORAL | Status: DC
  Administered 2012-04-12 – 2012-04-16 (×9): 200 mg via ORAL
  Filled 2012-04-12 (×11): qty 1

## 2012-04-12 NOTE — Progress Notes (Signed)
Patient Name: Martin [name] Date of Encounter: 04/12/2012  Principal Problem:  *CHF (congestive heart failure) Active Problems:  Atrial fibrillation  Acute respiratory failure  Renal failure  Acute on chronic systolic congestive heart failure  Acute on chronic renal failure  Rectus sheath hematoma  Hemorrhagic shock    SUBJECTIVE: Pt up to chair without problems and walked some yesterday. Denies chest pain, SOB is improving. Catheter is leaking and painful. Doesn't like the food. Not what he's used to.   OBJECTIVE  Filed Vitals:   04/11/12 2000 04/12/12 0000 04/12/12 0400 04/12/12 0430  BP: 144/71 148/68 133/66   Pulse: 81 85 84   Temp: 99.1 F (37.3 C) 99.1 F (37.3 C) 98.9 F (37.2 C)   TempSrc: Oral Oral Oral   Resp: 25 25 25    Height:      Weight:    203 lb 14.8 oz (92.5 kg)  SpO2: 96% 93% 95%     Intake/Output Summary (Last 24 hours) at 04/12/12 0714 Last data filed at 04/12/12 0300  Gross per 24 hour  Intake   1200 ml  Output   1500 ml  Net   -300 ml   Weight change: -2 lb 6.8 oz (-1.1 kg) Wt Readings from Last 3 Encounters:  04/12/12 203 lb 14.8 oz (92.5 kg)   Filed Weights   04/10/12 0500 04/11/12 0445 04/12/12 0430  Weight: 207 lb 3.7 oz (94 kg) 206 lb 5.6 oz (93.6 kg) 203 lb 14.8 oz (92.5 kg)     PHYSICAL EXAM  General: Well developed, well nourished, male in no acute distress. Head: Normocephalic, atraumatic.  Neck: Supple without bruits, JVD minimally elevated. Lungs:  Resp regular and unlabored, rales bases, upper airway wheeze. Heart: RRR, S1, S2, no S3, S4, 2/6 murmur. Abdomen: Soft, non-tender, slightly distended, BS present. Abdominal binder in place  Extremities: No clubbing, cyanosis, 1-2+ edema.  Neuro: Alert and oriented X 3. Moves all extremities spontaneously. Psych: Normal affect.  LABS:  CBC: Basename 04/12/12 0515 04/11/12 0410  WBC 12.4* 10.9*  NEUTROABS -- --  HGB 8.9* 8.6*  HCT 27.1* 25.5*  MCV 91.2 93.4  PLT  297 252   INR: Basename 04/12/12 0515  INR 1.09   Basic Metabolic Panel: Basename 04/12/12 0515 04/11/12 0410  NA 134* 135  K 3.6 4.2  CL 93* 93*  CO2 33* 35*  GLUCOSE 132* 128*  BUN 35* 58*  CREATININE 1.66* 2.26*  CALCIUM 8.7 8.7  MG -- --  PHOS -- --   BNP: Pro B Natriuretic peptide (BNP)  Date/Time Value Range Status  03/28/2012  4:15 AM 1803.0* 0-125 (pg/mL) Final  03/27/2012  4:10 AM 3740.0* 0-125 (pg/mL) Final   TELE:   SR, rare PVCs  Radiology/Studies: Ct Abdomen Pelvis Wo Contrast 04/08/2012  *RADIOLOGY REPORT*  Clinical Data: rectus sheath hematoma  CT ABDOMEN AND PELVIS WITHOUT CONTRAST  Technique:  Multidetector CT imaging of the abdomen and pelvis was performed following the standard protocol without intravenous contrast.  Comparison: The CT 04/06/2012  Findings: There is interval expansion of the right rectus sheath hematoma which measures 17.5 x 11.6 cm in axial dimension compared to 16.2 x 8.8 cm on prior.  The measures 17.0  in craniocaudad dimension compared to 15 cm on prior.  There is new density within the hematoma consistent with interval hemorrhage.  There is a right pleural effusion with basilar atelectasis similar to prior.  No pericardial fluid.  No focal hepatic lesion.  The gallbladder, pancreas,  spleen, adrenal glands, and kidneys are normal.  The stomach, small bowel, and colon are normal.  Abdominal aorta normal caliber.  No retroperitoneal periportal lymphadenopathy.  No free fluid the pelvis.  The prostate gland bladder are normal. There is a large fat filled left inguinal hernia versus lipoma of the spermatic cord.  No aggressive osseous lesions.  IMPRESSION:  1.  Interval expansion and increased density of large right rectus sheath hematoma consistent containing bleeding into the hematoma.  2.  Stable right pleural effusion.  The findings were conveyed to patient's nurse on 04/08/2012 at 1600 hours.  She conveyed that findings were also conveyed to the PA  [place] at time of scan. Original Report Authenticated By: Genevive Bi, M.D.   Dg Chest 2 View 04/08/2012  *RADIOLOGY REPORT*  Clinical Data: Fever, weakness and shortness of breath.  Right side hematoma.  Right pleural effusion.  CHEST - 2 VIEW  Comparison: Radiographs 04/06/2012 and 04/03/2012. Abdominal CT today.  Findings: A moderate sized right pleural effusion demonstrates free- flowing on the right lateral decubitus views.  There is underlying right basilar atelectasis.  No significant pleural effusion is apparent on the left.  IMPRESSION: Free-flowing moderate sized right pleural effusion.  Original Report Authenticated By: Gerrianne Scale, M.D.   US Renal Port 04/09/2012  *RADIOLOGY REPORT*  Clinical Data: Renal failure.  RENAL/URINARY TRACT ULTRASOUND COMPLETE  Comparison:  The CT scan of the abdomen dated 04/08/2012  Findings:  Right Kidney:  Normal. 10.0 cm in length.  Left Kidney:  10.3 cm in length.  2 cm simple appearing cyst in the lower pole.  Bladder:  The bladder is empty with a Foley catheter in place.  IMPRESSION: No significant abnormality of the kidneys.  Original Report Authenticated By: Gwynn Burly, M.D.   Dg Chest Port 1 View 04/09/2012  *RADIOLOGY REPORT*  Clinical Data: Post left IJ placement  PORTABLE CHEST - 1 VIEW  Comparison: 04/08/2012  Findings: Status post left IJ venous catheter placement with its tip in the upper right atrium, 1 cm below the cavoatrial junction. No pneumothorax.  Moderate layering right pleural effusion.  Stable cardiomegaly.  IMPRESSION: Status post left IJ venous catheter placement with its tip in the upper right atrium, 1 cm below the cavoatrial junction.  No pneumothorax.  Moderate layering right pleural effusion.  Original Report Authenticated By: Charline Bills, M.D.   Current Medications:    . amiodarone  400 mg Oral BID  . docusate sodium  100 mg Oral BID  . feeding supplement  1 Container Oral BID BM  . folic acid  1 mg  Oral Daily  . mulitivitamin with minerals  1 tablet Oral Daily  . pantoprazole  40 mg Oral Q1200  . polyethylene glycol  17 g Oral Daily  . sodium chloride  10 mL Intravenous Q12H  . sodium chloride  3 mL Intravenous Q12H  . sodium phosphate  1 enema Rectal Once  . thiamine  100 mg Oral Daily  . DISCONTD: insulin aspart  0-9 Units Subcutaneous Q4H  . DISCONTD: sodium chloride  500 mL Intravenous Once      . DISCONTD: phenylephrine (NEO-SYNEPHRINE) Adult infusion 30 mcg/min (04/10/12 0800)     ASSESSMENT AND PLAN: 1. Rectus sheath hematoma- H&H stable, We will continue to follow CBC and support with PRBCs PRN. Abdominal binder remains in place. Appears more stable today. MD advise on removing binder. 2. Acute on chronic renal failure- Creatinine is improving. I appreciate nephrology consult.  3. Cardiomyopathy, LVEF 30-35%- likely due to heavy ETOH. Further workup deferred at this point until clinically improved. Would avoid cath with renal failure. Outpt myoview may be appropriate at some point in the future.  Is and Os are negative, not on diuretic right now, follow.  4. VDRF, extubated 4/8, now off pressors, appreciate ongoing management by CCM who signed off 4/17. Outpt sleep study per Dr Craige Cotta. 5. Afib- No afib in > 24 hours, Continue amiodarone at 400mg  BID, MD advise on when to start to wean amiodarone. 6. Anticoag - Off anticoagulation at this point 2nd hematoma.  Prognosis is slightly improved. Pt has multiple organ failure and multiple chronic medical conditions. OK to increase activity.        Signed, Theodore Demark , PA-C 7:14 AM 04/12/2012   Addendum I have seen, examined the patient, and reviewed the above assessment and plan.  I agree with the above.  The patient has made very steady progress over the past few days.  He is hemodynamically stable and bleeding appears to have stopped.  I would keep the abdominal binder in place for another 48 hours and then remove.   On exam, he has stable good mentation, RRR, CTAB, 1+ BLE edema.  No focal neuro changes.  I will decrease amiodarone to 200mg  bid today.  He is not a candidate for anticoagulation with recent bleeding. Keep in stepdown today with plans to DC CVL and transfer telemetry tomorrow am.  Will likely require hospitalization over the weekend, at least until Sunday. Will have PT assess whether or not he will require rehab prior to discharge.  Co Sign: Hillis Range, MD 04/12/2012 11:31 AM

## 2012-04-12 NOTE — Progress Notes (Signed)
COMPLAINED OF DISCOMFORT ON FOLEY CATH SITE. PA AWARE, FOLEY CATH REMOVED COMPLETLEY TOLERATED WELL. URINAL WITHIN REACH.

## 2012-04-13 LAB — BASIC METABOLIC PANEL
Chloride: 95 mEq/L — ABNORMAL LOW (ref 96–112)
Creatinine, Ser: 1.52 mg/dL — ABNORMAL HIGH (ref 0.50–1.35)
GFR calc Af Amer: 55 mL/min — ABNORMAL LOW (ref >90)
GFR calc non Af Amer: 47 mL/min — ABNORMAL LOW (ref >90)
Potassium: 3.5 mEq/L (ref 3.5–5.1)

## 2012-04-13 LAB — CBC
MCHC: 32.8 g/dL (ref 30.0–36.0)
Platelets: 310 10*3/uL (ref 150–400)
RDW: 14.3 % (ref 11.5–15.5)
WBC: 12.6 10*3/uL — ABNORMAL HIGH (ref 4.0–10.5)

## 2012-04-13 LAB — PRO B NATRIURETIC PEPTIDE: Pro B Natriuretic peptide (BNP): 1522 pg/mL — ABNORMAL HIGH (ref 0–125)

## 2012-04-13 NOTE — Progress Notes (Signed)
   CARE MANAGEMENT NOTE 04/13/2012  Patient:  Martin [name], Martin [name]   Account Number:  [id number]  Date Initiated:  03/28/2012  Documentation initiated by:  GRAVES-BIGELOW,BRENDA  Subjective/Objective Assessment:   Pt admitted with CHF/SVT. 03/27/12 pt had urgent intubation and treatment of hypotension/resp distress. Pt on vancomycin and zosyn IV-off levophed and dopamine.     Action/Plan:   Anticipated DC Date:  04/03/2012   Anticipated DC Plan:  HOME W HOME HEALTH SERVICES      DC Planning Services  CM consult      Choice offered to / List presented to:  C-1 Patient           Status of service:  In process, will continue to follow Medicare Important Message given?   (If response is "NO", the following Medicare IM given date fields will be blank) Date Medicare IM given:   Date Additional Medicare IM given:    Discharge Disposition:    Per UR Regulation:    If discussed at Long Length of Stay Meetings, dates discussed:   04/04/2012  04/11/2012    Comments:  04/12/12 1400 Spoke with pt. and spouse about HH services. Pt. sitting up in chair off IV medications.  PT is recommending HH PT/OT depending on his progress until discharge.  Gave pt. a list of agencies and will follow for choice. Tera Mater, RN, BSN Nurse Case Manager [phone]   03-28-12 [address] [place] [phone] CM will continue to monitor for d/c disposition needs.

## 2012-04-13 NOTE — Progress Notes (Signed)
Patient Name: Martin [name] Date of Encounter: 04/13/2012  Principal Problem:  *CHF (congestive heart failure) Active Problems:  Atrial fibrillation  Acute respiratory failure  Renal failure  Acute on chronic systolic congestive heart failure  Acute on chronic renal failure  Rectus sheath hematoma  Hemorrhagic shock    SUBJECTIVE: Pt still weak, no chest pain, +DOE but no SOB at rest.  OBJECTIVE  Filed Vitals:   04/12/12 1959 04/12/12 2327 04/13/12 0313 04/13/12 0500  BP: 135/77 137/77 122/67   Pulse:  80 77   Temp: 98 F (36.7 C) 97.4 F (36.3 C) 97.7 F (36.5 C)   TempSrc: Oral Oral Oral   Resp: 29 23 21    Height:      Weight:    203 lb 4.2 oz (92.2 kg)  SpO2: 98% 96% 97%     Intake/Output Summary (Last 24 hours) at 04/13/12 1610 Last data filed at 04/13/12 9604  Gross per 24 hour  Intake    990 ml  Output    950 ml  Net     40 ml   Weight change: -10.6 oz (-0.3 kg)  Filed Weights   04/11/12 0445 04/12/12 0430 04/13/12 0500  Weight: 206 lb 5.6 oz (93.6 kg) 203 lb 14.8 oz (92.5 kg) 203 lb 4.2 oz (92.2 kg)     PHYSICAL EXAM General: Well developed, well nourished, male in no acute distress. Head: Normocephalic, atraumatic.  Neck: Supple without bruits, JVD minimally elevated. Lungs:  Resp regular and unlabored, significantly decreased breath sounds both bases, right > left. Heart: RRR, S1, S2, no S3, S4, soft systolic murmur. Abdomen: Soft, non-tender, non-distended, BS + x 4.  Extremities: No clubbing, cyanosis, trace edema.  Neuro: Alert and oriented X 3. Moves all extremities spontaneously. Psych: Normal affect.  LABS:  CBC: Basename 04/13/12 0411 04/12/12 0515  WBC 12.6* 12.4*  NEUTROABS -- --  HGB 8.7* 8.9*  HCT 26.5* 27.1*  MCV 89.5 91.2  PLT 310 297   INR: Basename 04/12/12 0515  INR 1.09   Basic Metabolic Panel: Basename 04/13/12 0411 04/12/12 0515  NA 133* 134*  K 3.5 3.6  CL 95* 93*  CO2 31 33*  GLUCOSE 122* 132*  BUN 27*  35*  CREATININE 1.52* 1.66*  CALCIUM 8.5 8.7  MG -- --  PHOS -- --   Lab Results  Component Value Date   CKTOTAL 96 03/27/2012   CKMB 7.3* 03/27/2012   TROPONINI <0.30 03/27/2012    BNP: Pro B Natriuretic peptide (BNP)  Date/Time Value Range Status  03/28/2012  4:15 AM 1803.0* 0-125 (pg/mL) Final  03/27/2012  4:10 AM 3740.0* 0-125 (pg/mL) Final   TELE: SR, no significant ectopy   Radiology/Studies: Dg Chest 2 View 04/08/2012  *RADIOLOGY REPORT*  Clinical Data: Fever, weakness and shortness of breath.  Right side hematoma.  Right pleural effusion.  CHEST - 2 VIEW  Comparison: Radiographs 04/06/2012 and 04/03/2012. Abdominal CT today.  Findings: A moderate sized right pleural effusion demonstrates free- flowing on the right lateral decubitus views.  There is underlying right basilar atelectasis.  No significant pleural effusion is apparent on the left.  IMPRESSION: Free-flowing moderate sized right pleural effusion.  Original Report Authenticated By: Gerrianne Scale, M.D.   Dg Chest Port 1 View 04/09/2012  *RADIOLOGY REPORT*  Clinical Data: Post left IJ placement  PORTABLE CHEST - 1 VIEW  Comparison: 04/08/2012  Findings: Status post left IJ venous catheter placement with its tip in the upper right atrium, 1  cm below the cavoatrial junction. No pneumothorax.  Moderate layering right pleural effusion.  Stable cardiomegaly.  IMPRESSION: Status post left IJ venous catheter placement with its tip in the upper right atrium, 1 cm below the cavoatrial junction.  No pneumothorax.  Moderate layering right pleural effusion.  Original Report Authenticated By: Charline Bills, M.D.    Current Medications:  . amiodarone  200 mg Oral BID  . docusate sodium  100 mg Oral BID  . feeding supplement  1 Container Oral BID BM  . folic acid  1 mg Oral Daily  . mulitivitamin with minerals  1 tablet Oral Daily  . pantoprazole  40 mg Oral Q1200  . polyethylene glycol  17 g Oral Daily  . sodium chloride  10 mL  Intravenous Q12H  . sodium chloride  3 mL Intravenous Q12H  . sodium phosphate  1 enema Rectal Once  . thiamine  100 mg Oral Daily    ASSESSMENT AND PLAN: 1. Rectus sheath hematoma- H&H stable,  Continue to follow CBC and support with PRBCs PRN. Decrease CBC to QOD. Abdominal binder off at times.  Continues to improve.  2. Acute on chronic renal failure- Creatinine is improving. Renal following.  3. Cardiomyopathy, LVEF 30-35%- likely due to heavy ETOH. Further workup deferred at this point until clinically improved. Would avoid cath with renal failure. Outpt myoview may be appropriate at some point in the future.   4. VDRF, extubated 4/8, now off pressors. Spoke with CCM (who signed off 4/17) about pleural effusion. They feel no further procedures are needed. The effusion should improve as his general condition improves. Outpt sleep study per Dr Craige Cotta.   5. Afib- No afib in > 48 hours, Continue amiodarone at 200 mg daily.   6. Anticoag - Off anticoagulation at this point 2nd hematoma.  Plan: Tx telemetry, PT ordered, OK to increase activity. Assess amount of deconditioning and sats with ambulation. May need home O2, at least for now or QHS.  Signed, Theodore Demark , PA-C 7:12 AM 04/13/2012     Patient seen and examined.  Agree with findings of R Barrett.  Patient with increased JVP, decreased BS at bases.  Card exam with RRR.  NO S3.  Ext with 1+ edema Still with increased volume.  Await recom of renal re diuresis. Plan for outpatient myoview. No coumadin  Follow H/H.

## 2012-04-13 NOTE — Progress Notes (Signed)
Physical Therapy Treatment Patient Details Name: Martin [name] MRN: [id number] DOB: 1946-09-17 Today's Date: 04/13/2012 Time: 9147-8295 PT Time Calculation (min): 24 min  PT Assessment / Plan / Recommendation Comments on Treatment Session  Pt improving his activity tolerance and balance. Anticipate will not need an assistive device on discharge. Wife present throughout session.    Follow Up Recommendations  Home health PT (vs none depending on progress)    Equipment Recommendations  Other (comment) (TBA)    Frequency Min 3X/week   Plan Discharge plan remains appropriate;Frequency remains appropriate    Precautions / Restrictions Precautions Precautions: Fall Required Braces or Orthoses: Other Brace/Splint Other Brace/Splint: abd binder due to Rt rectus hematoma   Pertinent Vitals/Pain VSS on telemetry    Mobility  Transfers Transfers: Sit to Stand;Stand to Sit Sit to Stand: 5: Supervision;With upper extremity assist;With armrests;From chair/3-in-1;From toilet;Other (comment) (with grab bar from toilet (on toilet on arrival)) Stand to Sit: 5: Supervision;With armrests;With upper extremity assist;To chair/3-in-1 Ambulation/Gait Ambulation/Gait Assistance: 4: Min guard Ambulation Distance (Feet): 90 Feet Assistive device: None (guarded pt at waist with gait belt) Ambulation/Gait Assistance Details: Pt ambulated 45 ft and then took standing rest break leaning against counter for ~3 minutes and then returned to room.  Gait Pattern: Step-through pattern;Wide base of support;Decreased trunk rotation Gait velocity: 1.38 ft/sec demonstrating incr fall risk (< 1.8 ft/sec)    Exercises     PT Goals Acute Rehab PT Goals PT Goal: Sit to Stand - Progress: Met PT Goal: Stand to Sit - Progress: Met PT Goal: Ambulate - Progress: Progressing toward goal  Visit Information  Last PT Received On: 04/13/12 Assistance Needed: +1    Subjective Data  Subjective: I feel better...still  weak.   Cognition  Overall Cognitive Status: Appears within functional limits for tasks assessed/performed Arousal/Alertness: Awake/alert Orientation Level: Appears intact for tasks assessed Behavior During Session: [facility] for tasks performed    Balance  High Level Balance High Level Balance Activites: Head turns;Turns;Sudden stops (performed with no loss of balance)  End of Session PT - End of Session Equipment Utilized During Treatment: Gait belt Activity Tolerance: Patient limited by fatigue Patient left: in chair;with call bell/phone within reach;with family/visitor present Nurse Communication: Mobility status    Perel Hauschild 04/13/2012, 2:30 PM Pager [phone]

## 2012-04-14 LAB — BASIC METABOLIC PANEL
Chloride: 95 mEq/L — ABNORMAL LOW (ref 96–112)
Creatinine, Ser: 1.43 mg/dL — ABNORMAL HIGH (ref 0.50–1.35)
GFR calc Af Amer: 59 mL/min — ABNORMAL LOW (ref >90)
GFR calc non Af Amer: 51 mL/min — ABNORMAL LOW (ref >90)
Potassium: 3.8 mEq/L (ref 3.5–5.1)

## 2012-04-14 NOTE — Progress Notes (Signed)
SUBJECTIVE:  No chest pain or SOB  OBJECTIVE:   Vitals:   Filed Vitals:   04/14/12 0500 04/14/12 0800 04/14/12 0805 04/14/12 1123  BP:  128/70    Pulse:      Temp:  98 F (36.7 C)  98.3 F (36.8 C)  TempSrc:  Oral Oral Oral  Resp:   18 16  Height:      Weight: 92.2 kg (203 lb 4.2 oz)     SpO2:  97% 97% 96%   I&O's:   Intake/Output Summary (Last 24 hours) at 04/14/12 1201 Last data filed at 04/14/12 1123  Gross per 24 hour  Intake    720 ml  Output   1150 ml  Net   -430 ml   TELEMETRY: Reviewed telemetry pt in NSR     PHYSICAL EXAM General: Well developed, well nourished, in no acute distress Head: Eyes PERRLA, No xanthomas.   Normal cephalic and atramatic  Lungs:   Clear bilaterally to auscultation and percussion. Heart:   HRRR S1 S2 Pulses are 2+ & equal. Abdomen: Bowel sounds are positive, abdomen soft and non-tender without masses  Extremities:   No clubbing, cyanosis or edema.  DP +1 Neuro: Alert and oriented X 3. Psych:  Good affect, responds appropriately   LABS: Basic Metabolic Panel:  Basename 04/14/12 0544 04/13/12 0411  NA 132* 133*  K 3.8 3.5  CL 95* 95*  CO2 28 31  GLUCOSE 121* 122*  BUN 26* 27*  CREATININE 1.43* 1.52*  CALCIUM 8.6 8.5  MG -- --  PHOS -- --   Liver Function Tests: No results found for this basename: AST:2,ALT:2,ALKPHOS:2,BILITOT:2,PROT:2,ALBUMIN:2 in the last 72 hours No results found for this basename: LIPASE:2,AMYLASE:2 in the last 72 hours CBC:  Basename 04/13/12 0411 04/12/12 0515  WBC 12.6* 12.4*  NEUTROABS -- --  HGB 8.7* 8.9*  HCT 26.5* 27.1*  MCV 89.5 91.2  PLT 310 297    Coag Panel:   Lab Results  Component Value Date   INR 1.09 04/12/2012   INR 1.08 04/11/2012   INR 1.35 04/10/2012    RADIOLOGY: Ct Abdomen Pelvis Wo Contrast  04/08/2012  **ADDENDUM** CREATED: 04/08/2012 16:21:58  Findings conveyed to the Dr. Gala Romney on 04/08/2012 at to 1620 hours.  **END ADDENDUM** SIGNED BY: Genevive Bi, M.D.     04/08/2012  *RADIOLOGY REPORT*  Clinical Data: rectus sheath hematoma  CT ABDOMEN AND PELVIS WITHOUT CONTRAST  Technique:  Multidetector CT imaging of the abdomen and pelvis was performed following the standard protocol without intravenous contrast.  Comparison: The CT 04/06/2012  Findings: There is interval expansion of the right rectus sheath hematoma which measures 17.5 x 11.6 cm in axial dimension compared to 16.2 x 8.8 cm on prior.  The measures 17.0  in craniocaudad dimension compared to 15 cm on prior.  There is new density within the hematoma consistent with interval hemorrhage.  There is a right pleural effusion with basilar atelectasis similar to prior.  No pericardial fluid.  No focal hepatic lesion.  The gallbladder, pancreas, spleen, adrenal glands, and kidneys are normal.  The stomach, small bowel, and colon are normal.  Abdominal aorta normal caliber.  No retroperitoneal periportal lymphadenopathy.  No free fluid the pelvis.  The prostate gland bladder are normal. There is a large fat filled left inguinal hernia versus lipoma of the spermatic cord.  No aggressive osseous lesions.  IMPRESSION:  1.  Interval expansion and increased density of large right rectus sheath hematoma consistent containing  bleeding into the hematoma.  2.  Stable right pleural effusion.  The findings were conveyed to patient's nurse on 04/08/2012 at 1600 hours.  She conveyed that findings were also conveyed to the PA [place] at time of scan. Original Report Authenticated By: Genevive Bi, M.D.   Ct Abdomen Pelvis Wo Contrast  04/06/2012  *RADIOLOGY REPORT*  Clinical Data: Abdominal pain and distension; suspected right-sided rectus sheath hematoma.  CT ABDOMEN AND PELVIS WITHOUT CONTRAST  Technique:  Multidetector CT imaging of the abdomen and pelvis was performed following the standard protocol without intravenous contrast.  Comparison: Abdominal radiograph performed 04/05/2012  Findings: A small to moderate  right-sided pleural effusion is noted, with partial collapse of the right lower lobe.  Mild left basilar atelectasis is noted.  The liver and spleen are unremarkable in appearance.  The gallbladder is within normal limits.  The pancreas and adrenal glands are unremarkable.  Nonspecific perinephric stranding noted bilaterally.  A small 1.5 cm hypodensity at the lower pole of the left kidney is thought to reflect a cyst.  No renal or ureteral stones are seen.  There is no evidence of hydronephrosis.  There is a very large focal fluid collection at the right rectus sheath, measuring approximately 17.2 x 15.1 x 7.9 cm.  This demonstrates a fluid-fluid level, compatible with an organizing hematoma.  Apparent nodularity about the periphery of collection is thought to reflect clot.  Mild overlying soft tissue inflammation is noted.  On correlation with the abdominal radiograph from 03/31/2012, this finding appears to be new.  Mass is considered to be much less likely, particularly given the fluid-fluid level.  No free fluid is identified.  The small bowel is unremarkable in appearance.  The stomach is within normal limits.  No acute vascular abnormalities are seen.  Mild scattered calcification is noted along the distal abdominal aorta and its branches.  The appendix is normal in caliber, without evidence for appendicitis.  Scattered diverticulosis is noted along the descending colon, without evidence of diverticulitis.  The colon is otherwise unremarkable in appearance.  A small to moderate left inguinal hernia is noted, containing only fat, with minimal associated soft tissue stranding.  The bladder is mildly distended and grossly unremarkable in appearance.  The prostate is enlarged, measuring 6.0 cm in transverse dimension.  No inguinal lymphadenopathy is seen.  No acute osseous abnormalities are identified.  Anterior bridging osteophytes are noted along the lower thoracic spine.  IMPRESSION:  1.  Very large focal  fluid collection at the right rectus sheath, measuring 17.2 x 15.1 x 7.9 cm. This demonstrates a fluid-fluid level, compatible with an organizing hematoma.  Apparent nodularity about the periphery of the collection is thought to reflect.  Mild overlying soft tissue inflammation noted.  As this appears to be new from prior studies, mass is considered to be much less likely. 2.  Small to moderate right-sided pleural effusion, with partial collapse of the right lower lobe; mild left basilar atelectasis noted. 3.  Small to moderate left inguinal hernia, containing only fat, with minimal associated soft tissue stranding. 4.  Scattered diverticulosis along the descending colon, without evidence of diverticulitis. 5.  Mild scattered calcification along the distal abdominal aorta and its branches. 6.  Likely small left renal cyst noted.  These results were called by telephone on 04/06/2012  at  01:52 a.m. to  Dr. Meridee Score, who verbally acknowledged these results.  Original Report Authenticated By: Tonia Ghent, M.D.   Dg Chest 2 View  04/08/2012  *RADIOLOGY REPORT*  Clinical Data: Fever, weakness and shortness of breath.  Right side hematoma.  Right pleural effusion.  CHEST - 2 VIEW  Comparison: Radiographs 04/06/2012 and 04/03/2012. Abdominal CT today.  Findings: A moderate sized right pleural effusion demonstrates free- flowing on the right lateral decubitus views.  There is underlying right basilar atelectasis.  No significant pleural effusion is apparent on the left.  IMPRESSION: Free-flowing moderate sized right pleural effusion.  Original Report Authenticated By: Gerrianne Scale, M.D.   Dg Abd 1 View  04/05/2012  *RADIOLOGY REPORT*  Clinical Data: Abdominal pain weakness.  ABDOMEN - 1 VIEW  Comparison: 03/31/2012.  Findings: Gas distended colon and small bowel centered centrally in a nonspecific distribution.  Although this may represent an ileus, obstruction not excluded.  The possibility of free  intraperitoneal air cannot be addressed on a supine view.  IMPRESSION: Abnormal although nonspecific bowel gas pattern as detailed above.  Original Report Authenticated By: Fuller Canada, M.D.   US Renal Port  04/09/2012  *RADIOLOGY REPORT*  Clinical Data: Renal failure.  RENAL/URINARY TRACT ULTRASOUND COMPLETE  Comparison:  The CT scan of the abdomen dated 04/08/2012  Findings:  Right Kidney:  Normal. 10.0 cm in length.  Left Kidney:  10.3 cm in length.  2 cm simple appearing cyst in the lower pole.  Bladder:  The bladder is empty with a Foley catheter in place.  IMPRESSION: No significant abnormality of the kidneys.  Original Report Authenticated By: Gwynn Burly, M.D.   Dg Chest Port 1 View  04/09/2012  *RADIOLOGY REPORT*  Clinical Data: Post left IJ placement  PORTABLE CHEST - 1 VIEW  Comparison: 04/08/2012  Findings: Status post left IJ venous catheter placement with its tip in the upper right atrium, 1 cm below the cavoatrial junction. No pneumothorax.  Moderate layering right pleural effusion.  Stable cardiomegaly.  IMPRESSION: Status post left IJ venous catheter placement with its tip in the upper right atrium, 1 cm below the cavoatrial junction.  No pneumothorax.  Moderate layering right pleural effusion.  Original Report Authenticated By: Charline Bills, M.D.   Dg Chest Port 1 View  04/06/2012  *RADIOLOGY REPORT*  Clinical Data: Pneumonia, follow  PORTABLE CHEST - 1 VIEW  Comparison: Of portable chest x-ray of 04/03/2012  Findings: The lungs remain poorly aerated with opacity throughout much of the right hemithorax possibly representing effusion rolling posteriorly. The left central venous line has been removed.  Mild cardiomegaly is stable.  No bony abnormality is seen.  IMPRESSION: Poor aeration with probable right effusion.  Stable cardiomegaly.  Original Report Authenticated By: Juline Patch, M.D.   Dg Chest Port 1 View  04/03/2012  *RADIOLOGY REPORT*  Clinical Data: Pulmonary edema  follow-up.  PORTABLE CHEST - 1 VIEW  Comparison: 04/02/2012.  Findings: The endotracheal tube has been removed. The previous enteric tube is no longer evident.  Tip of left internal jugular venous catheter terminates in lower portion of superior vena cava near cavoatrial junction.  No pneumothorax is evident.  There is stable cardiac silhouette enlargement.  There is continued hazy opacity projecting over the mid and lower portions of the right hemithorax which may be associated with right pleural effusion. There is minimal atelectasis.  No definite left pleural effusion is evident. There is mild vascular congestion pattern.  Osteophytes are seen in the spine.  IMPRESSION: Interval removal of endotracheal and enteric tubes.  Left internal jugular venous catheter in place without evidence of pneumothorax. Stable cardiac  silhouette enlargement.  Continued hazy opacity projecting over the mid and lower portions of the right hemithorax which may be associated with right pleural effusion.  There is minimal atelectasis. Mild vascular congestion pattern.  Original Report Authenticated By: Crawford Givens, M.D.   Dg Chest Port 1 View  04/02/2012  *RADIOLOGY REPORT*  Clinical Data: Evaluate endotracheal tube placement.  PORTABLE CHEST - 1 VIEW  Comparison: Multiple priors, most recently chest x-ray 04/01/2012.  Findings: An endotracheal tube is in place with tip 2.8 cm above the carina. A feeding tube is seen coiled with tip in the stomach. Lung volumes are low.  There continues to be a large hazy opacity overlying much of the right hemithorax, compatible with a moderate - large right-sided pleural effusion.  There are areas of associated atelectasis throughout much of the right mid and lower lung.  Probable small left-sided pleural effusion (unchanged). Mild pulmonary venous congestion without frank pulmonary edema. Heart size is mildly enlarged. The patient is rotated to the left on today's exam, resulting in distortion of  the mediastinal contours and reduced diagnostic sensitivity and specificity for mediastinal pathology.  IMPRESSION: 1.  Allowing for slight differences in patient positioning, the radiographic appearance of the chest is essentially unchanged, as detailed above.  Original Report Authenticated By: Florencia Reasons, M.D.   Dg Chest Port 1 View  04/01/2012  *RADIOLOGY REPORT*  Clinical Data: Evaluate ET tube placement  PORTABLE CHEST - 1 VIEW  Comparison: 03/31/2012  Findings: The endotracheal tube tip is above the carina.  There is a left IJ catheter with tip in the projection of the SVC. Feeding tube is looped within the body of the stomach.  There are bilateral pleural effusions present right greater than left.  Diffuse bilateral hazy lung opacities are unchanged from previous exam.  IMPRESSION:  1.  Stable position of endotracheal tube. 2.  Persistent bilateral pleural effusions and decreased aeration of both lungs.  Original Report Authenticated By: Rosealee Albee, M.D.   Dg Chest Port 1 View  03/31/2012  *RADIOLOGY REPORT*  Clinical Data: Endotracheal tube placement  PORTABLE CHEST - 1 VIEW  Comparison: 03/31/2012  Findings: Stable endotracheal and NG tube position.  Again noted opacification of the right hemithorax due to atelectasis, infiltrate or right pleural effusion.  Left IJ catheter is unchanged in position.  Cardiomegaly again noted.  No convincing pulmonary edema.  IMPRESSION: Stable support apparatus.  Again noted opacification of the right hemithorax probable due to combination right pleural effusion with atelectasis or infiltrate.  No convincing pulmonary edema. Cardiomegaly again noted.  Original Report Authenticated By: Natasha Mead, M.D.   Dg Chest Port 1 View  03/31/2012  *RADIOLOGY REPORT*  Clinical Data: Endotracheal tube  PORTABLE CHEST - 1 VIEW  Comparison: 03/30/2012  Findings: Endotracheal tube terminates 1.3 cm above the carina.  Stable enteric tube and left IJ venous catheter.   Increasing opacification of the right hemithorax, likely reflecting a layering right pleural effusion and underlying atelectasis, right lower lobe pneumonia not excluded.  Stable cardiomegaly.  IMPRESSION: Endotracheal tube terminates 1.3 cm above the carina.  Increasing opacification of the right hemithorax, likely reflecting a layering right pleural effusion and underlying atelectasis, right lower lobe pneumonia not excluded.  Stable cardiomegaly.  Original Report Authenticated By: Charline Bills, M.D.   Dg Chest Port 1 View  03/30/2012  *RADIOLOGY REPORT*  Clinical Data: Ventilator.  PORTABLE CHEST - 1 VIEW  Comparison: 03/29/2012  Findings: Cardiomegaly with vascular congestion.  Low lung volumes  with bilateral airspace opacities, right greater than left.  This could represent asymmetric edema or infection.  Small bilateral effusions.  No real change.  IMPRESSION: No significant interval change.  Original Report Authenticated By: Cyndie Chime, M.D.   Dg Chest Port 1 View  03/29/2012  *RADIOLOGY REPORT*  Clinical Data: Endotracheal tube placement  PORTABLE CHEST - 1 VIEW  Comparison: 03/28/2012; 03/27/2012; 03/26/2012  Findings: Grossly unchanged enlarged cardiac silhouette and mediastinal contours.  Stable position of support apparatus. Likely interval increase in size of bilateral pleural effusions with corresponding increase in right mid lung and left lower lung heterogeneous opacities. The pulmonary vasculature is less distinct on the present examination.  No definite pneumothorax.  Grossly unchanged bones.  IMPRESSION: 1.  Stable positioning of support apparatus.  No pneumothorax. 2.  Likely worsening pulmonary edema with corresponding increase in bilateral pleural effusions and bibasilar and mid lung opacities, right greater than left, possibly atelectasis.  Original Report Authenticated By: Waynard Reeds, M.D.   Dg Chest Port 1 View  03/28/2012  *RADIOLOGY REPORT*  Clinical Data: Endotracheal  tube evaluation.  PORTABLE CHEST - 1 VIEW  Comparison: 03/27/2012.  Findings: Tip of endotracheal tube terminates 2 cm above the carina.  Tip of left internal jugular venous catheter terminates in the lower portion of the superior vena cava.  Enteric tube is in place.  Distal portion enters stomach.  Tip is not included on image.  This no pneumothorax is evident.  Moderate cardiac silhouette enlargement is unchanged.  Mediastinal and hilar contours appear stable.  Prominence of left hilum and left main pulmonary artery segment are unchanged.  There is hazy opacity in the inferior and midportion of the right hemithorax with atelectasis, pulmonary opacity and infiltrate, and right pleural effusion without significant change.  There is elevation of the margin of the left hemidiaphragm with minimal left basilar atelectasis.  IMPRESSION: Tube and catheter positions are described above.  Stable cardiac silhouette.  There is hazy opacity in the inferior and midportion of the right hemithorax with atelectasis, pulmonary opacity and infiltrate, and right pleural effusion without significant change. There is elevation of the margin of the left hemidiaphragm with minimal left basilar atelectasis.enlargement.  Original Report Authenticated By: Crawford Givens, M.D.   Dg Chest Port 1 View  03/27/2012  *RADIOLOGY REPORT*  Clinical Data: ET tube placement.  PORTABLE CHEST - 1 VIEW  Comparison: 03/26/2012  Findings: Endotracheal tube is 2 cm above the carina.  Left central line tip in the SVC.  No pneumothorax.  There is cardiomegaly. Improving aeration in the left lung.  Moderate right pleural effusion and continued right lower lobe airspace disease.  Left and clear.  Low lung volumes.  IMPRESSION: Improving aeration in the right lung.  Moderate right effusion and continued right lower lobe airspace disease.  Cardiomegaly.  Original Report Authenticated By: Cyndie Chime, M.D.   Dg Chest Port 1 View  03/26/2012  *RADIOLOGY REPORT*   Clinical Data: Shortness of breath and hypertension.  PORTABLE CHEST - 1 VIEW  Comparison: None.  Findings: The heart is enlarged.  The mediastinal and hilar contours are prominent.  Large area of opacification involving the right lung may be a combination of effusion and atelectasis or infiltrate.  There is central vascular congestion without overt pulmonary edema.  No left-sided pleural effusion.  IMPRESSION:  1.  Cardiac enlargement and prominent mediastinal and hilar contours. 2.  Right lung process possibly a combination of effusion and atelectasis and/or infiltrate.  Original Report Authenticated By: P. Loralie Champagne, M.D.   Dg Abd Portable 1v  03/31/2012  *RADIOLOGY REPORT*  Clinical Data: Panda placement  PORTABLE ABDOMEN - 1 VIEW  Comparison: None.  Findings: [name] is coiled in the stomach with the tip in the body of the stomach.  NG tube also in the body of the stomach.  Mild ileus.  IMPRESSION: Panda tube and NG tube are in the body of the stomach.  Original Report Authenticated By: Camelia Phenes, M.D.      ASSESSMENT:  1.  Rectus sheath hematoma - h/h stable 2.  Acute on chronic renal failure - slowly improving 3.  Cardiomayopathy EF 30-35% - probable outpt myoview in light of renal failure 4. PAF maintaining NSR on amio 5.  Off systemic anticoagulation due to rectus sheath hematoma  PLAN:   1.  Check CBC today 2.  BMET in am  Quintella Reichert, MD  04/14/2012  12:01 PM

## 2012-04-15 DIAGNOSIS — I5021 Acute systolic (congestive) heart failure: Secondary | ICD-10-CM

## 2012-04-15 LAB — BASIC METABOLIC PANEL
CO2: 27 mEq/L (ref 19–32)
Calcium: 8.8 mg/dL (ref 8.4–10.5)
GFR calc Af Amer: 55 mL/min — ABNORMAL LOW (ref >90)
Sodium: 136 mEq/L (ref 135–145)

## 2012-04-15 LAB — CBC
MCV: 90.6 fL (ref 78.0–100.0)
Platelets: 369 10*3/uL (ref 150–400)
RBC: 3.3 MIL/uL — ABNORMAL LOW (ref 4.22–5.81)
RDW: 14.5 % (ref 11.5–15.5)
WBC: 11.7 10*3/uL — ABNORMAL HIGH (ref 4.0–10.5)

## 2012-04-15 MED ORDER — CARVEDILOL 3.125 MG PO TABS
3.1250 mg | ORAL_TABLET | Freq: Two times a day (BID) | ORAL | Status: DC
  Administered 2012-04-15 – 2012-04-17 (×4): 3.125 mg via ORAL
  Filled 2012-04-15 (×6): qty 1

## 2012-04-15 MED ORDER — FUROSEMIDE 10 MG/ML IJ SOLN
40.0000 mg | Freq: Two times a day (BID) | INTRAMUSCULAR | Status: DC
  Administered 2012-04-15: 40 mg via INTRAVENOUS
  Filled 2012-04-15 (×4): qty 4

## 2012-04-15 MED ORDER — POTASSIUM CHLORIDE CRYS ER 20 MEQ PO TBCR
20.0000 meq | EXTENDED_RELEASE_TABLET | Freq: Every day | ORAL | Status: DC
  Administered 2012-04-15 – 2012-04-17 (×3): 20 meq via ORAL
  Filled 2012-04-15 (×3): qty 1

## 2012-04-15 NOTE — Progress Notes (Signed)
SUBJECTIVE:    Feeling better. No CP/SOB. Having regular BMs. Ambulates slowly. Eager to go home  OBJECTIVE:   Vitals:   Filed Vitals:   04/15/12 0311 04/15/12 0559 04/15/12 0800 04/15/12 0802  BP: 114/60     Pulse:      Temp: 97.8 F (36.6 C)  98.5 F (36.9 C) 98.5 F (36.9 C)  TempSrc: Oral  Oral Oral  Resp:    18  Height:      Weight:  92.5 kg (203 lb 14.8 oz)    SpO2: 98%  97% 97%   I&O's:    Intake/Output Summary (Last 24 hours) at 04/15/12 1020 Last data filed at 04/15/12 0900  Gross per 24 hour  Intake    903 ml  Output   1650 ml  Net   -747 ml   TELEMETRY: Reviewed telemetry pt in NS   PHYSICAL EXAM General: Well developed, well nourished, in no acute distress Head: Normal Lungs:   Clear bilaterally to auscultation and percussion. Heart:   HRRR S1 S2 Pulses are 2+ & equal. Abdomen:  Distended. Firm but non-tender. Good BS Extremities:   No clubbing, cyanosis. 2+ edema.  DP +1 Neuro: Alert and oriented X 3. Psych:  Good affect, responds appropriately   LABS: Basic Metabolic Panel:  Basename 04/15/12 0625 04/14/12 0544  NA 136 132*  K 4.0 3.8  CL 97 95*  CO2 27 28  GLUCOSE 121* 121*  BUN 26* 26*  CREATININE 1.51* 1.43*  CALCIUM 8.8 8.6  MG -- --  PHOS -- --   Liver Function Tests: No results found for this basename: AST:2,ALT:2,ALKPHOS:2,BILITOT:2,PROT:2,ALBUMIN:2 in the last 72 hours No results found for this basename: LIPASE:2,AMYLASE:2 in the last 72 hours CBC:  Basename 04/15/12 0625 04/13/12 0411  WBC 11.7* 12.6*  NEUTROABS -- --  HGB 9.6* 8.7*  HCT 29.9* 26.5*  MCV 90.6 89.5  PLT 369 310    Coag Panel:   Lab Results  Component Value Date   INR 1.09 04/12/2012   INR 1.08 04/11/2012   INR 1.35 04/10/2012    RADIOLOGY: Ct Abdomen Pelvis Wo Contrast  04/08/2012  **ADDENDUM** CREATED: 04/08/2012 16:21:58  Findings conveyed to the Dr. Gala Romney on 04/08/2012 at to 1620 hours.  **END ADDENDUM** SIGNED BY: Genevive Bi, M.D.     04/08/2012  *RADIOLOGY REPORT*  Clinical Data: rectus sheath hematoma  CT ABDOMEN AND PELVIS WITHOUT CONTRAST  Technique:  Multidetector CT imaging of the abdomen and pelvis was performed following the standard protocol without intravenous contrast.  Comparison: The CT 04/06/2012  Findings: There is interval expansion of the right rectus sheath hematoma which measures 17.5 x 11.6 cm in axial dimension compared to 16.2 x 8.8 cm on prior.  The measures 17.0  in craniocaudad dimension compared to 15 cm on prior.  There is new density within the hematoma consistent with interval hemorrhage.  There is a right pleural effusion with basilar atelectasis similar to prior.  No pericardial fluid.  No focal hepatic lesion.  The gallbladder, pancreas, spleen, adrenal glands, and kidneys are normal.  The stomach, small bowel, and colon are normal.  Abdominal aorta normal caliber.  No retroperitoneal periportal lymphadenopathy.  No free fluid the pelvis.  The prostate gland bladder are normal. There is a large fat filled left inguinal hernia versus lipoma of the spermatic cord.  No aggressive osseous lesions.  IMPRESSION:  1.  Interval expansion and increased density of large right rectus sheath hematoma consistent containing bleeding into  the hematoma.  2.  Stable right pleural effusion.  The findings were conveyed to patient's nurse on 04/08/2012 at 1600 hours.  She conveyed that findings were also conveyed to the PA [place] at time of scan. Original Report Authenticated By: Genevive Bi, M.D   ASSESSMENT:  1.  Rectus sheath hematoma - h/h improving 2.  Acute on chronic renal failure -  Improved. Cr now stable about 1.5 3.  Acute systolic HF      - EF 30-35%  By echo 4/2 with global HK- (no known h/o CAD) Consider repeating echo and outpt myoview in light of renal failure 4. PAF maintaining NSR on amio         -Off systemic anticoagulation due to rectus sheath hematoma 5. SVT on admit. - resolved 6. H/o  ETOH abuse  PLAN:    He is improving steadily. Now volume overloaded. Will give lasix. Add low-dose b-blocker. No ACE-I/ARB due to renal failure. Place TED hose. PT following. Will transfer to tele. Suspect he can go home soon.  [name], MD  04/15/2012  10:20 AM

## 2012-04-16 DIAGNOSIS — I509 Heart failure, unspecified: Secondary | ICD-10-CM

## 2012-04-16 LAB — BASIC METABOLIC PANEL
CO2: 26 mEq/L (ref 19–32)
Chloride: 94 mEq/L — ABNORMAL LOW (ref 96–112)
Creatinine, Ser: 1.48 mg/dL — ABNORMAL HIGH (ref 0.50–1.35)
GFR calc Af Amer: 56 mL/min — ABNORMAL LOW (ref >90)
Potassium: 4 mEq/L (ref 3.5–5.1)
Sodium: 131 mEq/L — ABNORMAL LOW (ref 135–145)

## 2012-04-16 MED ORDER — FUROSEMIDE 40 MG PO TABS
40.0000 mg | ORAL_TABLET | Freq: Two times a day (BID) | ORAL | Status: DC
  Administered 2012-04-16 – 2012-04-17 (×3): 40 mg via ORAL
  Filled 2012-04-16 (×5): qty 1

## 2012-04-16 NOTE — Progress Notes (Signed)
Nutrition Follow-up  Diet Order:  None currently?   Resource Breeze ordered BID Pt improving, transfered back to 4700 from 2900. Pt states that he has a good appetite and is eating well. PO intake 75-100% last few days.   Meds: Scheduled Meds:   . amiodarone  200 mg Oral BID  . carvedilol  3.125 mg Oral BID WC  . docusate sodium  100 mg Oral BID  . feeding supplement  1 Container Oral BID BM  . folic acid  1 mg Oral Daily  . furosemide  40 mg Oral BID  . mulitivitamin with minerals  1 tablet Oral Daily  . pantoprazole  40 mg Oral Q1200  . polyethylene glycol  17 g Oral Daily  . potassium chloride  20 mEq Oral Daily  . sodium chloride  10 mL Intravenous Q12H  . sodium chloride  3 mL Intravenous Q12H  . sodium phosphate  1 enema Rectal Once  . thiamine  100 mg Oral Daily  . DISCONTD: furosemide  40 mg Intravenous BID   Continuous Infusions:  PRN Meds:.sodium chloride, acetaminophen, ipratropium, levalbuterol, LORazepam, ondansetron (ZOFRAN) IV, oxyCODONE-acetaminophen, sodium chloride  Labs:  CMP     Component Value Date/Time   NA 131* 04/16/2012 0522   K 4.0 04/16/2012 0522   CL 94* 04/16/2012 0522   CO2 26 04/16/2012 0522   GLUCOSE 124* 04/16/2012 0522   BUN 27* 04/16/2012 0522   CREATININE 1.48* 04/16/2012 0522   CALCIUM 8.7 04/16/2012 0522   PROT 6.5 03/29/2012 1430   ALBUMIN 2.6* 03/29/2012 1430   AST 42* 03/29/2012 1430   ALT 101* 03/29/2012 1430   ALKPHOS 104 03/29/2012 1430   BILITOT 0.6 03/29/2012 1430   GFRNONAA 49* 04/16/2012 0522   GFRAA 56* 04/16/2012 0522     Intake/Output Summary (Last 24 hours) at 04/16/12 1151 Last data filed at 04/16/12 0915  Gross per 24 hour  Intake    364 ml  Output   1600 ml  Net  -1236 ml    Weight Status:  200 lbs, stable  Re-estimated needs:  1800-2000 kcal, 80-100 gm protein  Nutrition Dx:  Inadequate oral intake now resolving  Goal:  PO intake of meals and supplements to provide > 90% of estimated needs, still unmet  Intervention:     1. Continue with Resource Breeze BID 2. RD will continue to follow  Monitor:  PO intake, weight, labs    Rudean Haskell Pager #:  [phone]

## 2012-04-16 NOTE — Progress Notes (Signed)
Patient ID: Wyland Rastetter, male   DOB: [birth year], 66 y.o.   MRN: [id number]   Patient Name: Martin [name] Date of Encounter: 04/16/2012    SUBJECTIVE  Feeling better. Beginning to ambulate. Diuresed 1300 with IV Lasix.  CURRENT MEDS    . amiodarone  200 mg Oral BID  . carvedilol  3.125 mg Oral BID WC  . docusate sodium  100 mg Oral BID  . feeding supplement  1 Container Oral BID BM  . folic acid  1 mg Oral Daily  . furosemide  40 mg Intravenous BID  . mulitivitamin with minerals  1 tablet Oral Daily  . pantoprazole  40 mg Oral Q1200  . polyethylene glycol  17 g Oral Daily  . potassium chloride  20 mEq Oral Daily  . sodium chloride  10 mL Intravenous Q12H  . sodium chloride  3 mL Intravenous Q12H  . sodium phosphate  1 enema Rectal Once  . thiamine  100 mg Oral Daily    OBJECTIVE  Filed Vitals:   04/15/12 1537 04/15/12 2215 04/16/12 0207 04/16/12 0443  BP: 136/74 129/82 124/69 134/80  Pulse: 81 78 73 77  Temp: 97.5 F (36.4 C) 98.7 F (37.1 C) 97.3 F (36.3 C) 97.8 F (36.6 C)  TempSrc: Oral Oral Oral Oral  Resp: 19 18 18 18   Height:      Weight:    200 lb 13.4 oz (91.1 kg)  SpO2: 100% 99% 98% 99%    Intake/Output Summary (Last 24 hours) at 04/16/12 0735 Last data filed at 04/16/12 0448  Gross per 24 hour  Intake    244 ml  Output   1600 ml  Net  -1356 ml   Filed Weights   04/15/12 0559 04/15/12 1316 04/16/12 0443  Weight: 203 lb 14.8 oz (92.5 kg) 205 lb 0.4 oz (93 kg) 200 lb 13.4 oz (91.1 kg)    PHYSICAL EXAM  General: Pleasant, NAD. Obese Neuro: Alert and oriented X 3. Moves all extremities spontaneously. Psych: Normal affect. HEENT:  Normal  Neck: Supple without bruits or JVD. Lungs:  Resp regular and unlabored, CTA. Heart: RRR no s3, s4, or murmurs. Abdomen: Soft, non-tender, non-distended, BS + x 4.  Extremities: No clubbing, cyanosis, 2+ edema. DP/PT/Radials 2+ and equal bilaterally.  Accessory Clinical Findings  CBC  Basename 04/15/12  0625  WBC 11.7*  NEUTROABS --  HGB 9.6*  HCT 29.9*  MCV 90.6  PLT 369   Basic Metabolic Panel  Basename 04/16/12 0522 04/15/12 0625  NA 131* 136  K 4.0 4.0  CL 94* 97  CO2 26 27  GLUCOSE 124* 121*  BUN 27* 26*  CREATININE 1.48* 1.51*  CALCIUM 8.7 8.8  MG -- --  PHOS -- --   Liver Function Tests No results found for this basename: AST:2,ALT:2,ALKPHOS:2,BILITOT:2,PROT:2,ALBUMIN:2 in the last 72 hours No results found for this basename: LIPASE:2,AMYLASE:2 in the last 72 hours Cardiac Enzymes No results found for this basename: CKTOTAL:3,CKMB:3,CKMBINDEX:3,TROPONINI:3 in the last 72 hours BNP No components found with this basename: POCBNP:3 D-Dimer No results found for this basename: DDIMER:2 in the last 72 hours Hemoglobin A1C No results found for this basename: HGBA1C in the last 72 hours Fasting Lipid Panel No results found for this basename: CHOL,HDL,LDLCALC,TRIG,CHOLHDL,LDLDIRECT in the last 72 hours Thyroid Function Tests No results found for this basename: TSH,T4TOTAL,FREET3,T3FREE,THYROIDAB in the last 72 hours  TELE  NSR  EC  Radiology/Studies  Ct Abdomen Pelvis Wo Contrast  04/08/2012  **ADDENDUM** CREATED: 04/08/2012 16:21:58  Findings conveyed to the Dr. Gala Romney on 04/08/2012 at to 1620 hours.  **END ADDENDUM** SIGNED BY: Genevive Bi, M.D.    04/08/2012  *RADIOLOGY REPORT*  Clinical Data: rectus sheath hematoma  CT ABDOMEN AND PELVIS WITHOUT CONTRAST  Technique:  Multidetector CT imaging of the abdomen and pelvis was performed following the standard protocol without intravenous contrast.  Comparison: The CT 04/06/2012  Findings: There is interval expansion of the right rectus sheath hematoma which measures 17.5 x 11.6 cm in axial dimension compared to 16.2 x 8.8 cm on prior.  The measures 17.0  in craniocaudad dimension compared to 15 cm on prior.  There is new density within the hematoma consistent with interval hemorrhage.  There is a right pleural  effusion with basilar atelectasis similar to prior.  No pericardial fluid.  No focal hepatic lesion.  The gallbladder, pancreas, spleen, adrenal glands, and kidneys are normal.  The stomach, small bowel, and colon are normal.  Abdominal aorta normal caliber.  No retroperitoneal periportal lymphadenopathy.  No free fluid the pelvis.  The prostate gland bladder are normal. There is a large fat filled left inguinal hernia versus lipoma of the spermatic cord.  No aggressive osseous lesions.  IMPRESSION:  1.  Interval expansion and increased density of large right rectus sheath hematoma consistent containing bleeding into the hematoma.  2.  Stable right pleural effusion.  The findings were conveyed to patient's nurse on 04/08/2012 at 1600 hours.  She conveyed that findings were also conveyed to the PA [place] at time of scan. Original Report Authenticated By: Genevive Bi, M.D.   Ct Abdomen Pelvis Wo Contrast  04/06/2012  *RADIOLOGY REPORT*  Clinical Data: Abdominal pain and distension; suspected right-sided rectus sheath hematoma.  CT ABDOMEN AND PELVIS WITHOUT CONTRAST  Technique:  Multidetector CT imaging of the abdomen and pelvis was performed following the standard protocol without intravenous contrast.  Comparison: Abdominal radiograph performed 04/05/2012  Findings: A small to moderate right-sided pleural effusion is noted, with partial collapse of the right lower lobe.  Mild left basilar atelectasis is noted.  The liver and spleen are unremarkable in appearance.  The gallbladder is within normal limits.  The pancreas and adrenal glands are unremarkable.  Nonspecific perinephric stranding noted bilaterally.  A small 1.5 cm hypodensity at the lower pole of the left kidney is thought to reflect a cyst.  No renal or ureteral stones are seen.  There is no evidence of hydronephrosis.  There is a very large focal fluid collection at the right rectus sheath, measuring approximately 17.2 x 15.1 x 7.9 cm.  This  demonstrates a fluid-fluid level, compatible with an organizing hematoma.  Apparent nodularity about the periphery of collection is thought to reflect clot.  Mild overlying soft tissue inflammation is noted.  On correlation with the abdominal radiograph from 03/31/2012, this finding appears to be new.  Mass is considered to be much less likely, particularly given the fluid-fluid level.  No free fluid is identified.  The small bowel is unremarkable in appearance.  The stomach is within normal limits.  No acute vascular abnormalities are seen.  Mild scattered calcification is noted along the distal abdominal aorta and its branches.  The appendix is normal in caliber, without evidence for appendicitis.  Scattered diverticulosis is noted along the descending colon, without evidence of diverticulitis.  The colon is otherwise unremarkable in appearance.  A small to moderate left inguinal hernia is noted, containing only fat, with minimal associated soft tissue stranding.  The  bladder is mildly distended and grossly unremarkable in appearance.  The prostate is enlarged, measuring 6.0 cm in transverse dimension.  No inguinal lymphadenopathy is seen.  No acute osseous abnormalities are identified.  Anterior bridging osteophytes are noted along the lower thoracic spine.  IMPRESSION:  1.  Very large focal fluid collection at the right rectus sheath, measuring 17.2 x 15.1 x 7.9 cm. This demonstrates a fluid-fluid level, compatible with an organizing hematoma.  Apparent nodularity about the periphery of the collection is thought to reflect.  Mild overlying soft tissue inflammation noted.  As this appears to be new from prior studies, mass is considered to be much less likely. 2.  Small to moderate right-sided pleural effusion, with partial collapse of the right lower lobe; mild left basilar atelectasis noted. 3.  Small to moderate left inguinal hernia, containing only fat, with minimal associated soft tissue stranding. 4.   Scattered diverticulosis along the descending colon, without evidence of diverticulitis. 5.  Mild scattered calcification along the distal abdominal aorta and its branches. 6.  Likely small left renal cyst noted.  These results were called by telephone on 04/06/2012  at  01:52 a.m. to  Dr. Meridee Score, who verbally acknowledged these results.  Original Report Authenticated By: Tonia Ghent, M.D.   Dg Chest 2 View  04/08/2012  *RADIOLOGY REPORT*  Clinical Data: Fever, weakness and shortness of breath.  Right side hematoma.  Right pleural effusion.  CHEST - 2 VIEW  Comparison: Radiographs 04/06/2012 and 04/03/2012. Abdominal CT today.  Findings: A moderate sized right pleural effusion demonstrates free- flowing on the right lateral decubitus views.  There is underlying right basilar atelectasis.  No significant pleural effusion is apparent on the left.  IMPRESSION: Free-flowing moderate sized right pleural effusion.  Original Report Authenticated By: Gerrianne Scale, M.D.   Dg Abd 1 View  04/05/2012  *RADIOLOGY REPORT*  Clinical Data: Abdominal pain weakness.  ABDOMEN - 1 VIEW  Comparison: 03/31/2012.  Findings: Gas distended colon and small bowel centered centrally in a nonspecific distribution.  Although this may represent an ileus, obstruction not excluded.  The possibility of free intraperitoneal air cannot be addressed on a supine view.  IMPRESSION: Abnormal although nonspecific bowel gas pattern as detailed above.  Original Report Authenticated By: Fuller Canada, M.D.   US Renal Port  04/09/2012  *RADIOLOGY REPORT*  Clinical Data: Renal failure.  RENAL/URINARY TRACT ULTRASOUND COMPLETE  Comparison:  The CT scan of the abdomen dated 04/08/2012  Findings:  Right Kidney:  Normal. 10.0 cm in length.  Left Kidney:  10.3 cm in length.  2 cm simple appearing cyst in the lower pole.  Bladder:  The bladder is empty with a Foley catheter in place.  IMPRESSION: No significant abnormality of the kidneys.   Original Report Authenticated By: Gwynn Burly, M.D.   Dg Chest Port 1 View  04/09/2012  *RADIOLOGY REPORT*  Clinical Data: Post left IJ placement  PORTABLE CHEST - 1 VIEW  Comparison: 04/08/2012  Findings: Status post left IJ venous catheter placement with its tip in the upper right atrium, 1 cm below the cavoatrial junction. No pneumothorax.  Moderate layering right pleural effusion.  Stable cardiomegaly.  IMPRESSION: Status post left IJ venous catheter placement with its tip in the upper right atrium, 1 cm below the cavoatrial junction.  No pneumothorax.  Moderate layering right pleural effusion.  Original Report Authenticated By: Charline Bills, M.D.   Dg Chest Port 1 View  04/06/2012  *RADIOLOGY REPORT*  Clinical Data: Pneumonia, follow  PORTABLE CHEST - 1 VIEW  Comparison: Of portable chest x-ray of 04/03/2012  Findings: The lungs remain poorly aerated with opacity throughout much of the right hemithorax possibly representing effusion rolling posteriorly. The left central venous line has been removed.  Mild cardiomegaly is stable.  No bony abnormality is seen.  IMPRESSION: Poor aeration with probable right effusion.  Stable cardiomegaly.  Original Report Authenticated By: Juline Patch, M.D.   Dg Chest Port 1 View  04/03/2012  *RADIOLOGY REPORT*  Clinical Data: Pulmonary edema follow-up.  PORTABLE CHEST - 1 VIEW  Comparison: 04/02/2012.  Findings: The endotracheal tube has been removed. The previous enteric tube is no longer evident.  Tip of left internal jugular venous catheter terminates in lower portion of superior vena cava near cavoatrial junction.  No pneumothorax is evident.  There is stable cardiac silhouette enlargement.  There is continued hazy opacity projecting over the mid and lower portions of the right hemithorax which may be associated with right pleural effusion. There is minimal atelectasis.  No definite left pleural effusion is evident. There is mild vascular congestion  pattern.  Osteophytes are seen in the spine.  IMPRESSION: Interval removal of endotracheal and enteric tubes.  Left internal jugular venous catheter in place without evidence of pneumothorax. Stable cardiac silhouette enlargement.  Continued hazy opacity projecting over the mid and lower portions of the right hemithorax which may be associated with right pleural effusion.  There is minimal atelectasis. Mild vascular congestion pattern.  Original Report Authenticated By: Crawford Givens, M.D.   Dg Chest Port 1 View  04/02/2012  *RADIOLOGY REPORT*  Clinical Data: Evaluate endotracheal tube placement.  PORTABLE CHEST - 1 VIEW  Comparison: Multiple priors, most recently chest x-ray 04/01/2012.  Findings: An endotracheal tube is in place with tip 2.8 cm above the carina. A feeding tube is seen coiled with tip in the stomach. Lung volumes are low.  There continues to be a large hazy opacity overlying much of the right hemithorax, compatible with a moderate - large right-sided pleural effusion.  There are areas of associated atelectasis throughout much of the right mid and lower lung.  Probable small left-sided pleural effusion (unchanged). Mild pulmonary venous congestion without frank pulmonary edema. Heart size is mildly enlarged. The patient is rotated to the left on today's exam, resulting in distortion of the mediastinal contours and reduced diagnostic sensitivity and specificity for mediastinal pathology.  IMPRESSION: 1.  Allowing for slight differences in patient positioning, the radiographic appearance of the chest is essentially unchanged, as detailed above.  Original Report Authenticated By: Florencia Reasons, M.D.   Dg Chest Port 1 View  04/01/2012  *RADIOLOGY REPORT*  Clinical Data: Evaluate ET tube placement  PORTABLE CHEST - 1 VIEW  Comparison: 03/31/2012  Findings: The endotracheal tube tip is above the carina.  There is a left IJ catheter with tip in the projection of the SVC. Feeding tube is looped within  the body of the stomach.  There are bilateral pleural effusions present right greater than left.  Diffuse bilateral hazy lung opacities are unchanged from previous exam.  IMPRESSION:  1.  Stable position of endotracheal tube. 2.  Persistent bilateral pleural effusions and decreased aeration of both lungs.  Original Report Authenticated By: Rosealee Albee, M.D.   Dg Chest Port 1 View  03/31/2012  *RADIOLOGY REPORT*  Clinical Data: Endotracheal tube placement  PORTABLE CHEST - 1 VIEW  Comparison: 03/31/2012  Findings: Stable endotracheal and NG tube  position.  Again noted opacification of the right hemithorax due to atelectasis, infiltrate or right pleural effusion.  Left IJ catheter is unchanged in position.  Cardiomegaly again noted.  No convincing pulmonary edema.  IMPRESSION: Stable support apparatus.  Again noted opacification of the right hemithorax probable due to combination right pleural effusion with atelectasis or infiltrate.  No convincing pulmonary edema. Cardiomegaly again noted.  Original Report Authenticated By: Natasha Mead, M.D.   Dg Chest Port 1 View  03/31/2012  *RADIOLOGY REPORT*  Clinical Data: Endotracheal tube  PORTABLE CHEST - 1 VIEW  Comparison: 03/30/2012  Findings: Endotracheal tube terminates 1.3 cm above the carina.  Stable enteric tube and left IJ venous catheter.  Increasing opacification of the right hemithorax, likely reflecting a layering right pleural effusion and underlying atelectasis, right lower lobe pneumonia not excluded.  Stable cardiomegaly.  IMPRESSION: Endotracheal tube terminates 1.3 cm above the carina.  Increasing opacification of the right hemithorax, likely reflecting a layering right pleural effusion and underlying atelectasis, right lower lobe pneumonia not excluded.  Stable cardiomegaly.  Original Report Authenticated By: Charline Bills, M.D.   Dg Chest Port 1 View  03/30/2012  *RADIOLOGY REPORT*  Clinical Data: Ventilator.  PORTABLE CHEST - 1 VIEW   Comparison: 03/29/2012  Findings: Cardiomegaly with vascular congestion.  Low lung volumes with bilateral airspace opacities, right greater than left.  This could represent asymmetric edema or infection.  Small bilateral effusions.  No real change.  IMPRESSION: No significant interval change.  Original Report Authenticated By: Cyndie Chime, M.D.   Dg Chest Port 1 View  03/29/2012  *RADIOLOGY REPORT*  Clinical Data: Endotracheal tube placement  PORTABLE CHEST - 1 VIEW  Comparison: 03/28/2012; 03/27/2012; 03/26/2012  Findings: Grossly unchanged enlarged cardiac silhouette and mediastinal contours.  Stable position of support apparatus. Likely interval increase in size of bilateral pleural effusions with corresponding increase in right mid lung and left lower lung heterogeneous opacities. The pulmonary vasculature is less distinct on the present examination.  No definite pneumothorax.  Grossly unchanged bones.  IMPRESSION: 1.  Stable positioning of support apparatus.  No pneumothorax. 2.  Likely worsening pulmonary edema with corresponding increase in bilateral pleural effusions and bibasilar and mid lung opacities, right greater than left, possibly atelectasis.  Original Report Authenticated By: Waynard Reeds, M.D.   Dg Chest Port 1 View  03/28/2012  *RADIOLOGY REPORT*  Clinical Data: Endotracheal tube evaluation.  PORTABLE CHEST - 1 VIEW  Comparison: 03/27/2012.  Findings: Tip of endotracheal tube terminates 2 cm above the carina.  Tip of left internal jugular venous catheter terminates in the lower portion of the superior vena cava.  Enteric tube is in place.  Distal portion enters stomach.  Tip is not included on image.  This no pneumothorax is evident.  Moderate cardiac silhouette enlargement is unchanged.  Mediastinal and hilar contours appear stable.  Prominence of left hilum and left main pulmonary artery segment are unchanged.  There is hazy opacity in the inferior and midportion of the right  hemithorax with atelectasis, pulmonary opacity and infiltrate, and right pleural effusion without significant change.  There is elevation of the margin of the left hemidiaphragm with minimal left basilar atelectasis.  IMPRESSION: Tube and catheter positions are described above.  Stable cardiac silhouette.  There is hazy opacity in the inferior and midportion of the right hemithorax with atelectasis, pulmonary opacity and infiltrate, and right pleural effusion without significant change. There is elevation of the margin of the left hemidiaphragm with minimal  left basilar atelectasis.enlargement.  Original Report Authenticated By: Crawford Givens, M.D.   Dg Chest Port 1 View  03/27/2012  *RADIOLOGY REPORT*  Clinical Data: ET tube placement.  PORTABLE CHEST - 1 VIEW  Comparison: 03/26/2012  Findings: Endotracheal tube is 2 cm above the carina.  Left central line tip in the SVC.  No pneumothorax.  There is cardiomegaly. Improving aeration in the left lung.  Moderate right pleural effusion and continued right lower lobe airspace disease.  Left and clear.  Low lung volumes.  IMPRESSION: Improving aeration in the right lung.  Moderate right effusion and continued right lower lobe airspace disease.  Cardiomegaly.  Original Report Authenticated By: Cyndie Chime, M.D.   Dg Chest Port 1 View  03/26/2012  *RADIOLOGY REPORT*  Clinical Data: Shortness of breath and hypertension.  PORTABLE CHEST - 1 VIEW  Comparison: None.  Findings: The heart is enlarged.  The mediastinal and hilar contours are prominent.  Large area of opacification involving the right lung may be a combination of effusion and atelectasis or infiltrate.  There is central vascular congestion without overt pulmonary edema.  No left-sided pleural effusion.  IMPRESSION:  1.  Cardiac enlargement and prominent mediastinal and hilar contours. 2.  Right lung process possibly a combination of effusion and atelectasis and/or infiltrate.  Original Report Authenticated By:  P. Loralie Champagne, M.D.   Dg Abd Portable 1v  03/31/2012  *RADIOLOGY REPORT*  Clinical Data: Panda placement  PORTABLE ABDOMEN - 1 VIEW  Comparison: None.  Findings: [name] is coiled in the stomach with the tip in the body of the stomach.  NG tube also in the body of the stomach.  Mild ileus.  IMPRESSION: Panda tube and NG tube are in the body of the stomach.  Original Report Authenticated By: Camelia Phenes, M.D.    ASSESSMENT AND PLAN  Principal Problem:  *CHF (congestive heart failure) Active Problems:  Atrial fibrillation  Acute respiratory failure  Renal failure  Acute on chronic systolic congestive heart failure  Acute on chronic renal failure  Rectus sheath hematoma  Hemorrhagic shock  Acute systolic heart failure    Stable and improving. Still mildly volume overloaded but can change to po Lasix and try to discharge home in next day or to. Will involve Care Management. Lives at home with his wife who is able to help care for him.m Will need close followup within 7 days of discharge.  Signed, Valera Castle MD

## 2012-04-16 NOTE — Progress Notes (Signed)
  Echocardiogram  2D Echocardiogram has been performed.  Martin [name] L 04/16/2012, 8:25 AM

## 2012-04-16 NOTE — Progress Notes (Signed)
Physical Therapy Treatment Patient Details Name: Martin [name] MRN: [id number] DOB: 1946/09/23 Today's Date: 04/16/2012 Time: 4098-1191 PT Time Calculation (min): 19 min  PT Assessment / Plan / Recommendation Comments on Treatment Session  Pt mobility much improved. Will see pt once more to advance HEP. Adding pt to mobility team to prevent weakness from inactivity.  Also instructed pt to ambulate with nsg (for safety) daily.      Follow Up Recommendations  Home health PT    Equipment Recommendations  None recommended by PT    Frequency Min 2X/week   Plan Discharge plan remains appropriate;Frequency needs to be updated    Precautions / Restrictions Precautions Precautions: Fall Required Braces or Orthoses: Other Brace/Splint Other Brace/Splint: abd binder due to Rt rectus hematoma (pt not wearing today secondary to rash ) Restrictions Weight Bearing Restrictions: No    Pertinent Vitals/Pain Pt denies pain HR in low to mid 80's and O2 sats in mid 90's while ambulating.     Mobility  Bed Mobility Bed Mobility: Rolling Right;Rolling Left;Sit to Supine;Left Sidelying to Sit Rolling Right: 7: Independent Rolling Left: 7: Independent Left Sidelying to Sit: 7: Independent;HOB flat Sit to Supine: 7: Independent;HOB flat Transfers Transfers: Sit to Stand;Stand to Sit Sit to Stand: 7: Independent;From bed;From chair/3-in-1;With upper extremity assist Stand to Sit: 7: Independent;With upper extremity assist;To bed;To chair/3-in-1 Stand Pivot Transfers: 7: Independent Ambulation/Gait Ambulation/Gait Assistance: 5: Supervision Ambulation Distance (Feet): 300 Feet Assistive device: None Ambulation/Gait Assistance Details: Pt required 3 standing rest breaks less than 1 min each.   Gait Pattern: Step-through pattern;Lateral trunk lean to left;Decreased trunk rotation;Wide base of support Gait velocity: 30 feet/11 seconds General Gait Details: Pt unsteady with change of direction,  ambulates very fast, repeated verbal cues to slow down and stay under control. Pt presents with Lt lateral lean when walking. Reports that he always has walked this way.  Stairs: No Wheelchair Mobility Wheelchair Mobility: No    Exercises Total Joint Exercises Long Arc Quad: 10 reps;Both;Seated General Exercises - Lower Extremity Mini-Sqauts:  (instructed but did not perform due to pt not wearing binder.)   PT Goals Acute Rehab PT Goals PT Goal Formulation: With patient Time For Goal Achievement: 04/25/12 Potential to Achieve Goals: Good Pt will Roll Supine to Left Side: with modified independence PT Goal: Rolling Supine to Left Side - Progress: Met Pt will go Supine/Side to Sit: with modified independence;with HOB 0 degrees PT Goal: Supine/Side to Sit - Progress: Met Pt will go Sit to Supine/Side: with modified independence;with HOB 0 degrees PT Goal: Sit to Supine/Side - Progress: Met PT Goal: Sit to Stand - Progress: Met Pt will go Stand to Sit: with supervision;with upper extremity assist PT Goal: Stand to Sit - Progress: Met Pt will Ambulate: 51 - 150 feet;with supervision;with least restrictive assistive device;Other (comment) PT Goal: Ambulate - Progress: Met Pt will Perform Home Exercise Program: with supervision, verbal cues required/provided PT Goal: Perform Home Exercise Program - Progress: Progressing toward goal  Visit Information       Subjective Data  Subjective: I cant cut a flip but I feel pretty good.  Patient Stated Goal: Wants to return home to normal activities (especially gardening)   Cognition  Overall Cognitive Status: Appears within functional limits for tasks assessed/performed Arousal/Alertness: Awake/alert Orientation Level: Appears intact for tasks assessed Behavior During Session: [facility] for tasks performed    Balance  Balance Balance Assessed: No  End of Session PT - End of Session Equipment Utilized During Treatment:  Gait belt Activity  Tolerance: Patient tolerated treatment well Patient left: in chair;with call bell/phone within reach;with family/visitor present Nurse Communication: Mobility status    Camil Wilhelmsen 04/16/2012, 10:55 AM Theron Arista L. Ercell Perlman DPT [phone]

## 2012-04-17 ENCOUNTER — Telehealth: Payer: Self-pay

## 2012-04-17 ENCOUNTER — Encounter (HOSPITAL_COMMUNITY): Payer: Self-pay | Admitting: Physician Assistant

## 2012-04-17 LAB — CBC
Hemoglobin: 10 g/dL — ABNORMAL LOW (ref 13.0–17.0)
MCH: 29.9 pg (ref 26.0–34.0)
MCV: 91.6 fL (ref 78.0–100.0)
Platelets: 359 10*3/uL (ref 150–400)
RBC: 3.35 MIL/uL — ABNORMAL LOW (ref 4.22–5.81)
WBC: 13.8 10*3/uL — ABNORMAL HIGH (ref 4.0–10.5)

## 2012-04-17 LAB — BASIC METABOLIC PANEL
CO2: 28 mEq/L (ref 19–32)
Chloride: 97 mEq/L (ref 96–112)
Glucose, Bld: 102 mg/dL — ABNORMAL HIGH (ref 70–99)
Potassium: 4.3 mEq/L (ref 3.5–5.1)
Sodium: 135 mEq/L (ref 135–145)

## 2012-04-17 MED ORDER — CARVEDILOL 3.125 MG PO TABS: 3.1250 mg | ORAL_TABLET | Freq: Two times a day (BID) | ORAL | Status: DC

## 2012-04-17 MED ORDER — ADULT MULTIVITAMIN W/MINERALS CH: 1.0000 | ORAL_TABLET | Freq: Every day | ORAL | Status: DC

## 2012-04-17 MED ORDER — FUROSEMIDE 40 MG PO TABS: 40.0000 mg | ORAL_TABLET | Freq: Two times a day (BID) | ORAL | Status: DC

## 2012-04-17 MED ORDER — AMIODARONE HCL 200 MG PO TABS: 200.0000 mg | ORAL_TABLET | Freq: Every day | ORAL | Status: DC

## 2012-04-17 MED ORDER — POTASSIUM CHLORIDE CRYS ER 20 MEQ PO TBCR: 20.0000 meq | EXTENDED_RELEASE_TABLET | Freq: Every day | ORAL | Status: DC

## 2012-04-17 MED ORDER — AMIODARONE HCL 200 MG PO TABS
200.0000 mg | ORAL_TABLET | Freq: Every day | ORAL | Status: DC
  Administered 2012-04-17: 200 mg via ORAL
  Filled 2012-04-17: qty 1

## 2012-04-17 NOTE — Discharge Summary (Signed)
Discharge Summary   Patient ID: Martin [name] MRN: [id number], DOB/AGE: August 26, 1946 66 y.o. Admit date: 03/26/2012 D/C date:     04/17/2012   Primary Discharge Diagnoses:  1.Paroxysmal SVT/AF - initiated on amiodarone, will need monitoring of outpatient PFTs/TFTs/LFTs 2. Acute respiratory failure (transiently ventilator-dependent) felt multifactorial in the setting of AF/CHF 3. Acute systolic CHF  - secondary to transient cardiomyopathy with EF 30-35% 03/27/12, improved to 55-60% by echo 04/16/12 5. Hypertensive urgency on admission 6. Cardiogenic shock requiring pressors initially, followed by hemorrhagic shock later in hospitalization 7. Acute renal failure, improving 8. Large spontaneous rectus sheath hematoma 9. Acute blood loss anemia secondary to hematoma 10. Clinical suspicion of OSA 11. Hyperglycemia 12. Hematuria 13. Pleural effusion  Secondary Discharge Diagnoses:  1. Hx of HTN  BRIEF HOSPITAL HISTORY: This is a 66 y/o M with prior EtOH use/HTN who was admitted with SVT/afib/flutter, developed acute VDRF and acute renal failure, shock requiring pressors, & volume overload eventually requiring lasix gtt/titration intermittently held because of ARF. Several days into hospitalization, he developed a rectus sheath hematoma requiring supportive blood products and reversal of anticoagulation. He had subsequent recurrent renal failure that improved by time of discharge, but remains off anticoagulation and off ACEI at this time. EF was noted to be 30-35% early on in hospitalization, with limited echo near end of hospitalization 04/16/12 with EF 55-60%. See below for more comprehensive assessment of his 22-day hospitalization.  Hospital Course:  66 y/o M with hx of HTN, EtOH abuse presented to [facility] with complaints of SOB and cough. He denied any CP, palpitations, dizziness or syncope but he has noticed LE edema for about 4 days. He was noted to be in SVT in the ER and was given  Adenosine 6mg  then 12mg  with no change. He was very hypertensive in the ER 151/127mmHg. He was given 10mg  IV Cardizem with no improvement in HTN or tachcyardia. In route to [facility] he was given NTP and Lasix 40mg  IV due to SOB as well as labetolol 5mg  IV. On arrival at [facility] he was in SVT at 153bpm and 12 lead EKG appeared to be atrial flutter. He noted a hx of prior EtOH but denied any use in a month. BUN/Cr were mildly elevated along with LFTs. CXR demonstrated cardiac enlargement and R lung process possibly combination of effusion and atx/infiltrate. His POC troponin was mildly elevated at 0.34 but remainder of troponins were negative. He was given an additional IV cardizem bolus and started on a gtt, as well as heparin gtt and PRN IV Lopressor. He was also started on IV Lasix for presumed CHF. He was also placed on EtOH withdrawal protocol.   Due to persistently elevated HR/soft blood pressures, he was given IV amiodarone but had persistently low BP. Dopamine was started, IV amiodarone was discontinued, and fluids were given - he was intubated for airway protection and maintenance. He was felt to be in shock, likely cardiogenic vs sepsis and diuresis was held and he was pan-cultured. He was started on empiric broad-spectrum antibiotic therapy and temporary pressors.  2D echo revealed EF of 30-35% with diffuse hypokinesis, moderate MR, PA Pressure 40-29mmHgb. His cardiomyopathy was felt secondary to heavy EtOH. He was off pressors as of 03/28/12 with plans for aggressive diuresis. On 4/4 he had atrial fibrillation at 150bpm, was started on IV amiodarone again. His blood pressure began to rise and therefore med were adjusted. His Cr began to rise as well as his sodium level, requiring  withholding of Lasix. The patient self-extubated himself on 4/6 requiring re-intubation. Heparin was held because of bloody secretions from ET tube and pink-tinged urine on heparin. On 04/01/12 the decision was made to initiate lasix gtt, as  his volume overload was felt the reason for his respiratory failure. The gtt was chosen for steady bloodstream concentration to hopefully have lesser effect on renal function. He was also given Zaroxolyn. His Cr seemed to tolerate the diuresis. Lasix gtt was changed to Lasix IV pushes. He was extubated 04/02/12. By that time his bloody secretions/hematuria had improved and IV heparin was restarted. He was converted to oral amiodarone 04/03/12 and Coumadin was started per pharmacy. Bedside US was checked to assess right pleural space showing only minimal pleural effusion and thoracentesis was deferred in lieu of several more days of IV diuresis. There was high clinical suspicion for OSA so he was started on CPAP but did not tolerate this. OP sleep study was recommended.  Low-dose ACEI was added on 4/11. That same day he had a tight tender abdomen, KUB demonstrated abnormal although nonspecific bowel gas pattern. Subsequent CT was ordered demonstrating a very large focal fluid collection at the right rectus sheath, measuring 17.2 x 15.1 x 7.9 cm with a fluid-fluid level, compatible with an organizing hematoma. The patient's Hgb was noted to have dropped at that time 2gm but was 13.5. He was moved to the unit because of lower blood pressure, increasing size of hematoma and received FFP/Vitamin K. Dr. Lindie Spruce with surgery was asked to consult, who felt that surgery was not the first or second option and that angioembolization was also not likely because of renal insufficiency. An abdominal binder was placed. He remained with soft blood pressures and received blood products including PRBCs to ensure adequate clotting ability. His Hgb ultimately decreased to the 8-9 range. On 4/14 it was noted the rectus sheath had expanded; he had low urine output/hypotension and dopamine was started. Lasix was held and he received additional FFP as well as additional vitamin K. Renal US was obtained demonstrating no significant abnormality.  There was a simple-appearing cyst in the left kidney lower pole. Critical care was reconsulted and central line was placed for assessment of CVP. He was given saline with good urine output and improvement in the hyponatremia he had demonstrated. However his Cr continued to rise quite high and by 4/15 was up to 5.26 (had gotten down into the 2 range several days prior). ACEI was discontinued. Dr. Arrie Aran with renal raised a question of abdominal compartment syndrome. He also recommended changing dopamine to neosynephrine given that patient developed tachycardia into the 130's, hypotension and the patient was thus continued on this for several days, was discontinued by 04/11/12. Dr. Arrie Aran recommended to hold off ACEI until he had completely recovered. The patient was observed over the next few days. A pleural effusion was noted and PCCM advised that they felt this would improve as his general condition improves with no further procedures needed. He was able to be started back on Lasix 4/21 for mild volume overload with good diuresis.   Today the patient is feeling better. He is eager to go home. His Cr has stablized at 1.48. His Hgb is stable at 10. EF by limited echo 4/22 was 55-60%. Dr. Johney Frame has seen and examined him today and feels that he is stable for discharge; he feels that an outpatient myoview may be reasonable once he is clinically improved at home. Dr. Johney Frame recommends amiodarone 200mg  daily  with monitoring of outpatient PFTs/TFTs/LFTs. The patient was instructed to f/u with PCP for hyperglycemia (CBGs 102-120s by day of discharge) as well as the hematuria he had earlier on in his hospitalization to ensure clearing. He is also instructed to contact Dr. Evlyn Courier office to arrange for a sleep study as recommended. I called the [facility] office but Dr. Evlyn Courier nurse was not in and it was felt the best way to arrange this was to have the patient call directly. I discussed his discharge instructions  with him - he verbalized understanding and he did not have any questions about the plan. The patient remains off ACEI due to ARF and off systemic anticoagulation due to his hematoma.   Discharge Vitals: Blood pressure 127/67, pulse 79, temperature 97.6 F (36.4 C), temperature source Oral, resp. rate 19, height 5\' 5"  (1.651 m), weight 200 lb 13.4 oz (91.1 kg), SpO2 99.00%.  Labs: Lab Results  Component Value Date   WBC 13.8* 04/17/2012   HGB 10.0* 04/17/2012   HCT 30.7* 04/17/2012   MCV 91.6 04/17/2012   PLT 359 04/17/2012    Lab 04/17/12 0610  NA 135  K 4.3  CL 97  CO2 28  BUN 30*  CREATININE 1.48*  CALCIUM 8.6  PROT --  BILITOT --  ALKPHOS --  ALT --  AST --  GLUCOSE 102*    Diagnostic Studies/Procedures   1. Ct Abdomen Pelvis Wo Contrast 04/08/2012  **ADDENDUM** CREATED: 04/08/2012 16:21:58  Findings conveyed to the Dr. Gala Romney on 04/08/2012 at to 1620 hours.  **END ADDENDUM** SIGNED BY: Genevive Bi, M.D.  04/08/2012  *RADIOLOGY REPORT*  Clinical Data: rectus sheath hematoma  CT ABDOMEN AND PELVIS WITHOUT CONTRAST  Technique:  Multidetector CT imaging of the abdomen and pelvis was performed following the standard protocol without intravenous contrast.  Comparison: The CT 04/06/2012  Findings: There is interval expansion of the right rectus sheath hematoma which measures 17.5 x 11.6 cm in axial dimension compared to 16.2 x 8.8 cm on prior.  The measures 17.0  in craniocaudad dimension compared to 15 cm on prior.  There is new density within the hematoma consistent with interval hemorrhage.  There is a right pleural effusion with basilar atelectasis similar to prior.  No pericardial fluid.  No focal hepatic lesion.  The gallbladder, pancreas, spleen, adrenal glands, and kidneys are normal.  The stomach, small bowel, and colon are normal.  Abdominal aorta normal caliber.  No retroperitoneal periportal lymphadenopathy.  No free fluid the pelvis.  The prostate gland bladder are normal.  There is a large fat filled left inguinal hernia versus lipoma of the spermatic cord.  No aggressive osseous lesions.  IMPRESSION:  1.  Interval expansion and increased density of large right rectus sheath hematoma consistent containing bleeding into the hematoma.  2.  Stable right pleural effusion.  The findings were conveyed to patient's nurse on 04/08/2012 at 1600 hours.  She conveyed that findings were also conveyed to the [facility] [place] at time of scan. Original Report Authenticated By: Genevive Bi, M.D.   2. Ct Abdomen Pelvis Wo Contrast 04/06/2012  *RADIOLOGY REPORT*  Clinical Data: Abdominal pain and distension; suspected right-sided rectus sheath hematoma.  CT ABDOMEN AND PELVIS WITHOUT CONTRAST  Technique:  Multidetector CT imaging of the abdomen and pelvis was performed following the standard protocol without intravenous contrast.  Comparison: Abdominal radiograph performed 04/05/2012  Findings: A small to moderate right-sided pleural effusion is noted, with partial collapse of the right lower lobe.  Mild left basilar atelectasis is noted.  The liver and spleen are unremarkable in appearance.  The gallbladder is within normal limits.  The pancreas and adrenal glands are unremarkable.  Nonspecific perinephric stranding noted bilaterally.  A small 1.5 cm hypodensity at the lower pole of the left kidney is thought to reflect a cyst.  No renal or ureteral stones are seen.  There is no evidence of hydronephrosis.  There is a very large focal fluid collection at the right rectus sheath, measuring approximately 17.2 x 15.1 x 7.9 cm.  This demonstrates a fluid-fluid level, compatible with an organizing hematoma.  Apparent nodularity about the periphery of collection is thought to reflect clot.  Mild overlying soft tissue inflammation is noted.  On correlation with the abdominal radiograph from 03/31/2012, this finding appears to be new.  Mass is considered to be much less likely, particularly given the  fluid-fluid level.  No free fluid is identified.  The small bowel is unremarkable in appearance.  The stomach is within normal limits.  No acute vascular abnormalities are seen.  Mild scattered calcification is noted along the distal abdominal aorta and its branches.  The appendix is normal in caliber, without evidence for appendicitis.  Scattered diverticulosis is noted along the descending colon, without evidence of diverticulitis.  The colon is otherwise unremarkable in appearance.  A small to moderate left inguinal hernia is noted, containing only fat, with minimal associated soft tissue stranding.  The bladder is mildly distended and grossly unremarkable in appearance.  The prostate is enlarged, measuring 6.0 cm in transverse dimension.  No inguinal lymphadenopathy is seen.  No acute osseous abnormalities are identified.  Anterior bridging osteophytes are noted along the lower thoracic spine.  IMPRESSION:  1.  Very large focal fluid collection at the right rectus sheath, measuring 17.2 x 15.1 x 7.9 cm. This demonstrates a fluid-fluid level, compatible with an organizing hematoma.  Apparent nodularity about the periphery of the collection is thought to reflect.  Mild overlying soft tissue inflammation noted.  As this appears to be new from prior studies, mass is considered to be much less likely. 2.  Small to moderate right-sided pleural effusion, with partial collapse of the right lower lobe; mild left basilar atelectasis noted. 3.  Small to moderate left inguinal hernia, containing only fat, with minimal associated soft tissue stranding. 4.  Scattered diverticulosis along the descending colon, without evidence of diverticulitis. 5.  Mild scattered calcification along the distal abdominal aorta and its branches. 6.  Likely small left renal cyst noted.  These results were called by telephone on 04/06/2012  at  01:52 a.m. to  Dr. Meridee Score, who verbally acknowledged these results.  Original Report  Authenticated By: Tonia Ghent, M.D.   3. Dg Chest 2 View 04/08/2012  *RADIOLOGY REPORT*  Clinical Data: Fever, weakness and shortness of breath.  Right side hematoma.  Right pleural effusion.  CHEST - 2 VIEW  Comparison: Radiographs 04/06/2012 and 04/03/2012. Abdominal CT today.  Findings: A moderate sized right pleural effusion demonstrates free- flowing on the right lateral decubitus views.  There is underlying right basilar atelectasis.  No significant pleural effusion is apparent on the left.  IMPRESSION: Free-flowing moderate sized right pleural effusion.  Original Report Authenticated By: Gerrianne Scale, M.D.   4. Dg Abd 1 View 04/05/2012  *RADIOLOGY REPORT*  Clinical Data: Abdominal pain weakness.  ABDOMEN - 1 VIEW  Comparison: 03/31/2012.  Findings: Gas distended colon and small bowel centered centrally in a nonspecific distribution.  Although this may represent an ileus, obstruction not excluded.  The possibility of free intraperitoneal air cannot be addressed on a supine view.  IMPRESSION: Abnormal although nonspecific bowel gas pattern as detailed above.  Original Report Authenticated By: Fuller Canada, M.D.   5. US Renal Port 04/09/2012  *RADIOLOGY REPORT*  Clinical Data: Renal failure.  RENAL/URINARY TRACT ULTRASOUND COMPLETE  Comparison:  The CT scan of the abdomen dated 04/08/2012  Findings:  Right Kidney:  Normal. 10.0 cm in length.  Left Kidney:  10.3 cm in length.  2 cm simple appearing cyst in the lower pole.  Bladder:  The bladder is empty with a Foley catheter in place.  IMPRESSION: No significant abnormality of the kidneys.  Original Report Authenticated By: Gwynn Burly, M.D.   6. Dg Chest Port 1 View 04/09/2012  *RADIOLOGY REPORT*  Clinical Data: Post left IJ placement  PORTABLE CHEST - 1 VIEW  Comparison: 04/08/2012  Findings: Status post left IJ venous catheter placement with its tip in the upper right atrium, 1 cm below the cavoatrial junction. No pneumothorax.  Moderate  layering right pleural effusion.  Stable cardiomegaly.  IMPRESSION: Status post left IJ venous catheter placement with its tip in the upper right atrium, 1 cm below the cavoatrial junction.  No pneumothorax.  Moderate layering right pleural effusion.  Original Report Authenticated By: Charline Bills, M.D.   7. 2D echo 03/27/12 Study Conclusions - Left ventricle: The cavity size was normal. Wall thickness was increased in a pattern of mild LVH. Systolic function was moderately to severely reduced. The estimated ejection fraction was in the range of 30% to 35%. Diffuse hypokinesis. Indeterminant diastolic function.  - Aortic valve: There was no stenosis. - Mitral valve: Suspect moderate, very eccentric posteriorly-directed mitral regurgitation. - Left atrium: The atrium was moderately dilated. - Right ventricle: The cavity size was mildly dilated. Systolic function was moderately reduced. - Right atrium: The atrium was mildly dilated. - Tricuspid valve: Peak RV-RA gradient: 29mm Hg (S). - Pulmonary arteries: PA systolic pressure 40-44 mmHg. PA diastolic pressure 20-24 mmHg. - Systemic veins: IVC measured 2.3 cm with < 50% respirophasic variation, suggesting RA pressure 11-15 MmHg. Impressions:- The patient was in atrial fibrillation. Normal LV size with mild LV hypertrophy. EF 30-35% with moderate diffuse hypokinesis. Mildly dilated RV with moderate systolic dysfunction. Moderate eccentric mitral regurgitation. Mild pulmonary hypertension.  8. 2D Echo 04/16/12 Study Conclusions - Left ventricle: The cavity size was normal. Systolic function was normal. The estimated ejection fraction was in the range of 55% to 60%. - Left atrium: The atrium was moderately to severely dilated. Impressions: - Limited exam  Discharge Medications   Medication List  As of 04/17/2012  9:48 AM   TAKE these medications         amiodarone 200 MG tablet   Commonly known as: PACERONE   Take 1 tablet (200 mg total) by  mouth daily.      carvedilol 3.125 MG tablet   Commonly known as: COREG   Take 1 tablet (3.125 mg total) by mouth 2 (two) times daily with a meal.      furosemide 40 MG tablet   Commonly known as: LASIX   Take 1 tablet (40 mg total) by mouth 2 (two) times daily.      mulitivitamin with minerals Tabs   Take 1 tablet by mouth daily.      potassium chloride SA 20 MEQ tablet   Commonly known as: K-DUR,KLOR-CON   Take 1  tablet (20 mEq total) by mouth daily.            Disposition   The patient will be discharged in stable condition to home. Discharge Orders    Future Appointments: Provider: Department: Dept Phone: Center:   04/23/2012 11:20 AM Jodelle Gross, NP [facility] [phone] [id number]     Future Orders Please Complete By Expires   Diet - low sodium heart healthy      Increase activity slowly      Comments:   No driving until cleared by your cardiologist. No heavy lifting >10lbs until cleared by your doctor. No sexual activity for 2 weeks. You may not return to work until cleared by your cardiologist.    Discharge instructions      Comments:   Patients on amiodarone may require additional periodic monitoring of other organ systems, such as lungs, thyroid, eyes, and liver function. Please talk to your doctor at your follow-up appointments about what monitoring may be necessary for you.  Discontinue alcohol use.  Please contact Dr. Evlyn Courier office to arrange for an outpatient sleep study to look for signs of sleep apnea.  You need to follow up with your primary doctor for the following: - Your blood sugar was slightly elevated during your hospitalization, and will need to be followed by your primary care doctor to rule out progression to diabetes. - You had blood in your urine this admission which may have been due to the blood thinners, but we would recommend a recheck at your primary care doctor's office to ensure that it has cleared.     Follow-up  Information    Follow up with SOOD,VINEET, MD. (Please contact Dr. Evlyn Courier office to schedule an outpatient sleep study)    Contact information:   [address] [employer] [place] [address] [phone]       Follow up with Joni Reining, NP. (04/23/12 at 11am)    Contact information:   [facility] [address] [place], [place] [phone]       Follow up with Your Primary Medical Doctor. (Please schedule a follow-up appointment (see discharge instructions section))            Duration of Discharge Encounter: Greater than 30 minutes including physician and PA time.  Signed, Ronie Spies PA-C 04/17/2012, 9:48 AM   I have seen, examined the patient, and reviewed the above assessment and plan.   Co Sign: Hillis Range, MD 04/17/2012 9:10 PM

## 2012-04-17 NOTE — Telephone Encounter (Signed)
Patient returning call.

## 2012-04-17 NOTE — Telephone Encounter (Signed)
Returning call can be reached at [phone].Martin [name]

## 2012-04-17 NOTE — Telephone Encounter (Signed)
[id number] to discuss with patient. Carron Curie, CMA

## 2012-04-17 NOTE — Telephone Encounter (Signed)
It appears that a HFU was set for 5/8 w/ VS & then was cancelled per Katy/VS.  I attempted to make a HFU, but Annabelle Harman stated to just have someone contact the patient about the sleep study.  Antionette Fairy

## 2012-04-17 NOTE — Telephone Encounter (Signed)
Martin Harman, NP for cardiology called stating that according to pt discharge instructions he needs a sleep study ordered. Pt is not scheduled for a HFU at this time. Below is a hospital note from Dr. Craige [name]. Dr. Craige Cotta do we need to order the sleep study on this patient or does he need a HFU? Please advise. Martin [name], CMA   Reviewed above, examined pt, and agree with assessment/plan.  He was not able to tolerate auto CPAP last night, and is reluctant to use again unless more definite dx of OSA is established. Will d/c CPAP for now. Recommend he have arrangement for sleep study as outpt to further assess for sleep disordered breathing. If he is found to have OSA, then referral back to pulmonary can be made.  Otherwise, no additional pulmonary inpatient interventions needed at this time. PCCM will sign off. Please call if additional help needed.  Martin Helling, MD  04/05/2012, 10:07 AM  Pager: [phone]

## 2012-04-17 NOTE — Progress Notes (Signed)
   CARE MANAGEMENT NOTE 04/17/2012  Patient:  Martin [name], Martin [name]   Account Number:  [id number]  Date Initiated:  03/28/2012  Documentation initiated by:  GRAVES-BIGELOW,BRENDA  Subjective/Objective Assessment:   Pt admitted with CHF/SVT. 03/27/12 pt had urgent intubation and treatment of hypotension/resp distress. Pt on vancomycin and zosyn IV-off levophed and dopamine.     Action/Plan:   discharge planning   Anticipated DC Date:  04/03/2012   Anticipated DC Plan:  HOME W HOME HEALTH SERVICES      DC Planning Services  CM consult      Choice offered to / List presented to:  C-1 Patient        HH arranged  HH - 11 Patient Refused      Status of service:  Completed, signed off Medicare Important Message given?  NO (If response is "NO", the following Medicare IM given date fields will be blank) Date Medicare IM given:   Date Additional Medicare IM given:    Discharge Disposition:  HOME/SELF CARE  Per UR Regulation:  Reviewed for med. necessity/level of care/duration of stay  If discussed at Long Length of Stay Meetings, dates discussed:   04/04/2012  04/11/2012    Comments:  04/17/12 1030 Spoke with pt. and spouse again about [facility] services.  Pt. did not want HH RN at this time, as he states his spouse takes good care of him.  Pt. is to discharge home today. Tera Mater, RN, BSN Nurse Case Manager [phone]   04/12/12 1400 Spoke with pt. and spouse about [facility] services. Pt. sitting up in chair off IV medications.  PT is recommending HH PT/OT depending on his progress until discharge.  Gave pt. a list of agencies and will follow for choice. Tera Mater, RN, BSN Nurse Case Manager [phone]   03-28-12 [address], [place] [phone] CM will continue to monitor for d/c disposition needs.

## 2012-04-17 NOTE — Progress Notes (Signed)
SUBJECTIVE:    Feeling better. No CP/SOB. Having regular BMs. Wants to go home today.  OBJECTIVE:   Vitals:   Filed Vitals:   04/16/12 1020 04/16/12 1510 04/16/12 2120 04/17/12 0638  BP: 130/80 118/73 127/79 127/67  Pulse: 80 81 80 79  Temp:  97.6 F (36.4 C) 98 F (36.7 C) 97.6 F (36.4 C)  TempSrc:  Oral Oral Oral  Resp:  18 19 19   Height:      Weight:      SpO2:  97% 99% 99%   I&O's:    Intake/Output Summary (Last 24 hours) at 04/17/12 0759 Last data filed at 04/17/12 0746  Gross per 24 hour  Intake    730 ml  Output   1925 ml  Net  -1195 ml   TELEMETRY: Reviewed telemetry pt in NS   PHYSICAL EXAM General: Well developed, well nourished, in no acute distress Head: Normal, OP clear Lungs:   Clear bilaterally to auscultation and percussion. Heart:   HRRR S1 S2 Pulses are 2+ & equal. Abdomen:  Distended. Firm but non-tender. Good BS Extremities:   No clubbing, cyanosis. 2+ edema.  DP +1 Neuro: Alert and oriented X 3. Psych:  Good affect, responds appropriately   LABS: Basic Metabolic Panel:  Basename 04/17/12 0610 04/16/12 0522  NA 135 131*  K 4.3 4.0  CL 97 94*  CO2 28 26  GLUCOSE 102* 124*  BUN 30* 27*  CREATININE 1.48* 1.48*  CALCIUM 8.6 8.7  MG -- --  PHOS -- --   Liver Function Tests: No results found for this basename: AST:2,ALT:2,ALKPHOS:2,BILITOT:2,PROT:2,ALBUMIN:2 in the last 72 hours No results found for this basename: LIPASE:2,AMYLASE:2 in the last 72 hours CBC:  Basename 04/17/12 0610 04/15/12 0625  WBC 13.8* 11.7*  NEUTROABS -- --  HGB 10.0* 9.6*  HCT 30.7* 29.9*  MCV 91.6 90.6  PLT 359 369    Coag Panel:   Lab Results  Component Value Date   INR 1.09 04/12/2012   INR 1.08 04/11/2012   INR 1.35 04/10/2012     ASSESSMENT:  1.  Rectus sheath hematoma - much improved 2.  Acute on chronic renal failure -  Improved. Cr now stable about 1.5 3.  Acute systolic HF      - EF 30-35%  By echo 4/2 with global HK- (no known h/o CAD)    Tolerating PO lasix and low-dose b-blocker. No ACE-I/ARB due to renal failure.   Place TED hose.  Would optimize medications and repeat echo in 4-6 weeks Outpatient myoview may be reasonable once he is clinically improved at home 4. PAF maintaining NSR on amio Decrease to 200mg  daily today.  Will need outpatient LFTs/TFTs/PFTs         -Off systemic anticoagulation due to rectus sheath hematoma 5.  H/o ETOH abuse- the importance of complete cessation was discussed at length today. 6. VDRF- Outpt sleep study per Dr Craige Cotta   DC to home today Needs close follow-up with Norma Fredrickson and Dr Daleen Squibb as outpatient.

## 2012-04-17 NOTE — Telephone Encounter (Signed)
Patient had declined CPAP therapy while in hospital, and stated he was not interested in CPAP therapy as an outpt.  If this has changed and he is not interested in therapy for possible OSA, then he can have sleep study arranged through his primary physician or cardiology.  He does not need a hospital follow up visit with pulmonary for this.  If his sleep study is positive for sleep apnea, then he can be referred back to pulmonary if needed.

## 2012-04-17 NOTE — Progress Notes (Signed)
All d/c instructions explained and given to pt and spouse.  Verbalized understanding.  Pt refused d/c instructions from work.  States I don't longer work.  Amanda Pea, RN

## 2012-04-17 NOTE — Telephone Encounter (Signed)
Pt aware and he states that he does not want treatment for OSa and does not want sleep study done. I advised that if he changes his mind to let his PCP know.Carron Curie, CMA

## 2012-04-17 NOTE — Telephone Encounter (Signed)
[email or web address] Cybulski, CMA  

## 2012-04-20 ENCOUNTER — Encounter: Payer: Self-pay | Admitting: Adult Health

## 2012-04-23 ENCOUNTER — Other Ambulatory Visit: Payer: Self-pay

## 2012-04-23 ENCOUNTER — Other Ambulatory Visit: Payer: Self-pay | Admitting: Adult Health

## 2012-04-23 ENCOUNTER — Encounter: Payer: Self-pay | Admitting: Adult Health

## 2012-04-23 ENCOUNTER — Ambulatory Visit (INDEPENDENT_AMBULATORY_CARE_PROVIDER_SITE_OTHER): Payer: Self-pay | Admitting: Adult Health

## 2012-04-23 VITALS — BP 169/101 | HR 99 | Resp 18 | Ht 65.0 in | Wt 200.0 lb

## 2012-04-23 DIAGNOSIS — I5022 Chronic systolic (congestive) heart failure: Secondary | ICD-10-CM

## 2012-04-23 DIAGNOSIS — I10 Essential (primary) hypertension: Secondary | ICD-10-CM

## 2012-04-23 DIAGNOSIS — I5023 Acute on chronic systolic (congestive) heart failure: Secondary | ICD-10-CM

## 2012-04-23 DIAGNOSIS — I4891 Unspecified atrial fibrillation: Secondary | ICD-10-CM

## 2012-04-23 DIAGNOSIS — N179 Acute kidney failure, unspecified: Secondary | ICD-10-CM

## 2012-04-23 LAB — BASIC METABOLIC PANEL
CO2: 31 mEq/L (ref 19–32)
Calcium: 9.5 mg/dL (ref 8.4–10.5)
Glucose, Bld: 96 mg/dL (ref 70–99)
Potassium: 5.1 mEq/L (ref 3.5–5.3)
Sodium: 139 mEq/L (ref 135–145)

## 2012-04-23 LAB — HEPATIC FUNCTION PANEL
AST: 25 U/L (ref 0–37)
Bilirubin, Direct: 0.2 mg/dL (ref 0.0–0.3)
Total Bilirubin: 0.7 mg/dL (ref 0.3–1.2)

## 2012-04-23 MED ORDER — CARVEDILOL 6.25 MG PO TABS: 6.2500 mg | ORAL_TABLET | Freq: Two times a day (BID) | ORAL | Status: DC

## 2012-04-23 MED ORDER — HYDROXYZINE HCL 25 MG PO TABS: 25.0000 mg | ORAL_TABLET | Freq: Three times a day (TID) | ORAL | Status: AC | PRN

## 2012-04-23 NOTE — Patient Instructions (Addendum)
**Note De-Identified Azeez Dunker Obfuscation** Your physician has recommended you make the following change in your medication: stop taking Amiodarone... Call this office at [phone] in 1  week to report if symptoms have improved or not. Increase Coreg (Carvedilol) to 6.25 mg twice daily and start taking Atarax 25 mg three times daily for 3 days then use as needed.  Your physician recommends that you return for lab work in: today  Your physician recommends that you schedule a follow-up appointment in: 1 month

## 2012-04-23 NOTE — Assessment & Plan Note (Signed)
There is no clinical evidence of fluid retention at this time. Lungs are clear. Recent EF of 55%-60%. BP is elevated presently but he has not taken BP medications today. I have advised him to take them everyday at the same time, regardless if he has appointments or not. He verbalizes understanding.

## 2012-04-23 NOTE — Assessment & Plan Note (Signed)
Checking BMET today 

## 2012-04-23 NOTE — Assessment & Plan Note (Signed)
Heart rate is well controlled on current mediations. I am concerned, however, about rash that has begun since discharge. I do not see that he had this during hospitalization. Amiodarone is at therapeutic regimen after loading. I have spoken with Dr. Johney Frame about possibly stopping the amiodarone to ascertain if this is causing the rash.  He recommends stopping for one week, to see if it improves and increase his coreg to 6.25 mg BID. He will follow-up in the office in one week for recheck of his status.  In the interim, I will start him on atarax 25 mg TID for 3 days and then prn only to assist with the itching. I will check a BMET, LFT's and TSH.   He is advised not to return to ETOH.  He verbalizes understanding.

## 2012-04-23 NOTE — Progress Notes (Signed)
HPI: [name] is a complicated 66 y/o patietn of Dr. Johney Frame we are following for post-hospital office visit after lengthy and complicated stay at [facility] for PAF,SVT, Acute CHF, hypertensive urgency, and VDRF.  He was also found to have a rectal sheath hematoma with acute blood loss anemia, requiring supportive blood products and reversal of anticoagulation.  EF per echo initially 30-35%, (in atrial fib) but by discharge had increased to 55-60% (in NSR). He was diuresed and remained on po lasix. He was converted to NSR on amiodarone loading. He remains on amiodarone 200 mg daily as well as coreg 3.125mg  BID, and lasix 40 mg daily.   He comes today with complaints oc a pruritic rash on his legs, arms, back and upper chest. He states this began a few days after returning home. He has not had this rash before and thinks it may be from one of his medications.  He is not drinking ETOH since discharge. Wt is 200 lbs, same as on discharge. He is otherwise without complaints of chest pain, DOE, or edema. Still has some soreness in his abdomen from rectal sheath hematoma. He say his stomach is much smaller than during hospitailzation.   No Known Allergies  Current Outpatient Prescriptions  Medication Sig Dispense Refill  . carvedilol (COREG) 6.25 MG tablet Take 1 tablet (6.25 mg total) by mouth 2 (two) times daily with a meal.  60 tablet  6  . furosemide (LASIX) 40 MG tablet Take 1 tablet (40 mg total) by mouth 2 (two) times daily.  60 tablet  6  . Multiple Vitamin (MULITIVITAMIN WITH MINERALS) TABS Take 1 tablet by mouth daily.      . potassium chloride SA (K-DUR,KLOR-CON) 20 MEQ tablet Take 1 tablet (20 mEq total) by mouth daily.  30 tablet  6  . DISCONTD: carvedilol (COREG) 3.125 MG tablet Take 1 tablet (3.125 mg total) by mouth 2 (two) times daily with a meal.  60 tablet  6  . hydrOXYzine (ATARAX/VISTARIL) 25 MG tablet Take 1 tablet (25 mg total) by mouth 3 (three) times daily as needed for itching.   90 tablet  0    Past Medical History  Diagnosis Date  . Hypertension   . Atrial fibrillation or flutter     Recognized paroxysmal SVT/AFlutter/Afib 03/2011. Not on anticoagulation due to large rectus sheath hematoma during that admission.  . CHF (congestive heart failure)     Transient cardiomyopathy with EF 30-35% 03/27/12, improved to 55-60% 04/16/12  . Acute renal failure     03/2012 (not on ACEI due to this) - renal US unremarkable  . Rectus sheath hematoma     Spontaneous during 03/2012 hospitalization managed conservatively with blood products  . Respiratory failure     VDRF 03/2012 in setting of CHF  . Acute blood loss anemia     Due to rectus sheath hematoma 03/2012  . Unspecified sleep apnea     Suspected during 03/2012 hospitalization - pending formal sleep study  . Hematuria 03/2012  . Hyperglycemia 03/2012  . Shock     03/2012 hospitalization - Cardiogenic shock requiring pressors initially, followed by hemorrhagic shock later in hospitalization  . Pleural effusion 03/2012    No past surgical history on file.  ZOX:WRUEAV of systems complete and found to be negative unless listed above PHYSICAL EXAM BP 169/101  Pulse 99  Resp 18  Ht 5\' 5"  (1.651 m)  Wt 200 lb (90.719 kg)  BMI 33.28 kg/m2  General: Well developed,  well nourished, in no acute distress Head: Eyes PERRLA, No xanthomas.   Normal cephalic and atraumatic Lungs: Clear bilaterally to auscultation and percussion. Heart: HRRR S1 S2, without MRG.  Pulses are 2+ & equal.     No carotid bruit. No JVD.  No abdominal bruits. No femoral bruits. Abdomen: Bowel sounds are positive, abdomen tight andmild tenderness on the right side, normal bowell sounds Msk:  Back normal, normal gait. Normal strength and tone for age.Rashed macular red rash, no drainage or signs of infection. Located on bilateral legs, upper chest, back and arms.  Extremities: No clubbing, cyanosis or edema.  DP +1 Neuro: Alert and oriented X 3. Psych:   Good affect, responds appropriately  EKG: NSR 88 bpm  ASSESSMENT AND PLAN

## 2012-04-27 ENCOUNTER — Telehealth: Payer: Self-pay | Admitting: Cardiology

## 2012-04-27 NOTE — Telephone Encounter (Signed)
Patient states that he is reaction to medicine Carvedilol.  States that it is making his side hurt. / tg

## 2012-04-27 NOTE — Telephone Encounter (Signed)
After speaking with patient, realized that the issue was a question of how much Carvedilol he is actually taking.  State he is taking pills out of two bottles, which is double the prescribed dose of 6.25 mg bid.  Advised him to take medication equal to 6.25 mg bid only.  As for his side hurting, advised him to talk to PCP regarding this.  He reports that rash from last visit has subsided since stopping amiodarone, as instructed.

## 2012-05-01 NOTE — Telephone Encounter (Signed)
Patient has follow up with Dr Diona Browner on 5/30

## 2012-05-01 NOTE — Telephone Encounter (Signed)
He is supposed to have a follow-up appointment to check his status after last appointment to stop amiodarone.. Please make sure he does.

## 2012-05-02 ENCOUNTER — Inpatient Hospital Stay: Payer: Self-pay | Admitting: Pulmonary Disease

## 2012-05-23 ENCOUNTER — Ambulatory Visit: Payer: Self-pay | Admitting: Adult Health

## 2012-05-23 ENCOUNTER — Ambulatory Visit: Payer: Self-pay | Admitting: Cardiology

## 2012-05-24 ENCOUNTER — Encounter: Payer: Self-pay | Admitting: Cardiology

## 2012-05-24 ENCOUNTER — Ambulatory Visit (INDEPENDENT_AMBULATORY_CARE_PROVIDER_SITE_OTHER): Payer: Self-pay | Admitting: Cardiology

## 2012-05-24 VITALS — BP 130/76 | HR 72 | Resp 16 | Ht 65.0 in | Wt 197.0 lb

## 2012-05-24 DIAGNOSIS — I429 Cardiomyopathy, unspecified: Secondary | ICD-10-CM | POA: Insufficient documentation

## 2012-05-24 DIAGNOSIS — I4891 Unspecified atrial fibrillation: Secondary | ICD-10-CM

## 2012-05-24 DIAGNOSIS — R21 Rash and other nonspecific skin eruption: Secondary | ICD-10-CM | POA: Insufficient documentation

## 2012-05-24 DIAGNOSIS — S301XXA Contusion of abdominal wall, initial encounter: Secondary | ICD-10-CM

## 2012-05-24 DIAGNOSIS — I5021 Acute systolic (congestive) heart failure: Secondary | ICD-10-CM | POA: Insufficient documentation

## 2012-05-24 DIAGNOSIS — N183 Chronic kidney disease, stage 3 unspecified: Secondary | ICD-10-CM

## 2012-05-24 NOTE — Assessment & Plan Note (Signed)
Normalization of LV function by most recent echocardiogram with LVEF of 55-60% in April.

## 2012-05-24 NOTE — Progress Notes (Signed)
Clinical Summary Mr. Samson is a medically complex 66 y.o.male presenting for office followup. This is my first meeting with him. He saw Ms. Lawrence in April, has had no followup with Dr. Johney Frame. He has no primary care physician. History is summarized below.  He was having problems with a rash at last visit, amiodarone was discontinued at that time. Patient reports complete resolution. Medications are reviewed below. He reports compliance. Also states he has been feeling quite well.  No complaint of palpitations or chest pain. No syncope. He states he has not been drinking alcohol.   No Known Allergies  Current Outpatient Prescriptions  Medication Sig Dispense Refill  . carvedilol (COREG) 6.25 MG tablet Take 1 tablet (6.25 mg total) by mouth 2 (two) times daily with a meal.  60 tablet  1  . furosemide (LASIX) 40 MG tablet Take 1 tablet (40 mg total) by mouth 2 (two) times daily.  60 tablet  6  . potassium chloride SA (K-DUR,KLOR-CON) 20 MEQ tablet Take 1 tablet (20 mEq total) by mouth daily.  30 tablet  6    Past Medical History  Diagnosis Date  . Hypertension   . Atrial fibrillation or flutter   . Cardiomyopathy     Transient cardiomyopathy with EF 30-35% 03/27/12, improved to 55-60% 04/16/12  . Acute renal failure     03/2012 (not on ACEI due to this) - renal US unremarkable  . Rectus sheath hematoma     Spontaneous during 03/2012 hospitalization managed conservatively with blood products  . Respiratory failure     VDRF 03/2012 in setting of CHF  . Acute blood loss anemia     Due to rectus sheath hematoma 03/2012  . Unspecified sleep apnea     Suspected during 03/2012 hospitalization - pending formal sleep study  . Hematuria 03/2012  . Hyperglycemia 03/2012  . Shock     03/2012 hospitalization - Cardiogenic shock requiring pressors initially, followed by hemorrhagic shock later in hospitalization  . Pleural effusion 03/2012    Social History Mr. Beckford reports that he has  never smoked. He does not have any smokeless tobacco history on file. Mr. Bilotti reports that he drinks alcohol.  Review of Systems No regular exercise, no orthopnea or PND. He reports no spontaneous bleeding, no abnormal pain. Otherwise negative.  Physical Examination Filed Vitals:   05/24/12 1511  BP: 130/76  Pulse: 72  Resp: 16   Obese male in no acute distress. HEENT: Conjunctiva and lids normal, oropharynx clear with poor dentition. Neck: Supple, no elevated JVP, no thyromegaly. Lungs: Diminished breath sounds particularly at the bases, nonlabored breathing at rest. Cardiac: Regular rate and rhythm, no S3, soft systolic murmur, no pericardial rub. Abdomen: Protuberant, bowel sounds present, no guarding or rebound. Extremities: Trace edema, distal pulses 1+. Skin: Dry, flaking. Musculoskeletal: No kyphosis. Neuropsychiatric: Alert and oriented x3, affect grossly appropriate.   ECG Normal sinus rhythm, high lateral Q waves.   Problem List and Plan   Atrial fibrillation Resolved, he is in sinus rhythm today and was also at the last visit. Amiodarone was discontinued, and he will stay off of this medication for now. He is not being anticoagulated with history of large rectus sheath hematoma and hemorrhagic shock.  Secondary cardiomyopathy Normalization of LV function by most recent echocardiogram with LVEF of 55-60% in April.  Rectus sheath hematoma Occurred on anticoagulation, now resolved.  Rash Resolved off amiodarone.  CKD (chronic kidney disease), stage III Last BUN and creatinine were 35  and 1.6 respectively. Followup BMET.     Jonelle Sidle, M.D., F.A.C.C.

## 2012-05-24 NOTE — Patient Instructions (Signed)
**Note De-identified Anaija Wissink Obfuscation** Your physician recommends that you continue on your current medications as directed. Please refer to the Current Medication list given to you today.  Your physician recommends that you return for lab work in: TODAY  Your physician recommends that you schedule a follow-up appointment in: 3 months   

## 2012-05-24 NOTE — Assessment & Plan Note (Signed)
Resolved off amiodarone.

## 2012-05-24 NOTE — Assessment & Plan Note (Signed)
Last BUN and creatinine were 35 and 1.6 respectively. Followup BMET.

## 2012-05-24 NOTE — Assessment & Plan Note (Signed)
Occurred on anticoagulation, now resolved.

## 2012-05-24 NOTE — Assessment & Plan Note (Signed)
Resolved, he is in sinus rhythm today and was also at the last visit. Amiodarone was discontinued, and he will stay off of this medication for now. He is not being anticoagulated with history of large rectus sheath hematoma and hemorrhagic shock.

## 2012-05-25 LAB — BASIC METABOLIC PANEL
BUN: 17 mg/dL (ref 6–23)
CO2: 25 mEq/L (ref 19–32)
Calcium: 9 mg/dL (ref 8.4–10.5)
Creat: 1.28 mg/dL (ref 0.50–1.35)
Glucose, Bld: 92 mg/dL (ref 70–99)

## 2012-07-03 ENCOUNTER — Other Ambulatory Visit: Payer: Self-pay | Admitting: Adult Health

## 2012-08-17 ENCOUNTER — Other Ambulatory Visit: Payer: Self-pay | Admitting: *Deleted

## 2012-08-17 MED ORDER — POTASSIUM CHLORIDE CRYS ER 20 MEQ PO TBCR: 20.0000 meq | EXTENDED_RELEASE_TABLET | Freq: Every day | ORAL | Status: DC

## 2012-08-24 ENCOUNTER — Ambulatory Visit (INDEPENDENT_AMBULATORY_CARE_PROVIDER_SITE_OTHER): Payer: Medicaid Other | Admitting: Cardiology

## 2012-08-24 ENCOUNTER — Encounter: Payer: Self-pay | Admitting: Cardiology

## 2012-08-24 ENCOUNTER — Encounter: Payer: Self-pay | Admitting: *Deleted

## 2012-08-24 VITALS — BP 117/74 | HR 75 | Ht 65.0 in | Wt 201.0 lb

## 2012-08-24 DIAGNOSIS — I429 Cardiomyopathy, unspecified: Secondary | ICD-10-CM

## 2012-08-24 DIAGNOSIS — I4891 Unspecified atrial fibrillation: Secondary | ICD-10-CM

## 2012-08-24 DIAGNOSIS — I428 Other cardiomyopathies: Secondary | ICD-10-CM

## 2012-08-24 NOTE — Progress Notes (Signed)
   Clinical Summary Mr. Honse is a medically complex 66 y.o.male presenting for followup. He was seen in May. He continues to do fairly well. States that he has been fishing most days of the week, walks a few miles when he does this. No chest pain, palpitations, or progressive dyspnea.  Followup lab work in May showed potassium 3.7, BUN 17, creatinine 1.2, improved overall.  He reports compliance with his medications.   No Known Allergies  Current Outpatient Prescriptions  Medication Sig Dispense Refill  . carvedilol (COREG) 6.25 MG tablet TAKE ONE TABLET BY MOUTH TWICE DAILY WITH MEALS  60 tablet  3  . furosemide (LASIX) 40 MG tablet Take 1 tablet (40 mg total) by mouth 2 (two) times daily.  60 tablet  6  . potassium chloride SA (K-DUR,KLOR-CON) 20 MEQ tablet Take 1 tablet (20 mEq total) by mouth daily.  30 tablet  6    Past Medical History  Diagnosis Date  . Essential hypertension, benign   . Atrial fibrillation   . Cardiomyopathy     Transient cardiomyopathy with EF 30-35% 03/27/12, improved to 55-60% 04/16/12  . Acute renal failure     03/2012 (not on ACEI due to this) - renal US unremarkable  . Rectus sheath hematoma     Spontaneous during 03/2012 hospitalization managed conservatively with blood products  . Respiratory failure     VDRF 03/2012 in setting of CHF  . Acute blood loss anemia     Due to rectus sheath hematoma 03/2012  . Unspecified sleep apnea     Suspected during 03/2012 hospitalization   . Hematuria 03/2012  . Hyperglycemia 03/2012  . Shock     03/2012 hospitalization - Cardiogenic shock requiring pressors initially, followed by hemorrhagic shock later in hospitalization  . Pleural effusion 03/2012    Social History Mr. Harnish reports that he has never smoked. He does not have any smokeless tobacco history on file. Mr. Velis reports that he drinks alcohol.  Review of Systems Good appetite, no abdominal pain. No bleeding episodes.  Physical  Examination Filed Vitals:   08/24/12 0830  BP: 117/74  Pulse: 75   Obese male in no acute distress.  HEENT: Conjunctiva and lids normal, oropharynx clear with poor dentition.  Neck: Supple, no elevated JVP, no thyromegaly.  Lungs: Diminished breath sounds particularly at the bases, nonlabored breathing at rest.  Cardiac: Regular rate and rhythm, no S3, soft systolic murmur, no pericardial rub.  Abdomen: Protuberant, bowel sounds present, no guarding or rebound.  Extremities: Trace edema, distal pulses 1+.    Problem List and Plan   Secondary cardiomyopathy LV function has normalized by last assessment. He is symtomatically stable with good blood pressure. No ACE-I with prior ARF. Continue Coreg, Lasix, KCL. Followup arranged.  Atrial fibrillation Resolved. Regular heart rate on exam. Continue observation for now. He is not being anticoagulated with history of large rectus sheath hematoma and hemorrhagic shock.    CKD (chronic kidney disease), stage III Renal function improved by last BMET - repeat for next visit in three months.    Jonelle Sidle, M.D., F.A.C.C.

## 2012-08-24 NOTE — Assessment & Plan Note (Signed)
Renal function improved by last BMET - repeat for next visit in three months.

## 2012-08-24 NOTE — Assessment & Plan Note (Signed)
Resolved. Regular heart rate on exam. Continue observation for now. He is not being anticoagulated with history of large rectus sheath hematoma and hemorrhagic shock.

## 2012-08-24 NOTE — Assessment & Plan Note (Signed)
LV function has normalized by last assessment. He is symtomatically stable with good blood pressure. No ACE-I with prior ARF. Continue Coreg, Lasix, KCL. Followup arranged.

## 2012-08-24 NOTE — Patient Instructions (Signed)
Your physician recommends that you schedule a follow-up appointment in: 3 month follow up  Your physician recommends that you return for lab work in: Lab work in 3 months, prior to your follow up visit

## 2012-11-09 ENCOUNTER — Other Ambulatory Visit: Payer: Self-pay | Admitting: *Deleted

## 2012-11-09 DIAGNOSIS — I429 Cardiomyopathy, unspecified: Secondary | ICD-10-CM

## 2012-11-21 ENCOUNTER — Other Ambulatory Visit: Payer: Self-pay | Admitting: *Deleted

## 2012-11-21 MED ORDER — CARVEDILOL 6.25 MG PO TABS: 6.2500 mg | ORAL_TABLET | Freq: Two times a day (BID) | ORAL | Status: DC

## 2012-11-28 ENCOUNTER — Encounter: Payer: Self-pay | Admitting: *Deleted

## 2012-11-28 ENCOUNTER — Other Ambulatory Visit: Payer: Self-pay | Admitting: *Deleted

## 2012-11-28 DIAGNOSIS — I10 Essential (primary) hypertension: Secondary | ICD-10-CM

## 2012-11-30 ENCOUNTER — Other Ambulatory Visit: Payer: Self-pay | Admitting: Cardiology

## 2012-12-01 LAB — BASIC METABOLIC PANEL
Calcium: 9.7 mg/dL (ref 8.4–10.5)
Creat: 1.25 mg/dL (ref 0.50–1.35)

## 2012-12-03 ENCOUNTER — Ambulatory Visit (INDEPENDENT_AMBULATORY_CARE_PROVIDER_SITE_OTHER): Payer: Medicaid Other | Admitting: Cardiology

## 2012-12-03 ENCOUNTER — Encounter: Payer: Self-pay | Admitting: Cardiology

## 2012-12-03 ENCOUNTER — Encounter: Payer: Self-pay | Admitting: *Deleted

## 2012-12-03 VITALS — BP 130/70 | HR 83 | Ht 65.0 in | Wt 207.0 lb

## 2012-12-03 DIAGNOSIS — I429 Cardiomyopathy, unspecified: Secondary | ICD-10-CM

## 2012-12-03 DIAGNOSIS — I4891 Unspecified atrial fibrillation: Secondary | ICD-10-CM

## 2012-12-03 NOTE — Assessment & Plan Note (Signed)
Normalization of LV function as of April. Medical regimen is fairly minimal, not on ACE inhibitor with history of acute renal failure, but has been able to tolerate Coreg and Lasix. No change in current regimen.

## 2012-12-03 NOTE — Progress Notes (Signed)
   Clinical Summary Martin [name] is a medically complex 66 y.o.male presenting for followup. He was seen in August. He has been stable symptomatically, NYHA class II dyspnea, no palpitations or chest pain. Weight is up from the last visit, although he states that he "eats too much." He has not had any orthopnea, has no peripheral edema. We discussed diet and increasing his walking.  Recent lab work reviewed showing potassium 4.7, BUN 20, creatinine 1.2 which was stable. ECG today shows sinus rhythm with PVCs NSST changes.   No Known Allergies  Current Outpatient Prescriptions  Medication Sig Dispense Refill  . carvedilol (COREG) 6.25 MG tablet Take 1 tablet (6.25 mg total) by mouth 2 (two) times daily with a meal.  60 tablet  6  . furosemide (LASIX) 40 MG tablet Take 1 tablet (40 mg total) by mouth 2 (two) times daily.  60 tablet  6    Past Medical History  Diagnosis Date  . Essential hypertension, benign   . Atrial fibrillation   . Cardiomyopathy     Transient cardiomyopathy with EF 30-35% 03/27/12, improved to 55-60% 04/16/12  . Acute renal failure     03/2012 (not on ACEI due to this) - renal US unremarkable  . Rectus sheath hematoma     Spontaneous during 03/2012 hospitalization managed conservatively with blood products  . Respiratory failure     VDRF 03/2012 in setting of CHF  . Acute blood loss anemia     Due to rectus sheath hematoma 03/2012  . Unspecified sleep apnea     Suspected during 03/2012 hospitalization   . Hematuria 03/2012  . Hyperglycemia 03/2012  . Shock     03/2012 hospitalization - Cardiogenic shock requiring pressors initially, followed by hemorrhagic shock later in hospitalization  . Pleural effusion 03/2012    Social History Martin [name] reports that he has never smoked. He does not have any smokeless tobacco history on file. Martin [name] reports that he drinks alcohol.  Review of Systems No palpitations, dizziness, syncope.  Physical Examination Filed  Vitals:   12/03/12 0905  BP: 130/70  Pulse: 83   Filed Weights   12/03/12 0905  Weight: 207 lb (93.895 kg)    Obese male in no acute distress.  HEENT: Conjunctiva and lids normal, oropharynx clear with poor dentition.  Neck: Supple, no elevated JVP, no thyromegaly.  Lungs: Diminished breath sounds, nonlabored breathing at rest.  Cardiac: Regular rate and rhythm with ectopics, no S3, soft systolic murmur, no pericardial rub.  Abdomen: Protuberant, bowel sounds present, no guarding or rebound.  Extremities: Trace edema, distal pulses 1+.    Problem List and Plan   Secondary cardiomyopathy Normalization of LV function as of April. Medical regimen is fairly minimal, not on ACE inhibitor with history of acute renal failure, but has been able to tolerate Coreg and Lasix. No change in current regimen.  CKD (chronic kidney disease), stage III Recent lab work shows stable renal function, creatinine 1.2, normal potassium 4.7 without supplement.  Atrial fibrillation Quiescent, no obvious recurrences. He is not anticoagulated with history of spontaneous rectus sheath hematoma and hemorrhagic shock.    Jonelle Sidle, M.D., F.A.C.C.

## 2012-12-03 NOTE — Patient Instructions (Addendum)
Your physician recommends that you schedule a follow-up appointment in: 4 months with SM

## 2012-12-03 NOTE — Assessment & Plan Note (Signed)
Quiescent, no obvious recurrences. He is not anticoagulated with history of spontaneous rectus sheath hematoma and hemorrhagic shock.

## 2012-12-03 NOTE — Assessment & Plan Note (Signed)
Recent lab work shows stable renal function, creatinine 1.2, normal potassium 4.7 without supplement.

## 2012-12-06 IMAGING — CR DG CHEST 2V
3 series · 3 of 3 positions shown · non-contrast
Comparison: Radiographs 04/06/2012 and 04/03/2012. Abdominal CT
today.

CLINICAL DATA: Fever, weakness and shortness of breath.  Right side
hematoma.  Right pleural effusion.

CHEST - 2 VIEW

[x chest ap]
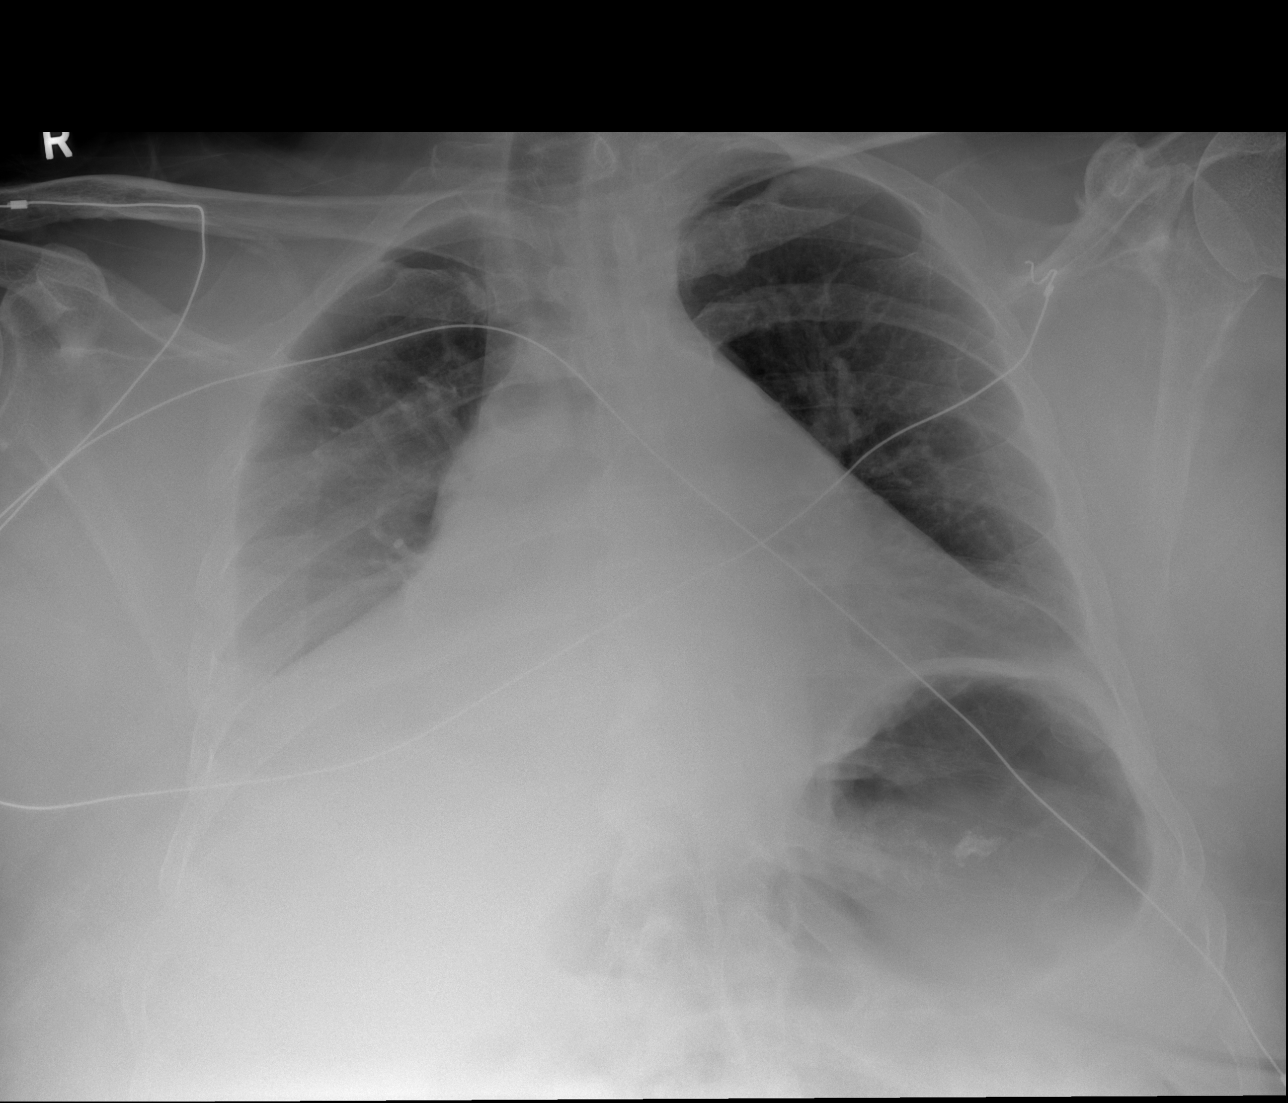

[x chest decub (1 of 2)]
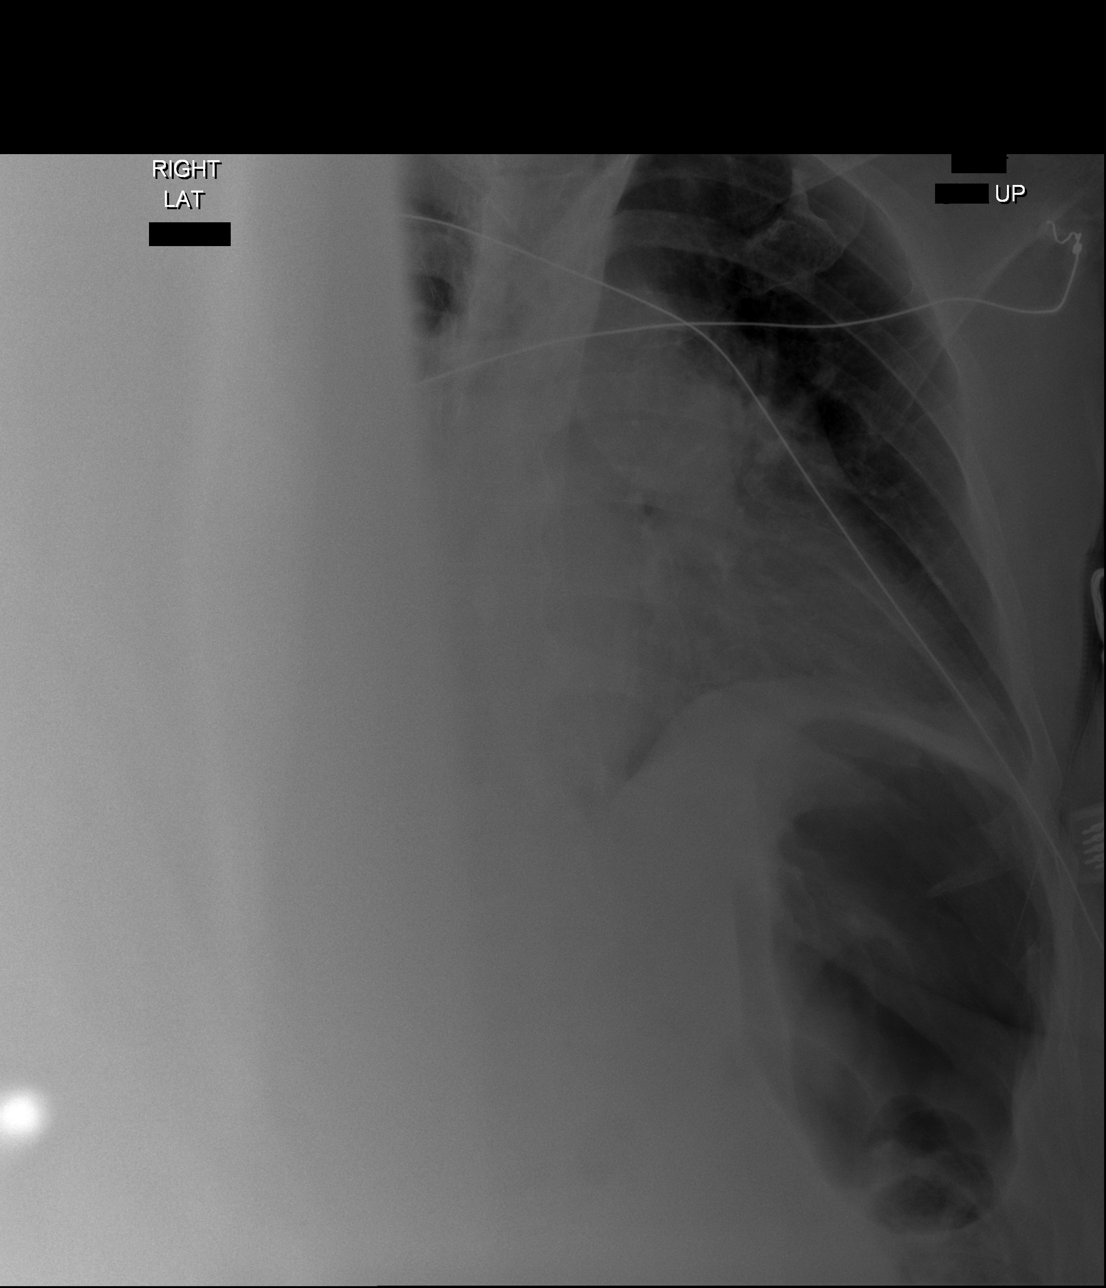

[x chest decub (2 of 2)]
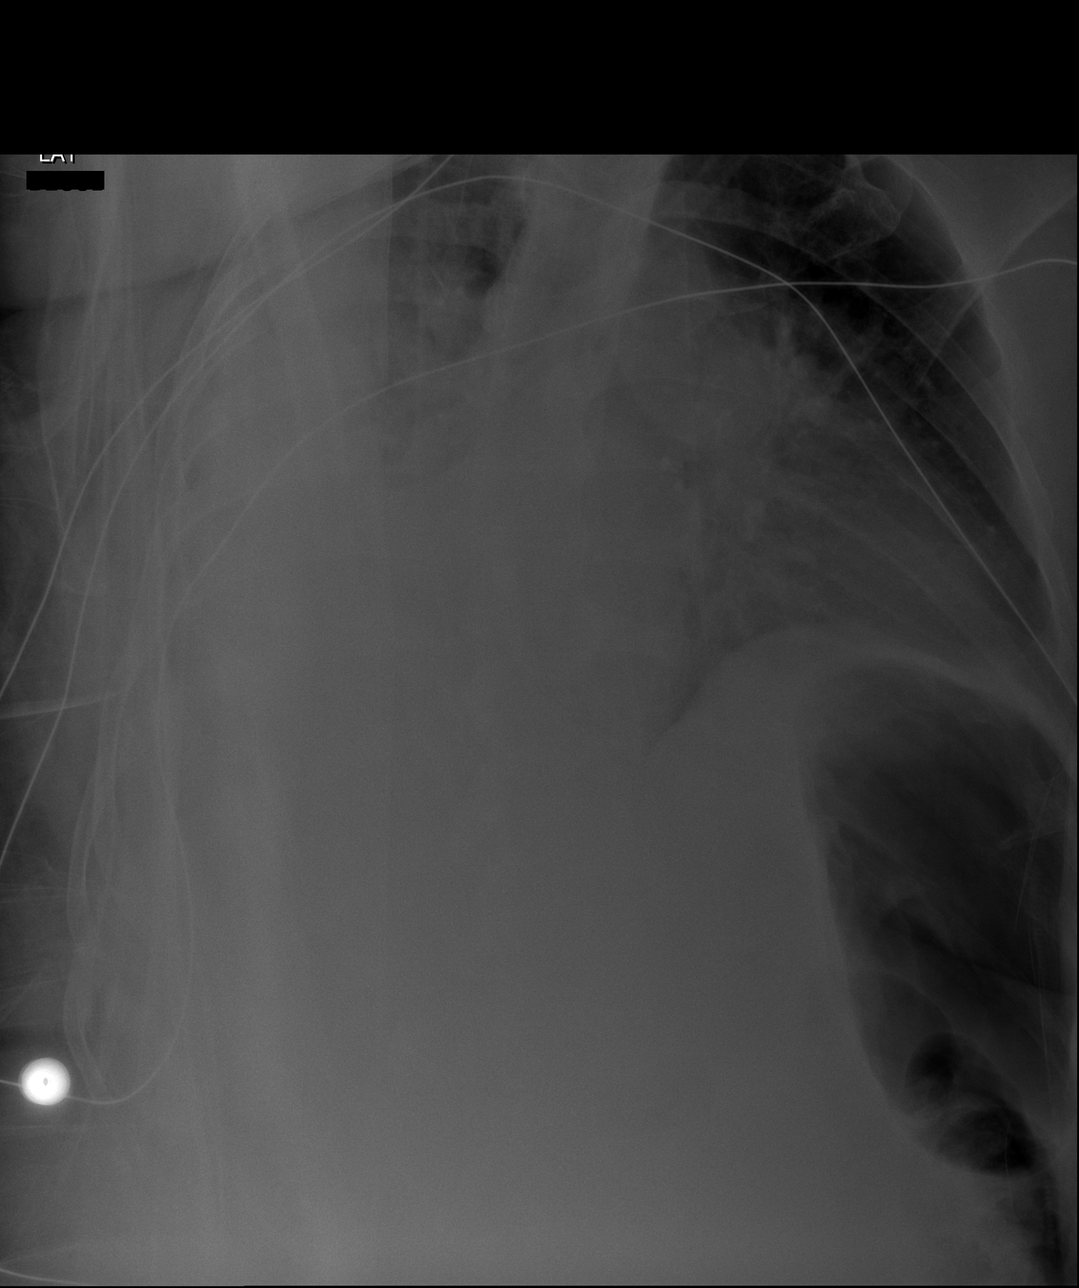

[3 of 3 positions shown; findings below may reference images not displayed]

FINDINGS: A moderate sized right pleural effusion demonstrates free-
flowing on the right lateral decubitus views.  There is underlying
right basilar atelectasis.  No significant pleural effusion is
apparent on the left.
IMPRESSION: Free-flowing moderate sized right pleural effusion.

## 2013-05-15 ENCOUNTER — Telehealth: Payer: Self-pay | Admitting: *Deleted

## 2013-05-15 NOTE — Telephone Encounter (Signed)
Phone call from [facility] forwarded to me. Patient reportedly to undergo extraction of 7 teeth tomorrow by Dr. Lyndal Rainbow. We were called to ask about the patient's "blood thinner." He was last seen in December 2013. His medication list was reviewed and includes only Coreg and Lasix. I explained that we did not have the patient on any "blood thinners" - no aspirin, Coumadin, or novel anticoagulants. I explained that they should check with the patient's pharmacy and primary care physician to make sure that he is not on any additional medications prescribed by someone else.  Jonelle Sidle, M.D., F.A.C.C.

## 2013-05-15 NOTE — Telephone Encounter (Signed)
noted 

## 2013-05-15 NOTE — Telephone Encounter (Signed)
Has patient planned for 5 extractions tomorrow.  Wanting to discuss medications.    [name] that this is a [place] patient and he had not been seen in office since 11/2012.  Message will be sent to provider for advice.

## 2015-03-01 ENCOUNTER — Encounter (HOSPITAL_COMMUNITY): Payer: Self-pay

## 2015-03-01 ENCOUNTER — Other Ambulatory Visit: Payer: Self-pay

## 2015-03-01 ENCOUNTER — Inpatient Hospital Stay (HOSPITAL_COMMUNITY)
Admission: EM | Admit: 2015-03-01 | Discharge: 2015-03-07 | DRG: 291 | Disposition: A | Discharge status: Home / Self Care | Payer: Medicare Other | Attending: Cardiovascular Disease | Admitting: Cardiovascular Disease

## 2015-03-01 ENCOUNTER — Emergency Department (HOSPITAL_COMMUNITY): Payer: Medicare Other

## 2015-03-01 DIAGNOSIS — I5021 Acute systolic (congestive) heart failure: Secondary | ICD-10-CM | POA: Diagnosis not present

## 2015-03-01 DIAGNOSIS — N170 Acute kidney failure with tubular necrosis: Secondary | ICD-10-CM | POA: Diagnosis not present

## 2015-03-01 DIAGNOSIS — N179 Acute kidney failure, unspecified: Secondary | ICD-10-CM | POA: Insufficient documentation

## 2015-03-01 DIAGNOSIS — E874 Mixed disorder of acid-base balance: Secondary | ICD-10-CM | POA: Diagnosis present

## 2015-03-01 DIAGNOSIS — I4891 Unspecified atrial fibrillation: Secondary | ICD-10-CM | POA: Diagnosis present

## 2015-03-01 DIAGNOSIS — I129 Hypertensive chronic kidney disease with stage 1 through stage 4 chronic kidney disease, or unspecified chronic kidney disease: Secondary | ICD-10-CM | POA: Diagnosis present

## 2015-03-01 DIAGNOSIS — E876 Hypokalemia: Secondary | ICD-10-CM | POA: Diagnosis present

## 2015-03-01 DIAGNOSIS — T502X5A Adverse effect of carbonic-anhydrase inhibitors, benzothiadiazides and other diuretics, initial encounter: Secondary | ICD-10-CM | POA: Diagnosis present

## 2015-03-01 DIAGNOSIS — I471 Supraventricular tachycardia: Secondary | ICD-10-CM | POA: Diagnosis present

## 2015-03-01 DIAGNOSIS — I4892 Unspecified atrial flutter: Secondary | ICD-10-CM | POA: Diagnosis present

## 2015-03-01 DIAGNOSIS — I340 Nonrheumatic mitral (valve) insufficiency: Secondary | ICD-10-CM | POA: Diagnosis not present

## 2015-03-01 DIAGNOSIS — R1900 Intra-abdominal and pelvic swelling, mass and lump, unspecified site: Secondary | ICD-10-CM | POA: Diagnosis not present

## 2015-03-01 DIAGNOSIS — E119 Type 2 diabetes mellitus without complications: Secondary | ICD-10-CM | POA: Diagnosis present

## 2015-03-01 DIAGNOSIS — R570 Cardiogenic shock: Secondary | ICD-10-CM | POA: Diagnosis present

## 2015-03-01 DIAGNOSIS — I509 Heart failure, unspecified: Secondary | ICD-10-CM

## 2015-03-01 DIAGNOSIS — Z452 Encounter for adjustment and management of vascular access device: Secondary | ICD-10-CM

## 2015-03-01 DIAGNOSIS — J811 Chronic pulmonary edema: Secondary | ICD-10-CM | POA: Diagnosis not present

## 2015-03-01 DIAGNOSIS — E875 Hyperkalemia: Secondary | ICD-10-CM | POA: Diagnosis present

## 2015-03-01 DIAGNOSIS — I5023 Acute on chronic systolic (congestive) heart failure: Principal | ICD-10-CM | POA: Diagnosis present

## 2015-03-01 DIAGNOSIS — I481 Persistent atrial fibrillation: Secondary | ICD-10-CM | POA: Diagnosis present

## 2015-03-01 DIAGNOSIS — N189 Chronic kidney disease, unspecified: Secondary | ICD-10-CM | POA: Diagnosis not present

## 2015-03-01 DIAGNOSIS — K469 Unspecified abdominal hernia without obstruction or gangrene: Secondary | ICD-10-CM

## 2015-03-01 DIAGNOSIS — J984 Other disorders of lung: Secondary | ICD-10-CM | POA: Diagnosis not present

## 2015-03-01 DIAGNOSIS — R0602 Shortness of breath: Secondary | ICD-10-CM | POA: Diagnosis not present

## 2015-03-01 DIAGNOSIS — I480 Paroxysmal atrial fibrillation: Secondary | ICD-10-CM | POA: Diagnosis not present

## 2015-03-01 DIAGNOSIS — Z9114 Patient's other noncompliance with medication regimen: Secondary | ICD-10-CM | POA: Diagnosis present

## 2015-03-01 DIAGNOSIS — I517 Cardiomegaly: Secondary | ICD-10-CM | POA: Diagnosis not present

## 2015-03-01 DIAGNOSIS — N183 Chronic kidney disease, stage 3 (moderate): Secondary | ICD-10-CM | POA: Diagnosis present

## 2015-03-01 LAB — CBC WITH DIFFERENTIAL/PLATELET
BASOS ABS: 0 10*3/uL (ref 0.0–0.1)
BASOS ABS: 0 10*3/uL (ref 0.0–0.1)
BASOS PCT: 0 % (ref 0–1)
Basophils Relative: 0 % (ref 0–1)
EOS ABS: 0 10*3/uL (ref 0.0–0.7)
Eosinophils Absolute: 0 10*3/uL (ref 0.0–0.7)
Eosinophils Relative: 1 % (ref 0–5)
Eosinophils Relative: 0 % (ref 0–5)
HCT: 47.4 % (ref 39.0–52.0)
HEMATOCRIT: 49 % (ref 39.0–52.0)
HEMOGLOBIN: 15.8 g/dL (ref 13.0–17.0)
Hemoglobin: 15.5 g/dL (ref 13.0–17.0)
LYMPHS PCT: 25 % (ref 12–46)
LYMPHS PCT: 16 % (ref 12–46)
Lymphs Abs: 1.5 10*3/uL (ref 0.7–4.0)
Lymphs Abs: 1.2 10*3/uL (ref 0.7–4.0)
MCH: 29.9 pg (ref 26.0–34.0)
MCH: 29.6 pg (ref 26.0–34.0)
MCHC: 32.2 g/dL (ref 30.0–36.0)
MCHC: 32.7 g/dL (ref 30.0–36.0)
MCV: 92.6 fL (ref 78.0–100.0)
MCV: 90.5 fL (ref 78.0–100.0)
MONO ABS: 0.5 10*3/uL (ref 0.1–1.0)
Monocytes Absolute: 0.7 10*3/uL (ref 0.1–1.0)
Monocytes Relative: 8 % (ref 3–12)
Monocytes Relative: 9 % (ref 3–12)
NEUTROS ABS: 4 10*3/uL (ref 1.7–7.7)
NEUTROS PCT: 66 % (ref 43–77)
Neutro Abs: 5.3 10*3/uL (ref 1.7–7.7)
Neutrophils Relative %: 74 % (ref 43–77)
Platelets: 191 10*3/uL (ref 150–400)
Platelets: 138 10*3/uL — ABNORMAL LOW (ref 150–400)
RBC: 5.29 MIL/uL (ref 4.22–5.81)
RBC: 5.24 MIL/uL (ref 4.22–5.81)
RDW: 14.2 % (ref 11.5–15.5)
RDW: 14.3 % (ref 11.5–15.5)
WBC: 6.1 10*3/uL (ref 4.0–10.5)
WBC: 7.2 10*3/uL (ref 4.0–10.5)

## 2015-03-01 LAB — BLOOD GAS, VENOUS
Acid-base deficit: 3 mmol/L — ABNORMAL HIGH (ref 0.0–2.0)
BICARBONATE: 21.7 meq/L (ref 20.0–24.0)
FIO2: 0.21 %
O2 Saturation: 85.9 %
PH VEN: 7.357 — AB (ref 7.250–7.300)
Patient temperature: 98.6
TCO2: 22.9 mmol/L (ref 0–100)
pCO2, Ven: 39.6 mmHg — ABNORMAL LOW (ref 45.0–50.0)
pO2, Ven: 58.1 mmHg — ABNORMAL HIGH (ref 30.0–45.0)

## 2015-03-01 LAB — I-STAT CG4 LACTIC ACID, ED
Lactic Acid, Venous: 4.08 mmol/L (ref 0.5–2.0)
Lactic Acid, Venous: 2.9 mmol/L (ref 0.5–2.0)

## 2015-03-01 LAB — BASIC METABOLIC PANEL
ANION GAP: 11 (ref 5–15)
BUN: 25 mg/dL — AB (ref 6–23)
CALCIUM: 9.2 mg/dL (ref 8.4–10.5)
CHLORIDE: 106 mmol/L (ref 96–112)
CO2: 24 mmol/L (ref 19–32)
Creatinine, Ser: 1.58 mg/dL — ABNORMAL HIGH (ref 0.50–1.35)
GFR, EST AFRICAN AMERICAN: 51 mL/min — AB (ref >90)
GFR, EST NON AFRICAN AMERICAN: 44 mL/min — AB (ref >90)
Glucose, Bld: 172 mg/dL — ABNORMAL HIGH (ref 70–99)
Potassium: 4.5 mmol/L (ref 3.5–5.1)
SODIUM: 141 mmol/L (ref 135–145)

## 2015-03-01 LAB — LACTIC ACID, PLASMA: Lactic Acid, Venous: 2.4 mmol/L (ref 0.5–2.0)

## 2015-03-01 LAB — TROPONIN I: TROPONIN I: 0.13 ng/mL — AB (ref <0.031)

## 2015-03-01 LAB — PROTIME-INR
INR: 1.1 (ref 0.00–1.49)
Prothrombin Time: 14.3 seconds (ref 11.6–15.2)

## 2015-03-01 LAB — BRAIN NATRIURETIC PEPTIDE: B Natriuretic Peptide: 577 pg/mL — ABNORMAL HIGH (ref 0.0–100.0)

## 2015-03-01 LAB — MRSA PCR SCREENING: MRSA by PCR: NEGATIVE

## 2015-03-01 MED ORDER — FUROSEMIDE 10 MG/ML IJ SOLN
40.0000 mg | Freq: Once | INTRAMUSCULAR | Status: AC
  Administered 2015-03-01: 40 mg via INTRAVENOUS
  Filled 2015-03-01: qty 4

## 2015-03-01 MED ORDER — LABETALOL HCL 5 MG/ML IV SOLN
10.0000 mg | Freq: Once | INTRAVENOUS | Status: AC
  Administered 2015-03-01: 10 mg via INTRAVENOUS
  Filled 2015-03-01: qty 4

## 2015-03-01 MED ORDER — FUROSEMIDE 10 MG/ML IJ SOLN
60.0000 mg | Freq: Two times a day (BID) | INTRAMUSCULAR | Status: DC
  Administered 2015-03-01: 60 mg via INTRAVENOUS
  Filled 2015-03-01 (×3): qty 6

## 2015-03-01 MED ORDER — ONDANSETRON HCL 4 MG/2ML IJ SOLN
INTRAMUSCULAR | Status: AC
  Administered 2015-03-01: 4 mg via INTRAVENOUS
  Filled 2015-03-01: qty 2

## 2015-03-01 MED ORDER — ONDANSETRON HCL 4 MG/2ML IJ SOLN: 4.0000 mg | Freq: Four times a day (QID) | INTRAMUSCULAR | Status: DC | PRN

## 2015-03-01 MED ORDER — SODIUM CHLORIDE 0.9 % IV SOLN
250.0000 mL | INTRAVENOUS | Status: DC | PRN
  Administered 2015-03-05: 500 mL via INTRAVENOUS

## 2015-03-01 MED ORDER — SPIRONOLACTONE 12.5 MG HALF TABLET: 12.5000 mg | ORAL_TABLET | Freq: Every day | ORAL | Status: DC

## 2015-03-01 MED ORDER — AMIODARONE IV BOLUS ONLY 150 MG/100ML
150.0000 mg | Freq: Once | INTRAVENOUS | Status: AC
  Administered 2015-03-01: 150 mg via INTRAVENOUS
  Filled 2015-03-01: qty 100

## 2015-03-01 MED ORDER — SODIUM CHLORIDE 0.9 % IJ SOLN
3.0000 mL | Freq: Two times a day (BID) | INTRAMUSCULAR | Status: DC
  Administered 2015-03-01 – 2015-03-07 (×10): 3 mL via INTRAVENOUS

## 2015-03-01 MED ORDER — SODIUM CHLORIDE 0.9 % IJ SOLN: 3.0000 mL | INTRAMUSCULAR | Status: DC | PRN

## 2015-03-01 MED ORDER — LISINOPRIL 2.5 MG PO TABS: 2.5000 mg | ORAL_TABLET | Freq: Every day | ORAL | Status: DC

## 2015-03-01 MED ORDER — HEPARIN SODIUM (PORCINE) 5000 UNIT/ML IJ SOLN
5000.0000 [IU] | Freq: Three times a day (TID) | INTRAMUSCULAR | Status: DC
  Administered 2015-03-01 – 2015-03-04 (×8): 5000 [IU] via SUBCUTANEOUS
  Filled 2015-03-01 (×11): qty 1

## 2015-03-01 MED ORDER — AMIODARONE HCL IN DEXTROSE 360-4.14 MG/200ML-% IV SOLN
30.0000 mg/h | INTRAVENOUS | Status: DC
  Filled 2015-03-01: qty 200

## 2015-03-01 MED ORDER — ACETAMINOPHEN 325 MG PO TABS: 650.0000 mg | ORAL_TABLET | ORAL | Status: DC | PRN

## 2015-03-01 MED ORDER — ASPIRIN EC 325 MG PO TBEC
325.0000 mg | DELAYED_RELEASE_TABLET | Freq: Once | ORAL | Status: AC
  Administered 2015-03-01: 325 mg via ORAL
  Filled 2015-03-01: qty 1

## 2015-03-01 MED ORDER — ONDANSETRON HCL 4 MG/2ML IJ SOLN
4.0000 mg | Freq: Once | INTRAMUSCULAR | Status: AC
  Administered 2015-03-01: 4 mg via INTRAVENOUS

## 2015-03-01 MED ORDER — AMIODARONE HCL IN DEXTROSE 360-4.14 MG/200ML-% IV SOLN
60.0000 mg/h | INTRAVENOUS | Status: DC
  Administered 2015-03-01: 60 mg/h via INTRAVENOUS
  Filled 2015-03-01 (×2): qty 200

## 2015-03-01 NOTE — ED Notes (Signed)
Attempted to call report. RN to call back. 

## 2015-03-01 NOTE — ED Notes (Signed)
Dr Elesa Massed Notified of critical Lactic Acid.

## 2015-03-01 NOTE — Progress Notes (Signed)
Lab phoned and will be up to collect ordered labs. Lab tech stated that the computer systems were down causing the delay. Also 45F foley placed with no difficulties noted with the assistance to NT. Amioderone infusing at this time. Patient and spouse watched HF  video x2 and handout given to Patient. Will continue to monitor.

## 2015-03-01 NOTE — H&P (Addendum)
Physician History and Physical    Martin [name] MRN: [id number] DOB/AGE: [id number] y.o. Admit date: 03/01/2015   Primary Cardiologist:  Diona Browner  CC: SOB  HPI: Martin [name] is a 69 y.o. male with a PMHx of atrial fibrillation not on anticoagulation, CHF, HTN, prior history of cardiogenic shock in 2013 who presents to the [facility] ER complaining of moderate shortness of breath that began 3 weeks ago. Worse with exertion and lying flat. He reports associated edema in feet bilaterally. He denies associated cough, fever, chest palpitations, or chest pain. He states that he stopped taking Lasix 1 year ago due to it causing him abdominal discomfort. He was not able to be compliant with any other medications. He has not been following with [facility], last seen by Dr. Diona Browner in 2013.   He HR was found to be in 150s with stable BP initially but at OSH he received labetolol and became hypotensive with elevated lactate. Upon arrival to [facility], HR at 150 bpm, consistent with Atach (not afib), his BP is stable and he denied any chest pain but complain of SOB and LE swelling, as well as abdominal mass (hernia) first developed couple of years ago when he was started with anticoagulation.    Review of systems: A review of 10 organ systems was done and is negative except as stated above in HPI  Past Medical History  Diagnosis Date  . Essential hypertension, benign   . Atrial fibrillation   . Cardiomyopathy     Transient cardiomyopathy with EF 30-35% 03/27/12, improved to 55-60% 04/16/12  . Acute renal failure     03/2012 (not on ACEI due to this) - renal US unremarkable  . Rectus sheath hematoma     Spontaneous during 03/2012 hospitalization managed conservatively with blood products  . Respiratory failure     VDRF 03/2012 in setting of CHF  . Acute blood loss anemia     Due to rectus sheath hematoma 03/2012  . Unspecified sleep apnea     Suspected during 03/2012 hospitalization   . Hematuria  03/2012  . Hyperglycemia 03/2012  . Shock     03/2012 hospitalization - Cardiogenic shock requiring pressors initially, followed by hemorrhagic shock later in hospitalization  . Pleural effusion 03/2012  . CHF (congestive heart failure)    History reviewed. No pertinent past surgical history. History   Social History  . Marital Status: Married    Spouse Name: N/A  . Number of Children: N/A  . Years of Education: N/A   Occupational History  . Not on file.   Social History Main Topics  . Smoking status: Never Smoker   . Smokeless tobacco: Not on file  . Alcohol Use: Yes     Comment: former  . Drug Use: No  . Sexual Activity: Yes   Other Topics Concern  . Not on file   Social History Narrative    No family history on file.   No Known Allergies  Prescriptions prior to admission  Medication Sig Dispense Refill Last Dose  . carvedilol (COREG) 6.25 MG tablet Take 1 tablet (6.25 mg total) by mouth 2 (two) times daily with a meal. (Patient not taking: Reported on 03/01/2015) 60 tablet 6 Taking  . furosemide (LASIX) 40 MG tablet Take 1 tablet (40 mg total) by mouth 2 (two) times daily. (Patient not taking: Reported on 03/01/2015) 60 tablet 6 Taking    No current facility-administered medications for this encounter.  Physical Exam: Blood pressure 152/135,  pulse 151, temperature 97.2 F (36.2 C), temperature source Oral, resp. rate 31, height  (1.651 m), weight 95.1 kg (209 lb 10.5 oz), SpO2 98 %.; Body mass index is 34.89 kg/(m^2). Temp:  [96.1 F (35.6 C)-97.7 F (36.5 C)] 97.2 F (36.2 C) (03/06 1646) Pulse Rate:  [51-157] 151 (03/06 1646) Resp:  [18-35] 31 (03/06 1646) BP: (85-152)/(62-135) 152/135 mmHg (03/06 1646) SpO2:  [90 %-100 %] 98 % (03/06 1646) Weight:  [95.1 kg (209 lb 10.5 oz)] 95.1 kg (209 lb 10.5 oz) (03/06 1646)   Intake/Output Summary (Last 24 hours) at 03/01/15 1735 Last data filed at 03/01/15 1505  Gross per 24 hour  Intake      0 ml  Output    325  ml  Net   -325 ml   General: NAD Heent: MMM Neck: No JVD  CV: Nondisplaced PMI.  RRR, nl S1/S2, no S3/S4, no murmur. No carotid bruit   Lungs: Clear to auscultation bilaterally with normal respiratory effort Abdomen: Soft, nontender, nondistended Extremities: No clubbing or cyanosis.  Normal pedal pulses. No pedal edema Skin: Intact without lesions or rashes  Neurologic: Alert and oriented x 3, grossly nonfocal  Psych: Normal mood and affect    Labs:  Recent Labs  03/01/15 1010  TROPONINI 0.13*   Lab Results  Component Value Date   WBC 6.1 03/01/2015   HGB 15.8 03/01/2015   HCT 49.0 03/01/2015   MCV 92.6 03/01/2015   PLT 191 03/01/2015    Recent Labs Lab 03/01/15 1010  NA 141  K 4.5  CL 106  CO2 24  BUN 25*  CREATININE 1.58*  CALCIUM 9.2  GLUCOSE 172*   No results found for: CHOL, HDL, LDLCALC, TRIG     EKG:  Atrial tachycardia with rate of 150 bpm, with non specific ST/T changes.   Radiology:  Dg Chest Portable 1 View  03/01/2015   CLINICAL DATA:  Short of breath and history of CHF.  EXAM: PORTABLE CHEST - 1 VIEW  COMPARISON:  04/09/2012  FINDINGS: Mildly degraded exam due to AP portable technique and patient body habitus. Midline trachea. Cardiomegaly accentuated by AP portable technique. Small right pleural effusion, with suggestion of loculation laterally. No pneumothorax. low lung volumes. No overt congestive failure. Mild right hemidiaphragm elevation with right base airspace disease.  IMPRESSION: Inferior lateral right chest opacity is likely due to pleural fluid and adjacent infection or atelectasis. The pleural fluid may have minimal loculation laterally.  Cardiomegaly with low lung volumes.  No overt congestive failure.  Decreased sensitivity and specificity exam due to technique related factors, as described above.   Electronically Signed   By: Jeronimo Greaves M.D.   On: 03/01/2015 11:42    ASSESSMENT: 69 y.o. male with a PMHx of atrial fibrillation not on  anticoagulation, CHF, HTN, prior history of cardiogenic shock in 2013, h/o medical non-compliance who presented with  1. Acute HF decompensation/early cardiogenic shock with evidence of hypoperfusion, 2/2 to medical non-compliance and #2.  2. Uncontrolled Atrial tachycardia, likely contributed to #1 3. Lactate acidosis likely 2/2 #1 and negative inotrope effect of labetalol, improving.  4. Pulmonary edema and pleural effusion likely 2/2 #1 5. Rule out infection 6. AKI on CKD 7. History of Afib    PLAN:  1. Aggressive diuresis, start with Lasix 60 mg IV BID with goal of 1.5-2 L negative per day 2. Amiodarone for Atach rate control, he is not in afib.  NO beta blocker due to concern of  acute decompensation.  3. Echocardiogram to re-evaluate EF 4. Will hold off BB till euvolemia, hold off ACEI and Aldactone till Creatinine stabilizes.  5. Will consider Levophed, or Dobutamine gtt(if HR tolerates) if lactate acidosis persistents.  6. U/S abd for abdominal hernia/mass.  7. Once ruled out abdominal mass, pt needs anti-coagulation in long term. However, extended education is warranted for medical compliance before starting any agents.    Signed: Haydee Salter, MD Cardiology Fellow 03/01/2015, 5:35 PM

## 2015-03-01 NOTE — ED Notes (Addendum)
Pt c/o sob x 3 weeks.  Denies cough, fever, or chest pain.  Reports swelling in feet.

## 2015-03-01 NOTE — ED Notes (Signed)
Patient states nausea is better. Remains alert/oriented x4.

## 2015-03-01 NOTE — Progress Notes (Signed)
Called respiratory, Becky, to notify pt has a blood gas draw per md orders.

## 2015-03-01 NOTE — Progress Notes (Signed)
Just got in touch with cardiology on call Dr. Verdie Mosher. Notified doctor pt came to floor from [facility] with a heart rate in 150's sustained and high diastolic b/p 100's. Notified doctor Nurse has no orders for patient. MD stated he would be coming to see pt. Willl continue to monitor.

## 2015-03-01 NOTE — ED Notes (Signed)
Report given to Lynwood Dawley, Charity fundraiser at [facility]. Awaiting carelink for transport to facility.

## 2015-03-01 NOTE — ED Notes (Signed)
Family informed of transfer and current bed status at [facility]. Patient states breathing is easier. BP remains soft but improved from earlier.

## 2015-03-01 NOTE — ED Notes (Signed)
Report given to [facility]. ETA 30 minutes.

## 2015-03-01 NOTE — ED Notes (Signed)
Upon reassessment of VS, BP decreased. MD aware. HR 148.

## 2015-03-01 NOTE — ED Notes (Signed)
Carelink at bedside 

## 2015-03-01 NOTE — ED Notes (Signed)
Patient c/o nausea. MD aware and verbal order for 4 mg Zofran obtained and given. See MAR for documentation.

## 2015-03-01 NOTE — ED Notes (Signed)
Patient left ED at this time with carelink crew.

## 2015-03-01 NOTE — ED Provider Notes (Signed)
This chart was scribed for Martin Maw Sakari Raisanen, DO by Roxy Cedar, ED Scribe. This patient was seen in room APA16A/APA16A and the patient's care was started at 10:37 AM.   TIME SEEN: 10:37 AM  CHIEF COMPLAINT: Shortness of Breath  HPI: [name] is a 69 y.o. male with a PMHx of atrial fibrillation not on anticoagulation, CHF, HTN, prior history of cardiogenic shock in 2013 who presents to the Emergency Department complaining of moderate shortness of breath that began 3 weeks ago. Worse with exertion and lying flat. He reports associated edema in feet bilaterally. He denies associated cough, fever, chest palpitations, or chest pain. He states that he stopped taking Lasix 1 year ago due to it causing him abdominal discomfort. Patient was seen by [facility] cardiology when discharged from [facility]. States he does not have a primary care physician.  ROS: See HPI Constitutional: no fever  Eyes: no drainage  ENT: no runny nose   Cardiovascular:  no chest pain Resp: SOB  GI: no vomiting GU: no dysuria Integumentary: no rash  Allergy: no hives  Musculoskeletal: leg swelling  Neurological: no slurred speech ROS otherwise negative  PAST MEDICAL HISTORY/PAST SURGICAL HISTORY:  Past Medical History  Diagnosis Date  . Essential hypertension, benign   . Atrial fibrillation   . Cardiomyopathy     Transient cardiomyopathy with EF 30-35% 03/27/12, improved to 55-60% 04/16/12  . Acute renal failure     03/2012 (not on ACEI due to this) - renal US unremarkable  . Rectus sheath hematoma     Spontaneous during 03/2012 hospitalization managed conservatively with blood products  . Respiratory failure     VDRF 03/2012 in setting of CHF  . Acute blood loss anemia     Due to rectus sheath hematoma 03/2012  . Unspecified sleep apnea     Suspected during 03/2012 hospitalization   . Hematuria 03/2012  . Hyperglycemia 03/2012  . Shock     03/2012 hospitalization - Cardiogenic shock requiring pressors initially,  followed by hemorrhagic shock later in hospitalization  . Pleural effusion 03/2012  . CHF (congestive heart failure)     MEDICATIONS:  Prior to Admission medications   Medication Sig Start Date End Date Taking? Authorizing Provider  carvedilol (COREG) 6.25 MG tablet Take 1 tablet (6.25 mg total) by mouth 2 (two) times daily with a meal. 11/21/12   Kathlen Brunswick, MD  furosemide (LASIX) 40 MG tablet Take 1 tablet (40 mg total) by mouth 2 (two) times daily. 04/17/12 04/17/13  Dayna N Dunn, PA-C    ALLERGIES:  No Known Allergies  SOCIAL HISTORY:  History  Substance Use Topics  . Smoking status: Never Smoker   . Smokeless tobacco: Not on file  . Alcohol Use: Yes     Comment: former    FAMILY HISTORY: No family history on file.  EXAM: Triage Vitals: BP 136/110 mmHg  Pulse 157  Resp 18  SpO2 96% Temp 96.1 F  CONSTITUTIONAL: Alert and oriented and responds appropriately to questions. Well-appearing; well-nourished, in no distress HEAD: Normocephalic EYES: Conjunctivae clear, PERRL ENT: normal nose; no rhinorrhea; moist mucous membranes; pharynx without lesions noted NECK: Supple, no meningismus, no LAD  CARD: Regular and Tachycardic; S1 and S2 appreciated; no murmurs, no clicks, no rubs, no gallops RESP: Normal chest excursion without splinting or tachypnea; breath sounds clear and equal bilaterally; no wheezes, no rhonchi, bibasilar crackles.  ABD/GI: Normal bowel sounds; non-distended; soft, non-tender, no rebound, no guarding BACK:  The back appears normal  and is non-tender to palpation, there is no CVA tenderness EXT: Normal ROM in all joints; non-tender to palpation; 3+ pitting edema to BLE; normal capillary refill; no cyanosis    SKIN: Normal color for age and race; warm NEURO: Moves all extremities equally PSYCH: The patient's mood and manner are appropriate. Grooming and personal hygiene are appropriate.  MEDICAL DECISION MAKING: Patient here with what appears to be a  CHF exacerbation. He is extremely tachycardic but appears to be in a sinus or atrial tachycardia and not in atrial fibrillation. Blood pressure also mildly elevated. Will give Lasix, aspirin, labetalol. We'll obtain cardiac labs, chest x-ray. Denies any chest pain. Anticipate admission.  ED PROGRESS: Chest x-ray so stable cardiomegaly and diffuse edema. His rectal temp is 96.1 and he does appear slightly mottled. Will place bair hugger on pt.   This may be from hypoperfusion from CHF.    11:30 AM  Patient's lactate is 4.08 again likely secondary to poor perfusion. Troponin slightly elevated at 0.13. He has received aspirin. He denies any chest pain. BNP is elevated at 577. Will consult cardiology on call.  11:40 AM  D/w Dr. Purvis Sheffield on-call for cardiology. Given the history of cardiogenic shock and how tenuous this patient is we both agree the patient would benefit from transfer to [facility] to see a cardiologist. He did have a brief drop in his blood pressure but this is improving. Patient is still mentating normally, smiling and appropriate. Cardiology feels that if patient's blood pressure continues to drop they recommend started the patient on levophed.  We'll transfer to stepdown bed when available. Patient and family updated with plan.   12:30 PM  Pt continues to have systolic blood pressures in the upper 80s to 90s but his MAPs are greater than 65. We'll continue to closely monitor.  3:30 PM  Pt to be transported to [facility] soon. Blood pressure is 112/85. No respiratory distress. Oxygen saturation 100% on room air.      EKG Interpretation  Date/Time:  Sunday March 01 2015 10:21:30 EST Ventricular Rate:  153 PR Interval:  40 QRS Duration: 92 QT Interval:  354 QTC Calculation: 565 R Axis:   64 Text Interpretation:  Sinus or ectopic atrial tachycardia Ventricular premature complex Nonspecific T abnormalities, lateral leads Prolonged QT interval Confirmed by Amariona Rathje,  DO, Liyat Faulkenberry  ([id number]) on 03/01/2015 10:30:32 AM         CRITICAL CARE Performed by: Raelyn Number   Total critical care time: 45 minutes - CHF exacerbation, cardiogenic shock  Critical care time was exclusive of separately billable procedures and treating other patients.  Critical care was necessary to treat or prevent imminent or life-threatening deterioration.  Critical care was time spent personally by me on the following activities: development of treatment plan with patient and/or surrogate as well as nursing, discussions with consultants, evaluation of patient's response to treatment, examination of patient, obtaining history from patient or surrogate, ordering and performing treatments and interventions, ordering and review of laboratory studies, ordering and review of radiographic studies, pulse oximetry and re-evaluation of patient's condition.    I personally performed the services described in this documentation, which was scribed in my presence. The recorded information has been reviewed and is accurate.   Martin Maw Tellis Spivak, DO 03/01/15 1527

## 2015-03-01 NOTE — Progress Notes (Signed)
Darlene in [place] phoned up stated that [place] maty not be completed until morning due to acuity in ER. Darlene also advised that patient do not need to  BB NPO for this [place].

## 2015-03-01 NOTE — Progress Notes (Signed)
Pt just came to floor from [facility] ED via ambulance. Got pt settled into bed. Pt has a high heart rate of 150's and high diastolic blood pressure in 100's. Trying to contact MD.

## 2015-03-02 ENCOUNTER — Inpatient Hospital Stay (HOSPITAL_COMMUNITY): Payer: Medicare Other

## 2015-03-02 DIAGNOSIS — I509 Heart failure, unspecified: Secondary | ICD-10-CM

## 2015-03-02 DIAGNOSIS — N179 Acute kidney failure, unspecified: Secondary | ICD-10-CM

## 2015-03-02 DIAGNOSIS — I471 Supraventricular tachycardia: Secondary | ICD-10-CM

## 2015-03-02 DIAGNOSIS — I5021 Acute systolic (congestive) heart failure: Secondary | ICD-10-CM

## 2015-03-02 DIAGNOSIS — I4891 Unspecified atrial fibrillation: Secondary | ICD-10-CM

## 2015-03-02 DIAGNOSIS — R570 Cardiogenic shock: Secondary | ICD-10-CM

## 2015-03-02 DIAGNOSIS — I4892 Unspecified atrial flutter: Secondary | ICD-10-CM | POA: Insufficient documentation

## 2015-03-02 LAB — BASIC METABOLIC PANEL
Anion gap: 11 (ref 5–15)
Anion gap: 10 (ref 5–15)
BUN: 34 mg/dL — AB (ref 6–23)
BUN: 39 mg/dL — ABNORMAL HIGH (ref 6–23)
CALCIUM: 8.3 mg/dL — AB (ref 8.4–10.5)
CO2: 19 mmol/L (ref 19–32)
CO2: 25 mmol/L (ref 19–32)
CREATININE: 2.52 mg/dL — AB (ref 0.50–1.35)
Calcium: 8.8 mg/dL (ref 8.4–10.5)
Chloride: 107 mmol/L (ref 96–112)
Chloride: 99 mmol/L (ref 96–112)
Creatinine, Ser: 3.03 mg/dL — ABNORMAL HIGH (ref 0.50–1.35)
GFR calc Af Amer: 29 mL/min — ABNORMAL LOW (ref >90)
GFR calc non Af Amer: 25 mL/min — ABNORMAL LOW (ref >90)
GFR calc non Af Amer: 20 mL/min — ABNORMAL LOW (ref >90)
GFR, EST AFRICAN AMERICAN: 23 mL/min — AB (ref >90)
GLUCOSE: 143 mg/dL — AB (ref 70–99)
GLUCOSE: 173 mg/dL — AB (ref 70–99)
POTASSIUM: 5 mmol/L (ref 3.5–5.1)
Potassium: 5.4 mmol/L — ABNORMAL HIGH (ref 3.5–5.1)
Sodium: 137 mmol/L (ref 135–145)
Sodium: 134 mmol/L — ABNORMAL LOW (ref 135–145)

## 2015-03-02 LAB — URINE MICROSCOPIC-ADD ON

## 2015-03-02 LAB — COMPREHENSIVE METABOLIC PANEL
ALT: 35 U/L (ref 0–53)
ANION GAP: 7 (ref 5–15)
AST: 38 U/L — ABNORMAL HIGH (ref 0–37)
Albumin: 3.2 g/dL — ABNORMAL LOW (ref 3.5–5.2)
Alkaline Phosphatase: 66 U/L (ref 39–117)
BILIRUBIN TOTAL: 0.6 mg/dL (ref 0.3–1.2)
BUN: 30 mg/dL — AB (ref 6–23)
CO2: 25 mmol/L (ref 19–32)
Calcium: 8.8 mg/dL (ref 8.4–10.5)
Chloride: 105 mmol/L (ref 96–112)
Creatinine, Ser: 2.11 mg/dL — ABNORMAL HIGH (ref 0.50–1.35)
GFR, EST AFRICAN AMERICAN: 36 mL/min — AB (ref >90)
GFR, EST NON AFRICAN AMERICAN: 31 mL/min — AB (ref >90)
GLUCOSE: 131 mg/dL — AB (ref 70–99)
Potassium: 5 mmol/L (ref 3.5–5.1)
Sodium: 137 mmol/L (ref 135–145)
Total Protein: 6.5 g/dL (ref 6.0–8.3)

## 2015-03-02 LAB — CARBOXYHEMOGLOBIN
CARBOXYHEMOGLOBIN: 0.4 % — AB (ref 0.5–1.5)
CARBOXYHEMOGLOBIN: 0.2 % — AB (ref 0.5–1.5)
Carboxyhemoglobin: 0.3 % — ABNORMAL LOW (ref 0.5–1.5)
Carboxyhemoglobin: 0.3 % — ABNORMAL LOW (ref 0.5–1.5)
METHEMOGLOBIN: 0.9 % (ref 0.0–1.5)
METHEMOGLOBIN: 0.8 % (ref 0.0–1.5)
METHEMOGLOBIN: 1 % (ref 0.0–1.5)
Methemoglobin: 0.9 % (ref 0.0–1.5)
O2 Saturation: 39.7 %
O2 Saturation: 44.8 %
O2 Saturation: 39.5 %
O2 Saturation: 64.6 %
TOTAL HEMOGLOBIN: 15.3 g/dL (ref 13.5–18.0)
TOTAL HEMOGLOBIN: 15.1 g/dL (ref 13.5–18.0)
Total hemoglobin: 15.9 g/dL (ref 13.5–18.0)
Total hemoglobin: 15.6 g/dL (ref 13.5–18.0)

## 2015-03-02 LAB — URINALYSIS, ROUTINE W REFLEX MICROSCOPIC
Bilirubin Urine: NEGATIVE
Glucose, UA: NEGATIVE mg/dL
KETONES UR: NEGATIVE mg/dL
LEUKOCYTES UA: NEGATIVE
NITRITE: NEGATIVE
Protein, ur: 30 mg/dL — AB
Specific Gravity, Urine: 1.01 (ref 1.005–1.030)
UROBILINOGEN UA: 1 mg/dL (ref 0.0–1.0)
pH: 5 (ref 5.0–8.0)

## 2015-03-02 LAB — LACTIC ACID, PLASMA
LACTIC ACID, VENOUS: 2.8 mmol/L — AB (ref 0.5–2.0)
LACTIC ACID, VENOUS: 4 mmol/L — AB (ref 0.5–2.0)
Lactic Acid, Venous: 2.3 mmol/L (ref 0.5–2.0)

## 2015-03-02 LAB — MAGNESIUM: MAGNESIUM: 2 mg/dL (ref 1.5–2.5)

## 2015-03-02 LAB — BRAIN NATRIURETIC PEPTIDE: B NATRIURETIC PEPTIDE 5: 693.5 pg/mL — AB (ref 0.0–100.0)

## 2015-03-02 LAB — GLUCOSE, CAPILLARY: GLUCOSE-CAPILLARY: 161 mg/dL — AB (ref 70–99)

## 2015-03-02 LAB — TROPONIN I
TROPONIN I: 0.1 ng/mL — AB (ref <0.031)
Troponin I: 0.1 ng/mL — ABNORMAL HIGH (ref <0.031)
Troponin I: 0.09 ng/mL — ABNORMAL HIGH (ref <0.031)
Troponin I: 0.1 ng/mL — ABNORMAL HIGH (ref <0.031)

## 2015-03-02 LAB — SODIUM, URINE, RANDOM: Sodium, Ur: 68 mmol/L

## 2015-03-02 LAB — CREATININE, URINE, RANDOM: CREATININE, URINE: 71.53 mg/dL

## 2015-03-02 LAB — TSH: TSH: 1.231 u[IU]/mL (ref 0.350–4.500)

## 2015-03-02 MED ORDER — METOLAZONE 5 MG PO TABS
5.0000 mg | ORAL_TABLET | Freq: Two times a day (BID) | ORAL | Status: DC
  Administered 2015-03-02 – 2015-03-04 (×5): 5 mg via ORAL
  Filled 2015-03-02 (×8): qty 1

## 2015-03-02 MED ORDER — NOREPINEPHRINE BITARTRATE 1 MG/ML IV SOLN: 0.0000 ug/min | INTRAVENOUS | Status: DC

## 2015-03-02 MED ORDER — INSULIN ASPART 100 UNIT/ML ~~LOC~~ SOLN
0.0000 [IU] | Freq: Three times a day (TID) | SUBCUTANEOUS | Status: DC
  Administered 2015-03-03: 2 [IU] via SUBCUTANEOUS
  Administered 2015-03-03: 1 [IU] via SUBCUTANEOUS
  Administered 2015-03-03: 2 [IU] via SUBCUTANEOUS
  Administered 2015-03-04 – 2015-03-06 (×6): 1 [IU] via SUBCUTANEOUS

## 2015-03-02 MED ORDER — AMIODARONE HCL IN DEXTROSE 360-4.14 MG/200ML-% IV SOLN
30.0000 mg/h | INTRAVENOUS | Status: DC
  Administered 2015-03-02: 30 mg/h via INTRAVENOUS
  Administered 2015-03-03 – 2015-03-04 (×5): 60 mg/h via INTRAVENOUS
  Administered 2015-03-05 – 2015-03-06 (×2): 30 mg/h via INTRAVENOUS
  Filled 2015-03-02 (×23): qty 200

## 2015-03-02 MED ORDER — AMIODARONE HCL IN DEXTROSE 360-4.14 MG/200ML-% IV SOLN
60.0000 mg/h | INTRAVENOUS | Status: AC
  Administered 2015-03-02 (×2): 60 mg/h via INTRAVENOUS
  Filled 2015-03-02 (×3): qty 200

## 2015-03-02 MED ORDER — MILRINONE IN DEXTROSE 20 MG/100ML IV SOLN
0.1250 ug/kg/min | INTRAVENOUS | Status: DC
  Administered 2015-03-02 – 2015-03-04 (×2): 0.125 ug/kg/min via INTRAVENOUS
  Filled 2015-03-02 (×4): qty 100

## 2015-03-02 MED ORDER — LIDOCAINE HCL (PF) 1 % IJ SOLN
INTRAMUSCULAR | Status: AC
  Administered 2015-03-02: 5 mL
  Filled 2015-03-02: qty 5

## 2015-03-02 MED ORDER — NOREPINEPHRINE BITARTRATE 1 MG/ML IV SOLN
0.0000 ug/min | INTRAVENOUS | Status: DC
  Administered 2015-03-02: 18 ug/min via INTRAVENOUS
  Filled 2015-03-02: qty 16

## 2015-03-02 MED ORDER — AMIODARONE LOAD VIA INFUSION
150.0000 mg | Freq: Once | INTRAVENOUS | Status: AC
  Administered 2015-03-02: 150 mg via INTRAVENOUS
  Filled 2015-03-02: qty 83.34

## 2015-03-02 MED ORDER — FUROSEMIDE 10 MG/ML IJ SOLN
10.0000 mg/h | INTRAVENOUS | Status: DC
  Administered 2015-03-02: 10 mg/h via INTRAVENOUS
  Filled 2015-03-02: qty 25

## 2015-03-02 MED ORDER — SODIUM CHLORIDE 0.9 % IV SOLN
INTRAVENOUS | Status: DC | PRN
  Administered 2015-03-04 – 2015-03-05 (×2): via INTRA_ARTERIAL

## 2015-03-02 MED ORDER — AMIODARONE IV BOLUS ONLY 150 MG/100ML
150.0000 mg | Freq: Once | INTRAVENOUS | Status: DC
  Administered 2015-03-02: 150 mg via INTRAVENOUS

## 2015-03-02 MED ORDER — SODIUM POLYSTYRENE SULFONATE 15 GM/60ML PO SUSP
30.0000 g | Freq: Once | ORAL | Status: AC
  Administered 2015-03-02: 30 g via ORAL
  Filled 2015-03-02: qty 120

## 2015-03-02 MED ORDER — NOREPINEPHRINE BITARTRATE 1 MG/ML IV SOLN
0.0000 ug/min | INTRAVENOUS | Status: DC
  Administered 2015-03-02: 2.13 ug/min via INTRAVENOUS
  Filled 2015-03-02 (×2): qty 4

## 2015-03-02 MED ORDER — FUROSEMIDE 10 MG/ML IJ SOLN
120.0000 mg | Freq: Four times a day (QID) | INTRAVENOUS | Status: DC
  Administered 2015-03-02 – 2015-03-04 (×9): 120 mg via INTRAVENOUS
  Filled 2015-03-02 (×11): qty 12

## 2015-03-02 NOTE — Progress Notes (Signed)
CRITICAL VALUE ALERT  Critical value received:  Lactic Acid 4  Date of notification:  03/02/15  Time of notification:  0220  Critical value read back:yes  Nurse who received alert:  Shary Decamp, RN  MD notified (1st page): Yes  Time of first page:  0220  MD notified (2nd page):  Time of second page:  Responding MD:  Dr. Dorothea Glassman  Time MD responded: [phone]

## 2015-03-02 NOTE — Progress Notes (Signed)
ADVANCED HF TEAM CONSULT NOTE  69 y/o male with h/o presumed NICM (I cannot find any cath notes), DM2, CKD, PAF. We are asked to see due to severe HF.  Admitted overnight with recurrent HF with marked volume overload in setting of recurrent AFL. Had prolonged hospitalization in 2013 due to cariogenic shock and AF and developed life-threatening rectus sheath hematoma so he has not been anticoagulated.  Overnight he was hypotensive with lactate 4.0. Central line placed and started on levophed and lasix gtt. This am SBP improved but diuresis quite poor. Remains in AFL with RVR. Echo pending. Co-ox 39%-> 44%. Cr 1.58 -> 2.5. CVP 20. (checkedd personally)  Past Medical History  Diagnosis Date  . Essential hypertension, benign   . Atrial fibrillation   . Cardiomyopathy     Transient cardiomyopathy with EF 30-35% 03/27/12, improved to 55-60% 04/16/12  . Acute renal failure     03/2012 (not on ACEI due to this) - renal US unremarkable  . Rectus sheath hematoma     Spontaneous during 03/2012 hospitalization managed conservatively with blood products  . Respiratory failure     VDRF 03/2012 in setting of CHF  . Acute blood loss anemia     Due to rectus sheath hematoma 03/2012  . Unspecified sleep apnea     Suspected during 03/2012 hospitalization   . Hematuria 03/2012  . Hyperglycemia 03/2012  . Shock     03/2012 hospitalization - Cardiogenic shock requiring pressors initially, followed by hemorrhagic shock later in hospitalization  . Pleural effusion 03/2012  . CHF (congestive heart failure)    . furosemide  120 mg Intravenous Q6H  . heparin  5,000 Units Subcutaneous 3 times per day  . metolazone  5 mg Oral BID  . sodium chloride  3 mL Intravenous Q12H   History   Social History  . Marital Status: Married    Spouse Name: N/A  . Number of Children: N/A  . Years of Education: N/A   Social History Main Topics  . Smoking status: Never Smoker   . Smokeless tobacco: Not on file  . Alcohol Use:  Yes     Comment: former  . Drug Use: No  . Sexual Activity: Yes   Other Topics Concern  . None   Social History Narrative   Family Hx: Denies h/o familial CM or SCD   On exam  Ill appearing JVP up to ear. [facility] TLC Cor: Regular tachy +s3 Lungs: Crackles 1/3 up Ab: markedly distended. NT  Focal lesion in LUQ Ext: 3+ edema into thighs. Cool  Results for orders placed or performed during the hospital encounter of 03/01/15 (from the past 72 hour(s))  CBC with Differential     Status: None   Collection Time: 03/01/15 10:10 AM  Result Value Ref Range   WBC 6.1 4.0 - 10.5 K/uL   RBC 5.29 4.22 - 5.81 MIL/uL   Hemoglobin 15.8 13.0 - 17.0 g/dL   HCT 74.7 34.0 - 37.0 %   MCV 92.6 78.0 - 100.0 fL   MCH 29.9 26.0 - 34.0 pg   MCHC 32.2 30.0 - 36.0 g/dL   RDW 96.4 38.3 - 81.8 %   Platelets 191 150 - 400 K/uL   Neutrophils Relative % 66 43 - 77 %   Neutro Abs 4.0 1.7 - 7.7 K/uL   Lymphocytes Relative 25 12 - 46 %   Lymphs Abs 1.5 0.7 - 4.0 K/uL   Monocytes Relative 8 3 - 12 %  Monocytes Absolute 0.5 0.1 - 1.0 K/uL   Eosinophils Relative 1 0 - 5 %   Eosinophils Absolute 0.0 0.0 - 0.7 K/uL   Basophils Relative 0 0 - 1 %   Basophils Absolute 0.0 0.0 - 0.1 K/uL  Basic metabolic panel     Status: Abnormal   Collection Time: 03/01/15 10:10 AM  Result Value Ref Range   Sodium 141 135 - 145 mmol/L   Potassium 4.5 3.5 - 5.1 mmol/L   Chloride 106 96 - 112 mmol/L   CO2 24 19 - 32 mmol/L   Glucose, Bld 172 (H) 70 - 99 mg/dL   BUN 25 (H) 6 - 23 mg/dL   Creatinine, Ser 1.58 (H) 0.50 - 1.35 mg/dL   Calcium 9.2 8.4 - 10.5 mg/dL   GFR calc non Af Amer 44 (L) >90 mL/min   GFR calc Af Amer 51 (L) >90 mL/min    Comment: (NOTE) The eGFR has been calculated using the CKD EPI equation. This calculation has not been validated in all clinical situations. eGFR's persistently <90 mL/min signify possible Chronic Kidney Disease.    Anion gap 11 5 - 15  Brain natriuretic peptide     Status: Abnormal    Collection Time: 03/01/15 10:10 AM  Result Value Ref Range   B Natriuretic Peptide 577.0 (H) 0.0 - 100.0 pg/mL  Troponin I     Status: Abnormal   Collection Time: 03/01/15 10:10 AM  Result Value Ref Range   Troponin I 0.13 (H) <0.031 ng/mL    Comment:        PERSISTENTLY INCREASED TROPONIN VALUES IN THE RANGE OF 0.04-0.49 ng/mL CAN BE SEEN IN:       -UNSTABLE ANGINA       -CONGESTIVE HEART FAILURE       -MYOCARDITIS       -CHEST TRAUMA       -ARRYHTHMIAS       -LATE PRESENTING MYOCARDIAL INFARCTION       -COPD   CLINICAL FOLLOW-UP RECOMMENDED.   I-Stat CG4 Lactic Acid, ED     Status: Abnormal   Collection Time: 03/01/15 10:59 AM  Result Value Ref Range   Lactic Acid, Venous 4.08 (HH) 0.5 - 2.0 mmol/L   Comment NOTIFIED PHYSICIAN   Blood culture (routine x 2)     Status: None (Preliminary result)   Collection Time: 03/01/15 11:23 AM  Result Value Ref Range   Specimen Description LEFT ANTECUBITAL    Special Requests BOTTLES DRAWN AEROBIC AND ANAEROBIC 6CC    Culture NO GROWTH 1 DAY    Report Status PENDING   Blood culture (routine x 2)     Status: None (Preliminary result)   Collection Time: 03/01/15 11:26 AM  Result Value Ref Range   Specimen Description RIGHT ANTECUBITAL    Special Requests BOTTLES DRAWN AEROBIC AND ANAEROBIC 6CC    Culture NO GROWTH 1 DAY    Report Status PENDING   I-Stat CG4 Lactic Acid, ED     Status: Abnormal   Collection Time: 03/01/15  2:35 PM  Result Value Ref Range   Lactic Acid, Venous 2.90 (HH) 0.5 - 2.0 mmol/L  MRSA PCR Screening     Status: None   Collection Time: 03/01/15  4:53 PM  Result Value Ref Range   MRSA by PCR NEGATIVE NEGATIVE    Comment:        The GeneXpert MRSA Assay (FDA approved for NASAL specimens only), is one component of a comprehensive MRSA colonization  surveillance program. It is not intended to diagnose MRSA infection nor to guide or monitor treatment for MRSA infections.   Lactic acid, plasma     Status:  Abnormal   Collection Time: 03/01/15  6:24 PM  Result Value Ref Range   Lactic Acid, Venous 2.4 (HH) 0.5 - 2.0 mmol/L    Comment: REPEATED TO VERIFY CRITICAL RESULT CALLED TO, READ BACK BY AND VERIFIED WITH: ABDUSSALAAM K,RN 03/01/15 2226 WAYK   Lactic acid, plasma     Status: Abnormal   Collection Time: 03/01/15  8:42 PM  Result Value Ref Range   Lactic Acid, Venous 2.8 (HH) 0.5 - 2.0 mmol/L    Comment: REPEATED TO VERIFY CRITICAL RESULT CALLED TO, READ BACK BY AND VERIFIED WITH: ABDUSSALAAM K,RN 03/02/15 0052 WAYK   Blood gas, venous     Status: Abnormal   Collection Time: 03/01/15 11:10 PM  Result Value Ref Range   FIO2 0.21 %   pH, Ven 7.357 (H) 7.250 - 7.300   pCO2, Ven 39.6 (L) 45.0 - 50.0 mmHg   pO2, Ven 58.1 (H) 30.0 - 45.0 mmHg   Bicarbonate 21.7 20.0 - 24.0 mEq/L   TCO2 22.9 0 - 100 mmol/L   Acid-base deficit 3.0 (H) 0.0 - 2.0 mmol/L   O2 Saturation 85.9 %   Patient temperature 98.6    Collection site VEIN    Drawn by LAB    Sample type VENOUS   CBC WITH DIFFERENTIAL     Status: Abnormal   Collection Time: 03/01/15 11:10 PM  Result Value Ref Range   WBC 7.2 4.0 - 10.5 K/uL   RBC 5.24 4.22 - 5.81 MIL/uL   Hemoglobin 15.5 13.0 - 17.0 g/dL   HCT 47.4 39.0 - 52.0 %   MCV 90.5 78.0 - 100.0 fL   MCH 29.6 26.0 - 34.0 pg   MCHC 32.7 30.0 - 36.0 g/dL   RDW 14.3 11.5 - 15.5 %   Platelets 138 (L) 150 - 400 K/uL   Neutrophils Relative % 74 43 - 77 %   Neutro Abs 5.3 1.7 - 7.7 K/uL   Lymphocytes Relative 16 12 - 46 %   Lymphs Abs 1.2 0.7 - 4.0 K/uL   Monocytes Relative 9 3 - 12 %   Monocytes Absolute 0.7 0.1 - 1.0 K/uL   Eosinophils Relative 0 0 - 5 %   Eosinophils Absolute 0.0 0.0 - 0.7 K/uL   Basophils Relative 0 0 - 1 %   Basophils Absolute 0.0 0.0 - 0.1 K/uL  Comprehensive metabolic panel     Status: Abnormal   Collection Time: 03/01/15 11:10 PM  Result Value Ref Range   Sodium 137 135 - 145 mmol/L   Potassium 5.0 3.5 - 5.1 mmol/L   Chloride 105 96 - 112  mmol/L   CO2 25 19 - 32 mmol/L   Glucose, Bld 131 (H) 70 - 99 mg/dL   BUN 30 (H) 6 - 23 mg/dL   Creatinine, Ser 2.11 (H) 0.50 - 1.35 mg/dL   Calcium 8.8 8.4 - 10.5 mg/dL   Total Protein 6.5 6.0 - 8.3 g/dL   Albumin 3.2 (L) 3.5 - 5.2 g/dL   AST 38 (H) 0 - 37 U/L   ALT 35 0 - 53 U/L   Alkaline Phosphatase 66 39 - 117 U/L   Total Bilirubin 0.6 0.3 - 1.2 mg/dL   GFR calc non Af Amer 31 (L) >90 mL/min   GFR calc Af Amer 36 (L) >90 mL/min  Comment: (NOTE) The eGFR has been calculated using the CKD EPI equation. This calculation has not been validated in all clinical situations. eGFR's persistently <90 mL/min signify possible Chronic Kidney Disease.    Anion gap 7 5 - 15  Protime-INR     Status: None   Collection Time: 03/01/15 11:10 PM  Result Value Ref Range   Prothrombin Time 14.3 11.6 - 15.2 seconds   INR 1.10 0.00 - 1.49  Magnesium     Status: None   Collection Time: 03/01/15 11:10 PM  Result Value Ref Range   Magnesium 2.0 1.5 - 2.5 mg/dL  Brain natriuretic peptide     Status: Abnormal   Collection Time: 03/01/15 11:10 PM  Result Value Ref Range   B Natriuretic Peptide 693.5 (H) 0.0 - 100.0 pg/mL  TSH     Status: None   Collection Time: 03/01/15 11:10 PM  Result Value Ref Range   TSH 1.231 0.350 - 4.500 uIU/mL  Troponin I     Status: Abnormal   Collection Time: 03/01/15 11:10 PM  Result Value Ref Range   Troponin I 0.10 (H) <0.031 ng/mL    Comment:        PERSISTENTLY INCREASED TROPONIN VALUES IN THE RANGE OF 0.04-0.49 ng/mL CAN BE SEEN IN:       -UNSTABLE ANGINA       -CONGESTIVE HEART FAILURE       -MYOCARDITIS       -CHEST TRAUMA       -ARRYHTHMIAS       -LATE PRESENTING MYOCARDIAL INFARCTION       -COPD   CLINICAL FOLLOW-UP RECOMMENDED.   Glucose, capillary     Status: Abnormal   Collection Time: 03/01/15 11:52 PM  Result Value Ref Range   Glucose-Capillary 161 (H) 70 - 99 mg/dL  Lactic acid, plasma     Status: Abnormal   Collection Time: 03/02/15  12:35 AM  Result Value Ref Range   Lactic Acid, Venous 4.0 (HH) 0.5 - 2.0 mmol/L    Comment: REPEATED TO VERIFY CRITICAL RESULT CALLED TO, READ BACK BY AND VERIFIED WITH: PRIVETTE S,RN 03/02/15 0210 WAYK   Lactic acid, plasma     Status: Abnormal   Collection Time: 03/02/15  3:20 AM  Result Value Ref Range   Lactic Acid, Venous 2.3 (HH) 0.5 - 2.0 mmol/L    Comment: REPEATED TO VERIFY CRITICAL RESULT CALLED TO, READ BACK BY AND VERIFIED WITH: L SHORT,RN [id number] WILDERK   Troponin I     Status: Abnormal   Collection Time: 03/02/15  3:20 AM  Result Value Ref Range   Troponin I 0.10 (H) <0.031 ng/mL  Carboxyhemoglobin     Status: Abnormal   Collection Time: 03/02/15  4:32 AM  Result Value Ref Range   Total hemoglobin 15.9 13.5 - 18.0 g/dL   O2 Saturation 39.7 %   Carboxyhemoglobin 0.4 (L) 0.5 - 1.5 %   Methemoglobin 0.9 0.0 - 1.5 %  Basic metabolic panel     Status: Abnormal   Collection Time: 03/02/15  5:16 AM  Result Value Ref Range   Sodium 137 135 - 145 mmol/L   Potassium 5.4 (H) 3.5 - 5.1 mmol/L   Chloride 107 96 - 112 mmol/L   CO2 19 19 - 32 mmol/L   Glucose, Bld 143 (H) 70 - 99 mg/dL   BUN 34 (H) 6 - 23 mg/dL   Creatinine, Ser 2.52 (H) 0.50 - 1.35 mg/dL   Calcium 8.8 8.4 - 10.5  mg/dL   GFR calc non Af Amer 25 (L) >90 mL/min   GFR calc Af Amer 29 (L) >90 mL/min    Comment: (NOTE) The eGFR has been calculated using the CKD EPI equation. This calculation has not been validated in all clinical situations. eGFR's persistently <90 mL/min signify possible Chronic Kidney Disease.    Anion gap 11 5 - 15  Troponin I     Status: Abnormal   Collection Time: 03/02/15  5:16 AM  Result Value Ref Range   Troponin I 0.09 (H) <0.031 ng/mL    Comment:        PERSISTENTLY INCREASED TROPONIN VALUES IN THE RANGE OF 0.04-0.49 ng/mL CAN BE SEEN IN:       -UNSTABLE ANGINA       -CONGESTIVE HEART FAILURE       -MYOCARDITIS       -CHEST TRAUMA       -ARRYHTHMIAS       -LATE  PRESENTING MYOCARDIAL INFARCTION       -COPD   CLINICAL FOLLOW-UP RECOMMENDED.   Carboxyhemoglobin     Status: Abnormal   Collection Time: 03/02/15  9:18 AM  Result Value Ref Range   Total hemoglobin 15.3 13.5 - 18.0 g/dL   O2 Saturation 44.8 %   Carboxyhemoglobin 0.3 (L) 0.5 - 1.5 %   Methemoglobin 0.8 0.0 - 1.5 %    Assessment:  1. Cardiogenic shock 2. Acute on chronic systolic HF with probable severe biventricular failure (echo pending)    --suspect tachy induced 3. Acute on chronic renal failure stage 3    --likely due to ATN 4. Atrial flutter    --not on anticoag due to previous rectus sheath hematoma 2013 5. DM2 6. Hyperkalemia  Plan/discussion:   Suspect he has severe tachy-induced CM with cardiogenic shock physiology and marked volume. Worsening renal function likely due to low output HF and ATN. Will continue levophed support, add low-dose milrinone. Start amiodarone. Change lasix to 120 IV q6. Add metolazone. Will likely need CVVHD.   The patient is critically ill with multiple organ systems failure and requires high complexity decision making for assessment and support, frequent evaluation and titration of therapies, application of advanced monitoring technologies and extensive interpretation of multiple databases.   Critical Care Time devoted to patient care services described in this note is 35 Minutes.  Keyonna Comunale,MD 10:36 AM

## 2015-03-02 NOTE — Progress Notes (Signed)
Subjective:  Admitted yesterday with CGS and atrial tach. Breathing improving . No CP  Objective:  Temp:  [96.1 F (35.6 C)-97.9 F (36.6 C)] 97.6 F (36.4 C) (03/07 0753) Pulse Rate:  [51-169] 73 (03/07 0753) Resp:  [18-37] 25 (03/07 0753) BP: (78-152)/(25-135) 123/97 mmHg (03/07 0753) SpO2:  [90 %-100 %] 100 % (03/07 0753) Weight:  [209 lb 10.5 oz (95.1 kg)-212 lb 15.4 oz (96.6 kg)] 212 lb 15.4 oz (96.6 kg) (03/07 0125) Weight change:   Intake/Output from previous day: 03/06 0701 - 03/07 0700 In: 92.5 [I.V.:92.5] Out: 775 [Urine:775]  Intake/Output from this shift: Total I/O In: 28.8 [I.V.:28.8] Out: -   Physical Exam: General appearance: alert and no distress Neck: no adenopathy, no carotid bruit, no JVD, supple, symmetrical, trachea midline and thyroid not enlarged, symmetric, no tenderness/mass/nodules Lungs: Decreased BS bilaterally Heart: techy Abdomen: Protuberant, mass right of midline Extremities: 2+ edema  Lab Results: Results for orders placed or performed during the hospital encounter of 03/01/15 (from the past 48 hour(s))  CBC with Differential     Status: None   Collection Time: 03/01/15 10:10 AM  Result Value Ref Range   WBC 6.1 4.0 - 10.5 K/uL   RBC 5.29 4.22 - 5.81 MIL/uL   Hemoglobin 15.8 13.0 - 17.0 g/dL   HCT 49.0 39.0 - 52.0 %   MCV 92.6 78.0 - 100.0 fL   MCH 29.9 26.0 - 34.0 pg   MCHC 32.2 30.0 - 36.0 g/dL   RDW 14.2 11.5 - 15.5 %   Platelets 191 150 - 400 K/uL   Neutrophils Relative % 66 43 - 77 %   Neutro Abs 4.0 1.7 - 7.7 K/uL   Lymphocytes Relative 25 12 - 46 %   Lymphs Abs 1.5 0.7 - 4.0 K/uL   Monocytes Relative 8 3 - 12 %   Monocytes Absolute 0.5 0.1 - 1.0 K/uL   Eosinophils Relative 1 0 - 5 %   Eosinophils Absolute 0.0 0.0 - 0.7 K/uL   Basophils Relative 0 0 - 1 %   Basophils Absolute 0.0 0.0 - 0.1 K/uL  Basic metabolic panel     Status: Abnormal   Collection Time: 03/01/15 10:10 AM  Result Value Ref Range   Sodium 141  135 - 145 mmol/L   Potassium 4.5 3.5 - 5.1 mmol/L   Chloride 106 96 - 112 mmol/L   CO2 24 19 - 32 mmol/L   Glucose, Bld 172 (H) 70 - 99 mg/dL   BUN 25 (H) 6 - 23 mg/dL   Creatinine, Ser 1.58 (H) 0.50 - 1.35 mg/dL   Calcium 9.2 8.4 - 10.5 mg/dL   GFR calc non Af Amer 44 (L) >90 mL/min   GFR calc Af Amer 51 (L) >90 mL/min    Comment: (NOTE) The eGFR has been calculated using the CKD EPI equation. This calculation has not been validated in all clinical situations. eGFR's persistently <90 mL/min signify possible Chronic Kidney Disease.    Anion gap 11 5 - 15  Brain natriuretic peptide     Status: Abnormal   Collection Time: 03/01/15 10:10 AM  Result Value Ref Range   B Natriuretic Peptide 577.0 (H) 0.0 - 100.0 pg/mL  Troponin I     Status: Abnormal   Collection Time: 03/01/15 10:10 AM  Result Value Ref Range   Troponin I 0.13 (H) <0.031 ng/mL    Comment:        PERSISTENTLY INCREASED TROPONIN VALUES IN THE RANGE  OF 0.04-0.49 ng/mL CAN BE SEEN IN:       -UNSTABLE ANGINA       -CONGESTIVE HEART FAILURE       -MYOCARDITIS       -CHEST TRAUMA       -ARRYHTHMIAS       -LATE PRESENTING MYOCARDIAL INFARCTION       -COPD   CLINICAL FOLLOW-UP RECOMMENDED.   I-Stat CG4 Lactic Acid, ED     Status: Abnormal   Collection Time: 03/01/15 10:59 AM  Result Value Ref Range   Lactic Acid, Venous 4.08 (HH) 0.5 - 2.0 mmol/L   Comment NOTIFIED PHYSICIAN   I-Stat CG4 Lactic Acid, ED     Status: Abnormal   Collection Time: 03/01/15  2:35 PM  Result Value Ref Range   Lactic Acid, Venous 2.90 (HH) 0.5 - 2.0 mmol/L  MRSA PCR Screening     Status: None   Collection Time: 03/01/15  4:53 PM  Result Value Ref Range   MRSA by PCR NEGATIVE NEGATIVE    Comment:        The GeneXpert MRSA Assay (FDA approved for NASAL specimens only), is one component of a comprehensive MRSA colonization surveillance program. It is not intended to diagnose MRSA infection nor to guide or monitor treatment for MRSA  infections.   Lactic acid, plasma     Status: Abnormal   Collection Time: 03/01/15  6:24 PM  Result Value Ref Range   Lactic Acid, Venous 2.4 (HH) 0.5 - 2.0 mmol/L    Comment: REPEATED TO VERIFY CRITICAL RESULT CALLED TO, READ BACK BY AND VERIFIED WITH: ABDUSSALAAM K,RN 03/01/15 2226 WAYK   Lactic acid, plasma     Status: Abnormal   Collection Time: 03/01/15  8:42 PM  Result Value Ref Range   Lactic Acid, Venous 2.8 (HH) 0.5 - 2.0 mmol/L    Comment: REPEATED TO VERIFY CRITICAL RESULT CALLED TO, READ BACK BY AND VERIFIED WITH: ABDUSSALAAM K,RN 03/02/15 0052 WAYK   Blood gas, venous     Status: Abnormal   Collection Time: 03/01/15 11:10 PM  Result Value Ref Range   FIO2 0.21 %   pH, Ven 7.357 (H) 7.250 - 7.300   pCO2, Ven 39.6 (L) 45.0 - 50.0 mmHg   pO2, Ven 58.1 (H) 30.0 - 45.0 mmHg   Bicarbonate 21.7 20.0 - 24.0 mEq/L   TCO2 22.9 0 - 100 mmol/L   Acid-base deficit 3.0 (H) 0.0 - 2.0 mmol/L   O2 Saturation 85.9 %   Patient temperature 98.6    Collection site VEIN    Drawn by LAB    Sample type VENOUS   CBC WITH DIFFERENTIAL     Status: Abnormal   Collection Time: 03/01/15 11:10 PM  Result Value Ref Range   WBC 7.2 4.0 - 10.5 K/uL   RBC 5.24 4.22 - 5.81 MIL/uL   Hemoglobin 15.5 13.0 - 17.0 g/dL   HCT 47.4 39.0 - 52.0 %   MCV 90.5 78.0 - 100.0 fL   MCH 29.6 26.0 - 34.0 pg   MCHC 32.7 30.0 - 36.0 g/dL   RDW 14.3 11.5 - 15.5 %   Platelets 138 (L) 150 - 400 K/uL   Neutrophils Relative % 74 43 - 77 %   Neutro Abs 5.3 1.7 - 7.7 K/uL   Lymphocytes Relative 16 12 - 46 %   Lymphs Abs 1.2 0.7 - 4.0 K/uL   Monocytes Relative 9 3 - 12 %   Monocytes Absolute 0.7 0.1 -  1.0 K/uL   Eosinophils Relative 0 0 - 5 %   Eosinophils Absolute 0.0 0.0 - 0.7 K/uL   Basophils Relative 0 0 - 1 %   Basophils Absolute 0.0 0.0 - 0.1 K/uL  Comprehensive metabolic panel     Status: Abnormal   Collection Time: 03/01/15 11:10 PM  Result Value Ref Range   Sodium 137 135 - 145 mmol/L   Potassium  5.0 3.5 - 5.1 mmol/L   Chloride 105 96 - 112 mmol/L   CO2 25 19 - 32 mmol/L   Glucose, Bld 131 (H) 70 - 99 mg/dL   BUN 30 (H) 6 - 23 mg/dL   Creatinine, Ser 6.61 (H) 0.50 - 1.35 mg/dL   Calcium 8.8 8.4 - 66.6 mg/dL   Total Protein 6.5 6.0 - 8.3 g/dL   Albumin 3.2 (L) 3.5 - 5.2 g/dL   AST 38 (H) 0 - 37 U/L   ALT 35 0 - 53 U/L   Alkaline Phosphatase 66 39 - 117 U/L   Total Bilirubin 0.6 0.3 - 1.2 mg/dL   GFR calc non Af Amer 31 (L) >90 mL/min   GFR calc Af Amer 36 (L) >90 mL/min    Comment: (NOTE) The eGFR has been calculated using the CKD EPI equation. This calculation has not been validated in all clinical situations. eGFR's persistently <90 mL/min signify possible Chronic Kidney Disease.    Anion gap 7 5 - 15  Protime-INR     Status: None   Collection Time: 03/01/15 11:10 PM  Result Value Ref Range   Prothrombin Time 14.3 11.6 - 15.2 seconds   INR 1.10 0.00 - 1.49  Magnesium     Status: None   Collection Time: 03/01/15 11:10 PM  Result Value Ref Range   Magnesium 2.0 1.5 - 2.5 mg/dL  Brain natriuretic peptide     Status: Abnormal   Collection Time: 03/01/15 11:10 PM  Result Value Ref Range   B Natriuretic Peptide 693.5 (H) 0.0 - 100.0 pg/mL  TSH     Status: None   Collection Time: 03/01/15 11:10 PM  Result Value Ref Range   TSH 1.231 0.350 - 4.500 uIU/mL  Troponin I     Status: Abnormal   Collection Time: 03/01/15 11:10 PM  Result Value Ref Range   Troponin I 0.10 (H) <0.031 ng/mL    Comment:        PERSISTENTLY INCREASED TROPONIN VALUES IN THE RANGE OF 0.04-0.49 ng/mL CAN BE SEEN IN:       -UNSTABLE ANGINA       -CONGESTIVE HEART FAILURE       -MYOCARDITIS       -CHEST TRAUMA       -ARRYHTHMIAS       -LATE PRESENTING MYOCARDIAL INFARCTION       -COPD   CLINICAL FOLLOW-UP RECOMMENDED.   Glucose, capillary     Status: Abnormal   Collection Time: 03/01/15 11:52 PM  Result Value Ref Range   Glucose-Capillary 161 (H) 70 - 99 mg/dL  Lactic acid, plasma      Status: Abnormal   Collection Time: 03/02/15 12:35 AM  Result Value Ref Range   Lactic Acid, Venous 4.0 (HH) 0.5 - 2.0 mmol/L    Comment: REPEATED TO VERIFY CRITICAL RESULT CALLED TO, READ BACK BY AND VERIFIED WITH: PRIVETTE S,RN 03/02/15 0210 WAYK   Lactic acid, plasma     Status: Abnormal   Collection Time: 03/02/15  3:20 AM  Result Value Ref Range  Lactic Acid, Venous 2.3 (HH) 0.5 - 2.0 mmol/L    Comment: REPEATED TO VERIFY CRITICAL RESULT CALLED TO, READ BACK BY AND VERIFIED WITH: L SHORT,RN [id number] WILDERK   Troponin I     Status: Abnormal   Collection Time: 03/02/15  3:20 AM  Result Value Ref Range   Troponin I 0.10 (H) <0.031 ng/mL  Carboxyhemoglobin     Status: Abnormal   Collection Time: 03/02/15  4:32 AM  Result Value Ref Range   Total hemoglobin 15.9 13.5 - 18.0 g/dL   O2 Saturation 39.7 %   Carboxyhemoglobin 0.4 (L) 0.5 - 1.5 %   Methemoglobin 0.9 0.0 - 1.5 %  Basic metabolic panel     Status: Abnormal   Collection Time: 03/02/15  5:16 AM  Result Value Ref Range   Sodium 137 135 - 145 mmol/L   Potassium 5.4 (H) 3.5 - 5.1 mmol/L   Chloride 107 96 - 112 mmol/L   CO2 19 19 - 32 mmol/L   Glucose, Bld 143 (H) 70 - 99 mg/dL   BUN 34 (H) 6 - 23 mg/dL   Creatinine, Ser 2.52 (H) 0.50 - 1.35 mg/dL   Calcium 8.8 8.4 - 10.5 mg/dL   GFR calc non Af Amer 25 (L) >90 mL/min   GFR calc Af Amer 29 (L) >90 mL/min    Comment: (NOTE) The eGFR has been calculated using the CKD EPI equation. This calculation has not been validated in all clinical situations. eGFR's persistently <90 mL/min signify possible Chronic Kidney Disease.    Anion gap 11 5 - 15  Troponin I     Status: Abnormal   Collection Time: 03/02/15  5:16 AM  Result Value Ref Range   Troponin I 0.09 (H) <0.031 ng/mL    Comment:        PERSISTENTLY INCREASED TROPONIN VALUES IN THE RANGE OF 0.04-0.49 ng/mL CAN BE SEEN IN:       -UNSTABLE ANGINA       -CONGESTIVE HEART FAILURE       -MYOCARDITIS        -CHEST TRAUMA       -ARRYHTHMIAS       -LATE PRESENTING MYOCARDIAL INFARCTION       -COPD   CLINICAL FOLLOW-UP RECOMMENDED.     Imaging: Imaging results have been reviewed  Tele- Atrial tach at 140-150  Assessment/Plan:   1. Active Problems: 2.   CHF exacerbation 3. H/O Afib 4. H/O CGS 5. Medication non compliance 6.   Time Spent Directly with Patient:  20 minutes  Length of Stay:  LOS: 1 day   1. CHF/CGS- Pt admitted after increasing SOB and BLE edema over past 2 weeks. Prior H/O CHF/CGS. Hasn't seen Dr. Domenic Polite win 3 years and has stopped taking meds. He was hypotensive and Tx 2H, put on pressors and lasix drip. BP better. Serum lactate decreasing. Sats 100% on 2L. CXR shows large cardiac silhouette with prob right pleaural effusion.  I/O - 700 cc. Scr increasing 1.6---> 2.1---> 2.5. 2D pending. Will prob need pressors (milrinone, HR already high to start dobut). Will discuss with CHF service.  2. Atrial Tach- Restart iv Amio. No BB now because of CGS on pressors and Acute CHF. Abd [place] pending prior to starting anticoag.  Lorretta Harp 03/02/2015, 8:41 AM

## 2015-03-02 NOTE — Progress Notes (Signed)
CRITICAL VALUE ALERT  Critical value received:  Lactic acid 2.8  Date of notification:  03/02/2015  Time of notification:  0100  Critical value read back:Yes  Nurse who received alert:  Julious Oka RN  MD notified (1st page):  Dr. Lurene Shadow verbally updated  Time of first page:    MD notified (2nd page):  Time of second page:  Responding MD:   Time MD responded:

## 2015-03-02 NOTE — Procedures (Signed)
Central Venous Catheter Insertion Procedure Note Martin [name] [id number] 1946/10/04  Procedure: Insertion of Central Venous Catheter Indications: Assessment of intravascular volume, Drug and/or fluid administration and Frequent blood sampling  Procedure Details Consent: Risks of procedure as well as the alternatives and risks of each were explained to the (patient/caregiver).  Consent for procedure obtained. Time Out: Verified patient identification, verified procedure, site/side was marked, verified correct patient position, special equipment/implants available, medications/allergies/relevent history reviewed, required imaging and test results available.  Performed  Maximum sterile technique was used including antiseptics, cap, gloves, gown, hand hygiene, mask and sheet. Skin prep: Chlorhexidine; local anesthetic administered A antimicrobial bonded/coated triple lumen catheter was placed in the left internal jugular vein using the Seldinger technique under ultrasound guidance. An approach to the left subclavian was unsuccessful x3 attempts.  Evaluation Blood flow good Complications: No apparent complications Patient did tolerate procedure well. Chest X-ray ordered to verify placement.  CXR: pending.  Alailah Safley 03/02/2015, 2:12 AM

## 2015-03-02 NOTE — Care Management Note (Addendum)
    Page 1 of 1   03/08/2015     8:50:24 AM CARE MANAGEMENT NOTE 03/08/2015  Patient:  Martin [name], Martin [name]   Account Number:  [id number]  Date Initiated:  03/02/2015  Documentation initiated by:  Elissa Hefty  Subjective/Objective Assessment:   adm w heart failure, at tach     Action/Plan:   lives w wife   Anticipated DC Date:     Anticipated DC Plan:  [place]  CM consult  Medication Assistance      Choice offered to / List presented to:             Status of service:  Completed, signed off Medicare Important Message given?  YES (If response is "NO", the following Medicare IM given date fields will be blank) Date Medicare IM given:  03/04/2015 Medicare IM given by:  Elissa Hefty Date Additional Medicare IM given:   Additional Medicare IM given by:    Discharge Disposition:    Per UR Regulation:  Reviewed for med. necessity/level of care/duration of stay  If discussed at [place] of Stay Meetings, dates discussed:    Comments:  03/07/15 CM met with pt's wife in room to confirm pt has no MEDICATION PLAN. is giving wife Roby pamphlet to pursue insurance with [employer] which includes a Scientist, research (physical sciences). CM retrieved medications from internal pharmacy and delivered to room.  No other CM needs were communicated. Mariane Masters,  BSN, CM [phone].  In [place] asked if pt used the [employer] plan and family states NO.  Pharmacy willing to fill three prescriptions for one week (one time charity effort).  Cm explained to MD who states his HF office staff will pursue free or charity assistance at follow up.  CM  3/11 1046 debbie dowell rn,bsn cm sec ck to see for copay and if prior auth req for eliquis and ranexa. will give pt 30day free eliquis card.pt states does have medicare d plan. 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

## 2015-03-02 NOTE — Progress Notes (Signed)
  Echocardiogram 2D Echocardiogram has been performed.  Delcie Roch 03/02/2015, 3:12 PM

## 2015-03-02 NOTE — Progress Notes (Signed)
Pt has had no urine output since 1200. Bladder scan done which found 50ml. Dr. Gala Romney notified.

## 2015-03-02 NOTE — Consult Note (Signed)
Reason for Consult: Acute renal failure on chronic kidney disease stage III Referring Physician: [name] M.D. (cardiology)  HPI: 69 year old African-American man with past medical history significant for nonischemic cardiomyopathy, history of VDRF/CHF exacerbation/acute renal failure in April 2013 and what appears to be baseline chronic kidney disease stage III (baseline creatinine seemingly ranging 1.3-1.5). Presented to [facility] yesterday with progressively worsening shortness of breath that was worse with exertion/laying flat as well as increased pedal edema. He has not taken furosemide (or any other regular medications for that matter) for the past 1 year and has also not followed up with any of his medical providers.  He was admitted for management of his volume overload and what appears to be cardiogenic shock. Concern is raised with his marginal urine output in spite of aggressive diuretic therapy as well as his rising creatinine/hyperkalemia.    04/23/2012  05/24/2012  11/30/2012  03/01/2015  03/01/2015  03/02/2015   BUN 35 (H) (H) 30 (H) 34 (H)  Creatinine 1.68 (H) 1.28 1.25 1.58 (H) 2.11 (H) 2.52 (H)    Past Medical History  Diagnosis Date  . Essential hypertension, benign   . Atrial fibrillation   . Cardiomyopathy     Transient cardiomyopathy with EF 30-35% 03/27/12, improved to 55-60% 04/16/12  . Acute renal failure     03/2012 (not on ACEI due to this) - renal US unremarkable  . Rectus sheath hematoma     Spontaneous during 03/2012 hospitalization managed conservatively with blood products  . Respiratory failure     VDRF 03/2012 in setting of CHF  . Acute blood loss anemia     Due to rectus sheath hematoma 03/2012  . Unspecified sleep apnea     Suspected during 03/2012 hospitalization   . Hematuria 03/2012  . Hyperglycemia 03/2012  . Shock     03/2012 hospitalization - Cardiogenic shock requiring pressors initially, followed by hemorrhagic shock later in  hospitalization  . Pleural effusion 03/2012  . CHF (congestive heart failure)     History reviewed. No pertinent past surgical history.  No family history on file.  Social History:  reports that he has never smoked. He does not have any smokeless tobacco history on file. He reports that he drinks alcohol. He reports that he does not use illicit drugs.  Allergies: No Known Allergies  Medications:  Scheduled: . furosemide  120 mg Intravenous Q6H  . heparin  5,000 Units Subcutaneous 3 times per day  . metolazone  5 mg Oral BID  . sodium chloride  3 mL Intravenous Q12H    BMP Latest Ref Rng 03/02/2015 03/01/2015 03/01/2015  Glucose 70 - 99 mg/dL 161(W) 960(A) 540(J)  BUN 6 - 23 mg/dL 81(X) 91(Y) 78(G)  Creatinine 0.50 - 1.35 mg/dL 9.56(O) 1.30(Q) 6.57(Q)  Sodium 135 - 145 mmol/L 137 137 141  Potassium 3.5 - 5.1 mmol/L 5.4(H) 5.0 4.5  Chloride 96 - 112 mmol/L 107 105 106  CO2 19 - 32 mmol/L Calcium 8.4 - 10.5 mg/dL 8.8 8.8 9.2   CBC Latest Ref Rng 03/01/2015 03/01/2015 04/17/2012  WBC 4.0 - 10.5 K/uL 7.2 6.1 13.8(H)  Hemoglobin 13.0 - 17.0 g/dL 46.9 62.9 10.0(L)  Hematocrit 39.0 - 52.0 % 47.4 49.0 30.7(L)  Platelets 150 - 400 K/uL 138(L) 191 359    Dg Chest Port 1 View  03/02/2015   CLINICAL DATA:  Shortness of breath.  EXAM: PORTABLE CHEST - 1 VIEW  COMPARISON:  03/02/2015.  FINDINGS: Left in stable position. Stable severe cardiomegaly. Interval improvement of pulmonary venous congestion from interstitial edema. Persistent right base atelectasis and/or infiltrate with right pleural effusion. No pneumothorax.  IMPRESSION: 1. Left IJ line in stable position. 2. Stable severe cardiomegaly. Interval improvement of pulmonary venous congestion and pulmonary interstitial edema. 3. Persistent right base atelectasis and/or infiltrate with small right pleural effusion.   Electronically Signed   By: Maisie Fus  Register   On: 03/02/2015 07:25   Dg Chest Port 1 View  03/02/2015   CLINICAL DATA:   Central line placement.  EXAM: PORTABLE CHEST - 1 VIEW  COMPARISON:  03/01/2015  FINDINGS: Interval placement of left central venous catheter with tip overlying the junction of the SVC and brachiocephalic vein. No pneumothorax. Shallow inspiration. Cardiac enlargement. Infiltration or atelectasis in the right lung base.  IMPRESSION: Left central venous catheter tip overlies the junction of the SVC and brachiocephalic vein. No pneumothorax. Persistent infiltration or atelectasis in the right lung base.   Electronically Signed   By: Burman Nieves M.D.   On: 03/02/2015 02:46   Dg Chest Portable 1 View  03/01/2015   CLINICAL DATA:  Short of breath and history of CHF.  EXAM: PORTABLE CHEST - 1 VIEW  COMPARISON:  04/09/2012  FINDINGS: Mildly degraded exam due to AP portable technique and patient body habitus. Midline trachea. Cardiomegaly accentuated by AP portable technique. Small right pleural effusion, with suggestion of loculation laterally. No pneumothorax. low lung volumes. No overt congestive failure. Mild right hemidiaphragm elevation with right base airspace disease.  IMPRESSION: Inferior lateral right chest opacity is likely due to pleural fluid and adjacent infection or atelectasis. The pleural fluid may have minimal loculation laterally.  Cardiomegaly with low lung volumes.  No overt congestive failure.  Decreased sensitivity and specificity exam due to technique related factors, as described above.   Electronically Signed   By: Jeronimo Greaves M.D.   On: 03/01/2015 11:42    Review of Systems  Constitutional: Positive for malaise/fatigue. Negative for fever and chills.  HENT: Negative.   Eyes: Negative.   Respiratory: Positive for cough, sputum production and shortness of breath. Negative for hemoptysis and wheezing.   Cardiovascular: Positive for palpitations, orthopnea and leg swelling. Negative for chest pain.  Gastrointestinal: Positive for heartburn and nausea. Negative for vomiting and  abdominal pain.       Sensation of abdominal distention  Genitourinary: Negative.   Musculoskeletal: Positive for back pain. Negative for myalgias and neck pain.  Skin: Negative.   Neurological: Positive for weakness. Negative for dizziness, tingling and tremors.  Endo/Heme/Allergies: Negative.   Psychiatric/Behavioral: Negative for depression and suicidal ideas. The patient is not nervous/anxious.    Blood pressure 121/101, pulse 52, temperature 98 F (36.7 C), temperature source Oral, resp. rate 31, height  (1.651 m), weight 96.6 kg (212 lb 15.4 oz), SpO2 96 %. Physical Exam  Nursing note and vitals reviewed. Constitutional: He is oriented to person, place, and time. He appears well-developed and well-nourished.  Obese man who appears uncomfortable resting in bed  HENT:  Head: Normocephalic and atraumatic.  Nose: Nose normal.  Eyes: Conjunctivae and EOM are normal. Pupils are equal, round, and reactive to light. No scleral icterus.  Neck: Normal range of motion. Neck supple. JVD present.  8-10 cm JVD  Cardiovascular: Normal rate and regular rhythm.  Exam reveals no friction rub.   No murmur heard. Respiratory: Effort normal. He has no wheezes. He has rales.  GI: Soft. Bowel sounds  are normal. He exhibits distension. There is no tenderness. There is no rebound.  Musculoskeletal: Normal range of motion. He exhibits edema.  Neurological: He is alert and oriented to person, place, and time. No cranial nerve deficit.  Skin: Skin is warm and dry. No rash noted. No erythema.  Psychiatric: He has a normal mood and affect.    Assessment/Plan: 1. Acute renal failure on chronic kidney disease stage III: With decreasing urine output and worsening renal function after attempts at intravenous diuretic therapy for significant volume overload/CHF type symptoms. Urine output is in the marginal nonoliguric range. I will order a urinalysis, urine electrolytes and renal ultrasound. High  risk/possibility of requiring CRRT in the near future-no acute indications at this time: Would favor monitoring overnight to assess response to metolazone/higher dose furosemide and if still refractory, start CRRT for ultrafiltration/clearance (unless acutely needed overnight) 2. Hyperkalemia: Secondary to acute renal failure and marginal urine output-attempt medical management at this time 3. Cardiogenic shock: Ongoing support with low dose milrinone and Levophed-tachycardia noted 4. CHF exacerbation: Attempting diuretic therapy at this time with addition of metolazone to furosemide-if unresponsive, will need CRRT for ultrafiltration 5. Atrial flutter: management per cardiology, not on anticoagulation due to prior rectus sheath hematoma   Erasmo Vertz K. 03/02/2015, 2:25 PM

## 2015-03-02 NOTE — Progress Notes (Signed)
Patient called out stating he feels extremely hot. On assessment Patient noted decreased LOC, pale, diaphretic, cold and clammy. Patient also c/o chest pain. Blood pressure noted 50/20. Glucose noted 161. MD Joni Fears phoned and updated. MD ordered levaphed, 12ECG. When MD arrived patient's b/p was 92/48 and 12 lead was just ST. MD ordered to hold Levophed and transfer patient to the ICU. While awaiting for a bed to be assigned, patient b/p dropped to 40/20 and patient reported "feeling of doom" once again. MD Joni Fears on the unit and ordered to begin Levophed infusion. Patient is now in in 2H08. Report given to [facility].

## 2015-03-03 DIAGNOSIS — I4891 Unspecified atrial fibrillation: Secondary | ICD-10-CM

## 2015-03-03 LAB — GLUCOSE, CAPILLARY
GLUCOSE-CAPILLARY: 155 mg/dL — AB (ref 70–99)
Glucose-Capillary: 145 mg/dL — ABNORMAL HIGH (ref 70–99)
Glucose-Capillary: 195 mg/dL — ABNORMAL HIGH (ref 70–99)

## 2015-03-03 LAB — BASIC METABOLIC PANEL
Anion gap: 8 (ref 5–15)
BUN: 43 mg/dL — AB (ref 6–23)
CALCIUM: 8.6 mg/dL (ref 8.4–10.5)
CHLORIDE: 100 mmol/L (ref 96–112)
CO2: 28 mmol/L (ref 19–32)
Creatinine, Ser: 3.31 mg/dL — ABNORMAL HIGH (ref 0.50–1.35)
GFR calc Af Amer: 21 mL/min — ABNORMAL LOW (ref >90)
GFR calc non Af Amer: 18 mL/min — ABNORMAL LOW (ref >90)
GLUCOSE: 136 mg/dL — AB (ref 70–99)
POTASSIUM: 4.1 mmol/L (ref 3.5–5.1)
Sodium: 136 mmol/L (ref 135–145)

## 2015-03-03 LAB — CARBOXYHEMOGLOBIN
Carboxyhemoglobin: 0.7 % (ref 0.5–1.5)
Methemoglobin: 1 % (ref 0.0–1.5)
O2 Saturation: 57 %
Total hemoglobin: 13 g/dL — ABNORMAL LOW (ref 13.5–18.0)

## 2015-03-03 LAB — UREA NITROGEN, URINE: UREA NITROGEN UR: 204 mg/dL

## 2015-03-03 MED ORDER — AMIODARONE IV BOLUS ONLY 150 MG/100ML
150.0000 mg | Freq: Once | INTRAVENOUS | Status: AC
  Administered 2015-03-03: 150 mg via INTRAVENOUS

## 2015-03-03 MED ORDER — AMIODARONE LOAD VIA INFUSION
150.0000 mg | Freq: Once | INTRAVENOUS | Status: AC
  Administered 2015-03-03: 150 mg via INTRAVENOUS
  Filled 2015-03-03: qty 83.34

## 2015-03-03 NOTE — Progress Notes (Signed)
Patient ID: Martin [name], male   DOB: November 26, 1946, 69 y.o.   MRN: [id number]  [name] Progress Note    Assessment/ Plan:   1. Acute renal failure on chronic kidney disease stage III: He appears to have had an excellent urine output overnight with the addition of metolazone to furosemide. Currently, there are no indications for initiating CRRT-his hyperkalemia has also improved with brisk urinary output. He remains volume overloaded and I agree with the current diuretic dosing with gradual decrease starting tomorrow. 2. Hyperkalemia: Secondary to acute renal failure and improved with brisk urine output/diuretic therapy overnight 3. Cardiogenic shock: Ongoing support with low dose milrinone and Levophed-tachycardia noted 4. CHF exacerbation: Now appears to be responding to diuretic therapy-continue to follow input/output trend as well as daily weights. 5. Atrial flutter: management per cardiology, not on anticoagulation due to prior rectus sheath hematoma-consideration given to Eliquis  Subjective:   Reports to be feeling fair-denies any specific complaints    Objective:   BP 92/64 mmHg  Pulse 64  Temp(Src) 97.6 F (36.4 C) (Oral)  Resp 19  Ht 5\' 5"  (1.651 m)  Wt 96 kg (211 lb 10.3 oz)  BMI 35.22 kg/m2  SpO2 100%  Intake/Output Summary (Last 24 hours) at 03/03/15 2449 Last data filed at 03/03/15 0800  Gross per 24 hour  Intake 2387.11 ml  Output   3050 ml  Net -662.89 ml   Weight change: 0.9 kg (1 lb 15.8 oz)  Physical Exam: Gen: Comfortably resting in bed CVS: Pulse regular in rate and rhythm, S1 and S2 normal Resp: Anteriorly clear to auscultation-no distinct rales Abd: Soft, obese, nontender Ext: 3+ lower extremity edema to thighs  Imaging: US Renal  03/02/2015   CLINICAL DATA:  Acute renal failure and oliguria. History of hypertension and chronic kidney disease.  EXAM: RENAL/URINARY TRACT ULTRASOUND COMPLETE  COMPARISON:  04/09/2012  FINDINGS: Right  Kidney:  Length: 9.6 cm. Mildly increased cortical echogenicity. No evidence of hydronephrosis.  Left Kidney:  Length: 9.5 cm. Several simple appearing cysts identified with the largest measuring 2.5 cm in the lower pole. Mildly increased cortical echogenicity. No hydronephrosis.  Bladder:  Decompressed by Foley catheter.  IMPRESSION: Equal sized and mildly echogenic kidneys without evidence of hydronephrosis.   Electronically Signed   By: Irish Lack M.D.   On: 03/02/2015 20:53   US Abdomen Limited  03/02/2015   CLINICAL DATA:  Possible abdominal wall hernia. History of rectus sheath hematoma on the right.  EXAM: LIMITED ABDOMINAL ULTRASOUND  COMPARISON:  CT abdomen and pelvis 04/08/2012.  FINDINGS: Scanning was directed with the region of concern over the right mid abdomen. A solid and cystic lesion measuring 11.5 x 8.5 x 14.7 cm is identified. No flow within this lesion is identified. The appearance is not consistent with a hernia. The patient has a very small umbilical hernia. As seen on the comparison exam.  IMPRESSION: Solid and cystic of lesion in the region of concern is not a hernia. As the patient has a history of rectus sheath hematoma, this likely represents residual blood products. CT of the abdomen and pelvis with contrast could be used for further evaluation if indicated.  Likely small umbilical hernia.   Electronically Signed   By: Drusilla Kanner M.D.   On: 03/02/2015 15:36   Dg Chest Port 1 View  03/02/2015   CLINICAL DATA:  Shortness of breath.  EXAM: PORTABLE CHEST - 1 VIEW  COMPARISON:  03/02/2015.  FINDINGS: Left in stable position.  Stable severe cardiomegaly. Interval improvement of pulmonary venous congestion from interstitial edema. Persistent right base atelectasis and/or infiltrate with right pleural effusion. No pneumothorax.  IMPRESSION: 1. Left IJ line in stable position. 2. Stable severe cardiomegaly. Interval improvement of pulmonary venous congestion and pulmonary  interstitial edema. 3. Persistent right base atelectasis and/or infiltrate with small right pleural effusion.   Electronically Signed   By: Maisie Fus  Register   On: 03/02/2015 07:25   Dg Chest Port 1 View  03/02/2015   CLINICAL DATA:  Central line placement.  EXAM: PORTABLE CHEST - 1 VIEW  COMPARISON:  03/01/2015  FINDINGS: Interval placement of left central venous catheter with tip overlying the junction of the SVC and brachiocephalic vein. No pneumothorax. Shallow inspiration. Cardiac enlargement. Infiltration or atelectasis in the right lung base.  IMPRESSION: Left central venous catheter tip overlies the junction of the SVC and brachiocephalic vein. No pneumothorax. Persistent infiltration or atelectasis in the right lung base.   Electronically Signed   By: Burman Nieves M.D.   On: 03/02/2015 02:46   Dg Chest Portable 1 View  03/01/2015   CLINICAL DATA:  Short of breath and history of CHF.  EXAM: PORTABLE CHEST - 1 VIEW  COMPARISON:  04/09/2012  FINDINGS: Mildly degraded exam due to AP portable technique and patient body habitus. Midline trachea. Cardiomegaly accentuated by AP portable technique. Small right pleural effusion, with suggestion of loculation laterally. No pneumothorax. low lung volumes. No overt congestive failure. Mild right hemidiaphragm elevation with right base airspace disease.  IMPRESSION: Inferior lateral right chest opacity is likely due to pleural fluid and adjacent infection or atelectasis. The pleural fluid may have minimal loculation laterally.  Cardiomegaly with low lung volumes.  No overt congestive failure.  Decreased sensitivity and specificity exam due to technique related factors, as described above.   Electronically Signed   By: Jeronimo Greaves M.D.   On: 03/01/2015 11:42    Labs: BMET  Recent Labs Lab 03/01/15 1010 03/01/15 2310 03/02/15 0516 03/02/15 1350 03/03/15 0431  NA 141 137 137 134* 136  K 4.5 5.0 5.4* 5.0 4.1  CL 106 105 107 99 100  CO2 GLUCOSE 172* 131* 143* 173* 136*  BUN 25* 30* 34* 39* 43*  CREATININE 1.58* 2.11* 2.52* 3.03* 3.31*  CALCIUM 9.2 8.8 8.8 8.3* 8.6   CBC  Recent Labs Lab 03/01/15 1010 03/01/15 2310  WBC 6.1 7.2  NEUTROABS 4.0 5.3  HGB 15.8 15.5  HCT 49.0 47.4  MCV 92.6 90.5  PLT 191 138*    Medications:    . furosemide  120 mg Intravenous Q6H  . heparin  5,000 Units Subcutaneous 3 times per day  . insulin aspart  0-9 Units Subcutaneous TID WC  . metolazone  5 mg Oral BID  . sodium chloride  3 mL Intravenous Q12H   Zetta Bills, MD 03/03/2015, 8:24 AM

## 2015-03-03 NOTE — Progress Notes (Signed)
Patient noted to be in atrial fib with rate between 130 to 150's. Blood pressure stable. Dr. Shirlee Latch paged and notified of patient's current assessment, treatment given earlier in day, and current infusions. Orders received. See MAR.

## 2015-03-03 NOTE — Progress Notes (Signed)
ADVANCED HF TEAM PROGRESS NOTE  69 y/o male with h/o presumed NICM (I cannot find any cath notes), DM2, CKD, PAF. We are asked to see due to severe HF.  Admitted overnight with recurrent HF with marked volume overload in setting of recurrent AFL. Had prolonged hospitalization in 2013 due to cariogenic shock and AF and developed life-threatening rectus sheath hematoma so he has not been anticoagulated.  He remains on Levo + Milrinone + lasix 120 mg every 6 hours + metolazone. Also on Amio drip for A fib RVR. Weight down 1 pound. Urine output picked up.     Creatinine 3.03>3.31  CO-OX 57%   Past Medical History  Diagnosis Date  . Essential hypertension, benign   . Atrial fibrillation   . Cardiomyopathy     Transient cardiomyopathy with EF 30-35% 03/27/12, improved to 55-60% 04/16/12  . Acute renal failure     03/2012 (not on ACEI due to this) - renal US unremarkable  . Rectus sheath hematoma     Spontaneous during 03/2012 hospitalization managed conservatively with blood products  . Respiratory failure     VDRF 03/2012 in setting of CHF  . Acute blood loss anemia     Due to rectus sheath hematoma 03/2012  . Unspecified sleep apnea     Suspected during 03/2012 hospitalization   . Hematuria 03/2012  . Hyperglycemia 03/2012  . Shock     03/2012 hospitalization - Cardiogenic shock requiring pressors initially, followed by hemorrhagic shock later in hospitalization  . Pleural effusion 03/2012  . CHF (congestive heart failure)    . furosemide  120 mg Intravenous Q6H  . heparin  5,000 Units Subcutaneous 3 times per day  . insulin aspart  0-9 Units Subcutaneous TID WC  . metolazone  5 mg Oral BID  . sodium chloride  3 mL Intravenous Q12H   History   Social History  . Marital Status: Married    Spouse Name: N/A  . Number of Children: N/A  . Years of Education: N/A   Social History Main Topics  . Smoking status: Never Smoker   . Smokeless tobacco: Not on file  . Alcohol Use: Yes   Comment: former  . Drug Use: No  . Sexual Activity: Yes   Other Topics Concern  . None   Social History Narrative   Family Hx: Denies h/o familial CM or SCD   On exam - CVP 17  Ill appearing JVP up to ear. [facility] TLC Cor: Regular tachy +s3 Lungs: Crackles 1/3 up Ab: markedly distended. NT  Focal lesion in LUQ Ext: 3+ edema into thighs. Warm   Results for orders placed or performed during the hospital encounter of 03/01/15 (from the past 72 hour(s))  CBC with Differential     Status: None   Collection Time: 03/01/15 10:10 AM  Result Value Ref Range   WBC 6.1 4.0 - 10.5 K/uL   RBC 5.29 4.22 - 5.81 MIL/uL   Hemoglobin 15.8 13.0 - 17.0 g/dL   HCT 49.0 39.0 - 52.0 %   MCV 92.6 78.0 - 100.0 fL   MCH 29.9 26.0 - 34.0 pg   MCHC 32.2 30.0 - 36.0 g/dL   RDW 14.2 11.5 - 15.5 %   Platelets 191 150 - 400 K/uL   Neutrophils Relative % 66 43 - 77 %   Neutro Abs 4.0 1.7 - 7.7 K/uL   Lymphocytes Relative 25 12 - 46 %   Lymphs Abs 1.5 0.7 - 4.0 K/uL  Monocytes Relative 8 3 - 12 %   Monocytes Absolute 0.5 0.1 - 1.0 K/uL   Eosinophils Relative 1 0 - 5 %   Eosinophils Absolute 0.0 0.0 - 0.7 K/uL   Basophils Relative 0 0 - 1 %   Basophils Absolute 0.0 0.0 - 0.1 K/uL  Basic metabolic panel     Status: Abnormal   Collection Time: 03/01/15 10:10 AM  Result Value Ref Range   Sodium 141 135 - 145 mmol/L   Potassium 4.5 3.5 - 5.1 mmol/L   Chloride 106 96 - 112 mmol/L   CO2 24 19 - 32 mmol/L   Glucose, Bld 172 (H) 70 - 99 mg/dL   BUN 25 (H) 6 - 23 mg/dL   Creatinine, Ser 1.58 (H) 0.50 - 1.35 mg/dL   Calcium 9.2 8.4 - 10.5 mg/dL   GFR calc non Af Amer 44 (L) >90 mL/min   GFR calc Af Amer 51 (L) >90 mL/min    Comment: (NOTE) The eGFR has been calculated using the CKD EPI equation. This calculation has not been validated in all clinical situations. eGFR's persistently <90 mL/min signify possible Chronic Kidney Disease.    Anion gap 11 5 - 15  Brain natriuretic peptide     Status:  Abnormal   Collection Time: 03/01/15 10:10 AM  Result Value Ref Range   B Natriuretic Peptide 577.0 (H) 0.0 - 100.0 pg/mL  Troponin I     Status: Abnormal   Collection Time: 03/01/15 10:10 AM  Result Value Ref Range   Troponin I 0.13 (H) <0.031 ng/mL    Comment:        PERSISTENTLY INCREASED TROPONIN VALUES IN THE RANGE OF 0.04-0.49 ng/mL CAN BE SEEN IN:       -UNSTABLE ANGINA       -CONGESTIVE HEART FAILURE       -MYOCARDITIS       -CHEST TRAUMA       -ARRYHTHMIAS       -LATE PRESENTING MYOCARDIAL INFARCTION       -COPD   CLINICAL FOLLOW-UP RECOMMENDED.   I-Stat CG4 Lactic Acid, ED     Status: Abnormal   Collection Time: 03/01/15 10:59 AM  Result Value Ref Range   Lactic Acid, Venous 4.08 (HH) 0.5 - 2.0 mmol/L   Comment NOTIFIED PHYSICIAN   Blood culture (routine x 2)     Status: None (Preliminary result)   Collection Time: 03/01/15 11:23 AM  Result Value Ref Range   Specimen Description LEFT ANTECUBITAL    Special Requests BOTTLES DRAWN AEROBIC AND ANAEROBIC 6CC    Culture NO GROWTH 1 DAY    Report Status PENDING   Blood culture (routine x 2)     Status: None (Preliminary result)   Collection Time: 03/01/15 11:26 AM  Result Value Ref Range   Specimen Description RIGHT ANTECUBITAL    Special Requests BOTTLES DRAWN AEROBIC AND ANAEROBIC 6CC    Culture NO GROWTH 1 DAY    Report Status PENDING   I-Stat CG4 Lactic Acid, ED     Status: Abnormal   Collection Time: 03/01/15  2:35 PM  Result Value Ref Range   Lactic Acid, Venous 2.90 (HH) 0.5 - 2.0 mmol/L  MRSA PCR Screening     Status: None   Collection Time: 03/01/15  4:53 PM  Result Value Ref Range   MRSA by PCR NEGATIVE NEGATIVE    Comment:        The GeneXpert MRSA Assay (FDA approved for NASAL specimens  only), is one component of a comprehensive MRSA colonization surveillance program. It is not intended to diagnose MRSA infection nor to guide or monitor treatment for MRSA infections.   Lactic acid, plasma      Status: Abnormal   Collection Time: 03/01/15  6:24 PM  Result Value Ref Range   Lactic Acid, Venous 2.4 (HH) 0.5 - 2.0 mmol/L    Comment: REPEATED TO VERIFY CRITICAL RESULT CALLED TO, READ BACK BY AND VERIFIED WITH: ABDUSSALAAM K,RN 03/01/15 2226 WAYK   Lactic acid, plasma     Status: Abnormal   Collection Time: 03/01/15  8:42 PM  Result Value Ref Range   Lactic Acid, Venous 2.8 (HH) 0.5 - 2.0 mmol/L    Comment: REPEATED TO VERIFY CRITICAL RESULT CALLED TO, READ BACK BY AND VERIFIED WITH: ABDUSSALAAM K,RN 03/02/15 0052 WAYK   Blood gas, venous     Status: Abnormal   Collection Time: 03/01/15 11:10 PM  Result Value Ref Range   FIO2 0.21 %   pH, Ven 7.357 (H) 7.250 - 7.300   pCO2, Ven 39.6 (L) 45.0 - 50.0 mmHg   pO2, Ven 58.1 (H) 30.0 - 45.0 mmHg   Bicarbonate 21.7 20.0 - 24.0 mEq/L   TCO2 22.9 0 - 100 mmol/L   Acid-base deficit 3.0 (H) 0.0 - 2.0 mmol/L   O2 Saturation 85.9 %   Patient temperature 98.6    Collection site VEIN    Drawn by LAB    Sample type VENOUS   CBC WITH DIFFERENTIAL     Status: Abnormal   Collection Time: 03/01/15 11:10 PM  Result Value Ref Range   WBC 7.2 4.0 - 10.5 K/uL   RBC 5.24 4.22 - 5.81 MIL/uL   Hemoglobin 15.5 13.0 - 17.0 g/dL   HCT 47.4 39.0 - 52.0 %   MCV 90.5 78.0 - 100.0 fL   MCH 29.6 26.0 - 34.0 pg   MCHC 32.7 30.0 - 36.0 g/dL   RDW 14.3 11.5 - 15.5 %   Platelets 138 (L) 150 - 400 K/uL   Neutrophils Relative % 74 43 - 77 %   Neutro Abs 5.3 1.7 - 7.7 K/uL   Lymphocytes Relative 16 12 - 46 %   Lymphs Abs 1.2 0.7 - 4.0 K/uL   Monocytes Relative 9 3 - 12 %   Monocytes Absolute 0.7 0.1 - 1.0 K/uL   Eosinophils Relative 0 0 - 5 %   Eosinophils Absolute 0.0 0.0 - 0.7 K/uL   Basophils Relative 0 0 - 1 %   Basophils Absolute 0.0 0.0 - 0.1 K/uL  Comprehensive metabolic panel     Status: Abnormal   Collection Time: 03/01/15 11:10 PM  Result Value Ref Range   Sodium 137 135 - 145 mmol/L   Potassium 5.0 3.5 - 5.1 mmol/L   Chloride 105 96 -  112 mmol/L   CO2 25 19 - 32 mmol/L   Glucose, Bld 131 (H) 70 - 99 mg/dL   BUN 30 (H) 6 - 23 mg/dL   Creatinine, Ser 2.11 (H) 0.50 - 1.35 mg/dL   Calcium 8.8 8.4 - 10.5 mg/dL   Total Protein 6.5 6.0 - 8.3 g/dL   Albumin 3.2 (L) 3.5 - 5.2 g/dL   AST 38 (H) 0 - 37 U/L   ALT 35 0 - 53 U/L   Alkaline Phosphatase 66 39 - 117 U/L   Total Bilirubin 0.6 0.3 - 1.2 mg/dL   GFR calc non Af Amer 31 (L) >90 mL/min  GFR calc Af Amer 36 (L) >90 mL/min    Comment: (NOTE) The eGFR has been calculated using the CKD EPI equation. This calculation has not been validated in all clinical situations. eGFR's persistently <90 mL/min signify possible Chronic Kidney Disease.    Anion gap 7 5 - 15  Protime-INR     Status: None   Collection Time: 03/01/15 11:10 PM  Result Value Ref Range   Prothrombin Time 14.3 11.6 - 15.2 seconds   INR 1.10 0.00 - 1.49  Magnesium     Status: None   Collection Time: 03/01/15 11:10 PM  Result Value Ref Range   Magnesium 2.0 1.5 - 2.5 mg/dL  Brain natriuretic peptide     Status: Abnormal   Collection Time: 03/01/15 11:10 PM  Result Value Ref Range   B Natriuretic Peptide 693.5 (H) 0.0 - 100.0 pg/mL  TSH     Status: None   Collection Time: 03/01/15 11:10 PM  Result Value Ref Range   TSH 1.231 0.350 - 4.500 uIU/mL  Troponin I     Status: Abnormal   Collection Time: 03/01/15 11:10 PM  Result Value Ref Range   Troponin I 0.10 (H) <0.031 ng/mL    Comment:        PERSISTENTLY INCREASED TROPONIN VALUES IN THE RANGE OF 0.04-0.49 ng/mL CAN BE SEEN IN:       -UNSTABLE ANGINA       -CONGESTIVE HEART FAILURE       -MYOCARDITIS       -CHEST TRAUMA       -ARRYHTHMIAS       -LATE PRESENTING MYOCARDIAL INFARCTION       -COPD   CLINICAL FOLLOW-UP RECOMMENDED.   Glucose, capillary     Status: Abnormal   Collection Time: 03/01/15 11:52 PM  Result Value Ref Range   Glucose-Capillary 161 (H) 70 - 99 mg/dL  Lactic acid, plasma     Status: Abnormal   Collection Time: 03/02/15  12:35 AM  Result Value Ref Range   Lactic Acid, Venous 4.0 (HH) 0.5 - 2.0 mmol/L    Comment: REPEATED TO VERIFY CRITICAL RESULT CALLED TO, READ BACK BY AND VERIFIED WITH: PRIVETTE S,RN 03/02/15 0210 WAYK   Lactic acid, plasma     Status: Abnormal   Collection Time: 03/02/15  3:20 AM  Result Value Ref Range   Lactic Acid, Venous 2.3 (HH) 0.5 - 2.0 mmol/L    Comment: REPEATED TO VERIFY CRITICAL RESULT CALLED TO, READ BACK BY AND VERIFIED WITH: L SHORT,RN [id number] WILDERK   Troponin I     Status: Abnormal   Collection Time: 03/02/15  3:20 AM  Result Value Ref Range   Troponin I 0.10 (H) <0.031 ng/mL  Carboxyhemoglobin     Status: Abnormal   Collection Time: 03/02/15  4:32 AM  Result Value Ref Range   Total hemoglobin 15.9 13.5 - 18.0 g/dL   O2 Saturation 39.7 %   Carboxyhemoglobin 0.4 (L) 0.5 - 1.5 %   Methemoglobin 0.9 0.0 - 1.5 %  Basic metabolic panel     Status: Abnormal   Collection Time: 03/02/15  5:16 AM  Result Value Ref Range   Sodium 137 135 - 145 mmol/L   Potassium 5.4 (H) 3.5 - 5.1 mmol/L   Chloride 107 96 - 112 mmol/L   CO2 19 19 - 32 mmol/L   Glucose, Bld 143 (H) 70 - 99 mg/dL   BUN 34 (H) 6 - 23 mg/dL   Creatinine, Ser 2.52 (H)  0.50 - 1.35 mg/dL   Calcium 8.8 8.4 - 10.5 mg/dL   GFR calc non Af Amer 25 (L) >90 mL/min   GFR calc Af Amer 29 (L) >90 mL/min    Comment: (NOTE) The eGFR has been calculated using the CKD EPI equation. This calculation has not been validated in all clinical situations. eGFR's persistently <90 mL/min signify possible Chronic Kidney Disease.    Anion gap 11 5 - 15  Troponin I     Status: Abnormal   Collection Time: 03/02/15  5:16 AM  Result Value Ref Range   Troponin I 0.09 (H) <0.031 ng/mL    Comment:        PERSISTENTLY INCREASED TROPONIN VALUES IN THE RANGE OF 0.04-0.49 ng/mL CAN BE SEEN IN:       -UNSTABLE ANGINA       -CONGESTIVE HEART FAILURE       -MYOCARDITIS       -CHEST TRAUMA       -ARRYHTHMIAS       -LATE  PRESENTING MYOCARDIAL INFARCTION       -COPD   CLINICAL FOLLOW-UP RECOMMENDED.   Carboxyhemoglobin     Status: Abnormal   Collection Time: 03/02/15  9:18 AM  Result Value Ref Range   Total hemoglobin 15.3 13.5 - 18.0 g/dL   O2 Saturation 44.8 %   Carboxyhemoglobin 0.3 (L) 0.5 - 1.5 %   Methemoglobin 0.8 0.0 - 1.5 %  Troponin I     Status: Abnormal   Collection Time: 03/02/15 12:00 PM  Result Value Ref Range   Troponin I 0.10 (H) <0.031 ng/mL    Comment:        PERSISTENTLY INCREASED TROPONIN VALUES IN THE RANGE OF 0.04-0.49 ng/mL CAN BE SEEN IN:       -UNSTABLE ANGINA       -CONGESTIVE HEART FAILURE       -MYOCARDITIS       -CHEST TRAUMA       -ARRYHTHMIAS       -LATE PRESENTING MYOCARDIAL INFARCTION       -COPD   CLINICAL FOLLOW-UP RECOMMENDED.   Basic metabolic panel     Status: Abnormal   Collection Time: 03/02/15  1:50 PM  Result Value Ref Range   Sodium 134 (L) 135 - 145 mmol/L   Potassium 5.0 3.5 - 5.1 mmol/L   Chloride 99 96 - 112 mmol/L   CO2 25 19 - 32 mmol/L   Glucose, Bld 173 (H) 70 - 99 mg/dL   BUN 39 (H) 6 - 23 mg/dL   Creatinine, Ser 3.03 (H) 0.50 - 1.35 mg/dL   Calcium 8.3 (L) 8.4 - 10.5 mg/dL   GFR calc non Af Amer 20 (L) >90 mL/min   GFR calc Af Amer 23 (L) >90 mL/min    Comment: (NOTE) The eGFR has been calculated using the CKD EPI equation. This calculation has not been validated in all clinical situations. eGFR's persistently <90 mL/min signify possible Chronic Kidney Disease.    Anion gap 10 5 - 15  Carboxyhemoglobin     Status: Abnormal   Collection Time: 03/02/15  1:50 PM  Result Value Ref Range   Total hemoglobin 15.6 13.5 - 18.0 g/dL   O2 Saturation 39.5 %   Carboxyhemoglobin 0.2 (L) 0.5 - 1.5 %   Methemoglobin 0.9 0.0 - 1.5 %  Urinalysis, Routine w reflex microscopic     Status: Abnormal   Collection Time: 03/02/15  2:50 PM  Result Value Ref Range  Color, Urine YELLOW YELLOW   APPearance CLEAR CLEAR   Specific Gravity, Urine 1.010  1.005 - 1.030   pH 5.0 5.0 - 8.0   Glucose, UA NEGATIVE NEGATIVE mg/dL   Hgb urine dipstick LARGE (A) NEGATIVE   Bilirubin Urine NEGATIVE NEGATIVE   Ketones, ur NEGATIVE NEGATIVE mg/dL   Protein, ur 30 (A) NEGATIVE mg/dL   Urobilinogen, UA 1.0 0.0 - 1.0 mg/dL   Nitrite NEGATIVE NEGATIVE   Leukocytes, UA NEGATIVE NEGATIVE  Urine microscopic-add on     Status: Abnormal   Collection Time: 03/02/15  2:50 PM  Result Value Ref Range   Squamous Epithelial / LPF FEW (A) RARE   WBC, UA 0-2 <3 WBC/hpf   RBC / HPF 11-20 <3 RBC/hpf   Bacteria, UA FEW (A) RARE   Casts HYALINE CASTS (A) NEGATIVE  Sodium, urine, random     Status: None   Collection Time: 03/02/15  2:51 PM  Result Value Ref Range   Sodium, Ur 68 mmol/L  Creatinine, urine, random     Status: None   Collection Time: 03/02/15  2:51 PM  Result Value Ref Range   Creatinine, Urine 71.53 mg/dL  Urea nitrogen, urine     Status: None   Collection Time: 03/02/15  2:52 PM  Result Value Ref Range   Urea Nitrogen, Ur 204 mg/dL    Comment: (NOTE)   No established reference range for random urine. Performed at [facility]     Status: Abnormal   Collection Time: 03/02/15  9:25 PM  Result Value Ref Range   Total hemoglobin 15.1 13.5 - 18.0 g/dL   O2 Saturation 64.6 %   Carboxyhemoglobin 0.3 (L) 0.5 - 1.5 %   Methemoglobin 1.0 0.0 - 1.5 %  Carboxyhemoglobin     Status: Abnormal   Collection Time: 03/03/15  4:20 AM  Result Value Ref Range   Total hemoglobin 13.0 (L) 13.5 - 18.0 g/dL   O2 Saturation 57.0 %   Carboxyhemoglobin 0.7 0.5 - 1.5 %   Methemoglobin 1.0 0.0 - 1.5 %  Basic metabolic panel     Status: Abnormal   Collection Time: 03/03/15  4:31 AM  Result Value Ref Range   Sodium 136 135 - 145 mmol/L   Potassium 4.1 3.5 - 5.1 mmol/L    Comment: DELTA CHECK NOTED   Chloride 100 96 - 112 mmol/L   CO2 28 19 - 32 mmol/L   Glucose, Bld 136 (H) 70 - 99 mg/dL   BUN 43 (H) 6 - 23 mg/dL   Creatinine,  Ser 3.31 (H) 0.50 - 1.35 mg/dL   Calcium 8.6 8.4 - 10.5 mg/dL   GFR calc non Af Amer 18 (L) >90 mL/min   GFR calc Af Amer 21 (L) >90 mL/min    Comment: (NOTE) The eGFR has been calculated using the CKD EPI equation. This calculation has not been validated in all clinical situations. eGFR's persistently <90 mL/min signify possible Chronic Kidney Disease.    Anion gap 8 5 - 15    Assessment:  1. Cardiogenic shock 2. Acute on chronic systolic HF with probable severe biventricular failure (echo pending)    --suspect tachy induced 3. Acute on chronic renal failure stage 3    --likely due to ATN 4. Atrial flutter    --not on anticoag due to previous rectus sheath hematoma 2013 5. DM2 6. Hyperkalemia  Plan/discussion:   Suspect he has severe tachy-induced CM with cardiogenic shock physiology and marked volume.  Renal function continues to trend up. Will continue levophed support + milrinone 0.125 mcg.  CO-OX marginal 56% .   A Fib RVR over night on amiodarone drip at 60 mg per hour. Give another 150 mg amiodarone drip now. Not on anticoagulants due to rectal sheath hematoma.   Volume status a little better and urine output picked up. Continue lasix to 120 IV q6 + metolazone.  Will likely need CVVHD. Nephrology following  The patient is critically ill with multiple organ systems failure and requires high complexity decision making for assessment and support, frequent evaluation and titration of therapies, application of advanced monitoring technologies and extensive interpretation of multiple databases.   Critical Care Time devoted to patient care services described in this note is 35 Minutes.  Eytan Carrigan NP-C  7:11 AM

## 2015-03-03 NOTE — Progress Notes (Signed)
ADVANCED HF ROUNDING NOTE   Subjective:     69 y/o male with h/o presumed NICM (I cannot find any cath notes), DM2, CKD, PAF.   Admitted 3/6 with recurrent HF with marked volume overload in setting of recurrent AFL. Had prolonged hospitalization in 2013 due to cariogenic shock and AF and developed life-threatening rectus sheath hematoma so he has not been anticoagulated.  Ab u/s shows chronic hematoma in ab wall.   Initial co-ox39%. He remains on Levo + Milrinone + lasix 120 mg every 6 hours + metolazone. CO-OX 57%  Also on Amio drip for A fib RVR. Ratestil fast but says he feels better. Weight down 1 pound. Urine output picked up. -2.7L. CVP 17  Creatinine 3.03>3.31      Objective:   Weight Range:  Vital Signs:   Temp:  [97.5 F (36.4 C)-98.7 F (37.1 C)] 98.3 F (36.8 C) (03/08 0430) Pulse Rate:  [26-145] 55 (03/08 0700) Resp:  [13-35] 19 (03/08 0730) BP: (60-216)/(14-166) 83/59 mmHg (03/08 0730) SpO2:  [80 %-100 %] 100 % (03/08 0700) Weight:  [96 kg (211 lb 10.3 oz)] 96 kg (211 lb 10.3 oz) (03/08 0413) Last BM Date: 03/01/15 (per pt not a big result)  Weight change: Filed Weights   03/01/15 1646 03/02/15 0125 03/03/15 0413  Weight: 95.1 kg (209 lb 10.5 oz) 96.6 kg (212 lb 15.4 oz) 96 kg (211 lb 10.3 oz)    Intake/Output:   Intake/Output Summary (Last 24 hours) at 03/03/15 0758 Last data filed at 03/03/15 0700  Gross per 24 hour  Intake 2137.96 ml  Output   2725 ml  Net -587.04 ml     Physical Exam: General:  Lying in bed. No resp difficulty HEENT: normal Neck: supple. JVP jaw . Carotids 2+ bilat; no bruits. No lymphadenopathy or thryomegaly appreciated. Cor: PMI nondisplaced. Regular rate & rhythm. No rubs, gallops or murmurs. Lungs: clear Abdomen: soft, nontender, + distended. Focal hematoma/cyst in LUQ Extremities: no cyanosis, clubbing, rash, 3+ edema into thighs. Warm  Neuro: alert & orientedx3, cranial nerves grossly intact. moves all 4  extremities w/o difficulty. Affect pleasant  Telemetry: AF with RVR in 120-130  Labs: Basic Metabolic Panel:  Recent Labs Lab 03/01/15 1010 03/01/15 2310 03/02/15 0516 03/02/15 1350 03/03/15 0431  NA 141 137 137 134* 136  K 4.5 5.0 5.4* 5.0 4.1  CL 106 105 107 99 100  CO2 24 25 19 25 28   GLUCOSE 172* 131* 143* 173* 136*  BUN 25* 30* 34* 39* 43*  CREATININE 1.58* 2.11* 2.52* 3.03* 3.31*  CALCIUM 9.2 8.8 8.8 8.3* 8.6  MG  --  2.0  --   --   --     Liver Function Tests:  Recent Labs Lab 03/01/15 2310  AST 38*  ALT 35  ALKPHOS 66  BILITOT 0.6  PROT 6.5  ALBUMIN 3.2*   No results for input(s): LIPASE, AMYLASE in the last 168 hours. No results for input(s): AMMONIA in the last 168 hours.  CBC:  Recent Labs Lab 03/01/15 1010 03/01/15 2310  WBC 6.1 7.2  NEUTROABS 4.0 5.3  HGB 15.8 15.5  HCT 49.0 47.4  MCV 92.6 90.5  PLT 191 138*    Cardiac Enzymes:  Recent Labs Lab 03/01/15 1010 03/01/15 2310 03/02/15 0320 03/02/15 0516 03/02/15 1200  TROPONINI 0.13* 0.10* 0.10* 0.09* 0.10*    BNP: BNP (last 3 results)  Recent Labs  03/01/15 1010 03/01/15 2310  BNP 577.0* 693.5*    ProBNP (last  3 results) No results for input(s): PROBNP in the last 8760 hours.    Other results:  Imaging: US Renal  03/02/2015   CLINICAL DATA:  Acute renal failure and oliguria. History of hypertension and chronic kidney disease.  EXAM: RENAL/URINARY TRACT ULTRASOUND COMPLETE  COMPARISON:  04/09/2012  FINDINGS: Right Kidney:  Length: 9.6 cm. Mildly increased cortical echogenicity. No evidence of hydronephrosis.  Left Kidney:  Length: 9.5 cm. Several simple appearing cysts identified with the largest measuring 2.5 cm in the lower pole. Mildly increased cortical echogenicity. No hydronephrosis.  Bladder:  Decompressed by Foley catheter.  IMPRESSION: Equal sized and mildly echogenic kidneys without evidence of hydronephrosis.   Electronically Signed   By: Irish Lack M.D.    On: 03/02/2015 20:53   US Abdomen Limited  03/02/2015   CLINICAL DATA:  Possible abdominal wall hernia. History of rectus sheath hematoma on the right.  EXAM: LIMITED ABDOMINAL ULTRASOUND  COMPARISON:  CT abdomen and pelvis 04/08/2012.  FINDINGS: Scanning was directed with the region of concern over the right mid abdomen. A solid and cystic lesion measuring 11.5 x 8.5 x 14.7 cm is identified. No flow within this lesion is identified. The appearance is not consistent with a hernia. The patient has a very small umbilical hernia. As seen on the comparison exam.  IMPRESSION: Solid and cystic of lesion in the region of concern is not a hernia. As the patient has a history of rectus sheath hematoma, this likely represents residual blood products. CT of the abdomen and pelvis with contrast could be used for further evaluation if indicated.  Likely small umbilical hernia.   Electronically Signed   By: Drusilla Kanner M.D.   On: 03/02/2015 15:36   Dg Chest Port 1 View  03/02/2015   CLINICAL DATA:  Shortness of breath.  EXAM: PORTABLE CHEST - 1 VIEW  COMPARISON:  03/02/2015.  FINDINGS: Left in stable position. Stable severe cardiomegaly. Interval improvement of pulmonary venous congestion from interstitial edema. Persistent right base atelectasis and/or infiltrate with right pleural effusion. No pneumothorax.  IMPRESSION: 1. Left IJ line in stable position. 2. Stable severe cardiomegaly. Interval improvement of pulmonary venous congestion and pulmonary interstitial edema. 3. Persistent right base atelectasis and/or infiltrate with small right pleural effusion.   Electronically Signed   By: Maisie Fus  Register   On: 03/02/2015 07:25   Dg Chest Port 1 View  03/02/2015   CLINICAL DATA:  Central line placement.  EXAM: PORTABLE CHEST - 1 VIEW  COMPARISON:  03/01/2015  FINDINGS: Interval placement of left central venous catheter with tip overlying the junction of the SVC and brachiocephalic vein. No pneumothorax. Shallow  inspiration. Cardiac enlargement. Infiltration or atelectasis in the right lung base.  IMPRESSION: Left central venous catheter tip overlies the junction of the SVC and brachiocephalic vein. No pneumothorax. Persistent infiltration or atelectasis in the right lung base.   Electronically Signed   By: Burman Nieves M.D.   On: 03/02/2015 02:46   Dg Chest Portable 1 View  03/01/2015   CLINICAL DATA:  Short of breath and history of CHF.  EXAM: PORTABLE CHEST - 1 VIEW  COMPARISON:  04/09/2012  FINDINGS: Mildly degraded exam due to AP portable technique and patient body habitus. Midline trachea. Cardiomegaly accentuated by AP portable technique. Small right pleural effusion, with suggestion of loculation laterally. No pneumothorax. low lung volumes. No overt congestive failure. Mild right hemidiaphragm elevation with right base airspace disease.  IMPRESSION: Inferior lateral right chest opacity is likely due to  pleural fluid and adjacent infection or atelectasis. The pleural fluid may have minimal loculation laterally.  Cardiomegaly with low lung volumes.  No overt congestive failure.  Decreased sensitivity and specificity exam due to technique related factors, as described above.   Electronically Signed   By: Jeronimo Greaves M.D.   On: 03/01/2015 11:42      Medications:     Scheduled Medications: . furosemide  120 mg Intravenous Q6H  . heparin  5,000 Units Subcutaneous 3 times per day  . insulin aspart  0-9 Units Subcutaneous TID WC  . metolazone  5 mg Oral BID  . sodium chloride  3 mL Intravenous Q12H     Infusions: . amiodarone 60 mg/hr (03/03/15 0400)  . milrinone 0.125 mcg/kg/min (03/02/15 2000)  . norepinephrine (LEVOPHED) Adult infusion 2 mcg/min (03/03/15 0730)     PRN Medications:  sodium chloride, Place/Maintain arterial line **AND** sodium chloride, acetaminophen, ondansetron (ZOFRAN) IV, sodium chloride   Assessment:   1. Cardiogenic shock 2. Acute on chronic systolic HF with  probable severe biventricular failure (echo pending)  --suspect tachy induced 3. Acute on chronic renal failure stage 3  --likely due to ATN 4. Atrial flutter  --not on anticoag due to previous rectus sheath hematoma 2013 5. DM2 6. Hyperkalemia  Plan/Discussion:    Suspect he has severe tachy-induced CM with cardiogenic shock physiology and marked volume overload.   He is much improved with pressor support. Good urine output. CVP coming down. Hopefully creatinine will turn the corner soon. Continue lasix to 120 IV q6 + metolazone.No need for CVVHD at this point. Place TED hose.   Has persistent A Fib RVR over night on amiodarone drip at 60 mg per hour. Received another 150 amio this am for attempts at rate control. Give another 150 mg amiodarone drip now. Not on anticoagulants due to rectal sheath hematoma. Will likely need TEE & DC-CV however patient and his wife reluctant about anti-coagulation due to previous rectus sheath hematoma on coumadin will discuss Eliquis.   The patient is critically ill with multiple organ systems failure and requires high complexity decision making for assessment and support, frequent evaluation and titration of therapies, application of advanced monitoring technologies and extensive interpretation of multiple databases.   Critical Care Time devoted to patient care services described in this note is 35 Minutes.  Length of Stay: 2 [name] MD 03/03/2015, 7:58 AM  Advanced Heart Failure Team Pager [phone] (M-F; 7a - 4p)  Please contact [facility] for night-coverage after hours (4p -7a ) and weekends on [email or web address]

## 2015-03-04 DIAGNOSIS — I481 Persistent atrial fibrillation: Secondary | ICD-10-CM

## 2015-03-04 LAB — BASIC METABOLIC PANEL
Anion gap: 14 (ref 5–15)
BUN: 51 mg/dL — AB (ref 6–23)
CO2: 31 mmol/L (ref 19–32)
Calcium: 8.6 mg/dL (ref 8.4–10.5)
Chloride: 87 mmol/L — ABNORMAL LOW (ref 96–112)
Creatinine, Ser: 3.45 mg/dL — ABNORMAL HIGH (ref 0.50–1.35)
GFR calc non Af Amer: 17 mL/min — ABNORMAL LOW (ref >90)
GFR, EST AFRICAN AMERICAN: 20 mL/min — AB (ref >90)
Glucose, Bld: 129 mg/dL — ABNORMAL HIGH (ref 70–99)
Potassium: 3.4 mmol/L — ABNORMAL LOW (ref 3.5–5.1)
Sodium: 132 mmol/L — ABNORMAL LOW (ref 135–145)

## 2015-03-04 LAB — GLUCOSE, CAPILLARY
GLUCOSE-CAPILLARY: 112 mg/dL — AB (ref 70–99)
Glucose-Capillary: 153 mg/dL — ABNORMAL HIGH (ref 70–99)
Glucose-Capillary: 129 mg/dL — ABNORMAL HIGH (ref 70–99)
Glucose-Capillary: 144 mg/dL — ABNORMAL HIGH (ref 70–99)

## 2015-03-04 LAB — CARBOXYHEMOGLOBIN
CARBOXYHEMOGLOBIN: 0.9 % (ref 0.5–1.5)
Methemoglobin: 1.1 % (ref 0.0–1.5)
O2 Saturation: 78 %
Total hemoglobin: 14.5 g/dL (ref 13.5–18.0)

## 2015-03-04 LAB — HEMOGLOBIN A1C
Hgb A1c MFr Bld: 6.1 % — ABNORMAL HIGH (ref 4.8–5.6)
MEAN PLASMA GLUCOSE: 128 mg/dL

## 2015-03-04 MED ORDER — AMIODARONE IV BOLUS ONLY 150 MG/100ML
150.0000 mg | Freq: Once | INTRAVENOUS | Status: AC
  Administered 2015-03-04: 150 mg via INTRAVENOUS

## 2015-03-04 MED ORDER — SORBITOL 70 % SOLN
30.0000 mL | Freq: Once | Status: AC
Start: 2015-03-04 — End: 2015-03-04
  Administered 2015-03-04: 30 mL via ORAL
  Filled 2015-03-04: qty 30

## 2015-03-04 MED ORDER — POTASSIUM CHLORIDE CRYS ER 20 MEQ PO TBCR
20.0000 meq | EXTENDED_RELEASE_TABLET | Freq: Two times a day (BID) | ORAL | Status: AC
  Administered 2015-03-04 (×2): 20 meq via ORAL
  Filled 2015-03-04 (×2): qty 1

## 2015-03-04 MED ORDER — POTASSIUM CHLORIDE CRYS ER 20 MEQ PO TBCR
40.0000 meq | EXTENDED_RELEASE_TABLET | Freq: Once | ORAL | Status: AC
  Administered 2015-03-04: 20 meq via ORAL
  Filled 2015-03-04: qty 2

## 2015-03-04 MED ORDER — TORSEMIDE 20 MG PO TABS
40.0000 mg | ORAL_TABLET | Freq: Two times a day (BID) | ORAL | Status: DC
  Administered 2015-03-04: 40 mg via ORAL
  Filled 2015-03-04 (×4): qty 2

## 2015-03-04 MED ORDER — AMIODARONE HCL IN DEXTROSE 360-4.14 MG/200ML-% IV SOLN
30.0000 mg/h | INTRAVENOUS | Status: DC
  Administered 2015-03-04 (×2): 30 mg/h via INTRAVENOUS
  Filled 2015-03-04 (×6): qty 200

## 2015-03-04 MED ORDER — APIXABAN 5 MG PO TABS
5.0000 mg | ORAL_TABLET | Freq: Two times a day (BID) | ORAL | Status: DC
  Administered 2015-03-04 – 2015-03-07 (×7): 5 mg via ORAL
  Filled 2015-03-04 (×8): qty 1

## 2015-03-04 NOTE — Progress Notes (Signed)
Noted increase heart rate between 130-160 in afib. Blood pressure stable. Dr. Kym Groom paged and made aware of changes. Orders received. Amiodarone 150 mg IV bolus ordered and currently infusing.

## 2015-03-04 NOTE — Progress Notes (Signed)
Patient ID: Martin [name], male   DOB: 13-Apr-1946, 69 y.o.   MRN: [id number]  [facility] Progress Note    Assessment/ Plan:   1. Acute renal failure on chronic kidney disease stage III: He appears to have had an excellent urine output overnight with the addition of metolazone to furosemide. No indications for HD/CRRT-- would recommend decreasing lasix to 80mg  IV BID with decreased CVP and brisk UOP 2. Hypokalemia: Secondary to brisk urine output/diuretic therapy overnight- will replace via PO route 3. Cardiogenic shock: Ongoing support with low dose milrinone (weaned off pressors)-tachycardia resolved 4. CHF exacerbation: Responding to diuretic therapy-continue to follow input/output trend as well as daily weights. Would decrease lasix and continue BID metolazone  5. Atrial flutter: management per cardiology, not on anticoagulation due to prior rectus sheath hematoma-consideration given to Eliquis  Subjective:   No acute events overnight---reports to be feeling fair   Objective:   BP 100/62 mmHg  Pulse 67  Temp(Src) 98.4 F (36.9 C) (Oral)  Resp 21  Ht 5\' 5"  (1.651 m)  Wt 92.4 kg (203 lb 11.3 oz)  BMI 33.90 kg/m2  SpO2 100%  Intake/Output Summary (Last 24 hours) at 03/04/15 0818 Last data filed at 03/04/15 0800  Gross per 24 hour  Intake 2648.95 ml  Output   5600 ml  Net -2951.05 ml   Weight change: -3.6 kg (-7 lb 15 oz)  Physical Exam: MHD:[id number] resting in bed JHE:RDEYC RRR, normal s1 and s2 Resp:CTA bilaterally, no rales/rhonchi XKG:YJEH, obese, NT Ext:2+ LE edema to knees/lower thighs  Imaging: US Renal  03/02/2015   CLINICAL DATA:  Acute renal failure and oliguria. History of hypertension and chronic kidney disease.  EXAM: RENAL/URINARY TRACT ULTRASOUND COMPLETE  COMPARISON:  04/09/2012  FINDINGS: Right Kidney:  Length: 9.6 cm. Mildly increased cortical echogenicity. No evidence of hydronephrosis.  Left Kidney:  Length: 9.5 cm. Several simple  appearing cysts identified with the largest measuring 2.5 cm in the lower pole. Mildly increased cortical echogenicity. No hydronephrosis.  Bladder:  Decompressed by Foley catheter.  IMPRESSION: Equal sized and mildly echogenic kidneys without evidence of hydronephrosis.   Electronically Signed   By: Irish Lack M.D.   On: 03/02/2015 20:53   US Abdomen Limited  03/02/2015   CLINICAL DATA:  Possible abdominal wall hernia. History of rectus sheath hematoma on the right.  EXAM: LIMITED ABDOMINAL ULTRASOUND  COMPARISON:  CT abdomen and pelvis 04/08/2012.  FINDINGS: Scanning was directed with the region of concern over the right mid abdomen. A solid and cystic lesion measuring 11.5 x 8.5 x 14.7 cm is identified. No flow within this lesion is identified. The appearance is not consistent with a hernia. The patient has a very small umbilical hernia. As seen on the comparison exam.  IMPRESSION: Solid and cystic of lesion in the region of concern is not a hernia. As the patient has a history of rectus sheath hematoma, this likely represents residual blood products. CT of the abdomen and pelvis with contrast could be used for further evaluation if indicated.  Likely small umbilical hernia.   Electronically Signed   By: Drusilla Kanner M.D.   On: 03/02/2015 15:36    Labs: BMET  Recent Labs Lab 03/01/15 1010 03/01/15 2310 03/02/15 0516 03/02/15 1350 03/03/15 0431 03/04/15 0403  NA 141 137 137 134* 136 132*  K 4.5 5.0 5.4* 5.0 4.1 3.4*  CL 106 105 107 99 100 87*  CO2 24 25 19 25 28 31   GLUCOSE 172* 131*  143* 173* 136* 129*  BUN 25* 30* 34* 39* 43* 51*  CREATININE 1.58* 2.11* 2.52* 3.03* 3.31* 3.45*  CALCIUM 9.2 8.8 8.8 8.3* 8.6 8.6   CBC  Recent Labs Lab 03/01/15 1010 03/01/15 2310  WBC 6.1 7.2  NEUTROABS 4.0 5.3  HGB 15.8 15.5  HCT 49.0 47.4  MCV 92.6 90.5  PLT 191 138*   Medications:    . furosemide  120 mg Intravenous Q6H  . heparin  5,000 Units Subcutaneous 3 times per day  .  insulin aspart  0-9 Units Subcutaneous TID WC  . metolazone  5 mg Oral BID  . potassium chloride  20 mEq Oral BID  . sodium chloride  3 mL Intravenous Q12H   Zetta Bills, MD 03/04/2015, 8:18 AM

## 2015-03-04 NOTE — Progress Notes (Addendum)
ADVANCED HF ROUNDING NOTE   Subjective:     69 y/o male with h/o presumed NICM (I cannot find any cath notes), DM2, CKD, PAF.   Admitted 3/6 with recurrent HF with marked volume overload in setting of recurrent AFL. Had prolonged hospitalization in 2013 due to cariogenic shock and AF and developed life-threatening rectus sheath hematoma so he has not been anticoagulated.  Ab u/s shows chronic hematoma in ab wall.   Initial co-ox39%. Off levophed. He remains on Milrinone + IV lasix 120 mg + metolazone. Also on Amio drip for A fib RVR. Ratestil fast but says he feels better.   Urine output continues to be brisk. Weight down 9 pound in 2 days. CVP 6. Co-ox 78%  Creatinine 3.03>3.31 > 3.4     Objective:   Weight Range:  Vital Signs:   Temp:  [97.4 F (36.3 C)-98.5 F (36.9 C)] 98.4 F (36.9 C) (03/09 0725) Pulse Rate:  [25-162] 73 (03/09 0830) Resp:  [16-29] 20 (03/09 0830) BP: (89-131)/(53-112) 90/63 mmHg (03/09 0830) SpO2:  [79 %-100 %] 99 % (03/09 0830) Weight:  [92.4 kg (203 lb 11.3 oz)] 92.4 kg (203 lb 11.3 oz) (03/09 0441) Last BM Date: 03/01/15  Weight change: Filed Weights   03/02/15 0125 03/03/15 0413 03/04/15 0441  Weight: 96.6 kg (212 lb 15.4 oz) 96 kg (211 lb 10.3 oz) 92.4 kg (203 lb 11.3 oz)    Intake/Output:   Intake/Output Summary (Last 24 hours) at 03/04/15 1006 Last data filed at 03/04/15 0900  Gross per 24 hour  Intake 1843.35 ml  Output   5925 ml  Net -4081.65 ml     Physical Exam: General:  Sitting in chair . No resp difficulty HEENT: normal Neck: supple. JVP 6-8 . Carotids 2+ bilat; no bruits. No lymphadenopathy or thryomegaly appreciated. Cor: PMI nondisplaced. Irregular tachy. No rubs, gallops or murmurs. Lungs: clear Abdomen: soft, nontender, nondistended. Focal hematoma/cyst in LUQ Extremities: no cyanosis, clubbing, rash, tr edema. Warm  Neuro: alert & orientedx3, cranial nerves grossly intact. moves all 4 extremities w/o  difficulty. Affect pleasant  Telemetry: AF with RVR in 120-130  Labs: Basic Metabolic Panel:  Recent Labs Lab 03/01/15 2310 03/02/15 0516 03/02/15 1350 03/03/15 0431 03/04/15 0403  NA 137 137 134* 136 132*  K 5.0 5.4* 5.0 4.1 3.4*  CL 105 107 99 100 87*  CO2 25 19 25 28 31   GLUCOSE 131* 143* 173* 136* 129*  BUN 30* 34* 39* 43* 51*  CREATININE 2.11* 2.52* 3.03* 3.31* 3.45*  CALCIUM 8.8 8.8 8.3* 8.6 8.6  MG 2.0  --   --   --   --     Liver Function Tests:  Recent Labs Lab 03/01/15 2310  AST 38*  ALT 35  ALKPHOS 66  BILITOT 0.6  PROT 6.5  ALBUMIN 3.2*   No results for input(s): LIPASE, AMYLASE in the last 168 hours. No results for input(s): AMMONIA in the last 168 hours.  CBC:  Recent Labs Lab 03/01/15 1010 03/01/15 2310  WBC 6.1 7.2  NEUTROABS 4.0 5.3  HGB 15.8 15.5  HCT 49.0 47.4  MCV 92.6 90.5  PLT 191 138*    Cardiac Enzymes:  Recent Labs Lab 03/01/15 1010 03/01/15 2310 03/02/15 0320 03/02/15 0516 03/02/15 1200  TROPONINI 0.13* 0.10* 0.10* 0.09* 0.10*    BNP: BNP (last 3 results)  Recent Labs  03/01/15 1010 03/01/15 2310  BNP 577.0* 693.5*    ProBNP (last 3 results) No results for input(s):  PROBNP in the last 8760 hours.    Other results:  Imaging: US Renal  03/02/2015   CLINICAL DATA:  Acute renal failure and oliguria. History of hypertension and chronic kidney disease.  EXAM: RENAL/URINARY TRACT ULTRASOUND COMPLETE  COMPARISON:  04/09/2012  FINDINGS: Right Kidney:  Length: 9.6 cm. Mildly increased cortical echogenicity. No evidence of hydronephrosis.  Left Kidney:  Length: 9.5 cm. Several simple appearing cysts identified with the largest measuring 2.5 cm in the lower pole. Mildly increased cortical echogenicity. No hydronephrosis.  Bladder:  Decompressed by Foley catheter.  IMPRESSION: Equal sized and mildly echogenic kidneys without evidence of hydronephrosis.   Electronically Signed   By: Irish Lack M.D.   On: 03/02/2015  20:53   US Abdomen Limited  03/02/2015   CLINICAL DATA:  Possible abdominal wall hernia. History of rectus sheath hematoma on the right.  EXAM: LIMITED ABDOMINAL ULTRASOUND  COMPARISON:  CT abdomen and pelvis 04/08/2012.  FINDINGS: Scanning was directed with the region of concern over the right mid abdomen. A solid and cystic lesion measuring 11.5 x 8.5 x 14.7 cm is identified. No flow within this lesion is identified. The appearance is not consistent with a hernia. The patient has a very small umbilical hernia. As seen on the comparison exam.  IMPRESSION: Solid and cystic of lesion in the region of concern is not a hernia. As the patient has a history of rectus sheath hematoma, this likely represents residual blood products. CT of the abdomen and pelvis with contrast could be used for further evaluation if indicated.  Likely small umbilical hernia.   Electronically Signed   By: Drusilla Kanner M.D.   On: 03/02/2015 15:36     Medications:     Scheduled Medications: . furosemide  120 mg Intravenous Q6H  . heparin  5,000 Units Subcutaneous 3 times per day  . insulin aspart  0-9 Units Subcutaneous TID WC  . metolazone  5 mg Oral BID  . potassium chloride  20 mEq Oral BID  . sodium chloride  3 mL Intravenous Q12H    Infusions: . amiodarone 60 mg/hr (03/04/15 0300)  . milrinone 0.125 mcg/kg/min (03/03/15 2000)  . norepinephrine (LEVOPHED) Adult infusion Stopped (03/03/15 1630)    PRN Medications: sodium chloride, Place/Maintain arterial line **AND** sodium chloride, acetaminophen, ondansetron (ZOFRAN) IV, sodium chloride   Assessment:   1. Cardiogenic shock 2. Acute on chronic systolic HF with probable severe biventricular failure (echo pending)  --suspect tachy induced 3. Acute on chronic renal failure stage 3  --likely due to ATN 4. Atrial flutter  --not on anticoag due to previous rectus sheath hematoma 2013 5. DM2 6. Hyperkalemia  Plan/Discussion:    Suspect he has  severe tachy-induced CM with cardiogenic shock physiology and marked volume overload.   He is much improved with milrinone and diuresis though creatinine still climbing a bit. CVP now in normal range. Will change to torsemide 40 bid.Hopefully creatinine will turn the corner soon.  Has persistent A Fib RVR despite amio gtt and multiple boluses. I think he needs a shot at [facility]. However not on anticoagulants due to rectal sheath hematoma in 2013. I discussed Eliquis with him and he is willing to try it. I will discuss with his wife as well. Possible TEE and DC-CV on Friday. Will consult CR.  Can go to SDU   Length of Stay: 3 [name] MD 03/04/2015, 10:06 AM  Advanced Heart Failure Team Pager [phone] (M-F; 7a - 4p)  Please contact [facility]  for night-coverage after hours (4p -7a ) and weekends on [email or web address]

## 2015-03-05 ENCOUNTER — Inpatient Hospital Stay (HOSPITAL_COMMUNITY): Payer: Medicare Other | Admitting: Certified Registered"

## 2015-03-05 ENCOUNTER — Encounter (HOSPITAL_COMMUNITY)
Admission: EM | Disposition: A | Discharge status: Home / Self Care | Payer: Self-pay | Source: Home / Self Care | Attending: Cardiovascular Disease

## 2015-03-05 ENCOUNTER — Encounter (HOSPITAL_COMMUNITY): Payer: Self-pay | Admitting: *Deleted

## 2015-03-05 DIAGNOSIS — I340 Nonrheumatic mitral (valve) insufficiency: Secondary | ICD-10-CM

## 2015-03-05 HISTORY — PX: CARDIOVERSION: SHX1299

## 2015-03-05 HISTORY — PX: TEE WITHOUT CARDIOVERSION: SHX5443

## 2015-03-05 LAB — BASIC METABOLIC PANEL
ANION GAP: 10 (ref 5–15)
Anion gap: 14 (ref 5–15)
BUN: 47 mg/dL — ABNORMAL HIGH (ref 6–23)
BUN: 48 mg/dL — ABNORMAL HIGH (ref 6–23)
CALCIUM: 8.2 mg/dL — AB (ref 8.4–10.5)
CHLORIDE: 72 mmol/L — AB (ref 96–112)
CO2: 43 mmol/L (ref 19–32)
CO2: 46 mmol/L (ref 19–32)
Calcium: 9 mg/dL (ref 8.4–10.5)
Chloride: 78 mmol/L — ABNORMAL LOW (ref 96–112)
Creatinine, Ser: 2.8 mg/dL — ABNORMAL HIGH (ref 0.50–1.35)
Creatinine, Ser: 2.92 mg/dL — ABNORMAL HIGH (ref 0.50–1.35)
GFR calc Af Amer: 26 mL/min — ABNORMAL LOW (ref >90)
GFR calc non Af Amer: 21 mL/min — ABNORMAL LOW (ref >90)
GFR, EST AFRICAN AMERICAN: 24 mL/min — AB (ref >90)
GFR, EST NON AFRICAN AMERICAN: 22 mL/min — AB (ref >90)
GLUCOSE: 406 mg/dL — AB (ref 70–99)
GLUCOSE: 131 mg/dL — AB (ref 70–99)
POTASSIUM: 3.1 mmol/L — AB (ref 3.5–5.1)
Potassium: 3 mmol/L — ABNORMAL LOW (ref 3.5–5.1)
SODIUM: 134 mmol/L — AB (ref 135–145)
Sodium: 129 mmol/L — ABNORMAL LOW (ref 135–145)

## 2015-03-05 LAB — CBC
HEMATOCRIT: 42.3 % (ref 39.0–52.0)
Hemoglobin: 14.3 g/dL (ref 13.0–17.0)
MCH: 29.4 pg (ref 26.0–34.0)
MCHC: 33.8 g/dL (ref 30.0–36.0)
MCV: 86.9 fL (ref 78.0–100.0)
Platelets: 137 10*3/uL — ABNORMAL LOW (ref 150–400)
RBC: 4.87 MIL/uL (ref 4.22–5.81)
RDW: 13.5 % (ref 11.5–15.5)
WBC: 7.1 10*3/uL (ref 4.0–10.5)

## 2015-03-05 LAB — GLUCOSE, CAPILLARY
GLUCOSE-CAPILLARY: 142 mg/dL — AB (ref 70–99)
GLUCOSE-CAPILLARY: 144 mg/dL — AB (ref 70–99)
GLUCOSE-CAPILLARY: 131 mg/dL — AB (ref 70–99)
Glucose-Capillary: 139 mg/dL — ABNORMAL HIGH (ref 70–99)
Glucose-Capillary: 127 mg/dL — ABNORMAL HIGH (ref 70–99)
Glucose-Capillary: 179 mg/dL — ABNORMAL HIGH (ref 70–99)

## 2015-03-05 LAB — CARBOXYHEMOGLOBIN
Carboxyhemoglobin: 0.9 % (ref 0.5–1.5)
METHEMOGLOBIN: 0.8 % (ref 0.0–1.5)
O2 SAT: 63.2 %
Total hemoglobin: 15.3 g/dL (ref 13.5–18.0)

## 2015-03-05 SURGERY — ECHOCARDIOGRAM, TRANSESOPHAGEAL
Anesthesia: General

## 2015-03-05 MED ORDER — PHENYLEPHRINE HCL 10 MG/ML IJ SOLN
10.0000 mg | INTRAMUSCULAR | Status: DC | PRN
  Administered 2015-03-05: 25 ug/min via INTRAVENOUS

## 2015-03-05 MED ORDER — MIDAZOLAM HCL 5 MG/ML IJ SOLN
INTRAMUSCULAR | Status: AC
  Filled 2015-03-05: qty 2

## 2015-03-05 MED ORDER — FENTANYL CITRATE 0.05 MG/ML IJ SOLN
INTRAMUSCULAR | Status: DC | PRN
  Administered 2015-03-05 (×3): 25 ug via INTRAVENOUS

## 2015-03-05 MED ORDER — MIDAZOLAM HCL 5 MG/5ML IJ SOLN
INTRAMUSCULAR | Status: DC | PRN
  Administered 2015-03-05: 2 mg via INTRAVENOUS
  Administered 2015-03-05: 1 mg via INTRAVENOUS

## 2015-03-05 MED ORDER — LIDOCAINE VISCOUS 2 % MT SOLN
OROMUCOSAL | Status: DC | PRN
  Administered 2015-03-05: 1 via OROMUCOSAL

## 2015-03-05 MED ORDER — SODIUM CHLORIDE 0.9 % IV SOLN: INTRAVENOUS | Status: DC

## 2015-03-05 MED ORDER — RANOLAZINE ER 500 MG PO TB12
500.0000 mg | ORAL_TABLET | Freq: Two times a day (BID) | ORAL | Status: DC
  Administered 2015-03-05 – 2015-03-07 (×4): 500 mg via ORAL
  Filled 2015-03-05 (×5): qty 1

## 2015-03-05 MED ORDER — ETOMIDATE 2 MG/ML IV SOLN
INTRAVENOUS | Status: DC | PRN
  Administered 2015-03-05: 6 mg via INTRAVENOUS

## 2015-03-05 MED ORDER — MILRINONE IN DEXTROSE 20 MG/100ML IV SOLN
0.1250 ug/kg/min | INTRAVENOUS | Status: DC
  Administered 2015-03-05: 0.125 ug/kg/min via INTRAVENOUS

## 2015-03-05 MED ORDER — FENTANYL CITRATE 0.05 MG/ML IJ SOLN
INTRAMUSCULAR | Status: AC
  Filled 2015-03-05: qty 2

## 2015-03-05 MED ORDER — POTASSIUM CHLORIDE CRYS ER 20 MEQ PO TBCR
20.0000 meq | EXTENDED_RELEASE_TABLET | ORAL | Status: AC
  Administered 2015-03-05 (×3): 20 meq via ORAL
  Filled 2015-03-05 (×3): qty 1

## 2015-03-05 NOTE — Interval H&P Note (Signed)
History and Physical Interval Note:  03/05/2015 1:55 PM  Martin [name]  has presented today for surgery, with the diagnosis of a fib  The various methods of treatment have been discussed with the patient and family. After consideration of risks, benefits and other options for treatment, the patient has consented to  Procedure(s): TRANSESOPHAGEAL ECHOCARDIOGRAM (TEE) (N/A) CARDIOVERSION (N/A) as a surgical intervention .  The patient's history has been reviewed, patient examined, no change in status, stable for surgery.  I have reviewed the patient's chart and labs.  Questions were answered to the patient's satisfaction.     Betsy Rosello

## 2015-03-05 NOTE — Progress Notes (Signed)
Patient ID: Martin [name], male   DOB: 05-30-1946, 69 y.o.   MRN: [id number]  [facility] Progress Note    Assessment/ Plan:   1. Acute renal failure on chronic kidney disease stage III: He appears to have had an excellent urine output overnight with metolazone to furosemide. Earlier today, diuretic stopped by cardiology due to low CVP/tachycardia/signs of intravascular volume contraction. No dialysis needs at this time. 2. Hypokalemia: Secondary to brisk urine output/diuretic therapy overnight- replacement ordered by cardiology service 3. Cardiogenic shock: Ongoing support with low dose milrinone (weaned off pressors)-tachycardia resolved 4. CHF exacerbation: Excellent response to diuretics-currently on hold due to low CVP 5. Atrial flutter: management per cardiology, plans in place for TEE/cardioversion  Subjective:   Reports to be feeling well, anxious about upcoming heart procedure    Objective:   BP 94/50 mmHg  Pulse 143  Temp(Src) 98.5 F (36.9 C) (Oral)  Resp 27  Ht 5\' 5"  (1.651 m)  Wt 90.4 kg (199 lb 4.7 oz)  BMI 33.16 kg/m2  SpO2 93%  Intake/Output Summary (Last 24 hours) at 03/05/15 0841 Last data filed at 03/05/15 0800  Gross per 24 hour  Intake 1309.3 ml  Output   5050 ml  Net -3740.7 ml   Weight change: -2 kg (-4 lb 6.6 oz)  Physical Exam: Gen: Comfortably resting in bed CVS: Pulse regular tachycardia Resp: Decreased breath sounds over bases otherwise clear Abd: Soft, obese, nontender Ext: 2+ lower extremity edema  Imaging: No results found.  Labs: BMET  Recent Labs Lab 03/01/15 2310 03/02/15 0516 03/02/15 1350 03/03/15 0431 03/04/15 0403 03/05/15 0500 03/05/15 0625  NA 137 137 134* 136 132* 129* 134*  K 5.0 5.4* 5.0 4.1 3.4* 3.0* 3.1*  CL 105 107 99 100 87* 72* 78*  CO2 25 19 25 28 31  43* 46*  GLUCOSE 131* 143* 173* 136* 129* 406* 131*  BUN 30* 34* 39* 43* 51* 47* 48*  CREATININE 2.11* 2.52* 3.03* 3.31* 3.45* 2.80* 2.92*   CALCIUM 8.8 8.8 8.3* 8.6 8.6 8.2* 9.0   CBC  Recent Labs Lab 03/01/15 1010 03/01/15 2310 03/05/15 0500  WBC 6.1 7.2 7.1  NEUTROABS 4.0 5.3  --   HGB 15.8 15.5 14.3  HCT 49.0 47.4 42.3  MCV 92.6 90.5 86.9  PLT 191 138* 137*    Medications:    . apixaban  5 mg Oral BID  . insulin aspart  0-9 Units Subcutaneous TID WC  . sodium chloride  3 mL Intravenous Q12H   Zetta Bills, MD 03/05/2015, 8:41 AM

## 2015-03-05 NOTE — Transfer of Care (Signed)
Immediate Anesthesia Transfer of Care Note  Patient: Martin [name]  Procedure(s) Performed: Procedure(s): TRANSESOPHAGEAL ECHOCARDIOGRAM (TEE) (N/A) CARDIOVERSION (N/A)  Patient Location: PACU and Endoscopy Unit  Anesthesia Type:MAC  Level of Consciousness: sedated  Airway & Oxygen Therapy: Patient connected to nasal cannula oxygen  Post-op Assessment: Post -op Vital signs reviewed and stable  Post vital signs: stable  Last Vitals:  Filed Vitals:   03/05/15 1415  BP: 118/66  Pulse:   Temp:   Resp:     Complications: No apparent anesthesia complications

## 2015-03-05 NOTE — Progress Notes (Signed)
Patient arrived back to room from endoscopy. Stable, no complaints. Will continue to monitor closely.

## 2015-03-05 NOTE — Anesthesia Postprocedure Evaluation (Signed)
  Anesthesia Post-op Note  Patient: Martin [name]  Procedure(s) Performed: Procedure(s): TRANSESOPHAGEAL ECHOCARDIOGRAM (TEE) (N/A) CARDIOVERSION (N/A)  Patient Location: Endoscopy Unit  Anesthesia Type:MAC  Level of Consciousness: awake, alert , oriented and patient cooperative  Airway and Oxygen Therapy: Patient Spontanous Breathing and Patient connected to nasal cannula oxygen  Post-op Pain: none  Post-op Assessment: Post-op Vital signs reviewed, Patient's Cardiovascular Status Stable, Respiratory Function Stable, Patent Airway, No signs of Nausea or vomiting and Pain level controlled  Post-op Vital Signs: Reviewed and stable  Last Vitals:  Filed Vitals:   03/05/15 1445  BP: 98/54  Pulse: 70  Temp:   Resp: 19    Complications: No apparent anesthesia complications

## 2015-03-05 NOTE — CV Procedure (Signed)
    TRANSESOPHAGEAL ECHOCARDIOGRAM GUIDED DIRECT CURRENT CARDIOVERSION  NAME:  Martin [name]   MRN: [id number] DOB:  1946-01-18   ADMIT DATE: 03/01/2015  INDICATIONS: Rapid AF   PROCEDURE:   Informed consent was obtained prior to the procedure. The risks, benefits and alternatives for the procedure were discussed and the patient comprehended these risks.  Risks include, but are not limited to, cough, sore throat, vomiting, nausea, somnolence, esophageal and stomach trauma or perforation, bleeding, low blood pressure, aspiration, pneumonia, infection, trauma to the teeth and death.    After a procedural time-out, the patient was given 3 mg versed and 75 mcg fentanyl for moderate sedation.  The oropharynx was anesthetized 10 cc of topical 1% viscous lidocaine.  The transesophageal probe was inserted in the esophagus and stomach without difficulty and multiple views were obtained.    FINDINGS:  LEFT VENTRICLE: Dilated. EF = 10-15% Global HK  RIGHT VENTRICLE: Dilated. Severe global HK  LEFT ATRIUM: Severely dialted  LEFT ATRIAL APPENDAGE: No clot  RIGHT ATRIUM: Dilated  AORTIC VALVE:  Trileaflet. No AI/AS  MITRAL VALVE:    Normal. Mild posterior MR  TRICUSPID VALVE: Normal Trivial TR  PULMONIC VALVE: Normal No significant PI  INTERATRIAL SEPTUM: No PFO/ASD  PERICARDIUM: No effusion   DESCENDING AORTA: mild plaque   CARDIOVERSION:      Procedure Details:  Once the TEE was complete, the patient had the defibrillator pads placed in the anterior and posterior position. The patient then underwent further sedation by the anesthesia service for cardioversion . Once an appropriate level of sedation was achieved, the patient received a single biphasic, synchronized 200J shock with prompt conversion to sinus rhythm with frequent PACs. No apparent complications.  COMPLICATIONS:    There were no immediate complications.   CONCLUSION:   1.  Successful TEE guided cardioversion.     Acacia Latorre,MD 2:17 PM

## 2015-03-05 NOTE — H&P (View-Only) (Signed)
ADVANCED HF ROUNDING NOTE   Subjective:     69 y/o male with h/o presumed NICM (I cannot find any cath notes), DM2, CKD, PAF.   Admitted 3/6 with recurrent HF with marked volume overload in setting of recurrent AFL. Had prolonged hospitalization in 2013 due to cariogenic shock and AF and developed life-threatening rectus sheath hematoma so he has not been anticoagulated.  Ab u/s shows chronic hematoma in ab wall.   Initial co-ox39%. Off levophed. Yesterday he was transitioned torsemide. Milrinone continued at 0.125 mcg. He remains on amio at 30 mg per hour.  A fib RVR.    Weight down 4 pounds over night.  CVP 2 Co-ox 63%   Creatinine 2.9      Objective:   Weight Range:  Vital Signs:   Temp:  [98 F (36.7 C)-98.5 F (36.9 C)] 98.5 F (36.9 C) (03/10 0744) Pulse Rate:  [26-157] 143 (03/10 0744) Resp:  [13-27] 27 (03/10 0744) BP: (86-144)/(47-88) 94/50 mmHg (03/10 0744) SpO2:  [64 %-99 %] 93 % (03/10 0744) Weight:  [199 lb 4.7 oz (90.4 kg)] 199 lb 4.7 oz (90.4 kg) (03/10 0405) Last BM Date: 03/04/15  Weight change: Filed Weights   03/03/15 0413 03/04/15 0441 03/05/15 0405  Weight: 211 lb 10.3 oz (96 kg) 203 lb 11.3 oz (92.4 kg) 199 lb 4.7 oz (90.4 kg)    Intake/Output:   Intake/Output Summary (Last 24 hours) at 03/05/15 0811 Last data filed at 03/05/15 0746  Gross per 24 hour  Intake   1279 ml  Output   5050 ml  Net  -3771 ml     Physical Exam: General:  In bed.  No resp difficulty HEENT: normal Neck: supple. JVP flat . Carotids 2+ bilat; no bruits. No lymphadenopathy or thryomegaly appreciated. LIJ.  Cor: PMI nondisplaced. Irregular tachy. No rubs, gallops or murmurs. Lungs: clear Abdomen: soft, nontender, nondistended. Focal hematoma/cyst in LUQ Extremities: no cyanosis, clubbing, rash, tr edema. Warm  Neuro: alert & orientedx3, cranial nerves grossly intact. moves all 4 extremities w/o difficulty. Affect pleasant  Telemetry: AF with RVR in 140-150s     Labs: Basic Metabolic Panel:  Recent Labs Lab 03/01/15 2310  03/02/15 1350 03/03/15 0431 03/04/15 0403 03/05/15 0500 03/05/15 0625  NA 137  < > 134* 136 132* 129* 134*  K 5.0  < > 5.0 4.1 3.4* 3.0* 3.1*  CL 105  < > 99 100 87* 72* 78*  CO2 25  < > 43* 46*  GLUCOSE 131*  < > 173* 136* 129* 406* 131*  BUN 30*  < > 39* 43* 51* 47* 48*  CREATININE 2.11*  < > 3.03* 3.31* 3.45* 2.80* 2.92*  CALCIUM 8.8  < > 8.3* 8.6 8.6 8.2* 9.0  MG 2.0  --   --   --   --   --   --   < > = values in this interval not displayed.  Liver Function Tests:  Recent Labs Lab 03/01/15 2310  AST 38*  ALT 35  ALKPHOS 66  BILITOT 0.6  PROT 6.5  ALBUMIN 3.2*   No results for input(s): LIPASE, AMYLASE in the last 168 hours. No results for input(s): AMMONIA in the last 168 hours.  CBC:  Recent Labs Lab 03/01/15 1010 03/01/15 2310 03/05/15 0500  WBC 6.1 7.2 7.1  NEUTROABS 4.0 5.3  --   HGB 15.8 15.5 14.3  HCT 49.0 47.4 42.3  MCV 92.6 90.5 86.9  PLT 191 138* 137*  Cardiac Enzymes:  Recent Labs Lab 03/01/15 1010 03/01/15 2310 03/02/15 0320 03/02/15 0516 03/02/15 1200  TROPONINI 0.13* 0.10* 0.10* 0.09* 0.10*    BNP: BNP (last 3 results)  Recent Labs  03/01/15 1010 03/01/15 2310  BNP 577.0* 693.5*    ProBNP (last 3 results) No results for input(s): PROBNP in the last 8760 hours.    Other results:  Imaging: No results found.   Medications:     Scheduled Medications: . apixaban  5 mg Oral BID  . insulin aspart  0-9 Units Subcutaneous TID WC  . sodium chloride  3 mL Intravenous Q12H  . torsemide  40 mg Oral BID    Infusions: . amiodarone 30 mg/hr (03/04/15 1100)  . amiodarone 30 mg/hr (03/05/15 0700)  . milrinone 0.125 mcg/kg/min (03/05/15 0700)    PRN Medications: sodium chloride, Place/Maintain arterial line **AND** sodium chloride, acetaminophen, ondansetron (ZOFRAN) IV, sodium chloride   Assessment:   1. Cardiogenic shock 2. Acute on  chronic systolic HF with probable severe biventricular failure (echo pending)  --suspect tachy induced 3. Acute on chronic renal failure stage 3  --likely due to ATN 4. Atrial flutter  --not on anticoag due to previous rectus sheath hematoma 2013 5. DM2 6. Hyperkalemia 7. Hypokalemia.   Plan/Discussion:    Suspect he has severe tachy-induced CM with cardiogenic shock physiology and marked volume overload.   Volume status low. CVP 2-3. Stop diuretics. Creatinine up a little. CO-OX 63%. Continue milrinone 0.125 mcg.   Continue amiodarone at 30 mg per hour. On Eliquis 5 mg twice a day. Plan for TEE-DC-CV today. Will need to watch closely had rectal sheath hematomain 2013.   Will consult CR.    Length of Stay: 4 CLEGG,AMY NP-C  03/05/2015, 8:11 AM  Advanced Heart Failure Team Pager [phone] (M-F; 7a - 4p)  Please contact [facility] for night-coverage after hours (4p -7a ) and weekends on [email or web address]  Patient seen and examined with [name], NP. We discussed all aspects of the encounter. I agree with the assessment and plan as stated above.   Remains with persistent AF with RVR despite significant IV amio load. Has gotten 2 doses of Eliquis, Will plan TEE and DC-CV today. Volume status down. Stop diuretics.   Truman Hayward 8:33 AM

## 2015-03-05 NOTE — Anesthesia Preprocedure Evaluation (Addendum)
Anesthesia Evaluation  Patient identified by MRN, date of birth, ID band Patient awake    Reviewed: Allergy & Precautions, NPO status , Patient's Chart, lab work & pertinent test results, reviewed documented beta blocker date and time   History of Anesthesia Complications Negative for: history of anesthetic complications  Airway Mallampati: II  TM Distance: >3 FB Neck ROM: Full    Dental  (+) Poor Dentition, Loose, Dental Advisory Given, Missing, Chipped   Pulmonary sleep apnea (assumed) ,  breath sounds clear to auscultation        Cardiovascular hypertension, Pt. on medications and Pt. on home beta blockers +CHF (recent cardiogenic shock, still on milrinone) + dysrhythmias Atrial Fibrillation Rhythm:Regular Rate:Tachycardia  03/01/15: ECHO: EF 20-25% with diffuse biventricular hypokinesis, grade 3 diastolic dysfunction, mod MR, mod TR   Neuro/Psych negative neurological ROS     GI/Hepatic negative GI ROS, Neg liver ROS,   Endo/Other  negative endocrine ROSDiabetes: glu 131.  Renal/GU ARFRenal disease (creat 2.92, K+ 3.1)     Musculoskeletal   Abdominal (+) + obese,   Peds  Hematology  (+) anemia ,   Anesthesia Other Findings EF 20-25%.   Reproductive/Obstetrics                          Anesthesia Physical Anesthesia Plan  ASA: IV  Anesthesia Plan: General   Post-op Pain Management:    Induction: Intravenous  Airway Management Planned: Oral ETT  Additional Equipment:   Intra-op Plan:   Post-operative Plan: Possible Post-op intubation/ventilation  Informed Consent: I have reviewed the patients History and Physical, chart, labs and discussed the procedure including the risks, benefits and alternatives for the proposed anesthesia with the patient or authorized representative who has indicated his/her understanding and acceptance.   Dental advisory given  Plan Discussed with: CRNA and  Surgeon  Anesthesia Plan Comments: (Plan routine monitors, GETA  Pt and wife understand dental damage is likely)        Anesthesia Quick Evaluation

## 2015-03-05 NOTE — Progress Notes (Signed)
 ADVANCED HF ROUNDING NOTE   Subjective:     68 y/o male with h/o presumed NICM (I cannot find any cath notes), DM2, CKD, PAF.   Admitted 3/6 with recurrent HF with marked volume overload in setting of recurrent AFL. Had prolonged hospitalization in 2013 due to cariogenic shock and AF and developed life-threatening rectus sheath hematoma so he has not been anticoagulated.  Ab u/s shows chronic hematoma in ab wall.   Initial co-ox39%. Off levophed. Yesterday he was transitioned torsemide. Milrinone continued at 0.125 mcg. He remains on amio at 30 mg per hour.  A fib RVR.    Weight down 4 pounds over night.  CVP 2 Co-ox 63%   Creatinine 2.9      Objective:   Weight Range:  Vital Signs:   Temp:  [98 F (36.7 C)-98.5 F (36.9 C)] 98.5 F (36.9 C) (03/10 0744) Pulse Rate:  [26-157] 143 (03/10 0744) Resp:  [13-27] 27 (03/10 0744) BP: (86-144)/(47-88) 94/50 mmHg (03/10 0744) SpO2:  [64 %-99 %] 93 % (03/10 0744) Weight:  [199 lb 4.7 oz (90.4 kg)] 199 lb 4.7 oz (90.4 kg) (03/10 0405) Last BM Date: 03/04/15  Weight change: Filed Weights   03/03/15 0413 03/04/15 0441 03/05/15 0405  Weight: 211 lb 10.3 oz (96 kg) 203 lb 11.3 oz (92.4 kg) 199 lb 4.7 oz (90.4 kg)    Intake/Output:   Intake/Output Summary (Last 24 hours) at 03/05/15 0811 Last data filed at 03/05/15 0746  Gross per 24 hour  Intake   1279 ml  Output   5050 ml  Net  -3771 ml     Physical Exam: General:  In bed.  No resp difficulty HEENT: normal Neck: supple. JVP flat . Carotids 2+ bilat; no bruits. No lymphadenopathy or thryomegaly appreciated. LIJ.  Cor: PMI nondisplaced. Irregular tachy. No rubs, gallops or murmurs. Lungs: clear Abdomen: soft, nontender, nondistended. Focal hematoma/cyst in LUQ Extremities: no cyanosis, clubbing, rash, tr edema. Warm  Neuro: alert & orientedx3, cranial nerves grossly intact. moves all 4 extremities w/o difficulty. Affect pleasant  Telemetry: AF with RVR in 140-150s     Labs: Basic Metabolic Panel:  Recent Labs Lab 03/01/15 2310  03/02/15 1350 03/03/15 0431 03/04/15 0403 03/05/15 0500 03/05/15 0625  NA 137  < > 134* 136 132* 129* 134*  K 5.0  < > 5.0 4.1 3.4* 3.0* 3.1*  CL 105  < > 99 100 87* 72* 78*  CO2 25  < > 25 28 31 43* 46*  GLUCOSE 131*  < > 173* 136* 129* 406* 131*  BUN 30*  < > 39* 43* 51* 47* 48*  CREATININE 2.11*  < > 3.03* 3.31* 3.45* 2.80* 2.92*  CALCIUM 8.8  < > 8.3* 8.6 8.6 8.2* 9.0  MG 2.0  --   --   --   --   --   --   < > = values in this interval not displayed.  Liver Function Tests:  Recent Labs Lab 03/01/15 2310  AST 38*  ALT 35  ALKPHOS 66  BILITOT 0.6  PROT 6.5  ALBUMIN 3.2*   No results for input(s): LIPASE, AMYLASE in the last 168 hours. No results for input(s): AMMONIA in the last 168 hours.  CBC:  Recent Labs Lab 03/01/15 1010 03/01/15 2310 03/05/15 0500  WBC 6.1 7.2 7.1  NEUTROABS 4.0 5.3  --   HGB 15.8 15.5 14.3  HCT 49.0 47.4 42.3  MCV 92.6 90.5 86.9  PLT 191 138* 137*      Cardiac Enzymes:  Recent Labs Lab 03/01/15 1010 03/01/15 2310 03/02/15 0320 03/02/15 0516 03/02/15 1200  TROPONINI 0.13* 0.10* 0.10* 0.09* 0.10*    BNP: BNP (last 3 results)  Recent Labs  03/01/15 1010 03/01/15 2310  BNP 577.0* 693.5*    ProBNP (last 3 results) No results for input(s): PROBNP in the last 8760 hours.    Other results:  Imaging: No results found.   Medications:     Scheduled Medications: . apixaban  5 mg Oral BID  . insulin aspart  0-9 Units Subcutaneous TID WC  . sodium chloride  3 mL Intravenous Q12H  . torsemide  40 mg Oral BID    Infusions: . amiodarone 30 mg/hr (03/04/15 1100)  . amiodarone 30 mg/hr (03/05/15 0700)  . milrinone 0.125 mcg/kg/min (03/05/15 0700)    PRN Medications: sodium chloride, Place/Maintain arterial line **AND** sodium chloride, acetaminophen, ondansetron (ZOFRAN) IV, sodium chloride   Assessment:   1. Cardiogenic shock 2. Acute on  chronic systolic HF with probable severe biventricular failure (echo pending)  --suspect tachy induced 3. Acute on chronic renal failure stage 3  --likely due to ATN 4. Atrial flutter  --not on anticoag due to previous rectus sheath hematoma 2013 5. DM2 6. Hyperkalemia 7. Hypokalemia.   Plan/Discussion:    Suspect he has severe tachy-induced CM with cardiogenic shock physiology and marked volume overload.   Volume status low. CVP 2-3. Stop diuretics. Creatinine up a little. CO-OX 63%. Continue milrinone 0.125 mcg.   Continue amiodarone at 30 mg per hour. On Eliquis 5 mg twice a day. Plan for TEE-DC-CV today. Will need to watch closely had rectal sheath hematomain 2013.   Will consult CR.    Length of Stay: 4 CLEGG,AMY NP-C  03/05/2015, 8:11 AM  Advanced Heart Failure Team Pager [phone] (M-F; 7a - 4p)  Please contact [facility] for night-coverage after hours (4p -7a ) and weekends on [email or web address]  Patient seen and examined with [name], NP. We discussed all aspects of the encounter. I agree with the assessment and plan as stated above.   Remains with persistent AF with RVR despite significant IV amio load. Has gotten 2 doses of Eliquis, Will plan TEE and DC-CV today. Volume status down. Stop diuretics.   Dorisann Schwanke,MD 8:33 AM     

## 2015-03-05 NOTE — Progress Notes (Signed)
  Echocardiogram Echocardiogram Transesophageal has been performed.  Cathie Beams 03/05/2015, 2:23 PM

## 2015-03-06 ENCOUNTER — Encounter (HOSPITAL_COMMUNITY): Payer: Self-pay | Admitting: Internal Medicine

## 2015-03-06 DIAGNOSIS — I480 Paroxysmal atrial fibrillation: Secondary | ICD-10-CM

## 2015-03-06 LAB — GLUCOSE, CAPILLARY
GLUCOSE-CAPILLARY: 119 mg/dL — AB (ref 70–99)
GLUCOSE-CAPILLARY: 141 mg/dL — AB (ref 70–99)
Glucose-Capillary: 130 mg/dL — ABNORMAL HIGH (ref 70–99)
Glucose-Capillary: 126 mg/dL — ABNORMAL HIGH (ref 70–99)

## 2015-03-06 LAB — CBC
HCT: 43.5 % (ref 39.0–52.0)
HEMOGLOBIN: 14.7 g/dL (ref 13.0–17.0)
MCH: 29.6 pg (ref 26.0–34.0)
MCHC: 33.8 g/dL (ref 30.0–36.0)
MCV: 87.5 fL (ref 78.0–100.0)
Platelets: 133 10*3/uL — ABNORMAL LOW (ref 150–400)
RBC: 4.97 MIL/uL (ref 4.22–5.81)
RDW: 13.7 % (ref 11.5–15.5)
WBC: 6.9 10*3/uL (ref 4.0–10.5)

## 2015-03-06 LAB — BASIC METABOLIC PANEL
ANION GAP: 8 (ref 5–15)
BUN: 43 mg/dL — ABNORMAL HIGH (ref 6–23)
CO2: 42 mmol/L — AB (ref 19–32)
CREATININE: 2.45 mg/dL — AB (ref 0.50–1.35)
Calcium: 8.1 mg/dL — ABNORMAL LOW (ref 8.4–10.5)
Chloride: 82 mmol/L — ABNORMAL LOW (ref 96–112)
GFR calc Af Amer: 30 mL/min — ABNORMAL LOW (ref >90)
GFR calc non Af Amer: 26 mL/min — ABNORMAL LOW (ref >90)
GLUCOSE: 119 mg/dL — AB (ref 70–99)
Potassium: 3.2 mmol/L — ABNORMAL LOW (ref 3.5–5.1)
Sodium: 132 mmol/L — ABNORMAL LOW (ref 135–145)

## 2015-03-06 LAB — CULTURE, BLOOD (ROUTINE X 2)
CULTURE: NO GROWTH
CULTURE: NO GROWTH

## 2015-03-06 LAB — CARBOXYHEMOGLOBIN
CARBOXYHEMOGLOBIN: 1 % (ref 0.5–1.5)
Methemoglobin: 0.9 % (ref 0.0–1.5)
O2 Saturation: 73.1 %
TOTAL HEMOGLOBIN: 15.3 g/dL (ref 13.5–18.0)

## 2015-03-06 LAB — MAGNESIUM: Magnesium: 1.8 mg/dL (ref 1.5–2.5)

## 2015-03-06 MED ORDER — POTASSIUM CHLORIDE CRYS ER 20 MEQ PO TBCR
20.0000 meq | EXTENDED_RELEASE_TABLET | ORAL | Status: AC
  Administered 2015-03-06 (×3): 20 meq via ORAL
  Filled 2015-03-06: qty 1

## 2015-03-06 MED ORDER — AMIODARONE HCL 200 MG PO TABS
400.0000 mg | ORAL_TABLET | Freq: Two times a day (BID) | ORAL | Status: DC
  Administered 2015-03-06 – 2015-03-07 (×3): 400 mg via ORAL
  Filled 2015-03-06 (×4): qty 2

## 2015-03-06 MED ORDER — TORSEMIDE 20 MG PO TABS
20.0000 mg | ORAL_TABLET | Freq: Two times a day (BID) | ORAL | Status: DC
  Administered 2015-03-06 – 2015-03-07 (×2): 20 mg via ORAL
  Filled 2015-03-06 (×4): qty 1

## 2015-03-06 NOTE — Progress Notes (Signed)
ADVANCED HF ROUNDING NOTE   Subjective:     69 y/o male with h/o presumed NICM (I cannot find any cath notes), DM2, CKD, PAF.  Had prolonged hospitalization in 2013 due to cariogenic shock and AF and developed life-threatening rectus sheath hematoma so he has not been anticoagulated.  Admitted 3/6 with recurrent HF and a/c renal failure with marked volume overload in setting of recurrent AFL. EF 15%. Initial co-ox39% Started on milrinone and levophed with improvement. Diuresed well. Course notable for persistent AF with RVR despite IV amio.  Ab u/s shows chronic hematoma in ab wall. Started Eliquis.  3/10 underwent TEE/DC-CV. Ranexa added to amio given high risk of AF recurrence.   Remains in NSR. Creatinine improving. Feels good. Remains on milrinone 0.125.CVP 5. Co-ox 73%   Objective:   Weight Range:  Vital Signs:   Temp:  [97.6 F (36.4 C)-98.5 F (36.9 C)] 98 F (36.7 C) (03/10 2314) Pulse Rate:  [27-157] 72 (03/10 2314) Resp:  [14-27] 19 (03/10 2314) BP: (84-118)/(50-71) 92/52 mmHg (03/10 2314) SpO2:  [87 %-100 %] 100 % (03/10 2314) Weight:  [90.4 kg (199 lb 4.7 oz)] 90.4 kg (199 lb 4.7 oz) (03/10 0405) Last BM Date: 03/04/15  Weight change: Filed Weights   03/03/15 0413 03/04/15 0441 03/05/15 0405  Weight: 96 kg (211 lb 10.3 oz) 92.4 kg (203 lb 11.3 oz) 90.4 kg (199 lb 4.7 oz)    Intake/Output:   Intake/Output Summary (Last 24 hours) at 03/06/15 0040 Last data filed at 03/05/15 2316  Gross per 24 hour  Intake 1079.93 ml  Output   2000 ml  Net -920.07 ml     Physical Exam: General:  In bed.  No resp difficulty HEENT: normal Neck: supple. JVP 6 Carotids 2+ bilat; no bruits. No lymphadenopathy or thryomegaly appreciated. LIJ.  Cor: PMI nondisplaced. RRR. No rubs, gallops or murmurs. Lungs: clear Abdomen: soft, nontender, nondistended. Focal hematoma/cyst in LUQ Extremities: no cyanosis, clubbing, rash, 1+ edema. Warm  Neuro: alert & orientedx3, cranial  nerves grossly intact. moves all 4 extremities w/o difficulty. Affect pleasant  Telemetry: NSR with PACs.  Labs: Basic Metabolic Panel:  Recent Labs Lab 03/01/15 2310  03/02/15 1350 03/03/15 0431 03/04/15 0403 03/05/15 0500 03/05/15 0625  NA 137  < > 134* 136 132* 129* 134*  K 5.0  < > 5.0 4.1 3.4* 3.0* 3.1*  CL 105  < > 99 100 87* 72* 78*  CO2 25  < > 25 28 31  43* 46*  GLUCOSE 131*  < > 173* 136* 129* 406* 131*  BUN 30*  < > 39* 43* 51* 47* 48*  CREATININE 2.11*  < > 3.03* 3.31* 3.45* 2.80* 2.92*  CALCIUM 8.8  < > 8.3* 8.6 8.6 8.2* 9.0  MG 2.0  --   --   --   --   --   --   < > = values in this interval not displayed.  Liver Function Tests:  Recent Labs Lab 03/01/15 2310  AST 38*  ALT 35  ALKPHOS 66  BILITOT 0.6  PROT 6.5  ALBUMIN 3.2*   No results for input(s): LIPASE, AMYLASE in the last 168 hours. No results for input(s): AMMONIA in the last 168 hours.  CBC:  Recent Labs Lab 03/01/15 1010 03/01/15 2310 03/05/15 0500  WBC 6.1 7.2 7.1  NEUTROABS 4.0 5.3  --   HGB 15.8 15.5 14.3  HCT 49.0 47.4 42.3  MCV 92.6 90.5 86.9  PLT 191 138* 137*  Cardiac Enzymes:  Recent Labs Lab 03/01/15 1010 03/01/15 2310 03/02/15 0320 03/02/15 0516 03/02/15 1200  TROPONINI 0.13* 0.10* 0.10* 0.09* 0.10*    BNP: BNP (last 3 results)  Recent Labs  03/01/15 1010 03/01/15 2310  BNP 577.0* 693.5*    ProBNP (last 3 results) No results for input(s): PROBNP in the last 8760 hours.    Other results:  Imaging: No results found.   Medications:     Scheduled Medications: . apixaban  5 mg Oral BID  . fentaNYL      . insulin aspart  0-9 Units Subcutaneous TID WC  . ranolazine  500 mg Oral BID  . sodium chloride  3 mL Intravenous Q12H    Infusions: . sodium chloride    . amiodarone 30 mg/hr (03/05/15 1922)  . milrinone 0.125 mcg/kg/min (03/05/15 2027)    PRN Medications: sodium chloride, Place/Maintain arterial line **AND** sodium chloride,  acetaminophen, ondansetron (ZOFRAN) IV, sodium chloride   Assessment:   1. Cardiogenic shock 2. Acute on chronic systolic HF withsevere biventricular failure EF 20%  --suspect tachy induced 3. Acute on chronic renal failure stage 3 (baseline 1.5-1.6)  --likely due to ATN 4. Atrial fib/flutter  --not on anticoag due to previous rectus sheath hematoma 2013    --S/p TEE/DC-CV 3/10 5. DM2 6. Hyperkalemia 7. Hypokalemia.   Plan/Discussion:    Much improved. Maintaining NSR s/p DC-CV on 3/10. Volume status looks good. Continue amio/Ranexa/Eliquis. Supp K+.  Will stop milrinone. Resume po diuretics. Hopefully home over the weekend.   Cardiac rehab to ambulate. Keep in SDU to measure CVP and co-ox in am.   Truman Hayward 12:40 AM Advanced Heart Failure Team Pager [phone] (M-F; 7a - 4p)  Please contact [facility] for night-coverage after hours (4p -7a ) and weekends on [email or web address]

## 2015-03-06 NOTE — Progress Notes (Signed)
CARDIAC REHAB PHASE I   PRE:  Rate/Rhythm: 88 SR  BP:  Supine:   Sitting: 115/77  Standing:    SaO2: 93%RA  MODE:  Ambulation: 270 ft   POST:  Rate/Rhythm: 97 SR  BP:  Supine:   Sitting: 134/72  Standing:    SaO2: 95%RA 1325-1352 Pt hard to get to standing but once up, did well. Pt walked 270 ft on RA with gait belt use, rolling walker and asst x 2. Pt wanted to walk a few feet without walker and did well with [place] still assisting. Can be asst x 1 next walk. No c/o SOB. To recliner after walk with call bell. Will leave off oxygen. Luetta Nutting, RN BSN  03/06/2015 1:50 PM

## 2015-03-06 NOTE — Progress Notes (Signed)
Patient ID: Martin [name], male   DOB: Dec 26, 1946, 69 y.o.   MRN: [id number]  [facility] Progress Note    Assessment/ Plan:   1. Acute renal failure on chronic kidney disease stage III: Diuretics stopped yesterday due to low CVP/evidence of intravascular contraction. Urine output decreased and appears to have been net negative overnight. Still with evidence of contraction alkalosis/depletion hypokalemia from recent diuretic therapy-may be beneficial to try acetazolamide. 2. Hypokalemia: Secondary to brisk urine output/diuretic therapy overnight- ongoing replacement ordered by cardiology service (check magnesium level) 3. Cardiogenic shock: Ongoing support with low dose milrinone 4. CHF exacerbation: Excellent response to diuretics-currently on hold due to low CVP 5. Atrial flutter: Status post TEE cardioversion done yesterday-good rate control overnight, now on amiodarone. On anticoagulation with Eliquis  Subjective:   Reports to feeling better-inquires if he can walk around the hallways today    Objective:   BP 100/71 mmHg  Pulse 80  Temp(Src) 97.8 F (36.6 C) (Oral)  Resp 22  Ht 5\' 5"  (1.651 m)  Wt 91.3 kg (201 lb 4.5 oz)  BMI 33.49 kg/m2  SpO2 96%  Intake/Output Summary (Last 24 hours) at 03/06/15 0827 Last data filed at 03/06/15 0801  Gross per 24 hour  Intake 1210.73 ml  Output   1150 ml  Net  60.73 ml   Weight change: 0.9 kg (1 lb 15.8 oz)  Physical Exam: Gen: Comfortably sitting up in bed, eating breakfast CVS: Pulse regular in rate and rhythm Resp: Clear to auscultation, no distinct rales Abd: Soft, obese, nontender Ext: 2+ lower extremity edema  Imaging: No results found.  Labs: BMET  Recent Labs Lab 03/02/15 0516 03/02/15 1350 03/03/15 0431 03/04/15 0403 03/05/15 0500 03/05/15 0625 03/06/15 0435  NA 137 134* 136 132* 129* 134* 132*  K 5.4* 5.0 4.1 3.4* 3.0* 3.1* 3.2*  CL 107 99 100 87* 72* 78* 82*  CO2 19 25 28 31  43* 46* 42*   GLUCOSE 143* 173* 136* 129* 406* 131* 119*  BUN 34* 39* 43* 51* 47* 48* 43*  CREATININE 2.52* 3.03* 3.31* 3.45* 2.80* 2.92* 2.45*  CALCIUM 8.8 8.3* 8.6 8.6 8.2* 9.0 8.1*   CBC  Recent Labs Lab 03/01/15 1010 03/01/15 2310 03/05/15 0500 03/06/15 0435  WBC 6.1 7.2 7.1 6.9  NEUTROABS 4.0 5.3  --   --   HGB 15.8 15.5 14.3 14.7  HCT 49.0 47.4 42.3 43.5  MCV 92.6 90.5 86.9 87.5  PLT 191 138* 137* 133*    Medications:    . apixaban  5 mg Oral BID  . insulin aspart  0-9 Units Subcutaneous TID WC  . ranolazine  500 mg Oral BID  . sodium chloride  3 mL Intravenous Q12H   Zetta Bills, MD 03/06/2015, 8:27 AM

## 2015-03-07 DIAGNOSIS — N189 Chronic kidney disease, unspecified: Secondary | ICD-10-CM

## 2015-03-07 LAB — GLUCOSE, CAPILLARY
Glucose-Capillary: 81 mg/dL (ref 70–99)
Glucose-Capillary: 158 mg/dL — ABNORMAL HIGH (ref 70–99)

## 2015-03-07 LAB — CARBOXYHEMOGLOBIN
Carboxyhemoglobin: 1.6 % — ABNORMAL HIGH (ref 0.5–1.5)
METHEMOGLOBIN: 1 % (ref 0.0–1.5)
O2 SAT: 61.8 %
TOTAL HEMOGLOBIN: 16.8 g/dL (ref 13.5–18.0)

## 2015-03-07 LAB — CBC
HEMATOCRIT: 45.1 % (ref 39.0–52.0)
Hemoglobin: 15.4 g/dL (ref 13.0–17.0)
MCH: 30.1 pg (ref 26.0–34.0)
MCHC: 34.1 g/dL (ref 30.0–36.0)
MCV: 88.3 fL (ref 78.0–100.0)
Platelets: 140 10*3/uL — ABNORMAL LOW (ref 150–400)
RBC: 5.11 MIL/uL (ref 4.22–5.81)
RDW: 13.7 % (ref 11.5–15.5)
WBC: 7.5 10*3/uL (ref 4.0–10.5)

## 2015-03-07 LAB — BASIC METABOLIC PANEL
ANION GAP: 12 (ref 5–15)
BUN: 49 mg/dL — ABNORMAL HIGH (ref 6–23)
CHLORIDE: 83 mmol/L — AB (ref 96–112)
CO2: 38 mmol/L — ABNORMAL HIGH (ref 19–32)
CREATININE: 2.34 mg/dL — AB (ref 0.50–1.35)
Calcium: 8.8 mg/dL (ref 8.4–10.5)
GFR calc Af Amer: 32 mL/min — ABNORMAL LOW (ref >90)
GFR calc non Af Amer: 27 mL/min — ABNORMAL LOW (ref >90)
Glucose, Bld: 183 mg/dL — ABNORMAL HIGH (ref 70–99)
Potassium: 3.4 mmol/L — ABNORMAL LOW (ref 3.5–5.1)
SODIUM: 133 mmol/L — AB (ref 135–145)

## 2015-03-07 MED ORDER — TORSEMIDE 20 MG PO TABS: 20.0000 mg | ORAL_TABLET | Freq: Two times a day (BID) | ORAL | Status: DC

## 2015-03-07 MED ORDER — APIXABAN 5 MG PO TABS
5.0000 mg | ORAL_TABLET | Freq: Two times a day (BID) | ORAL | Status: DC
Start: 2015-03-07 — End: 2015-03-10

## 2015-03-07 MED ORDER — POTASSIUM CHLORIDE CRYS ER 20 MEQ PO TBCR: 40.0000 meq | EXTENDED_RELEASE_TABLET | Freq: Once | ORAL | Status: DC

## 2015-03-07 MED ORDER — POTASSIUM CHLORIDE CRYS ER 20 MEQ PO TBCR: 40.0000 meq | EXTENDED_RELEASE_TABLET | Freq: Two times a day (BID) | ORAL | Status: DC

## 2015-03-07 MED ORDER — RANOLAZINE ER 500 MG PO TB12
500.0000 mg | ORAL_TABLET | Freq: Two times a day (BID) | ORAL | Status: DC
Start: 2015-03-07 — End: 2015-03-10

## 2015-03-07 MED ORDER — POTASSIUM CHLORIDE CRYS ER 20 MEQ PO TBCR
40.0000 meq | EXTENDED_RELEASE_TABLET | Freq: Two times a day (BID) | ORAL | Status: DC
  Administered 2015-03-07: 40 meq via ORAL
  Filled 2015-03-07: qty 2

## 2015-03-07 MED ORDER — AMIODARONE HCL 400 MG PO TABS: 400.0000 mg | ORAL_TABLET | Freq: Two times a day (BID) | ORAL | Status: DC

## 2015-03-07 NOTE — Progress Notes (Signed)
Patient ID: Martin [name], male   DOB: [birth year], 69 y.o.   MRN: [id number]  [facility] Progress Note    Assessment/ Plan:   1. Acute renal failure on chronic kidney disease stage III: Renal function continues to improve slowly as CHF treated-fortunately, responsive to diuretic therapy. We'll replace electrolyte depletion-monitor metabolic alkalosis closely. Was restarted back on torsemide 20 mg twice a day 2. Hypokalemia: Secondary to diuretic therapy-oral replacement ordered. Magnesium stores appear to be replete. He will likely need daily potassium replacement (would suggest K Dur 20 mEq twice a day) with close monitoring. 3. Cardiogenic shock: Improved status post TEE cardioversion and treatment of heart failure. 4. CHF exacerbation: Excellent response to diuretics-currently on hold due to low CVP 5. Atrial flutter: Status post TEE cardioversion done yesterday-good rate control overnight, now on amiodarone. On anticoagulation with Eliquis  Subjective:   Reports to be feeling well-able to ambulate with assistance yesterday. Inquires about discharge    Objective:   BP 104/69 mmHg  Pulse 66  Temp(Src) 97.8 F (36.6 C) (Oral)  Resp 24  Ht 5\' 5"  (1.651 m)  Wt 88 kg (194 lb 0.1 oz)  BMI 32.28 kg/m2  SpO2 96%  Intake/Output Summary (Last 24 hours) at 03/07/15 8616 Last data filed at 03/07/15 0600  Gross per 24 hour  Intake 1183.75 ml  Output   2300 ml  Net -1116.25 ml   Weight change: -3.3 kg (-7 lb 4.4 oz)  Physical Exam: Gen: Comfortably resting in bed CVS: Pulse regular in rate and rhythm Resp: Decreased breath sounds over bases otherwise clear Abd: Soft, protuberant, bowel sounds normal Ext: 2+ lower extremity edema  Imaging: No results found.  Labs: BMET  Recent Labs Lab 03/02/15 1350 03/03/15 0431 03/04/15 0403 03/05/15 0500 03/05/15 0625 03/06/15 0435 03/07/15 0430  NA 134* 136 132* 129* 134* 132* 133*  K 5.0 4.1 3.4* 3.0* 3.1* 3.2* 3.4*   CL 99 100 87* 72* 78* 82* 83*  CO2 25 28 31  43* 46* 42* 38*  GLUCOSE 173* 136* 129* 406* 131* 119* 183*  BUN 39* 43* 51* 47* 48* 43* 49*  CREATININE 3.03* 3.31* 3.45* 2.80* 2.92* 2.45* 2.34*  CALCIUM 8.3* 8.6 8.6 8.2* 9.0 8.1* 8.8   CBC  Recent Labs Lab 03/01/15 1010 03/01/15 2310 03/05/15 0500 03/06/15 0435 03/07/15 0430  WBC 6.1 7.2 7.1 6.9 7.5  NEUTROABS 4.0 5.3  --   --   --   HGB 15.8 15.5 14.3 14.7 15.4  HCT 49.0 47.4 42.3 43.5 45.1  MCV 92.6 90.5 86.9 87.5 88.3  PLT 191 138* 137* 133* 140*    Medications:    . amiodarone  400 mg Oral BID  . apixaban  5 mg Oral BID  . insulin aspart  0-9 Units Subcutaneous TID WC  . potassium chloride  40 mEq Oral BID  . ranolazine  500 mg Oral BID  . sodium chloride  3 mL Intravenous Q12H  . torsemide  20 mg Oral BID   Zetta Bills, MD 03/07/2015, 8:08 AM

## 2015-03-07 NOTE — Progress Notes (Signed)
ADVANCED HF ROUNDING NOTE   Subjective:     69 y/o male with h/o presumed NICM (I cannot find any cath notes), DM2, CKD, PAF.  Had prolonged hospitalization in 2013 due to cariogenic shock and AF and developed life-threatening rectus sheath hematoma so he has not been anticoagulated.  Admitted 3/6 with recurrent HF and a/c renal failure with marked volume overload in setting of recurrent AFL. EF 15%. Initial co-ox39% Started on milrinone and levophed with improvement. Diuresed well. Course notable for persistent AF with RVR despite IV amio.  Ab u/s shows chronic hematoma in ab wall. Started Eliquis.  3/10 underwent TEE/DC-CV. Ranexa added to amio given high risk of AF recurrence.   Weight down. Feels good. CVP 4. Renal function stable. Wants to go home.    Objective:   Weight Range:  Vital Signs:   Temp:  [97.4 F (36.3 C)-98.5 F (36.9 C)] 98.5 F (36.9 C) (03/12 0806) Pulse Rate:  [66-78] 66 (03/12 0806) Resp:  [20-24] 24 (03/12 0806) BP: (92-115)/(64-80) 103/64 mmHg (03/12 0806) SpO2:  [93 %-96 %] 96 % (03/12 0806) Weight:  [88 kg (194 lb 0.1 oz)] 88 kg (194 lb 0.1 oz) (03/12 0300) Last BM Date: 03/04/15  Weight change: Filed Weights   03/05/15 0405 03/06/15 0317 03/07/15 0300  Weight: 90.4 kg (199 lb 4.7 oz) 91.3 kg (201 lb 4.5 oz) 88 kg (194 lb 0.1 oz)    Intake/Output:   Intake/Output Summary (Last 24 hours) at 03/07/15 1147 Last data filed at 03/07/15 0600  Gross per 24 hour  Intake 943.75 ml  Output   2300 ml  Net -1356.25 ml     Physical Exam: General:  In chair  No resp difficulty HEENT: normal Neck: supple. JVP 4-5 Carotids 2+ bilat; no bruits. No lymphadenopathy or thryomegaly appreciated. LIJ.  Cor: PMI nondisplaced. RRR. No rubs, gallops or murmurs. Lungs: clear Abdomen: soft, nontender, nondistended. Focal hematoma/cyst in LUQ Extremities: no cyanosis, clubbing, rash, tr edema. Warm  Neuro: alert & orientedx3, cranial nerves grossly intact.  moves all 4 extremities w/o difficulty. Affect pleasant  Telemetry: NSR 60s  Labs: Basic Metabolic Panel:  Recent Labs Lab 03/01/15 2310  03/04/15 0403 03/05/15 0500 03/05/15 0625 03/06/15 0435 03/07/15 0430  NA 137  < > 132* 129* 134* 132* 133*  K 5.0  < > 3.4* 3.0* 3.1* 3.2* 3.4*  CL 105  < > 87* 72* 78* 82* 83*  CO2 25  < > 31 43* 46* 42* 38*  GLUCOSE 131*  < > 129* 406* 131* 119* 183*  BUN 30*  < > 51* 47* 48* 43* 49*  CREATININE 2.11*  < > 3.45* 2.80* 2.92* 2.45* 2.34*  CALCIUM 8.8  < > 8.6 8.2* 9.0 8.1* 8.8  MG 2.0  --   --   --   --  1.8  --   < > = values in this interval not displayed.  Liver Function Tests:  Recent Labs Lab 03/01/15 2310  AST 38*  ALT 35  ALKPHOS 66  BILITOT 0.6  PROT 6.5  ALBUMIN 3.2*   No results for input(s): LIPASE, AMYLASE in the last 168 hours. No results for input(s): AMMONIA in the last 168 hours.  CBC:  Recent Labs Lab 03/01/15 1010 03/01/15 2310 03/05/15 0500 03/06/15 0435 03/07/15 0430  WBC 6.1 7.2 7.1 6.9 7.5  NEUTROABS 4.0 5.3  --   --   --   HGB 15.8 15.5 14.3 14.7 15.4  HCT 49.0 47.4  42.3 43.5 45.1  MCV 92.6 90.5 86.9 87.5 88.3  PLT 191 138* 137* 133* 140*    Cardiac Enzymes:  Recent Labs Lab 03/01/15 1010 03/01/15 2310 03/02/15 0320 03/02/15 0516 03/02/15 1200  TROPONINI 0.13* 0.10* 0.10* 0.09* 0.10*    BNP: BNP (last 3 results)  Recent Labs  03/01/15 1010 03/01/15 2310  BNP 577.0* 693.5*    ProBNP (last 3 results) No results for input(s): PROBNP in the last 8760 hours.    Other results:  Imaging: No results found.   Medications:     Scheduled Medications: . amiodarone  400 mg Oral BID  . apixaban  5 mg Oral BID  . insulin aspart  0-9 Units Subcutaneous TID WC  . potassium chloride  40 mEq Oral BID  . ranolazine  500 mg Oral BID  . sodium chloride  3 mL Intravenous Q12H  . torsemide  20 mg Oral BID    Infusions: . sodium chloride      PRN Medications: sodium  chloride, Place/Maintain arterial line **AND** sodium chloride, acetaminophen, ondansetron (ZOFRAN) IV, sodium chloride   Assessment:   1. Cardiogenic shock 2. Acute on chronic systolic HF withsevere biventricular failure EF 20%  --suspect tachy induced 3. Acute on chronic renal failure stage 3 (baseline 1.5-1.6)  --likely due to ATN 4. Atrial fib/flutter  --not on anticoag due to previous rectus sheath hematoma 2013    --S/p TEE/DC-CV 3/10 5. DM2 6. Hyperkalemia 7. Hypokalemia.   Plan/Discussion:    Much improved. Stable off milrinone. Maintaining NSR s/p DC-CV on 3/10. Volume status looks good. Continue amio/Ranexa/Eliquis. Supp K+.  Can go home today. Will check with CM re meds.   Daniel Bensimhon,MD 11:47 AM Advanced Heart Failure Team Pager [phone] (M-F; 7a - 4p)  Please contact [facility] for night-coverage after hours (4p -7a ) and weekends on [email or web address]

## 2015-03-07 NOTE — Discharge Summary (Signed)
Advanced Heart Failure Team  Discharge Summary   Patient ID: Martin [name] MRN: [id number], DOB/AGE: [birth year] 69 y.o. Admit date: 03/01/2015 D/C date:     03/07/2015   Primary Discharge Diagnoses:  1. Acute on chronic systolic heart failure with cardiogenic shock    --LVEF 15% 2. Acute on chronic renal failure due to cardiorenal syndrome    --peak creatinine 3.5. (baseline 1.60 3. Atrial fibrillation     --s/p DC-CV 3/10 4. History of rectus sheath hematoma with coumadin 5. DM2     Hospital Course:   69 y/o male with h/o presumed NICM, DM2, CKD, PAF. Had prolonged hospitalization in 2013 due to cardiogenic shock and AF and developed life-threatening rectus sheath hematoma so he has not been anticoagulated.  Admitted to [facility] on 3/6 with recurrent HF and a/c renal failure with marked volume overload in setting of recurrent AFL. EF 15%. Transferred to [facility]. Initial co-ox39% With CVP 25. Started on milrinone and levophed with improvement. Diuresed well. Course notable for persistent AF with RVR despite IV amio. Ab u/s shows chronic hematoma in ab wall. After long talk about risks and benefits of NOAC therapy and need for DC-CV he was started on. Eliquis. On  3/10 underwent TEE/DC-CV. Ranexa added to amio given high risk of AF recurrence.   Did very well creatinine peaked at 3.5 but came down to 2.3 on day of discharge. Ambulated well with cardiac rehab. Weight on d/c was 194 (admit was 212). Was not started on ACE or b-blocker due to renal failure and low BP.   On discharge patient provided with medication assistance to help with amidarone, ranexa and torsemide.   Discharge Vitals: Blood pressure 103/64, pulse 66, temperature 98.5 F (36.9 C), temperature source Axillary, resp. rate 24, height  (1.651 m), weight 88 kg (194 lb 0.1 oz), SpO2 96 %.  Labs: Lab Results  Component Value Date   WBC 7.5 03/07/2015   HGB 15.4 03/07/2015   HCT 45.1 03/07/2015   MCV 88.3 03/07/2015   PLT  140* 03/07/2015    Recent Labs Lab 03/01/15 2310  03/07/15 0430  NA 137  < > 133*  K 5.0  < > 3.4*  CL 105  < > 83*  CO2 25  < > 38*  BUN 30*  < > 49*  CREATININE 2.11*  < > 2.34*  CALCIUM 8.8  < > 8.8  PROT 6.5  --   --   BILITOT 0.6  --   --   ALKPHOS 66  --   --   ALT 35  --   --   AST 38*  --   --   GLUCOSE 131*  < > 183*  < > = values in this interval not displayed. No results found for: CHOL, HDL, LDLCALC, TRIG BNP (last 3 results)  Recent Labs  03/01/15 1010 03/01/15 2310  BNP 577.0* 693.5*    ProBNP (last 3 results) No results for input(s): PROBNP in the last 8760 hours.   Diagnostic Studies/Procedures   No results found.  Discharge Medications     Medication List    ASK your doctor about these medications        carvedilol 6.25 MG tablet  Commonly known as:  COREG  Take 1 tablet (6.25 mg total) by mouth 2 (two) times daily with a meal.     furosemide 40 MG tablet  Commonly known as:  LASIX  Take 1 tablet (40 mg total) by mouth 2 (two)  times daily.        Disposition   The patient will be discharged in stable condition to home.     F/u in HF Clinic in 1 week then can f/u in [facility] office.   D/c meds Amio 400 bid Ranexa 500 bid Torsemide 20 bid Kcl 20 daily   Duration of Discharge Encounter: Greater than 35 minutes   Signed, [name] MD 03/07/2015, 11:57 AM

## 2015-03-07 NOTE — Discharge Instructions (Signed)

## 2015-03-07 NOTE — Progress Notes (Signed)
CARDIAC REHAB PHASE I   PRE:  Rate/Rhythm: 68 SR  BP:  Supine: 99/68  Sitting: 105/64  Standing:    SaO2: 97%RA  MODE:  Ambulation: 700 ft   POST:  Rate/Rhythm: 93 SR  BP:  Supine:   Sitting: 126/82  Standing:    SaO2: 96%RA 1038-1112 Pt walked 700 ft on RA with rolling walker and asst x 2. Gait steady and pt tolerated well. Motivated to go farther today. To recliner with call bell. Left on RA. No dizziness, tired by end of walk. Can be asst x 1 with walker.   Luetta Nutting, RN BSN  03/07/2015 11:07 AM

## 2015-03-08 NOTE — Discharge Summary (Signed)
Physician Discharge Summary     Cardiologist:   Patient ID: Martin [name] MRN: [id number] DOB/AGE: [birth year] 69 y.o.  Admit date: 03/01/2015 Discharge date: 03/08/2015  Admission Diagnoses:  Discharge Diagnoses:  Principal Problem:   Cardiogenic shock Active Problems:   Atrial fibrillation   CHF exacerbation   Acute systolic HF (heart failure)   Acute kidney injury   Atrial flutter, unspecified   Discharged Condition: stable  Hospital Course:   Ngai Parcell is a 69 y.o. male with a PMHx of atrial fibrillation not on anticoagulation, CHF, HTN, prior history of cardiogenic shock in 2013 who presented to the [facility] ER complaining of moderate shortness of breath that began 3 weeks ago. Worse with exertion and lying flat. He reports associated edema in feet bilaterally. He denies associated cough, fever, chest palpitations, or chest pain. He states that he stopped taking Lasix 1 year ago due to it causing him abdominal discomfort. He was not able to be compliant with any other medications. He has not been following with [facility], last seen by Dr. Diona Browner in 2013.   He HR was found to be in 150s with stable BP initially but at OSH he received labetolol and became hypotensive with elevated lactate. Upon arrival to [facility], HR at 150 bpm, consistent with Atach (not afib), his BP is stable and he denied any chest pain but complain of SOB and LE swelling, as well as abdominal mass (hernia) first developed couple of years ago when he was started with anticoagulation  Patient was admitted for aggressive diuresis. He was started on Lasix 60 mg IV twice daily, milrinone 0.125 g. He ultimately diuresed 9.4 L and was transitioned to torsemide 20 mg twice daily.  He was started on IV amiodarone and later transitioned to by mouth 400 mg twice daily.  He was started on Eliquis.  He underwent TEE which revealed no clot in the atrial appendage.  He subsequently underwent cardioversion with a single  synchronized 200 J shock with prompt conversion to sinus rhythm with frequent PACs(see full procedure note below).  Ejection fraction 10-15% with global hypokinesis.  Right ventricle is dilated with severe global hypokinesis as well.  We also added Ranexa. Potassium was repleted. Nephrology was consulted for acute renal failure.  The patient was seen by Dr. Gala Romney who felt he was stable for DC home.  Heart failure follow-up arranged.   Consults: Nephrology  Significant Diagnostic Studies:   TRANSESOPHAGEAL ECHOCARDIOGRAM GUIDED DIRECT CURRENT CARDIOVERSION  NAME: Martin BroadnaxMRN:[id number] DOB: January 22, 1947ADMIT DATE: 03/01/2015  INDICATIONS: Rapid AF   PROCEDURE:   Informed consent was obtained prior to the procedure. The risks, benefits and alternatives for the procedure were discussed and the patient comprehended these risks. Risks include, but are not limited to, cough, sore throat, vomiting, nausea, somnolence, esophageal and stomach trauma or perforation, bleeding, low blood pressure, aspiration, pneumonia, infection, trauma to the teeth and death.   After a procedural time-out, the patient was given 3 mg versed and 75 mcg fentanyl for moderate sedation. The oropharynx was anesthetized 10 cc of topical 1% viscous lidocaine. The transesophageal probe was inserted in the esophagus and stomach without difficulty and multiple views were obtained.    FINDINGS:  LEFT VENTRICLE: Dilated. EF = 10-15% Global HK  RIGHT VENTRICLE: Dilated. Severe global HK  LEFT ATRIUM: Severely dialted  LEFT ATRIAL APPENDAGE: No clot  RIGHT ATRIUM: Dilated  AORTIC VALVE: Trileaflet. No AI/AS  MITRAL VALVE: Normal. Mild posterior MR  TRICUSPID VALVE: Normal Trivial TR  PULMONIC VALVE: Normal No significant PI  INTERATRIAL SEPTUM: No PFO/ASD  PERICARDIUM: No effusion  DESCENDING AORTA: mild  plaque   CARDIOVERSION:   Procedure Details:  Once the TEE was complete, the patient had the defibrillator pads placed in the anterior and posterior position. The patient then underwent further sedation by the anesthesia service for cardioversion . Once an appropriate level of sedation was achieved, the patient received a single biphasic, synchronized 200J shock with prompt conversion to sinus rhythm with frequent PACs. No apparent complications.  COMPLICATIONS:   There were no immediate complications.  CONCLUSION:   1. Successful TEE guided cardioversion.  Daniel Bensimhon,MD   Treatments: See above  Discharge Exam: Blood pressure 96/61, pulse 69, temperature 98.4 F (36.9 C), temperature source Oral, resp. rate 22, height 5\' 5"  (1.651 m), weight 194 lb 0.1 oz (88 kg), SpO2 96 %.   Disposition: 01-Home or Self Care  Discharge Instructions    Diet - low sodium heart healthy    Complete by:  As directed      Discharge instructions    Complete by:  As directed   Monitor your weight every morning.  If you gain 3 pounds in 24 hours, or 5 pounds in a week, call the office for instructions.     Increase activity slowly    Complete by:  As directed             Medication List    STOP taking these medications        carvedilol 6.25 MG tablet  Commonly known as:  COREG     furosemide 40 MG tablet  Commonly known as:  LASIX      TAKE these medications        amiodarone 400 MG tablet  Commonly known as:  PACERONE  Take 1 tablet (400 mg total) by mouth 2 (two) times daily.     apixaban 5 MG Tabs tablet  Commonly known as:  ELIQUIS  Take 1 tablet (5 mg total) by mouth 2 (two) times daily.     potassium chloride SA 20 MEQ tablet  Commonly known as:  K-DUR,KLOR-CON  Take 2 tablets (40 mEq total) by mouth 2 (two) times daily.     ranolazine 500 MG 12 hr tablet  Commonly known as:  RANEXA  Take 1 tablet (500 mg total) by mouth 2 (two) times daily.      torsemide 20 MG tablet  Commonly known as:  DEMADEX  Take 1 tablet (20 mg total) by mouth 2 (two) times daily.           Follow-up Information    Follow up with [name], MD.   Specialty:  Cardiology   Why:  The office will call you with the follow up appt date and time.    Contact information:   [address] [place] [address] [phone]       Follow up with [facility].   Why:  GO TO THE CLINIC ANY WEEKDAY MORNING from 9-10 am and ask for an appointment with a NAVIGATOR to get a medication insurance plan; an appointment to get a primary care physician, and a follow up medical appointment.   Contact information:   [address] [place] [address] [phone]    Greater than 30 minutes was spent completing the patient's discharge.    SignedWilburt Finlay, PAC 03/08/2015, 10:12 AM

## 2015-03-09 ENCOUNTER — Telehealth (HOSPITAL_COMMUNITY): Payer: Self-pay

## 2015-03-09 NOTE — Telephone Encounter (Signed)
Wife calle don behalf of patient, stating he is having a severe rash/reaction to new medications since hospital discharge on Saturday.  Rash and severe itching to back.  Unsure which medications could be causing this as there are multiple new ones.  Post hospital appointment made for patient this week.  Encouraged to take medications as prescribed today as long as he does not have AMS, difficulty breathing and/or his throat/mouth does not swell which they report has not happened, just his back is broke out.  Aware and agreeable.  Ave Filter

## 2015-03-10 ENCOUNTER — Ambulatory Visit (HOSPITAL_COMMUNITY)
Admission: RE | Admit: 2015-03-10 | Discharge: 2015-03-10 | Disposition: A | Discharge status: Home / Self Care | Payer: Medicare Other | Source: Ambulatory Visit | Attending: Cardiology | Admitting: Cardiology

## 2015-03-10 VITALS — BP 128/82 | HR 88 | Wt 191.8 lb

## 2015-03-10 DIAGNOSIS — I4891 Unspecified atrial fibrillation: Secondary | ICD-10-CM | POA: Diagnosis not present

## 2015-03-10 DIAGNOSIS — N183 Chronic kidney disease, stage 3 unspecified: Secondary | ICD-10-CM | POA: Diagnosis present

## 2015-03-10 DIAGNOSIS — N189 Chronic kidney disease, unspecified: Secondary | ICD-10-CM | POA: Insufficient documentation

## 2015-03-10 DIAGNOSIS — I5022 Chronic systolic (congestive) heart failure: Secondary | ICD-10-CM | POA: Diagnosis not present

## 2015-03-10 DIAGNOSIS — I4892 Unspecified atrial flutter: Secondary | ICD-10-CM

## 2015-03-10 DIAGNOSIS — R21 Rash and other nonspecific skin eruption: Secondary | ICD-10-CM | POA: Insufficient documentation

## 2015-03-10 DIAGNOSIS — I5021 Acute systolic (congestive) heart failure: Secondary | ICD-10-CM

## 2015-03-10 DIAGNOSIS — I5041 Acute combined systolic (congestive) and diastolic (congestive) heart failure: Secondary | ICD-10-CM

## 2015-03-10 LAB — BASIC METABOLIC PANEL
Anion gap: 17 — ABNORMAL HIGH (ref 5–15)
BUN: 47 mg/dL — AB (ref 6–23)
CALCIUM: 9.3 mg/dL (ref 8.4–10.5)
CO2: 28 mmol/L (ref 19–32)
Chloride: 88 mmol/L — ABNORMAL LOW (ref 96–112)
Creatinine, Ser: 2.39 mg/dL — ABNORMAL HIGH (ref 0.50–1.35)
GFR calc Af Amer: 31 mL/min — ABNORMAL LOW (ref >90)
GFR, EST NON AFRICAN AMERICAN: 27 mL/min — AB (ref >90)
Glucose, Bld: 104 mg/dL — ABNORMAL HIGH (ref 70–99)
Potassium: 3.9 mmol/L (ref 3.5–5.1)
SODIUM: 133 mmol/L — AB (ref 135–145)

## 2015-03-10 MED ORDER — CARVEDILOL 6.25 MG PO TABS: 6.2500 mg | ORAL_TABLET | Freq: Two times a day (BID) | ORAL | Status: DC

## 2015-03-10 MED ORDER — RIVAROXABAN 15 MG PO TABS
15.0000 mg | ORAL_TABLET | Freq: Two times a day (BID) | ORAL | Status: DC
Start: 2015-03-10 — End: 2015-03-17

## 2015-03-10 MED ORDER — POTASSIUM CHLORIDE CRYS ER 20 MEQ PO TBCR: 20.0000 meq | EXTENDED_RELEASE_TABLET | Freq: Every day | ORAL | Status: DC

## 2015-03-10 MED ORDER — FUROSEMIDE 40 MG PO TABS: 40.0000 mg | ORAL_TABLET | Freq: Every day | ORAL | Status: DC

## 2015-03-10 NOTE — Patient Instructions (Signed)
STOP Taking Ranexa, Amiodarone, Torsemide, and Eliquis.  START Lasix 1 tablet daily.  START Potassium 1 tablet daily.  START Xarelto 1 tablet daily.  START Coreg 1 tablet twice a day.  START Benadryl as needed in the evening.  Labs today.  Follow up 1 week.

## 2015-03-10 NOTE — Progress Notes (Addendum)
Patient ID: Martin [name], male   DOB: 1946-02-27, 69 y.o.   MRN: [id number] 69 yo with history of prior cardiomyopathy presents for followup after admission for atypical atrial flutter with RVR and acute systolic CHF.  Patient was admitted in 4/13 with cardiogenic shock in the setting of atrial fibrillation, EF 30-35% by echo at that time.  With medical treatment, he improved, and later in 4/13, EF was up to normal range.  He was started on coumadin for atrial fibrillation but developed a rectus sheath hematoma (large) and anticoagulation was stopped. He was then lost to cardiology followup for several years.    Patient was admitted to [facility] 03/01/15 with dyspnea and orthopnea.  He was not taking Lasix.  He was noted to be in atypical flutter with RVR and in CHF.  Initially required norepinephrine, milrinone, and high dose Lasix boluses + metolazone.  He diuresed well eventually but creatinine rose up to 3.4.  Echo showed EF 20-25% with RV systolic dysfunction.  Amiodarone gtt and Eliquis was started.  He had DCCV back to NSR.  Ranolazine was added to try to maintain NSR.    Since discharge, patient has been doing well from a cardiac standpoint. He is in NSR today.  No dyspnea walking on flat ground.  No orthopnea/PND.  No chest pain.  No palpitations or lightheadedness.  However, he has developed a diffuse erythematous rash across his trunk and on his arms.  Rash is pruritic.  Suspicion for drug eruption, all meds were stopped yesterday morning.   Labs (3/16): K 3.4, creatinine 2.34  ECG: NSR, nonspecific T wave changes, U waves  PMH: 1. Cardiomyopathy: Admitted in 4/13 with cardiogenic shock in setting of atrial fibrillation.  EF 30-35% by 4/13 echo.  This improved to 55-60% later that same month.  Admitted 3/16 with atypical atrial flutter and CHF.  Echo (3/16) with EF 20-25%, restrictive diastolic function, RV systolic function severely depressed, moderate MR.  Possible tachycardia-mediated  cardiomyopathy (rapid atrial flutter).  2. Atrial arrhythmias: h/o atrial fibrillation in 4/13, off anticoagulation with severe rectus sheath hematoma (spontaneous) in 4/13.  Developed atypical atrial flutter in 3/16 and underwent TEE-guided DCCV back to NSR.  Started on amiodarone and Eliquis.   3. Rectus sheath hematoma: Spontaneous during 4/13 admission in setting of taking coumadin.  Abdominal US 3/16 with chronic hematoma in abdominal wall.  4. CKD 5. Type II diabetes  SH: Married, nonsmoker, occasional ETOH, lives in [place].   FH; No premature CAD.  No cardiomyopathy.   ROS: All systems reviewed and negative except as per HPI.   Current Outpatient Prescriptions  Medication Sig Dispense Refill  . carvedilol (COREG) 6.25 MG tablet Take 1 tablet (6.25 mg total) by mouth 2 (two) times daily with a meal. 60 tablet 6  . furosemide (LASIX) 40 MG tablet Take 1 tablet (40 mg total) by mouth daily. 30 tablet 6  . potassium chloride SA (K-DUR,KLOR-CON) 20 MEQ tablet Take 1 tablet (20 mEq total) by mouth daily. 60 tablet 5  . Rivaroxaban (XARELTO) 15 MG TABS tablet Take 1 tablet (15 mg total) by mouth 2 (two) times daily with a meal. 30 tablet 6   No current facility-administered medications for this encounter.   BP 128/82 mmHg  Pulse 88  Wt 191 lb 12.8 oz (87 kg)  SpO2 96% General: NAD Neck: No JVD, no thyromegaly or thyroid nodule.  Lungs: Clear to auscultation bilaterally with normal respiratory effort. CV: Nondisplaced PMI.  Heart regular S1/S2,  no S3/S4, no murmur.  Trace ankle edema.  No carotid bruit.  Normal pedal pulses.  Abdomen: Soft, nontender, no hepatosplenomegaly, no distention.  Skin: Diffuse and marked erythematous patch-like rash on trunk, arms.   Neurologic: Alert and oriented x 3.  Psych: Normal affect. Extremities: No clubbing or cyanosis.  HEENT: Normal.   Assessment/Plan: 1. Chronic systolic CHF: Suspect nonischemic cardiomyopathy.  Improved in the past with  termination of atrial fibrillation.  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  This admission, he was in atrial flutter with RVR, not sure how long.  He maintains NSR today.   - See below, will stop amiodarone.  Therefore, will add Coreg 6.25 mg bid.  He has tolerated Coreg in the past.  - Stop torsemide given suspected drug eruption.  I will instead start Lasix 40 mg daily and KCl 20 mEq daily.  - No ACEI or spironolactone with elevated creatinine, can consider Bidil initiation soon.  - BMET today and in 1 week.  2. Rash: Erythematous patch-like rash on trunk with pruritis.  Concern for drug eruption.   - Unfortunately, he is on 4 new drugs.  For now, will have him stop Ranexa, amiodarone, torsemide, and apixaban.  - May use Benadryl for control of itching.  - He will use Coreg and Lasix as above.   - Replace apixaban with Xarelto.   -Think we need to keep him NSR given concern for tachy-mediated CMP.  I will likely re-challenge him with amiodarone in the future.  3. Atrial flutter/atrial fibrillation: He remains in NSR today.  Stopping Eliquis with suspected drug reaction.   - Replace Eliquis with Xarelto 15 mg daily.  Had remote rectus sheath hematoma.  Will need to follow closely for any bleeding.  - Stopping amiodarone as above.  Would like to re-challenge eventually.  4. CKD: Creatinine coming down slowly.  - Add Coreg as above.   Martin [name] 03/10/2015

## 2015-03-11 NOTE — Addendum Note (Signed)
Encounter addended by: Deitra Mayo, CCT on: 03/11/2015  7:58 AM<BR>     Documentation filed: Charges VN

## 2015-03-17 ENCOUNTER — Ambulatory Visit (HOSPITAL_COMMUNITY)
Admission: RE | Admit: 2015-03-17 | Discharge: 2015-03-17 | Disposition: A | Discharge status: Home / Self Care | Payer: Medicare Other | Source: Ambulatory Visit | Attending: Cardiology | Admitting: Cardiology

## 2015-03-17 VITALS — BP 102/80 | HR 117 | Wt 195.8 lb

## 2015-03-17 DIAGNOSIS — N183 Chronic kidney disease, stage 3 unspecified: Secondary | ICD-10-CM

## 2015-03-17 DIAGNOSIS — Z79899 Other long term (current) drug therapy: Secondary | ICD-10-CM | POA: Diagnosis not present

## 2015-03-17 DIAGNOSIS — R21 Rash and other nonspecific skin eruption: Secondary | ICD-10-CM | POA: Diagnosis not present

## 2015-03-17 DIAGNOSIS — I4892 Unspecified atrial flutter: Secondary | ICD-10-CM | POA: Insufficient documentation

## 2015-03-17 DIAGNOSIS — L299 Pruritus, unspecified: Secondary | ICD-10-CM | POA: Diagnosis not present

## 2015-03-17 DIAGNOSIS — I429 Cardiomyopathy, unspecified: Secondary | ICD-10-CM | POA: Insufficient documentation

## 2015-03-17 DIAGNOSIS — E119 Type 2 diabetes mellitus without complications: Secondary | ICD-10-CM | POA: Insufficient documentation

## 2015-03-17 DIAGNOSIS — N189 Chronic kidney disease, unspecified: Secondary | ICD-10-CM | POA: Insufficient documentation

## 2015-03-17 DIAGNOSIS — I5022 Chronic systolic (congestive) heart failure: Secondary | ICD-10-CM | POA: Diagnosis not present

## 2015-03-17 DIAGNOSIS — I4891 Unspecified atrial fibrillation: Secondary | ICD-10-CM | POA: Insufficient documentation

## 2015-03-17 MED ORDER — APIXABAN 5 MG PO TABS: 5.0000 mg | ORAL_TABLET | Freq: Two times a day (BID) | ORAL | Status: DC

## 2015-03-17 MED ORDER — WARFARIN SODIUM 5 MG PO TABS: 5.0000 mg | ORAL_TABLET | Freq: Every day | ORAL | Status: DC

## 2015-03-17 MED ORDER — AMIODARONE HCL 200 MG PO TABS: 400.0000 mg | ORAL_TABLET | Freq: Two times a day (BID) | ORAL | Status: DC

## 2015-03-17 MED ORDER — POTASSIUM CHLORIDE CRYS ER 20 MEQ PO TBCR: 20.0000 meq | EXTENDED_RELEASE_TABLET | Freq: Two times a day (BID) | ORAL | Status: DC

## 2015-03-17 MED ORDER — FUROSEMIDE 40 MG PO TABS: ORAL_TABLET | ORAL | Status: DC

## 2015-03-17 MED ORDER — AMIODARONE HCL 200 MG PO TABS: 200.0000 mg | ORAL_TABLET | Freq: Two times a day (BID) | ORAL | Status: DC

## 2015-03-17 NOTE — Patient Instructions (Addendum)
Start Amiodarone 400 mg Twice daily for 1 week, THEN decrease to 200 mg Twice daily, if rash returns call us  Start Eliquis 5 mg Twice daily   Increase Furosemide to 40 mg in AM and 20 mg (1/2 tab) in PM  Increase Potassium to 20 meq Twice daily   Your physician has recommended that you have a sleep study. This test records several body functions during sleep, including: brain activity, eye movement, oxygen and carbon dioxide blood levels, heart rate and rhythm, breathing rate and rhythm, the flow of air through your mouth and nose, snoring, body muscle movements, and chest and belly movement.  Your physician recommends that you schedule a follow-up appointment in: 2 weeks with bmet

## 2015-03-18 NOTE — Progress Notes (Signed)
Patient ID: Martin [name], male   DOB: 08-29-1946, 69 y.o.   MRN: [id number] 69 yo with history of prior cardiomyopathy presents for followup after admission for atypical atrial flutter with RVR and acute systolic CHF.  Patient was admitted in 4/13 with cardiogenic shock in the setting of atrial fibrillation, EF 30-35% by echo at that time.  With medical treatment, he improved, and later in 4/13, EF was up to normal range.  He was started on coumadin for atrial fibrillation but developed a rectus sheath hematoma (large) and anticoagulation was stopped. He was then lost to cardiology followup for several years.    Patient was admitted to [facility] 03/01/15 with dyspnea and orthopnea.  He was not taking Lasix.  He was noted to be in atypical flutter with RVR and in CHF.  Initially required norepinephrine, milrinone, and high dose Lasix boluses + metolazone.  He diuresed well eventually but creatinine rose up to 3.4.  Echo showed EF 20-25% with RV systolic dysfunction.  Amiodarone gtt and Eliquis was started.  He had DCCV back to NSR.  Ranolazine was added to try to maintain NSR.    At last appointment, patient had developed a diffuse pruritic erythematous rash likely due to his meds though we were not sure which.  He stopped ranolazine, Eliquis, amiodarone, and torsemide.  The rash has improved.  I put him on Xarelto, Lasix 40 daily, and Coreg 6.25 mg bid at last appointment. He had GI upset so stopped the Xarelto.  Today, he is back in atrial fibrillation with RVR in the 120s.  Weight is up 4 lbs.  Still, he reports no dyspnea walking on flat ground.  No orthopnea/PND.  No chest pain.  He feels his heart going fast.  Finally, he tends to be very sleepy during the day.   Labs (3/16): K 3.4, creatinine 2.34  ECG: Atrial fibrillation rate 131  PMH: 1. Cardiomyopathy: Admitted in 4/13 with cardiogenic shock in setting of atrial fibrillation.  EF 30-35% by 4/13 echo.  This improved to 55-60% later that same  month.  Admitted 3/16 with atypical atrial flutter and CHF.  Echo (3/16) with EF 20-25%, restrictive diastolic function, RV systolic function severely depressed, moderate MR.  Possible tachycardia-mediated cardiomyopathy (rapid atrial flutter).  2. Atrial arrhythmias: h/o atrial fibrillation in 4/13, off anticoagulation with severe rectus sheath hematoma (spontaneous) in 4/13.  Developed atypical atrial flutter in 3/16 and underwent TEE-guided DCCV back to NSR.  Started on amiodarone and Eliquis.  Amiodarone held with suspected drug rash, patient went back into atrial fibrillation in 3/16.  3. Rectus sheath hematoma: Spontaneous during 4/13 admission in setting of taking coumadin.  Abdominal US 3/16 with chronic hematoma in abdominal wall.  4. CKD 5. Type II diabetes  SH: Married, nonsmoker, occasional ETOH, lives in [place].   FH; No premature CAD.  No cardiomyopathy.   ROS: All systems reviewed and negative except as per HPI.   Current Outpatient Prescriptions  Medication Sig Dispense Refill  . carvedilol (COREG) 6.25 MG tablet Take 1 tablet (6.25 mg total) by mouth 2 (two) times daily with a meal. 60 tablet 6  . furosemide (LASIX) 40 MG tablet Take 1 tab in AM and 1/2 tab in PM 30 tablet 6  . potassium chloride SA (K-DUR,KLOR-CON) 20 MEQ tablet Take 1 tablet (20 mEq total) by mouth 2 (two) times daily. 60 tablet 5  . amiodarone (PACERONE) 200 MG tablet Take 2 tablets (400 mg total) by mouth 2 (two) times  daily. 90 tablet 3  . [START ON 03/25/2015] amiodarone (PACERONE) 200 MG tablet Take 1 tablet (200 mg total) by mouth 2 (two) times daily. 90 tablet 3  . apixaban (ELIQUIS) 5 MG TABS tablet Take 1 tablet (5 mg total) by mouth 2 (two) times daily. 60 tablet    No current facility-administered medications for this encounter.   BP 102/80 mmHg  Pulse 117  Wt 195 lb 12.8 oz (88.814 kg)  SpO2 99% General: NAD Neck: JVP 8-9 cm, no thyromegaly or thyroid nodule.  Lungs: Clear to  auscultation bilaterally with normal respiratory effort. CV: Nondisplaced PMI.  Heart mildly tachy, irregular S1/S2, no S3/S4, no murmur.  1+ ankle edema.  No carotid bruit.  Normal pedal pulses.  Abdomen: Soft, nontender, no hepatosplenomegaly, no distention.  Skin: Rash has improved considerably.   Neurologic: Alert and oriented x 3.  Psych: Normal affect. Extremities: No clubbing or cyanosis.  HEENT: Normal.   Assessment/Plan: 1. Chronic systolic CHF: Suspect nonischemic cardiomyopathy.  Improved in the past with termination of atrial fibrillation.  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  Last admission, he was in atrial flutter with RVR, not sure how long.  At last appointment, amiodarone was stopped due to drug rash with uncertain cause.  Today, he is back in atrial fibrillation with RVR and does appear to have some volume overload.  - Continue Coreg 6.25 mg bid.  - Increase Lasix to 40 qam, 20 qpm and increase KCl to 20 bid.  - No ACEI or spironolactone with elevated creatinine, can consider Bidil initiation soon.  - BMET at followup in 2 wks.  2. Rash: Erythematous patch-like rash on trunk with pruritis.  Concern for drug eruption.  It is now resolving.   3. Atrial flutter/atrial fibrillation: Back in atrial fibrillation today since stopping amiodarone.  . - He did not tolerate Xarelto and does not want to go back on coumadin given rectus sheath hematoma while on coumadin.   - I will restart Eliquis 5 mg bid and watch for worsening of rash.  - I also think we need to work to get him in NSR given concern for tachy-medated CMP.  I will increase amiodarone to 400 mg bid x 7 week then decrease to 200 mg bid after that.  He will return in 2 wks and if still in atrial fibrillation, will need to arrange DCCV (with or without TEE depending on how he is doing).  4. CKD: Stable elevation.  5. Suspect OSA: I will arrange for sleep study. Treatment of OSA may help atrial fibrillation control.     [name] 03/18/2015

## 2015-04-01 ENCOUNTER — Ambulatory Visit (HOSPITAL_COMMUNITY)
Admission: RE | Admit: 2015-04-01 | Discharge: 2015-04-01 | Disposition: A | Discharge status: Home / Self Care | Payer: Medicare Other | Source: Ambulatory Visit | Attending: Cardiology | Admitting: Cardiology

## 2015-04-01 VITALS — BP 130/78 | HR 75 | Wt 192.2 lb

## 2015-04-01 DIAGNOSIS — I480 Paroxysmal atrial fibrillation: Secondary | ICD-10-CM | POA: Diagnosis not present

## 2015-04-01 DIAGNOSIS — I4891 Unspecified atrial fibrillation: Secondary | ICD-10-CM | POA: Diagnosis not present

## 2015-04-01 DIAGNOSIS — N183 Chronic kidney disease, stage 3 unspecified: Secondary | ICD-10-CM

## 2015-04-01 DIAGNOSIS — I5022 Chronic systolic (congestive) heart failure: Secondary | ICD-10-CM | POA: Diagnosis not present

## 2015-04-01 LAB — BASIC METABOLIC PANEL
Anion gap: 9 (ref 5–15)
BUN: 15 mg/dL (ref 6–23)
CALCIUM: 9.1 mg/dL (ref 8.4–10.5)
CO2: 29 mmol/L (ref 19–32)
CREATININE: 1.47 mg/dL — AB (ref 0.50–1.35)
Chloride: 102 mmol/L (ref 96–112)
GFR calc non Af Amer: 48 mL/min — ABNORMAL LOW (ref >90)
GFR, EST AFRICAN AMERICAN: 56 mL/min — AB (ref >90)
Glucose, Bld: 108 mg/dL — ABNORMAL HIGH (ref 70–99)
Potassium: 3.9 mmol/L (ref 3.5–5.1)
Sodium: 140 mmol/L (ref 135–145)

## 2015-04-01 MED ORDER — AMIODARONE HCL 200 MG PO TABS: 200.0000 mg | ORAL_TABLET | Freq: Every day | ORAL | Status: DC

## 2015-04-01 MED ORDER — ISOSORB DINITRATE-HYDRALAZINE 20-37.5 MG PO TABS: 1.0000 | ORAL_TABLET | Freq: Three times a day (TID) | ORAL | Status: DC

## 2015-04-01 NOTE — Progress Notes (Signed)
Patient ID: Martin [name], male   DOB: 12-May-1946, 69 y.o.   MRN: [id number]  69 yo with history of prior cardiomyopathy presents for followup after admission for atypical atrial flutter with RVR and acute systolic CHF.  Patient was admitted in 4/13 with cardiogenic shock in the setting of atrial fibrillation, EF 30-35% by echo at that time.  With medical treatment, he improved, and later in 4/13, EF was up to normal range.  He was started on coumadin for atrial fibrillation but developed a rectus sheath hematoma (large) and anticoagulation was stopped. He was then lost to cardiology followup for several years.    Patient was admitted to [facility] 03/01/15 with dyspnea and orthopnea.  He was not taking Lasix.  He was noted to be in atypical flutter with RVR and in CHF.  Initially required norepinephrine, milrinone, and high dose Lasix boluses + metolazone.  He diuresed well eventually but creatinine rose up to 3.4.  Echo showed EF 20-25% with RV systolic dysfunction.  Amiodarone gtt and Eliquis was started.  He had DCCV back to NSR.  Ranolazine was added to try to maintain NSR.    He returns for follow up. Last visit he was in A fib RVR with plans for DC-CV. Overall feeling ok. Denies SOB/PND/Orthopnea. Taking all medications except was only taking amio 200 mg daily and not taking eliquis. Says rash has improved. Was supposed to had sleep study but he canceled.   Labs (3/16): K 3.4, creatinine 2.34  PMH: 1. Cardiomyopathy: Admitted in 4/13 with cardiogenic shock in setting of atrial fibrillation.  EF 30-35% by 4/13 echo.  This improved to 55-60% later that same month.  Admitted 3/16 with atypical atrial flutter and CHF.  Echo (3/16) with EF 20-25%, restrictive diastolic function, RV systolic function severely depressed, moderate MR.  Possible tachycardia-mediated cardiomyopathy (rapid atrial flutter).  2. Atrial arrhythmias: h/o atrial fibrillation in 4/13, off anticoagulation with severe rectus sheath  hematoma (spontaneous) in 4/13.  Developed atypical atrial flutter in 3/16 and underwent TEE-guided DCCV back to NSR.  Started on amiodarone and Eliquis.  Amiodarone held with suspected drug rash, patient went back into atrial fibrillation in 3/16.  3. Rectus sheath hematoma: Spontaneous during 4/13 admission in setting of taking coumadin.  Abdominal US 3/16 with chronic hematoma in abdominal wall.  4. CKD 5. Type II diabetes  SH: Married, nonsmoker, occasional ETOH, lives in [place].   FH; No premature CAD.  No cardiomyopathy.   ROS: All systems reviewed and negative except as per HPI.   Current Outpatient Prescriptions  Medication Sig Dispense Refill  . amiodarone (PACERONE) 200 MG tablet Take 1 tablet (200 mg total) by mouth 2 (two) times daily. (Patient taking differently: Take 200 mg by mouth daily. ) 90 tablet 3  . carvedilol (COREG) 6.25 MG tablet Take 1 tablet (6.25 mg total) by mouth 2 (two) times daily with a meal. 60 tablet 6  . furosemide (LASIX) 40 MG tablet Take 1 tab in AM and 1/2 tab in PM 30 tablet 6  . potassium chloride SA (K-DUR,KLOR-CON) 20 MEQ tablet Take 1 tablet (20 mEq total) by mouth 2 (two) times daily. 60 tablet 5  . apixaban (ELIQUIS) 5 MG TABS tablet Take 1 tablet (5 mg total) by mouth 2 (two) times daily. (Patient not taking: Reported on 04/01/2015) 60 tablet    No current facility-administered medications for this encounter.   BP 130/78 mmHg  Pulse 75  Wt 192 lb 4 oz (87.204 kg)  SpO2 99% General: NAD Neck: JVP 5-6  cm, no thyromegaly or thyroid nodule.  Lungs: Clear to auscultation bilaterally with normal respiratory effort. CV: Nondisplaced PMI.  Regular  S1/S2, no S3/S4, no murmur.  ankle edema.  No carotid bruit.  Normal pedal pulses.  Abdomen: Soft, nontender, no hepatosplenomegaly, no distention.  Skin: Rash has improved considerably.   Neurologic: Alert and oriented x 3.  Psych: Normal affect. Extremities: No clubbing or cyanosis.  HEENT:  Normal.   EKG : NSR 70 bpm  Assessment/Plan: 1. Chronic systolic CHF: Suspect nonischemic cardiomyopathy. ECHO 02/3015 EF 20-25%/  Improved in the past with termination of atrial fibrillation.  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  Last admission, he was in atrial flutter with RVR, not sure how long.  Last visit he was still in Afib and he was set up for TEE DCCV but today he is  back in a regular rhythm.  Volume status stable. Continue current diuretic regimen.   - Continue Coreg 6.25 mg bid.  - No ACEI or spironolactone with elevated creatinine - Start Bidil 20-37.5 mg tid.   _ Will repeat ECHO after HF meds optimized.  BMET ok today. K 3.9 Creatinine 1.47  2. Rash: Erythematous patch-like rash on trunk with pruritis.  Resolved.   3. Atrial flutter/atrial fibrillation: Back in NSR on amio 200 mg dialy. Not on coumadin or eliquis  because he refuses. He understands he is at risk for stroke.  4. CKD Stage III: Stable elevation.  5. Suspect OSA:  Was set up for sleep study but he cancelled and because he decided he  does not want to pursue.  Follow up in 3 weeks. Will need EKG.    CLEGG,AMY NP-C  04/01/2015

## 2015-04-01 NOTE — Patient Instructions (Signed)
Labs today  DECREASE Amiodarone 200 mg daily STOP Eliquis START Bidil, one tab three times per day  Your physician recommends that you schedule a follow-up appointment in: 3 weeks  Do the following things EVERYDAY: 1) Weigh yourself in the morning before breakfast. Write it down and keep it in a log. 2) Take your medicines as prescribed 3) Eat low salt foods-Limit salt (sodium) to 2000 mg per day.  4) Stay as active as you can everyday 5) Limit all fluids for the day to less than 2 liters 6)

## 2015-04-02 DIAGNOSIS — N183 Chronic kidney disease, stage 3 (moderate): Secondary | ICD-10-CM

## 2015-04-02 DIAGNOSIS — N179 Acute kidney failure, unspecified: Secondary | ICD-10-CM | POA: Insufficient documentation

## 2015-04-23 ENCOUNTER — Ambulatory Visit (HOSPITAL_COMMUNITY)
Admission: RE | Admit: 2015-04-23 | Discharge: 2015-04-23 | Disposition: A | Discharge status: Home / Self Care | Payer: Medicare Other | Source: Ambulatory Visit | Attending: Internal Medicine | Admitting: Internal Medicine

## 2015-04-23 ENCOUNTER — Encounter (HOSPITAL_COMMUNITY): Payer: Self-pay

## 2015-04-23 VITALS — BP 116/60 | HR 67 | Wt 198.8 lb

## 2015-04-23 DIAGNOSIS — I5022 Chronic systolic (congestive) heart failure: Secondary | ICD-10-CM | POA: Diagnosis not present

## 2015-04-23 DIAGNOSIS — E119 Type 2 diabetes mellitus without complications: Secondary | ICD-10-CM | POA: Insufficient documentation

## 2015-04-23 DIAGNOSIS — I493 Ventricular premature depolarization: Secondary | ICD-10-CM | POA: Diagnosis not present

## 2015-04-23 DIAGNOSIS — Z79899 Other long term (current) drug therapy: Secondary | ICD-10-CM | POA: Insufficient documentation

## 2015-04-23 DIAGNOSIS — N183 Chronic kidney disease, stage 3 unspecified: Secondary | ICD-10-CM

## 2015-04-23 DIAGNOSIS — I4891 Unspecified atrial fibrillation: Secondary | ICD-10-CM | POA: Insufficient documentation

## 2015-04-23 DIAGNOSIS — I4892 Unspecified atrial flutter: Secondary | ICD-10-CM | POA: Insufficient documentation

## 2015-04-23 DIAGNOSIS — I480 Paroxysmal atrial fibrillation: Secondary | ICD-10-CM

## 2015-04-23 MED ORDER — ISOSORB DINITRATE-HYDRALAZINE 20-37.5 MG PO TABS: 2.0000 | ORAL_TABLET | Freq: Three times a day (TID) | ORAL | Status: DC

## 2015-04-23 NOTE — Progress Notes (Signed)
Patient ID: Martin [name], male   DOB: 1946/05/08, 69 y.o.   MRN: [id number]  69 yo with history of prior cardiomyopathy presents for followup after admission for atypical atrial flutter with RVR and acute systolic CHF.  Patient was admitted in 4/13 with cardiogenic shock in the setting of atrial fibrillation, EF 30-35% by echo at that time.  With medical treatment, he improved, and later in 4/13, EF was up to normal range.  He was started on coumadin for atrial fibrillation but developed a rectus sheath hematoma (large) and anticoagulation was stopped. He was then lost to cardiology followup for several years.    Patient was admitted to [facility] 03/01/15 with dyspnea and orthopnea.  He was not taking Lasix.  He was noted to be in atypical flutter with RVR and in CHF.  Initially required norepinephrine, milrinone, and high dose Lasix boluses + metolazone.  He diuresed well eventually but creatinine rose up to 3.4.  Echo showed EF 20-25% with RV systolic dysfunction.  Amiodarone gtt and Eliquis was started.  He had DCCV back to NSR.  Ranolazine was added to try to maintain NSR.    He returns for follow up. Last visit he was started on bidil and has tolerated without difficulty. Doing well. Taking all medications. Denies SOB/PND/Orthopnea. Able to walk 2-3 miles. Weight at home 192-198 pounds. Increased appetite. Refuses anticoagulation. Was supposed to had sleep study but he canceled.   Labs (3/16): K 3.4, creatinine 2.34 Labs (04/01/2015) K 3.9 Creatinine 1.47   PMH: 1. Cardiomyopathy: Admitted in 4/13 with cardiogenic shock in setting of atrial fibrillation.  EF 30-35% by 4/13 echo.  This improved to 55-60% later that same month.  Admitted 3/16 with atypical atrial flutter and CHF.  Echo (3/16) with EF 20-25%, restrictive diastolic function, RV systolic function severely depressed, moderate MR.  Possible tachycardia-mediated cardiomyopathy (rapid atrial flutter).  2. Atrial arrhythmias: h/o atrial  fibrillation in 4/13, off anticoagulation with severe rectus sheath hematoma (spontaneous) in 4/13.  Developed atypical atrial flutter in 3/16 and underwent TEE-guided DCCV back to NSR.  Started on amiodarone and Eliquis.  Amiodarone held with suspected drug rash, patient went back into atrial fibrillation in 3/16.  3. Rectus sheath hematoma: Spontaneous during 4/13 admission in setting of taking coumadin.  Abdominal US 3/16 with chronic hematoma in abdominal wall.  4. CKD 5. Type II diabetes  SH: Married, nonsmoker, occasional ETOH, lives in [place].   FH; No premature CAD.  No cardiomyopathy.   ROS: All systems reviewed and negative except as per HPI.   Current Outpatient Prescriptions  Medication Sig Dispense Refill  . amiodarone (PACERONE) 200 MG tablet Take 1 tablet (200 mg total) by mouth daily. 90 tablet 3  . carvedilol (COREG) 6.25 MG tablet Take 1 tablet (6.25 mg total) by mouth 2 (two) times daily with a meal. 60 tablet 6  . furosemide (LASIX) 40 MG tablet Take 1 tab in AM and 1/2 tab in PM 30 tablet 6  . isosorbide-hydrALAZINE (BIDIL) 20-37.5 MG per tablet Take 1 tablet by mouth 3 (three) times daily. 90 tablet 3  . potassium chloride SA (K-DUR,KLOR-CON) 20 MEQ tablet Take 1 tablet (20 mEq total) by mouth 2 (two) times daily. 60 tablet 5   No current facility-administered medications for this encounter.   BP 116/60 mmHg  Pulse 67  Wt 198 lb 12.8 oz (90.175 kg)  SpO2 99% General: NAD Wife present.  Neck: JVP 5-6  cm, no thyromegaly or thyroid nodule.  Lungs: Clear to auscultation bilaterally with normal respiratory effort. CV: Nondisplaced PMI.  Regular  S1/S2, no S3/S4, no murmur.  ankle edema.  No carotid bruit.  Normal pedal pulses.  Abdomen: Soft, nontender, no hepatosplenomegaly, no distention.  Skin: Rash has improved considerably.   Neurologic: Alert and oriented x 3.  Psych: Normal affect. Extremities: No clubbing or cyanosis.  HEENT: Normal.   EKG : NSR 67  bpm PVCs.   Assessment/Plan: 1. Chronic systolic CHF: Suspect nonischemic cardiomyopathy. ECHO 02/3015 EF 20-25%/  Improved in the past with termination of atrial fibrillation.  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  Last admission, he was in atrial flutter with RVR, not sure how long.  Has remained in SR.   NYHA II. Volume status stable. Continue current diuretic regimen.   - Continue Coreg 6.25 mg bid.  - No ACEI or spironolactone with elevated creatinine. -Increase  Bidil 40-75 mg tid.    Will repeat ECHO next visit.   2. Rash: Erythematous patch-like rash on trunk with pruritis.  Resolved.   3. Atrial flutter/atrial fibrillation: Chemically converted on amio 200 mg daily. He remains in NSR. Not on coumadin or eliquis  because he refuses because he had rectus sheath bleed. He understands he is at risk for stroke. We discussed anticoagulation again today.  4. CKD Stage III: Stable elevation.  5. Suspect OSA:  Was set up for sleep study but he cancelled and because he decided he  does not want to pursue.  Follow up in 6 weeks with an ECHO.     Talyia Allende NP-C  04/23/2015

## 2015-04-23 NOTE — Patient Instructions (Signed)
INCREASE Bidil to 2 tablets three times daily.  Follow up 6 weeks with echocardiogram.  Do the following things EVERYDAY: 1) Weigh yourself in the morning before breakfast. Write it down and keep it in a log. 2) Take your medicines as prescribed 3) Eat low salt foods-Limit salt (sodium) to 2000 mg per day.  4) Stay as active as you can everyday 5) Limit all fluids for the day to less than 2 liters

## 2015-04-24 NOTE — Addendum Note (Signed)
Encounter addended by: Deitra Mayo, CCT on: 04/24/2015  8:28 AM<BR>     Documentation filed: Charges VN

## 2015-06-04 ENCOUNTER — Encounter (HOSPITAL_COMMUNITY): Payer: Self-pay

## 2015-06-04 ENCOUNTER — Ambulatory Visit (HOSPITAL_COMMUNITY)
Admission: RE | Admit: 2015-06-04 | Discharge: 2015-06-04 | Disposition: A | Discharge status: Home / Self Care | Payer: Medicare Other | Source: Ambulatory Visit | Attending: Internal Medicine | Admitting: Internal Medicine

## 2015-06-04 ENCOUNTER — Ambulatory Visit (HOSPITAL_BASED_OUTPATIENT_CLINIC_OR_DEPARTMENT_OTHER)
Admission: RE | Admit: 2015-06-04 | Discharge: 2015-06-04 | Disposition: A | Discharge status: Home / Self Care | Payer: Medicare Other | Source: Ambulatory Visit | Attending: Internal Medicine | Admitting: Internal Medicine

## 2015-06-04 VITALS — BP 110/68 | HR 58 | Wt 197.8 lb

## 2015-06-04 DIAGNOSIS — I509 Heart failure, unspecified: Secondary | ICD-10-CM | POA: Insufficient documentation

## 2015-06-04 DIAGNOSIS — I5021 Acute systolic (congestive) heart failure: Secondary | ICD-10-CM | POA: Diagnosis not present

## 2015-06-04 DIAGNOSIS — I480 Paroxysmal atrial fibrillation: Secondary | ICD-10-CM

## 2015-06-04 DIAGNOSIS — I5022 Chronic systolic (congestive) heart failure: Secondary | ICD-10-CM

## 2015-06-04 LAB — BASIC METABOLIC PANEL
Anion gap: 7 (ref 5–15)
BUN: 23 mg/dL — ABNORMAL HIGH (ref 6–20)
CHLORIDE: 104 mmol/L (ref 101–111)
CO2: 30 mmol/L (ref 22–32)
Calcium: 9 mg/dL (ref 8.9–10.3)
Creatinine, Ser: 1.54 mg/dL — ABNORMAL HIGH (ref 0.61–1.24)
GFR, EST AFRICAN AMERICAN: 53 mL/min — AB (ref >60)
GFR, EST NON AFRICAN AMERICAN: 45 mL/min — AB (ref >60)
Glucose, Bld: 95 mg/dL (ref 65–99)
POTASSIUM: 4.2 mmol/L (ref 3.5–5.1)
Sodium: 141 mmol/L (ref 135–145)

## 2015-06-04 LAB — URIC ACID: URIC ACID, SERUM: 9.5 mg/dL — AB (ref 4.4–7.6)

## 2015-06-04 NOTE — Progress Notes (Addendum)
Patient ID: Martin [name], male   DOB: 1946/08/25, 69 y.o.   MRN: [id number]  69 yo with history of prior cardiomyopathy presents for followup after admission for atypical atrial flutter with RVR and acute systolic CHF.  Patient was admitted in 4/13 with cardiogenic shock in the setting of atrial fibrillation, EF 30-35% by echo at that time.  With medical treatment, he improved, and later in 4/13, EF was up to normal range.  He was started on coumadin for atrial fibrillation but developed a rectus sheath hematoma (large) and anticoagulation was stopped. He was then lost to cardiology followup for several years.    Patient was admitted to [facility] 03/01/15 with dyspnea and orthopnea.  He was not taking Lasix.  He was noted to be in atypical flutter with RVR and in CHF.  Initially required norepinephrine, milrinone, and high dose Lasix boluses + metolazone.  He diuresed well eventually but creatinine rose up to 3.4.  Echo showed EF 20-25% with RV systolic dysfunction.  Amiodarone gtt and Eliquis was started.  He had DCCV back to NSR.  Ranolazine was added to try to maintain NSR.    He returns for follow up. Last visit bidil was increased . Not taking amio due to rash. Denies SOB/PND/Orthopnea. Able to walk 2-3 miles. Weight at home 192-198 pounds. Increased appetite. Refuses anticoagulation. Was supposed to had sleep study but he canceled.    Labs (3/16): K 3.4, creatinine 2.34 Labs (04/01/2015) K 3.9 Creatinine 1.47   PMH: 1. Cardiomyopathy: Admitted in 4/13 with cardiogenic shock in setting of atrial fibrillation.  EF 30-35% by 4/13 echo.  This improved to 55-60% later that same month.  Admitted 3/16 with atypical atrial flutter and CHF.  Echo (3/16) with EF 20-25%, restrictive diastolic function, RV systolic function severely depressed, moderate MR.  Possible tachycardia-mediated cardiomyopathy (rapid atrial flutter).  2. Atrial arrhythmias: h/o atrial fibrillation in 4/13, off anticoagulation with severe  rectus sheath hematoma (spontaneous) in 4/13.  Developed atypical atrial flutter in 3/16 and underwent TEE-guided DCCV back to NSR.  Started on amiodarone and Eliquis.  Amiodarone held with suspected drug rash, patient went back into atrial fibrillation in 3/16.  3. Rectus sheath hematoma: Spontaneous during 4/13 admission in setting of taking coumadin.  Abdominal US 3/16 with chronic hematoma in abdominal wall.  4. CKD 5. Type II diabetes  SH: Married, nonsmoker, occasional ETOH, lives in [place].   FH; No premature CAD.  No cardiomyopathy.   ROS: All systems reviewed and negative except as per HPI.   Current Outpatient Prescriptions  Medication Sig Dispense Refill  . carvedilol (COREG) 6.25 MG tablet Take 1 tablet (6.25 mg total) by mouth 2 (two) times daily with a meal. 60 tablet 6  . furosemide (LASIX) 40 MG tablet Take 1 tab in AM and 1/2 tab in PM 30 tablet 6  . isosorbide-hydrALAZINE (BIDIL) 20-37.5 MG per tablet Take 2 tablets by mouth 3 (three) times daily. 180 tablet 3  . potassium chloride SA (K-DUR,KLOR-CON) 20 MEQ tablet Take 1 tablet (20 mEq total) by mouth 2 (two) times daily. 60 tablet 5   No current facility-administered medications for this encounter.   BP 110/68 mmHg  Pulse 58  Wt 197 lb 12 oz (89.699 kg)  SpO2 98% [name]: NAD Wife present.  Neck: JVP 5-6  cm, no thyromegaly or thyroid nodule.  Lungs: Clear to auscultation bilaterally with normal respiratory effort. CV: Nondisplaced PMI.  Regular  S1/S2, no S3/S4, no murmur.  ankle edema.  No carotid bruit.  Normal pedal pulses.  Abdomen: Soft, nontender, no hepatosplenomegaly, no distention.  Skin: Rash has improved considerably.   Neurologic: Alert and oriented x 3.  Psych: Normal affect. Extremities: No clubbing or cyanosis.  HEENT: Normal.     Assessment/Plan: 1. Chronic systolic CHF: Suspect nonischemic cardiomyopathy. ECHO 02/3015 EF 20-25%  Improved in the past with termination of atrial  fibrillation.  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  Last admission, he was in atrial flutter with RVR, not sure how long.   Today ECHO discussed and reviewed EF ~40-45%.    NYHA II. Volume status stable. Continue current diuretic regimen.   - Continue Coreg 6.25 mg bid. Will not increase with heart rate 59 - No ACEI or spironolactone with elevated creatinine. -Continue  Bidil 40-75 mg tid.    2. Rash: Erythematous patch-like rash on trunk with pruritis.  Resolved.   3. Atrial flutter/atrial fibrillation: Chemically converted on amio 200 mg daily. He remains in NSR. Stopped taking amiodarone due to rash. Not on coumadin or eliquis  because he refuses because he had rectus sheath bleed. He understands he is at risk for stroke. We discussed anticoagulation again today. Refer to EP. L atrial size large. Not candidate for amio due to allergies or tikosyn due elevated creatinine.  4. CKD Stage III: Stable elevation.  5. Suspect OSA:  Was set up for sleep study but he cancelled and because he decided he  does not want to pursue. 6. Bilateral ankle pain - Check Uric Acid today.   Follow up in 2 months.      Martin [name]  06/04/2015  Patient seen and examined with Martin Becket, NP. We discussed all aspects of the encounter. I agree with the assessment and plan as stated above. He is much improved NYHA II. I reviewed echo personally and EF improving. ~40-45%.Volume status looks good. On goal dose Bidil. HR too low to titrate carvedilol. No ACE/ARB due to CKD. Suspect his cardiomyopathy likely due to AF/AFL. Unfortunately not candidate for amio (allergy) or tikosyn (CKD). Is at high risk for recurrence with LA size large on echo and probable OSA. Will refer to EP to see if they have any further suggestions to help [place] reduce chance of recurrent AF//AFL and avoid recurrent severe cardiomyopathy.  Bensimhon, Daniel,MD 10:43 AM

## 2015-06-04 NOTE — Patient Instructions (Signed)
Will refer you to our Electrophysiologist at [facility].  [facility] [address], [address] [place], [place] [address] Phone: [phone]  Follow up 2 months.  Do the following things EVERYDAY: 1) Weigh yourself in the morning before breakfast. Write it down and keep it in a log. 2) Take your medicines as prescribed 3) Eat low salt foods-Limit salt (sodium) to 2000 mg per day.  4) Stay as active as you can everyday 5) Limit all fluids for the day to less than 2 liters

## 2015-06-04 NOTE — Progress Notes (Signed)
  Echocardiogram 2D Echocardiogram has been performed.  [name] R 06/04/2015, 10:08 AM

## 2015-06-10 ENCOUNTER — Institutional Professional Consult (permissible substitution): Payer: Medicare Other | Admitting: Internal Medicine

## 2015-06-26 ENCOUNTER — Other Ambulatory Visit (HOSPITAL_COMMUNITY): Payer: Self-pay

## 2015-06-26 MED ORDER — ALLOPURINOL 100 MG PO TABS: 200.0000 mg | ORAL_TABLET | Freq: Every day | ORAL | Status: DC

## 2015-10-22 ENCOUNTER — Ambulatory Visit: Payer: Self-pay | Admitting: *Deleted

## 2015-10-22 DIAGNOSIS — I4892 Unspecified atrial flutter: Secondary | ICD-10-CM

## 2015-10-22 DIAGNOSIS — I4891 Unspecified atrial fibrillation: Secondary | ICD-10-CM

## 2016-05-17 ENCOUNTER — Inpatient Hospital Stay (HOSPITAL_COMMUNITY)
Admission: EM | Admit: 2016-05-17 | Discharge: 2016-05-27 | DRG: 308 | Disposition: A | Discharge status: Home / Self Care | Payer: Medicare Other | Attending: Internal Medicine | Admitting: Internal Medicine

## 2016-05-17 ENCOUNTER — Encounter (HOSPITAL_COMMUNITY): Payer: Self-pay | Admitting: *Deleted

## 2016-05-17 ENCOUNTER — Emergency Department (HOSPITAL_COMMUNITY): Payer: Medicare Other

## 2016-05-17 DIAGNOSIS — Z452 Encounter for adjustment and management of vascular access device: Secondary | ICD-10-CM | POA: Diagnosis not present

## 2016-05-17 DIAGNOSIS — I130 Hypertensive heart and chronic kidney disease with heart failure and stage 1 through stage 4 chronic kidney disease, or unspecified chronic kidney disease: Secondary | ICD-10-CM | POA: Diagnosis present

## 2016-05-17 DIAGNOSIS — I5033 Acute on chronic diastolic (congestive) heart failure: Secondary | ICD-10-CM | POA: Insufficient documentation

## 2016-05-17 DIAGNOSIS — Z6835 Body mass index (BMI) 35.0-35.9, adult: Secondary | ICD-10-CM | POA: Diagnosis not present

## 2016-05-17 DIAGNOSIS — I481 Persistent atrial fibrillation: Secondary | ICD-10-CM | POA: Diagnosis not present

## 2016-05-17 DIAGNOSIS — I5031 Acute diastolic (congestive) heart failure: Secondary | ICD-10-CM

## 2016-05-17 DIAGNOSIS — N179 Acute kidney failure, unspecified: Secondary | ICD-10-CM | POA: Diagnosis not present

## 2016-05-17 DIAGNOSIS — E1122 Type 2 diabetes mellitus with diabetic chronic kidney disease: Secondary | ICD-10-CM | POA: Diagnosis present

## 2016-05-17 DIAGNOSIS — J948 Other specified pleural conditions: Secondary | ICD-10-CM | POA: Diagnosis not present

## 2016-05-17 DIAGNOSIS — I509 Heart failure, unspecified: Secondary | ICD-10-CM | POA: Diagnosis not present

## 2016-05-17 DIAGNOSIS — I429 Cardiomyopathy, unspecified: Secondary | ICD-10-CM | POA: Diagnosis present

## 2016-05-17 DIAGNOSIS — N183 Chronic kidney disease, stage 3 unspecified: Secondary | ICD-10-CM | POA: Diagnosis present

## 2016-05-17 DIAGNOSIS — D696 Thrombocytopenia, unspecified: Secondary | ICD-10-CM | POA: Diagnosis present

## 2016-05-17 DIAGNOSIS — Z9114 Patient's other noncompliance with medication regimen: Secondary | ICD-10-CM | POA: Diagnosis not present

## 2016-05-17 DIAGNOSIS — E875 Hyperkalemia: Secondary | ICD-10-CM | POA: Diagnosis not present

## 2016-05-17 DIAGNOSIS — Z833 Family history of diabetes mellitus: Secondary | ICD-10-CM

## 2016-05-17 DIAGNOSIS — I4891 Unspecified atrial fibrillation: Secondary | ICD-10-CM

## 2016-05-17 DIAGNOSIS — I10 Essential (primary) hypertension: Secondary | ICD-10-CM | POA: Diagnosis not present

## 2016-05-17 DIAGNOSIS — N189 Chronic kidney disease, unspecified: Secondary | ICD-10-CM

## 2016-05-17 DIAGNOSIS — E871 Hypo-osmolality and hyponatremia: Secondary | ICD-10-CM | POA: Diagnosis present

## 2016-05-17 DIAGNOSIS — J90 Pleural effusion, not elsewhere classified: Secondary | ICD-10-CM | POA: Diagnosis present

## 2016-05-17 DIAGNOSIS — I214 Non-ST elevation (NSTEMI) myocardial infarction: Secondary | ICD-10-CM | POA: Diagnosis present

## 2016-05-17 DIAGNOSIS — R570 Cardiogenic shock: Secondary | ICD-10-CM | POA: Diagnosis not present

## 2016-05-17 DIAGNOSIS — R8290 Unspecified abnormal findings in urine: Secondary | ICD-10-CM | POA: Diagnosis not present

## 2016-05-17 DIAGNOSIS — R0602 Shortness of breath: Secondary | ICD-10-CM | POA: Diagnosis not present

## 2016-05-17 DIAGNOSIS — Z79899 Other long term (current) drug therapy: Secondary | ICD-10-CM

## 2016-05-17 DIAGNOSIS — I5023 Acute on chronic systolic (congestive) heart failure: Secondary | ICD-10-CM | POA: Diagnosis present

## 2016-05-17 DIAGNOSIS — J181 Lobar pneumonia, unspecified organism: Secondary | ICD-10-CM | POA: Diagnosis not present

## 2016-05-17 DIAGNOSIS — I5043 Acute on chronic combined systolic (congestive) and diastolic (congestive) heart failure: Secondary | ICD-10-CM | POA: Diagnosis not present

## 2016-05-17 DIAGNOSIS — I129 Hypertensive chronic kidney disease with stage 1 through stage 4 chronic kidney disease, or unspecified chronic kidney disease: Secondary | ICD-10-CM | POA: Diagnosis not present

## 2016-05-17 LAB — I-STAT TROPONIN, ED: Troponin i, poc: 0.13 ng/mL (ref 0.00–0.08)

## 2016-05-17 LAB — I-STAT CHEM 8, ED
BUN: 40 mg/dL — ABNORMAL HIGH (ref 6–20)
Calcium, Ion: 1.08 mmol/L — ABNORMAL LOW (ref 1.13–1.30)
Chloride: 105 mmol/L (ref 101–111)
Creatinine, Ser: 1.7 mg/dL — ABNORMAL HIGH (ref 0.61–1.24)
Glucose, Bld: 171 mg/dL — ABNORMAL HIGH (ref 65–99)
HCT: 57 % — ABNORMAL HIGH (ref 39.0–52.0)
Hemoglobin: 19.4 g/dL — ABNORMAL HIGH (ref 13.0–17.0)
POTASSIUM: 4.4 mmol/L (ref 3.5–5.1)
Sodium: 142 mmol/L (ref 135–145)
TCO2: 24 mmol/L (ref 0–100)

## 2016-05-17 LAB — CBC WITH DIFFERENTIAL/PLATELET
Basophils Absolute: 0 10*3/uL (ref 0.0–0.1)
Basophils Relative: 0 %
Eosinophils Absolute: 0 10*3/uL (ref 0.0–0.7)
Eosinophils Relative: 1 %
HEMATOCRIT: 50.8 % (ref 39.0–52.0)
HEMOGLOBIN: 16.8 g/dL (ref 13.0–17.0)
Lymphocytes Relative: 17 %
Lymphs Abs: 1.1 10*3/uL (ref 0.7–4.0)
MCH: 30.5 pg (ref 26.0–34.0)
MCHC: 33.1 g/dL (ref 30.0–36.0)
MCV: 92.2 fL (ref 78.0–100.0)
Monocytes Absolute: 0.6 10*3/uL (ref 0.1–1.0)
Monocytes Relative: 9 %
Neutro Abs: 4.6 10*3/uL (ref 1.7–7.7)
Neutrophils Relative %: 73 %
Platelets: 156 10*3/uL (ref 150–400)
RBC: 5.51 MIL/uL (ref 4.22–5.81)
RDW: 15.4 % (ref 11.5–15.5)
WBC: 6.3 10*3/uL (ref 4.0–10.5)

## 2016-05-17 LAB — COMPREHENSIVE METABOLIC PANEL
ALT: 41 U/L (ref 17–63)
AST: 47 U/L — ABNORMAL HIGH (ref 15–41)
Albumin: 3.5 g/dL (ref 3.5–5.0)
Alkaline Phosphatase: 83 U/L (ref 38–126)
Anion gap: 9 (ref 5–15)
BUN: 31 mg/dL — AB (ref 6–20)
CALCIUM: 9.1 mg/dL (ref 8.9–10.3)
CO2: 25 mmol/L (ref 22–32)
Chloride: 106 mmol/L (ref 101–111)
Creatinine, Ser: 1.66 mg/dL — ABNORMAL HIGH (ref 0.61–1.24)
GFR calc Af Amer: 48 mL/min — ABNORMAL LOW (ref >60)
GFR calc non Af Amer: 41 mL/min — ABNORMAL LOW (ref >60)
GLUCOSE: 179 mg/dL — AB (ref 65–99)
Potassium: 3.8 mmol/L (ref 3.5–5.1)
SODIUM: 140 mmol/L (ref 135–145)
Total Bilirubin: 1.1 mg/dL (ref 0.3–1.2)
Total Protein: 6.9 g/dL (ref 6.5–8.1)

## 2016-05-17 LAB — TROPONIN I: TROPONIN I: 0.47 ng/mL — AB (ref <0.031)

## 2016-05-17 LAB — TSH: TSH: 1.677 u[IU]/mL (ref 0.350–4.500)

## 2016-05-17 LAB — BRAIN NATRIURETIC PEPTIDE: B Natriuretic Peptide: 905 pg/mL — ABNORMAL HIGH (ref 0.0–100.0)

## 2016-05-17 MED ORDER — POTASSIUM CHLORIDE CRYS ER 20 MEQ PO TBCR
20.0000 meq | EXTENDED_RELEASE_TABLET | Freq: Two times a day (BID) | ORAL | Status: DC
  Administered 2016-05-17 (×2): 20 meq via ORAL
  Filled 2016-05-17 (×2): qty 1

## 2016-05-17 MED ORDER — DILTIAZEM HCL 100 MG IV SOLR
5.0000 mg/h | INTRAVENOUS | Status: DC
  Administered 2016-05-17: 15 mg/h via INTRAVENOUS
  Administered 2016-05-17: 5 mg/h via INTRAVENOUS
  Filled 2016-05-17: qty 100

## 2016-05-17 MED ORDER — ENOXAPARIN SODIUM 40 MG/0.4ML ~~LOC~~ SOLN: 40.0000 mg | SUBCUTANEOUS | Status: DC

## 2016-05-17 MED ORDER — DEXTROSE 5 % IV SOLN
5.0000 mg/h | INTRAVENOUS | Status: DC
  Administered 2016-05-17: 5 mg/h via INTRAVENOUS
  Filled 2016-05-17: qty 100

## 2016-05-17 MED ORDER — FUROSEMIDE 10 MG/ML IJ SOLN
40.0000 mg | Freq: Two times a day (BID) | INTRAMUSCULAR | Status: DC
  Administered 2016-05-17 – 2016-05-18 (×3): 40 mg via INTRAVENOUS
  Filled 2016-05-17 (×3): qty 4

## 2016-05-17 MED ORDER — SODIUM CHLORIDE 0.9% FLUSH: 3.0000 mL | INTRAVENOUS | Status: DC | PRN

## 2016-05-17 MED ORDER — ACETAMINOPHEN 325 MG PO TABS
650.0000 mg | ORAL_TABLET | ORAL | Status: DC | PRN
  Administered 2016-05-23: 650 mg via ORAL
  Filled 2016-05-17: qty 2

## 2016-05-17 MED ORDER — DILTIAZEM HCL 25 MG/5ML IV SOLN
10.0000 mg | Freq: Once | INTRAVENOUS | Status: AC
  Administered 2016-05-17: 10 mg via INTRAVENOUS
  Filled 2016-05-17: qty 5

## 2016-05-17 MED ORDER — ONDANSETRON HCL 4 MG/2ML IJ SOLN
4.0000 mg | Freq: Four times a day (QID) | INTRAMUSCULAR | Status: DC | PRN
  Administered 2016-05-17: 4 mg via INTRAVENOUS
  Filled 2016-05-17: qty 2

## 2016-05-17 MED ORDER — DILTIAZEM HCL 25 MG/5ML IV SOLN
10.0000 mg | Freq: Once | INTRAVENOUS | Status: AC
  Administered 2016-05-17: 10 mg via INTRAVENOUS

## 2016-05-17 MED ORDER — SODIUM CHLORIDE 0.9 % IV SOLN
250.0000 mL | INTRAVENOUS | Status: DC | PRN
  Administered 2016-05-25: 12:00:00 via INTRAVENOUS

## 2016-05-17 MED ORDER — CARVEDILOL 6.25 MG PO TABS
6.2500 mg | ORAL_TABLET | Freq: Two times a day (BID) | ORAL | Status: DC
  Administered 2016-05-17: 6.25 mg via ORAL
  Filled 2016-05-17 (×2): qty 2

## 2016-05-17 MED ORDER — SODIUM CHLORIDE 0.9% FLUSH
3.0000 mL | Freq: Two times a day (BID) | INTRAVENOUS | Status: DC
  Administered 2016-05-17 – 2016-05-26 (×10): 3 mL via INTRAVENOUS

## 2016-05-17 NOTE — Consult Note (Addendum)
Reason for Consult: Rapid atrial fibrillation Referring Physician: Emergency room/PTH Cardiologist: Dr. Missy Sabins Consulting Cardiologist: Dr.Koneswaran  Martin [name] is an 70 y.o. male.  HPI: This is a 70 year old male patient with chronic systolic heart failure with suspected nonischemic cardiomyopathy. Echo 02/2015 EF was 20-25% which improved in the past with termination of atrial fibrillation. Possible tachycardia mediated cardiomyopathy. EF had improved to 40-45%. Most recent echo in 05/2015 EF improved to 55-60% Last seen by Dr.Benshimon 05/2015 at which time he felt he was high risk for recurrence of atrial fibrillation with his large LA size on 2-D echo and probable OSA. The patient is not a candidate for amiodarone because of an allergy or Tikosyn(CKD). He was referred to EP for further suggestions to help reduce chance of recurrent AF and avoid severe cardiomyopathy but this was never done. He was not placed on an ACE inhibitor or spironolactone because of elevated creatinine. Patient has refused anticoagulation because of rectus sheath bleed in the past.  In 2013 he was admitted with cardiogenic shock in the setting of atrial fibrillation and also had a spontaneous severe rectus sheath hematoma on Coumadin. Patient also has CKD, type 2 diabetes mellitus.  Patient started getting short of breath 3 weeks ago. 1 week ago he stopped his carvedilol because he thought it was causing stomach pains. This am he awakened and his heart was racing and he couldn't breath. He stopped his potassium because it caused sores on his arm. He didn't see EP because he's "hard-headed". BNP 905, troponin 0.13    Past Medical History  Diagnosis Date  . Essential hypertension, benign   . Atrial fibrillation ([place])   . Cardiomyopathy ([place])     Transient cardiomyopathy with EF 30-35% 03/27/12, improved to 55-60% 04/16/12  . Acute renal failure ([place])     03/2012 (not on ACEI due to this) - renal US unremarkable  .  Rectus sheath hematoma     Spontaneous during 03/2012 hospitalization managed conservatively with blood products  . Respiratory failure ([place])     VDRF 03/2012 in setting of CHF  . Acute blood loss anemia     Due to rectus sheath hematoma 03/2012  . Unspecified sleep apnea     Suspected during 03/2012 hospitalization   . Hematuria 03/2012  . Hyperglycemia 03/2012  . Shock ([place])     03/2012 hospitalization - Cardiogenic shock requiring pressors initially, followed by hemorrhagic shock later in hospitalization  . Pleural effusion 03/2012  . CHF (congestive heart failure) [facility])     Past Surgical History  Procedure Laterality Date  . Tee without cardioversion N/A 03/05/2015    Procedure: TRANSESOPHAGEAL ECHOCARDIOGRAM (TEE);  Surgeon: Jolaine Artist, MD;  Location: [facility] ENDOSCOPY;  Service: Cardiovascular;  Laterality: N/A;  . Cardioversion N/A 03/05/2015    Procedure: CARDIOVERSION;  Surgeon: Jolaine Artist, MD;  Location: [facility] ENDOSCOPY;  Service: Cardiovascular;  Laterality: N/A;    Fam Hx: No h/o premature CAD in first degree family members.  Social History:  reports that he has never smoked. He does not have any smokeless tobacco history on file. He reports that he drinks alcohol. He reports that he does not use illicit drugs.  Allergies: No Known Allergies  Medications: Scheduled Meds:  Continuous Infusions: . diltiazem (CARDIZEM) infusion 7.5 mg/hr (05/17/16 1313)   PRN Meds:.   Results for orders placed or performed during the hospital encounter of 05/17/16 (from the past 48 hour(s))  CBC with Differential/Platelet     Status: None  Collection Time: 05/17/16 12:50 PM  Result Value Ref Range   WBC 6.3 4.0 - 10.5 K/uL   RBC 5.51 4.22 - 5.81 MIL/uL   Hemoglobin 16.8 13.0 - 17.0 g/dL   HCT 50.8 39.0 - 52.0 %   MCV 92.2 78.0 - 100.0 fL   MCH 30.5 26.0 - 34.0 pg   MCHC 33.1 30.0 - 36.0 g/dL   RDW 15.4 11.5 - 15.5 %   Platelets 156 150 - 400 K/uL   Neutrophils Relative % 73  %   Neutro Abs 4.6 1.7 - 7.7 K/uL   Lymphocytes Relative 17 %   Lymphs Abs 1.1 0.7 - 4.0 K/uL   Monocytes Relative 9 %   Monocytes Absolute 0.6 0.1 - 1.0 K/uL   Eosinophils Relative 1 %   Eosinophils Absolute 0.0 0.0 - 0.7 K/uL   Basophils Relative 0 %   Basophils Absolute 0.0 0.0 - 0.1 K/uL  Comprehensive metabolic panel     Status: Abnormal   Collection Time: 05/17/16 12:50 PM  Result Value Ref Range   Sodium 140 135 - 145 mmol/L   Potassium 3.8 3.5 - 5.1 mmol/L   Chloride 106 101 - 111 mmol/L   CO2 25 22 - 32 mmol/L   Glucose, Bld 179 (H) 65 - 99 mg/dL   BUN 31 (H) 6 - 20 mg/dL   Creatinine, Ser 1.66 (H) 0.61 - 1.24 mg/dL   Calcium 9.1 8.9 - 10.3 mg/dL   Total Protein 6.9 6.5 - 8.1 g/dL   Albumin 3.5 3.5 - 5.0 g/dL   AST 47 (H) 15 - 41 U/L   ALT 41 17 - 63 U/L   Alkaline Phosphatase 83 38 - 126 U/L   Total Bilirubin 1.1 0.3 - 1.2 mg/dL   GFR calc non Af Amer 41 (L) >60 mL/min   GFR calc Af Amer 48 (L) >60 mL/min    Comment: (NOTE) The eGFR has been calculated using the CKD EPI equation. This calculation has not been validated in all clinical situations. eGFR's persistently <60 mL/min signify possible Chronic Kidney Disease.    Anion gap 9 5 - 15  Brain natriuretic peptide     Status: Abnormal   Collection Time: 05/17/16 12:50 PM  Result Value Ref Range   B Natriuretic Peptide 905.0 (H) 0.0 - 100.0 pg/mL  I-stat chem 8, ed     Status: Abnormal   Collection Time: 05/17/16  1:01 PM  Result Value Ref Range   Sodium 142 135 - 145 mmol/L   Potassium 4.4 3.5 - 5.1 mmol/L   Chloride 105 101 - 111 mmol/L   BUN 40 (H) 6 - 20 mg/dL   Creatinine, Ser 1.70 (H) 0.61 - 1.24 mg/dL   Glucose, Bld 171 (H) 65 - 99 mg/dL   Calcium, Ion 1.08 (L) 1.13 - 1.30 mmol/L   TCO2 24 0 - 100 mmol/L   Hemoglobin 19.4 (H) 13.0 - 17.0 g/dL   HCT 57.0 (H) 39.0 - 52.0 %  I-stat troponin, ED     Status: Abnormal   Collection Time: 05/17/16  1:48 PM  Result Value Ref Range   Troponin i, poc 0.13  (HH) 0.00 - 0.08 ng/mL   Comment NOTIFIED PHYSICIAN    Comment 3            Comment: Due to the release kinetics of cTnI, a negative result within the first hours of the onset of symptoms does not rule out myocardial infarction with certainty. If myocardial infarction is  still suspected, repeat the test at appropriate intervals.     Dg Chest Portable 1 View  05/17/2016  CLINICAL DATA:  Shortness of breath worsening over the last several weeks. EXAM: PORTABLE CHEST 1 VIEW COMPARISON:  03/02/2015 FINDINGS: Opacity obscures the rib right hemidiaphragm and extends up to the right mid lung, obscuring the right heart border. Moderate to prominent enlargement of the cardiopericardial silhouette. The left IJ line has been removed. Left lung appears clear. Aerated part of the right upper lobe appears clear. IMPRESSION: 1. Worsening airspace opacity obscuring the right hemidiaphragm and right heart border. This could represent an enlarging pleural effusion with passive atelectasis but is nonspecific. The patient does have a history of a pleural effusion on the right. This could be further worked up with dedicated chest CT if clinically warranted. 2. Cardiomegaly without evidence of edema. Electronically Signed   By: Van Clines M.D.   On: 05/17/2016 13:12    Review of Systems  Constitutional: Positive for malaise/fatigue.  HENT: Negative.   Eyes: Negative.   Respiratory: Positive for shortness of breath and wheezing.   Cardiovascular: Positive for palpitations and leg swelling.  Gastrointestinal: Positive for abdominal pain.  Genitourinary: Negative.   Musculoskeletal: Negative.   Neurological: Positive for weakness.  Endo/Heme/Allergies: Negative.   All other systems reviewed and are negative.  Blood pressure 121/77, pulse 66, temperature 98.2 F (36.8 C), temperature source Oral, resp. rate 20, SpO2 95 %. Physical Exam  PHYSICAL EXAM: Well-nournished, in no acute distress. Neck:  Increased JVD, HJR, noBruit, or thyroid enlargement Lungs: Decreased breath sounds with some wheezing Cardiovascular: Irreg irreg, PMI not displaced, heart sounds distant, no murmurs, gallops, bruit, thrill, or heave. Abdomen: BS normal. Soft without organomegaly, masses, lesions or tenderness. Extremities: without cyanosis, clubbing or edema. Good distal pulses bilateral SKin: Warm, no lesions or rashes  Musculoskeletal: No deformities Neuro: no focal signs  2Decho 05/2015 Study Conclusions   - Left ventricle: The cavity size was normal. Wall thickness was   increased in a pattern of mild LVH. Systolic function was normal.   The estimated ejection fraction was in the range of 55% to 60%. - Mitral valve: Mitral regurgitation is eccentric, directed along   posterior wall of LA It is mild to moderate in severity. - Left atrium: The atrium was moderately dilated.   Impressions:   - Poor acoustic windows limit study.    Assessment/Plan: Atrial fibrillation with RVR after stopping his carvedilol 1 week ago. Allergic to Amio and Tikosyn. Referred to EP b/c of prior afib mediated cardiomyopathy but patient didn't go. Plan for rate control with IV diltiazem and back to EPS. Resume coreg 6.25 mg BID  Acute on chronic systolic CHF with last EF 55-60% 05/2015 but 20-25% 02/2015 in setting of rapid afib. Diurese with IV lasix '40mg'$  BID. Start potassium  Possible tachycardia mediated cardiomyopathy  CKD  Spontaneous rectus sheath hematoma on Coumadin 2013: refuses anticoagulation.  HTN: stopped bidil.    Martin [name] 05/17/2016, 2:25 PM   The patient was seen and examined, and I agree with the history, physical exam, assessment and plan as documented above which has been discussed with Martin Scale PA-C, with modifications as noted below. Patient's symptoms of progressive exertional dyspnea began after stopping Coreg roughly 3 weeks ago. Says he had stomach discomfort but had been tolerating  it for several months. Refuses anticoagulation as noted above. No longer on Bidil. Echo 06/04/15 showed normal LV systolic function and moderate left atrial enlargement. Never  followed up with EP as Dr. Haroldine Laws had recommended, given prior h/o tachycardia-mediated cardiomyopathy.  Will aim to control HR with diltiazem drip. Once HR is optimally controlled, would obtain echocardiogram to assess for interval change in LVEF.  Will diurese with IV Lasix. I encouraged patient to take meds as prescribed.  Kate Sable, MD, [facility]  05/17/2016 2:59 PM

## 2016-05-17 NOTE — ED Notes (Signed)
Pt being eval by cardiology

## 2016-05-17 NOTE — ED Provider Notes (Signed)
CSN: [id number]     Arrival date & time 05/17/16  1229 History   By signing my name below, I, Emmanuella Mensah, attest that this documentation has been prepared under the direction and in the presence of Bethann Berkshire, MD. Electronically Signed: Angelene Giovanni, ED Scribe. 05/17/2016. 12:54 PM.    Chief Complaint  Patient presents with  . Shortness of Breath  . Atrial Fibrillation   Patient is a 70 y.o. male presenting with shortness of breath. The history is provided by the patient. No language interpreter was used.  Shortness of Breath Severity:  Moderate Onset quality:  Gradual Duration:  2 minutes Progression:  Worsening Relieved by:  None tried Worsened by:  Nothing tried Ineffective treatments:  None tried Associated symptoms: no abdominal pain, no chest pain, no cough, no headaches and no rash    HPI Comments: Martin [name] is a 70 y.o. male with a hx of A-fib, respiratory failure, and CHF who presents to the Emergency Department complaining of gradually worsening SOB onset 3 weeks ago. He reports associated chest tightness onset this am. No alleviating factors noted. Pt has not tried any medications PTA. No fever, chills, or any other complaints at this time.    Past Medical History  Diagnosis Date  . Essential hypertension, benign   . Atrial fibrillation ([facility])   . Cardiomyopathy ([facility])     Transient cardiomyopathy with EF 30-35% 03/27/12, improved to 55-60% 04/16/12  . Acute renal failure ([facility])     03/2012 (not on ACEI due to this) - renal US unremarkable  . Rectus sheath hematoma     Spontaneous during 03/2012 hospitalization managed conservatively with blood products  . Respiratory failure ([facility])     VDRF 03/2012 in setting of CHF  . Acute blood loss anemia     Due to rectus sheath hematoma 03/2012  . Unspecified sleep apnea     Suspected during 03/2012 hospitalization   . Hematuria 03/2012  . Hyperglycemia 03/2012  . Shock ([facility])     03/2012 hospitalization - Cardiogenic  shock requiring pressors initially, followed by hemorrhagic shock later in hospitalization  . Pleural effusion 03/2012  . CHF (congestive heart failure) [facility])    Past Surgical History  Procedure Laterality Date  . Tee without cardioversion N/A 03/05/2015    Procedure: TRANSESOPHAGEAL ECHOCARDIOGRAM (TEE);  Surgeon: Dolores Patty, MD;  Location: [facility] ENDOSCOPY;  Service: Cardiovascular;  Laterality: N/A;  . Cardioversion N/A 03/05/2015    Procedure: CARDIOVERSION;  Surgeon: Dolores Patty, MD;  Location: [facility] ENDOSCOPY;  Service: Cardiovascular;  Laterality: N/A;   No family history on file. Social History  Substance Use Topics  . Smoking status: Never Smoker   . Smokeless tobacco: None  . Alcohol Use: Yes     Comment: former    Review of Systems  Constitutional: Negative for appetite change and fatigue.  HENT: Negative for congestion, ear discharge and sinus pressure.   Eyes: Negative for discharge.  Respiratory: Positive for chest tightness and shortness of breath. Negative for cough.   Cardiovascular: Negative for chest pain.  Gastrointestinal: Negative for abdominal pain and diarrhea.  Genitourinary: Negative for frequency and hematuria.  Musculoskeletal: Negative for back pain.  Skin: Negative for rash.  Neurological: Negative for seizures and headaches.  Psychiatric/Behavioral: Negative for hallucinations.      Allergies  Review of patient's allergies indicates no known allergies.  Home Medications   Prior to Admission medications   Medication Sig Start Date End Date Taking? Authorizing Provider  allopurinol (ZYLOPRIM) 100 MG tablet Take 2 tablets (200 mg total) by mouth daily. 06/26/15   Dolores Patty, MD  carvedilol (COREG) 6.25 MG tablet Take 1 tablet (6.25 mg total) by mouth 2 (two) times daily with a meal. 03/10/15   Laurey Morale, MD  furosemide (LASIX) 40 MG tablet Take 1 tab in AM and 1/2 tab in PM 03/17/15   Laurey Morale, MD  isosorbide-hydrALAZINE  (BIDIL) 20-37.5 MG per tablet Take 2 tablets by mouth 3 (three) times daily. 04/23/15   Dolores Patty, MD  potassium chloride SA (K-DUR,KLOR-CON) 20 MEQ tablet Take 1 tablet (20 mEq total) by mouth 2 (two) times daily. 03/17/15   Laurey Morale, MD   There were no vitals taken for this visit. Physical Exam  Constitutional: He is oriented to person, place, and time. He appears well-developed.  HENT:  Head: Normocephalic.  Eyes: Conjunctivae and EOM are normal. No scleral icterus.  Neck: Neck supple. No thyromegaly present.  Cardiovascular: An irregular rhythm present. Tachycardia present.  Exam reveals no gallop and no friction rub.   No murmur heard. Pulmonary/Chest: No stridor. He has no wheezes. He has no rales. He exhibits no tenderness.  Abdominal: He exhibits no distension. There is no tenderness. There is no rebound.  Musculoskeletal: Normal range of motion. He exhibits no edema.  Lymphadenopathy:    He has no cervical adenopathy.  Neurological: He is oriented to person, place, and time. He exhibits normal muscle tone. Coordination normal.  Skin: No rash noted. No erythema.  Psychiatric: He has a normal mood and affect. His behavior is normal.    ED Course  Procedures (including critical care time) DIAGNOSTIC STUDIES: Oxygen Saturation is 96% on RA, normal by my interpretation.    COORDINATION OF CARE: 12:45 PM- Pt advised of plan for treatment and pt agrees. Pt will receive chest x-ray and lab work for further evaluation. He will also receive Cardizem IM.    Labs Review Labs Reviewed - No data to display  Imaging Review No results found.   Bethann Berkshire, MD has personally reviewed and evaluated these images and lab results as part of his medical decision-making.   EKG Interpretation None     CRITICAL CARE Performed by: Anniah Glick L Total critical care time: 45 minutes Critical care time was exclusive of separately billable procedures and treating other  patients. Critical care was necessary to treat or prevent imminent or life-threatening deterioration. Critical care was time spent personally by me on the following activities: development of treatment plan with patient and/or surrogate as well as nursing, discussions with consultants, evaluation of patient's response to treatment, examination of patient, obtaining history from patient or surrogate, ordering and performing treatments and interventions, ordering and review of laboratory studies, ordering and review of radiographic studies, pulse oximetry and re-evaluation of patient's condition.   MDM   Final diagnoses:  None    Patient will be admitted to stepdown with atrophic with a rapid rate. Cardiology will consult  Bethann Berkshire, MD 05/17/16 [phone]

## 2016-05-17 NOTE — H&P (Addendum)
History and Physical    Martin [name] SHF:[id number] DOB: February 22, 1946 DOA: 05/17/2016  PCP: No PCP Per Patient  Patient coming from: home  Chief Complaint: shortness of breath  HPI: Martin [name] is a 70 y.o. male with medical history significant of chronic systolic congestive heart failure, previous history of atrial fibrillation in the past. He presents to the hospital with complaints of progressive dyspnea on exertion. He reports that he's been taking his fluid medications faithfully, but has not taken any of his "heart medications" for the past 1 week. He reports that he's been progressively short of breath for the past 3 weeks. He denies any chest pain or palpitations. He is just noted that he said increasing dyspnea on exertion. He has a chronic cough. No fever. No nausea, vomiting or diarrhea.  ED Course: He was evaluated in the emergency room where he was found to have evidence of volume overload, chest x-ray indicative of a right-sided pleural effusion and EKG indicating rapid atrial fibrillation. He is being admitted for further treatments.  Review of Systems: As per HPI otherwise 10 point review of systems negative.    Past Medical History  Diagnosis Date  . Essential hypertension, benign   . Atrial fibrillation (HCC)   . Cardiomyopathy (HCC)     Transient cardiomyopathy with EF 30-35% 03/27/12, improved to 55-60% 04/16/12  . Acute renal failure (HCC)     03/2012 (not on ACEI due to this) - renal US unremarkable  . Rectus sheath hematoma     Spontaneous during 03/2012 hospitalization managed conservatively with blood products  . Respiratory failure (HCC)     VDRF 03/2012 in setting of CHF  . Acute blood loss anemia     Due to rectus sheath hematoma 03/2012  . Unspecified sleep apnea     Suspected during 03/2012 hospitalization   . Hematuria 03/2012  . Hyperglycemia 03/2012  . Shock (HCC)     03/2012 hospitalization - Cardiogenic shock requiring pressors initially, followed by  hemorrhagic shock later in hospitalization  . Pleural effusion 03/2012  . CHF (congestive heart failure) [facility])     Past Surgical History  Procedure Laterality Date  . Tee without cardioversion N/A 03/05/2015    Procedure: TRANSESOPHAGEAL ECHOCARDIOGRAM (TEE);  Surgeon: Dolores Patty, MD;  Location: [facility] ENDOSCOPY;  Service: Cardiovascular;  Laterality: N/A;  . Cardioversion N/A 03/05/2015    Procedure: CARDIOVERSION;  Surgeon: Dolores Patty, MD;  Location: [facility] ENDOSCOPY;  Service: Cardiovascular;  Laterality: N/A;     reports that he has never smoked. He does not have any smokeless tobacco history on file. He reports that he drinks alcohol. He reports that he does not use illicit drugs.  No Known Allergies  Family history: brother passed away from complications of diabetes  Prior to Admission medications   Medication Sig Start Date End Date Taking? Authorizing Provider  carvedilol (COREG) 6.25 MG tablet Take 1 tablet (6.25 mg total) by mouth 2 (two) times daily with a meal. 03/10/15  Yes Laurey Morale, MD  furosemide (LASIX) 40 MG tablet Take 1 tab in AM and 1/2 tab in PM 03/17/15  Yes Laurey Morale, MD    Physical Exam: Filed Vitals:   05/17/16 1654 05/17/16 1730 05/17/16 1745 05/17/16 1800  BP: 100/86 112/98 126/112 110/98  Pulse: 117 49 42 148  Temp:      TempSrc:      Resp: 31 35 30 22  SpO2: 95% 99% 93% 92%  Constitutional: NAD, calm, comfortable Filed Vitals:   05/17/16 1654 05/17/16 1730 05/17/16 1745 05/17/16 1800  BP: 100/86 112/98 126/112 110/98  Pulse: 117 49 42 148  Temp:      TempSrc:      Resp: 31 35 30 22  SpO2: 95% 99% 93% 92%   Eyes: PERRL, lids and conjunctivae normal ENMT: Mucous membranes are moist. Posterior pharynx clear of any exudate or lesions.Normal dentition.  Neck: normal, supple, no masses, no thyromegaly Respiratory: diminished breath sounds right base. Normal respiratory effort. No accessory muscle use.  Cardiovascular:  irregular rate and rhythm. 2+ extremity edema. 2+ pedal pulses. No carotid bruits.  Abdomen: no tenderness, large mass in right abdomen that patient has been told is hematoma. No hepatosplenomegaly. Bowel sounds positive.  Musculoskeletal: no clubbing / cyanosis. No joint deformity upper and lower extremities. Good ROM, no contractures. Normal muscle tone.  Skin: no rashes, lesions, ulcers. No induration Neurologic: CN 2-12 grossly intact. Sensation intact, DTR normal. Strength 5/5 in all 4.  Psychiatric: Normal judgment and insight. Alert and oriented x 3. Normal mood.   Labs on Admission: I have personally reviewed following labs and imaging studies  CBC:  Recent Labs Lab 05/17/16 1250 05/17/16 1301  WBC 6.3  --   NEUTROABS 4.6  --   HGB 16.8 19.4*  HCT 50.8 57.0*  MCV 92.2  --   PLT 156  --    Basic Metabolic Panel:  Recent Labs Lab 05/17/16 1250 05/17/16 1301  NA 140 142  K 3.8 4.4  CL 106 105  CO2 25  --   GLUCOSE 179* 171*  BUN 31* 40*  CREATININE 1.66* 1.70*  CALCIUM 9.1  --    GFR: CrCl cannot be calculated (Unknown ideal weight.). Liver Function Tests:  Recent Labs Lab 05/17/16 1250  AST 47*  ALT 41  ALKPHOS 83  BILITOT 1.1  PROT 6.9  ALBUMIN 3.5   No results for input(s): LIPASE, AMYLASE in the last 168 hours. No results for input(s): AMMONIA in the last 168 hours. Coagulation Profile: No results for input(s): INR, PROTIME in the last 168 hours. Cardiac Enzymes: No results for input(s): CKTOTAL, CKMB, CKMBINDEX, TROPONINI in the last 168 hours. BNP (last 3 results) No results for input(s): PROBNP in the last 8760 hours. HbA1C: No results for input(s): HGBA1C in the last 72 hours. CBG: No results for input(s): GLUCAP in the last 168 hours. Lipid Profile: No results for input(s): CHOL, HDL, LDLCALC, TRIG, CHOLHDL, LDLDIRECT in the last 72 hours. Thyroid Function Tests: No results for input(s): TSH, T4TOTAL, FREET4, T3FREE, THYROIDAB in the  last 72 hours. Anemia Panel: No results for input(s): VITAMINB12, FOLATE, FERRITIN, TIBC, IRON, RETICCTPCT in the last 72 hours. Urine analysis:    Component Value Date/Time   COLORURINE YELLOW 03/02/2015 1450   APPEARANCEUR CLEAR 03/02/2015 1450   LABSPEC 1.010 03/02/2015 1450   PHURINE 5.0 03/02/2015 1450   GLUCOSEU NEGATIVE 03/02/2015 1450   HGBUR LARGE* 03/02/2015 1450   BILIRUBINUR NEGATIVE 03/02/2015 1450   KETONESUR NEGATIVE 03/02/2015 1450   PROTEINUR 30* 03/02/2015 1450   UROBILINOGEN 1.0 03/02/2015 1450   NITRITE NEGATIVE 03/02/2015 1450   LEUKOCYTESUR NEGATIVE 03/02/2015 1450   Sepsis Labs: !!!!!!!!!!!!!!!!!!!!!!!!!!!!!!!!!!!!!!!!!!!! (procalcitonin:4,lacticidven:4) )No results found for this or any previous visit (from the past 240 hour(s)).   Radiological Exams on Admission: Dg Chest Portable 1 View  05/17/2016  CLINICAL DATA:  Shortness of breath worsening over the last several weeks. EXAM: PORTABLE CHEST 1 VIEW COMPARISON:  03/02/2015 FINDINGS: Opacity obscures the rib right hemidiaphragm and extends up to the right mid lung, obscuring the right heart border. Moderate to prominent enlargement of the cardiopericardial silhouette. The left IJ line has been removed. Left lung appears clear. Aerated part of the right upper lobe appears clear. IMPRESSION: 1. Worsening airspace opacity obscuring the right hemidiaphragm and right heart border. This could represent an enlarging pleural effusion with passive atelectasis but is nonspecific. The patient does have a history of a pleural effusion on the right. This could be further worked up with dedicated chest CT if clinically warranted. 2. Cardiomegaly without evidence of edema. Electronically Signed   By: Gaylyn Rong M.D.   On: 05/17/2016 13:12    EKG: Independently reviewed. A fib rvr  Assessment/Plan Active Problems:   Secondary cardiomyopathy (HCC)   CKD (chronic kidney disease) stage 3, GFR 30-59 ml/min    Atrial fibrillation with RVR (HCC)   Acute on chronic systolic CHF (congestive heart failure) (HCC)   Pleural effusion, right  1. Atrial fibrillation with RVR. CHADSVASC 4. Likely related to noncompliance of medications. He is restarted on home dose of Coreg. He is also on a Cardizem infusion for heart rate control. The patient has refused anticoagulation. Echocardiogram will be pursued once heart rate is controlled. Check TSH.  2. Acute on chronic systolic congestive heart failure. Previously, he was noted to be 20-25% in 02/2015. With treatments, this had improved to 55-60% in 05/2015. He has evidence of volume overload at this time. He was started on IV Lasix. He is continued on Coreg. He is not a candidate for ace inhibitors due to renal dysfunction. Echocardiogram will be repeated once heart rate is better controlled  3. Chronic kidney disease stage III. Creatinine appears to be near baseline. Continue to monitor in the setting of diuresis.  4. Right-sided pleural effusion. This is likely related to congestive heart failure. Would monitor at this time with diuresis since he is not appear to be in respiratory distress and is breathing comfortably on room air at rest. If this does not improve with diuresis, consider thoracentesis.  5. Secondary cardiomyopathy. Likely related to chronic tachycardia.   DVT prophylaxis: lovenox Code Status: full code Family Communication: discussed with patient Disposition Plan: discharge home once improved Consults called: cardiology Admission status: inpatient, step down   MEMON,JEHANZEB MD [facility] Pager [phone]  If 7PM-7AM, please contact night-coverage [email or web address] Password [facility]  05/17/2016, 6:14 PM

## 2016-05-17 NOTE — ED Notes (Addendum)
Pt comes in with shortness of breath starting this morning. Shortly after pt began to have chest tightness and fluttering. Pt has hx of CHF and hasn't been taking in like he is suppose to for the last week.      Pt heart rate 175-180 with hx of A-fib

## 2016-05-17 NOTE — ED Notes (Signed)
MD made aware of pt HR between 120-140. Verbal order for cardizem given.

## 2016-05-18 ENCOUNTER — Inpatient Hospital Stay (HOSPITAL_COMMUNITY): Payer: Medicare Other

## 2016-05-18 DIAGNOSIS — I5023 Acute on chronic systolic (congestive) heart failure: Secondary | ICD-10-CM

## 2016-05-18 DIAGNOSIS — R570 Cardiogenic shock: Secondary | ICD-10-CM

## 2016-05-18 DIAGNOSIS — N179 Acute kidney failure, unspecified: Secondary | ICD-10-CM | POA: Insufficient documentation

## 2016-05-18 DIAGNOSIS — D696 Thrombocytopenia, unspecified: Secondary | ICD-10-CM | POA: Diagnosis not present

## 2016-05-18 DIAGNOSIS — J948 Other specified pleural conditions: Secondary | ICD-10-CM

## 2016-05-18 DIAGNOSIS — I5043 Acute on chronic combined systolic (congestive) and diastolic (congestive) heart failure: Secondary | ICD-10-CM

## 2016-05-18 DIAGNOSIS — I5033 Acute on chronic diastolic (congestive) heart failure: Secondary | ICD-10-CM | POA: Insufficient documentation

## 2016-05-18 DIAGNOSIS — N189 Chronic kidney disease, unspecified: Secondary | ICD-10-CM

## 2016-05-18 DIAGNOSIS — I214 Non-ST elevation (NSTEMI) myocardial infarction: Secondary | ICD-10-CM | POA: Diagnosis present

## 2016-05-18 DIAGNOSIS — E875 Hyperkalemia: Secondary | ICD-10-CM | POA: Diagnosis not present

## 2016-05-18 DIAGNOSIS — N183 Chronic kidney disease, stage 3 (moderate): Secondary | ICD-10-CM

## 2016-05-18 DIAGNOSIS — I4891 Unspecified atrial fibrillation: Secondary | ICD-10-CM

## 2016-05-18 LAB — BASIC METABOLIC PANEL
ANION GAP: 9 (ref 5–15)
Anion gap: 11 (ref 5–15)
Anion gap: 10 (ref 5–15)
BUN: 35 mg/dL — ABNORMAL HIGH (ref 6–20)
BUN: 37 mg/dL — AB (ref 6–20)
BUN: 38 mg/dL — ABNORMAL HIGH (ref 6–20)
CALCIUM: 9.2 mg/dL (ref 8.9–10.3)
CHLORIDE: 104 mmol/L (ref 101–111)
CHLORIDE: 102 mmol/L (ref 101–111)
CO2: 24 mmol/L (ref 22–32)
CO2: 21 mmol/L — ABNORMAL LOW (ref 22–32)
CO2: 23 mmol/L (ref 22–32)
CREATININE: 2.56 mg/dL — AB (ref 0.61–1.24)
CREATININE: 3.2 mg/dL — AB (ref 0.61–1.24)
Calcium: 9.2 mg/dL (ref 8.9–10.3)
Calcium: 9 mg/dL (ref 8.9–10.3)
Chloride: 105 mmol/L (ref 101–111)
Creatinine, Ser: 2.26 mg/dL — ABNORMAL HIGH (ref 0.61–1.24)
GFR calc Af Amer: 28 mL/min — ABNORMAL LOW (ref >60)
GFR calc Af Amer: 22 mL/min — ABNORMAL LOW (ref >60)
GFR calc non Af Amer: 19 mL/min — ABNORMAL LOW (ref >60)
GFR, EST AFRICAN AMERICAN: 33 mL/min — AB (ref >60)
GFR, EST NON AFRICAN AMERICAN: 28 mL/min — AB (ref >60)
GFR, EST NON AFRICAN AMERICAN: 24 mL/min — AB (ref >60)
GLUCOSE: 211 mg/dL — AB (ref 65–99)
Glucose, Bld: 138 mg/dL — ABNORMAL HIGH (ref 65–99)
Glucose, Bld: 139 mg/dL — ABNORMAL HIGH (ref 65–99)
POTASSIUM: 5.6 mmol/L — AB (ref 3.5–5.1)
Potassium: 5.9 mmol/L — ABNORMAL HIGH (ref 3.5–5.1)
Potassium: 5.8 mmol/L — ABNORMAL HIGH (ref 3.5–5.1)
SODIUM: 137 mmol/L (ref 135–145)
SODIUM: 137 mmol/L (ref 135–145)
Sodium: 135 mmol/L (ref 135–145)

## 2016-05-18 LAB — CARBOXYHEMOGLOBIN
CARBOXYHEMOGLOBIN: 0.9 % (ref 0.5–1.5)
CARBOXYHEMOGLOBIN: 0.5 % (ref 0.5–1.5)
METHEMOGLOBIN: 0.8 % (ref 0.0–1.5)
Methemoglobin: 0.6 % (ref 0.0–1.5)
O2 SAT: 52.3 %
O2 Saturation: 45.9 %
Total hemoglobin: 16.4 g/dL (ref 13.5–18.0)
Total hemoglobin: 16.1 g/dL (ref 13.5–18.0)

## 2016-05-18 LAB — CBC
HCT: 50.8 % (ref 39.0–52.0)
HEMOGLOBIN: 16.6 g/dL (ref 13.0–17.0)
MCH: 30.3 pg (ref 26.0–34.0)
MCHC: 32.7 g/dL (ref 30.0–36.0)
MCV: 92.9 fL (ref 78.0–100.0)
PLATELETS: 148 10*3/uL — AB (ref 150–400)
RBC: 5.47 MIL/uL (ref 4.22–5.81)
RDW: 15.5 % (ref 11.5–15.5)
WBC: 8 10*3/uL (ref 4.0–10.5)

## 2016-05-18 LAB — PROTIME-INR
INR: 1.28 (ref 0.00–1.49)
PROTHROMBIN TIME: 16.1 s — AB (ref 11.6–15.2)

## 2016-05-18 LAB — MRSA PCR SCREENING
MRSA BY PCR: NEGATIVE
MRSA by PCR: NEGATIVE

## 2016-05-18 LAB — TROPONIN I
TROPONIN I: 25.87 ng/mL — AB (ref <0.031)
TROPONIN I: 37.38 ng/mL — AB (ref <0.031)

## 2016-05-18 LAB — MAGNESIUM: Magnesium: 2.1 mg/dL (ref 1.7–2.4)

## 2016-05-18 MED ORDER — POTASSIUM CHLORIDE CRYS ER 20 MEQ PO TBCR: 20.0000 meq | EXTENDED_RELEASE_TABLET | Freq: Two times a day (BID) | ORAL | Status: DC

## 2016-05-18 MED ORDER — AMIODARONE HCL IN DEXTROSE 360-4.14 MG/200ML-% IV SOLN
30.0000 mg/h | INTRAVENOUS | Status: DC
  Administered 2016-05-18: 30 mg/h via INTRAVENOUS
  Filled 2016-05-18: qty 200

## 2016-05-18 MED ORDER — HEPARIN (PORCINE) IN NACL 100-0.45 UNIT/ML-% IJ SOLN
1050.0000 [IU]/h | INTRAMUSCULAR | Status: DC
  Filled 2016-05-18: qty 250

## 2016-05-18 MED ORDER — ASPIRIN EC 81 MG PO TBEC
81.0000 mg | DELAYED_RELEASE_TABLET | Freq: Every day | ORAL | Status: DC
  Administered 2016-05-19 – 2016-05-25 (×7): 81 mg via ORAL
  Filled 2016-05-18 (×7): qty 1

## 2016-05-18 MED ORDER — LIDOCAINE HCL (PF) 1 % IJ SOLN
INTRAMUSCULAR | Status: AC
  Filled 2016-05-18: qty 5

## 2016-05-18 MED ORDER — FUROSEMIDE 10 MG/ML IJ SOLN: 80.0000 mg | Freq: Two times a day (BID) | INTRAMUSCULAR | Status: DC

## 2016-05-18 MED ORDER — FUROSEMIDE 10 MG/ML IJ SOLN
15.0000 mg/h | INTRAVENOUS | Status: DC
  Administered 2016-05-18 – 2016-05-21 (×7): 20 mg/h via INTRAVENOUS
  Administered 2016-05-23: 15 mg/h via INTRAVENOUS
  Filled 2016-05-18 (×18): qty 25

## 2016-05-18 MED ORDER — AMIODARONE IV BOLUS ONLY 150 MG/100ML
150.0000 mg | Freq: Once | INTRAVENOUS | Status: AC
  Administered 2016-05-18: 150 mg via INTRAVENOUS
  Filled 2016-05-18: qty 100

## 2016-05-18 MED ORDER — CETYLPYRIDINIUM CHLORIDE 0.05 % MT LIQD
7.0000 mL | Freq: Two times a day (BID) | OROMUCOSAL | Status: DC
  Administered 2016-05-18 – 2016-05-26 (×16): 7 mL via OROMUCOSAL

## 2016-05-18 MED ORDER — HEPARIN BOLUS VIA INFUSION
2000.0000 [IU] | Freq: Once | INTRAVENOUS | Status: DC
  Filled 2016-05-18: qty 2000

## 2016-05-18 MED ORDER — SODIUM CHLORIDE 0.9% FLUSH
10.0000 mL | Freq: Two times a day (BID) | INTRAVENOUS | Status: DC
  Administered 2016-05-18: 30 mL
  Administered 2016-05-18: 20 mL
  Administered 2016-05-19 – 2016-05-20 (×4): 10 mL
  Administered 2016-05-21: 20 mL
  Administered 2016-05-21 – 2016-05-26 (×9): 10 mL

## 2016-05-18 MED ORDER — SODIUM POLYSTYRENE SULFONATE 15 GM/60ML PO SUSP
30.0000 g | Freq: Once | ORAL | Status: AC
  Administered 2016-05-18: 30 g via ORAL
  Filled 2016-05-18: qty 120

## 2016-05-18 MED ORDER — SODIUM CHLORIDE 0.9% FLUSH: 10.0000 mL | INTRAVENOUS | Status: DC | PRN

## 2016-05-18 MED ORDER — ASPIRIN 325 MG PO TABS
325.0000 mg | ORAL_TABLET | Freq: Once | ORAL | Status: AC
  Administered 2016-05-18: 325 mg via ORAL

## 2016-05-18 MED ORDER — MILRINONE LACTATE IN DEXTROSE 20-5 MG/100ML-% IV SOLN
0.3750 ug/kg/min | INTRAVENOUS | Status: DC
  Administered 2016-05-18: 0.25 ug/kg/min via INTRAVENOUS
  Administered 2016-05-19 – 2016-05-23 (×12): 0.375 ug/kg/min via INTRAVENOUS
  Filled 2016-05-18 (×14): qty 100

## 2016-05-18 MED ORDER — NOREPINEPHRINE BITARTRATE 1 MG/ML IV SOLN
0.0000 ug/min | INTRAVENOUS | Status: DC
  Administered 2016-05-18: 15 ug/min via INTRAVENOUS
  Filled 2016-05-18: qty 16

## 2016-05-18 MED ORDER — AMIODARONE HCL IN DEXTROSE 360-4.14 MG/200ML-% IV SOLN
60.0000 mg/h | INTRAVENOUS | Status: AC
  Administered 2016-05-18 (×2): 60 mg/h via INTRAVENOUS
  Filled 2016-05-18 (×2): qty 200

## 2016-05-18 MED ORDER — FUROSEMIDE 10 MG/ML IJ SOLN
80.0000 mg | Freq: Once | INTRAMUSCULAR | Status: AC
  Administered 2016-05-18: 80 mg via INTRAVENOUS
  Filled 2016-05-18: qty 8

## 2016-05-18 MED ORDER — ASPIRIN 325 MG PO TABS
ORAL_TABLET | ORAL | Status: AC
  Filled 2016-05-18: qty 1

## 2016-05-18 MED ORDER — AMIODARONE LOAD VIA INFUSION
150.0000 mg | Freq: Once | INTRAVENOUS | Status: AC
  Administered 2016-05-18: 150 mg via INTRAVENOUS
  Filled 2016-05-18: qty 83.34

## 2016-05-18 MED ORDER — NOREPINEPHRINE BITARTRATE 1 MG/ML IV SOLN
0.0000 ug/min | INTRAVENOUS | Status: DC
  Administered 2016-05-18: 10 ug/min via INTRAVENOUS
  Filled 2016-05-18: qty 4

## 2016-05-18 MED ORDER — AMIODARONE LOAD VIA INFUSION
150.0000 mg | Freq: Once | INTRAVENOUS | Status: AC
  Administered 2016-05-18: 150 mg via INTRAVENOUS

## 2016-05-18 MED ORDER — AMIODARONE HCL IN DEXTROSE 360-4.14 MG/200ML-% IV SOLN
30.0000 mg/h | INTRAVENOUS | Status: DC
  Administered 2016-05-18 – 2016-05-24 (×24): 60 mg/h via INTRAVENOUS
  Administered 2016-05-25: 30 mg/h via INTRAVENOUS
  Administered 2016-05-25: 60 mg/h via INTRAVENOUS
  Filled 2016-05-18 (×29): qty 200

## 2016-05-18 NOTE — Progress Notes (Signed)
Paged cards re: afib with rates sustaining in 130-140s. Discussed poor urine output with blood still in urine and intermittent wheezing; however, patient denies shortness of breath.. Plan to rebolus 150 of amio now. Will continue to monitor closely.

## 2016-05-18 NOTE — Progress Notes (Signed)
  Amiodarone Drug - Drug Interaction Consult Note  Recommendations: Meds ok for now  Amiodarone is metabolized by the cytochrome P450 system and therefore has the potential to cause many drug interactions. Amiodarone has an average plasma half-life of 50 days (range 20 to 100 days).   There is potential for drug interactions to occur several weeks or months after stopping treatment and the onset of drug interactions may be slow after initiating amiodarone.   []  Statins: Increased risk of myopathy. Simvastatin- restrict dose to 20mg  daily. Other statins: counsel patients to report any muscle pain or weakness immediately.  []  Anticoagulants: Amiodarone can increase anticoagulant effect. Consider warfarin dose reduction. Patients should be monitored closely and the dose of anticoagulant altered accordingly, remembering that amiodarone levels take several weeks to stabilize.  []  Antiepileptics: Amiodarone can increase plasma concentration of phenytoin, the dose should be reduced. Note that small changes in phenytoin dose can result in large changes in levels. Monitor patient and counsel on signs of toxicity.  []  Beta blockers: increased risk of bradycardia, AV block and myocardial depression. Sotalol - avoid concomitant use.  []   Calcium channel blockers (diltiazem and verapamil): increased risk of bradycardia, AV block and myocardial depression.  []   Cyclosporine: Amiodarone increases levels of cyclosporine. Reduced dose of cyclosporine is recommended.  []  Digoxin dose should be halved when amiodarone is started.  []  Diuretics: increased risk of cardiotoxicity if hypokalemia occurs.  []  Oral hypoglycemic agents (glyburide, glipizide, glimepiride): increased risk of hypoglycemia. Patient's glucose levels should be monitored closely when initiating amiodarone therapy.   []  Drugs that prolong the QT interval:  Torsades de pointes risk may be increased with concurrent use - avoid if possible.   Monitor QTc, also keep magnesium/potassium WNL if concurrent therapy can't be avoided. Marland Kitchen Antibiotics: e.g. fluoroquinolones, erythromycin. . Antiarrhythmics: e.g. quinidine, procainamide, disopyramide, sotalol. . Antipsychotics: e.g. phenothiazines, haloperidol.  . Lithium, tricyclic antidepressants, and methadone. Thank You,   Leota Sauers Pharm.D. CPP, BCPS Clinical Pharmacist [phone] 05/18/2016 4:21 PM

## 2016-05-18 NOTE — Progress Notes (Addendum)
CRITICAL VALUE ALERT  Critical value received:  Troponin 37.38  Date of notification:  05/18/16  Time of notification:  0640  Critical value read back:Yes.    Nurse who received alert:  Marylu Lund  MD notified (1st page):  Dr. Onalee Hua  Time of first page:  0645  MD notified (2nd page): Dr. Sherrie Mustache  Time of second page: 0720  Responding MD:  Dr. Sherrie Mustache  Time MD responded:  [phone]

## 2016-05-18 NOTE — Progress Notes (Signed)
Urine changed colors from yellow to bright red. Jenetta Loges D notified about change since patient is supposed to have heparin started 6pm. Greig Castilla to follow up.

## 2016-05-18 NOTE — Progress Notes (Signed)
Subjective:  Doesn't feel well. Hypotensive last night (after Coreg) and diltiazem IV stopped temporarily, now HR up 130. No chest pain.  Objective:  Vital Signs in the last 24 hours: Temp:  [96 F (35.6 C)-98.2 F (36.8 C)] 96.3 F (35.7 C) (05/24 0400) Pulse Rate:  [31-164] 33 (05/24 0600) Resp:  [9-37] 25 (05/24 0845) BP: (73-153)/(42-143) 122/106 mmHg (05/24 0845) SpO2:  [7 %-100 %] 7 % (05/24 0830) Weight:  [211 lb 3.2 oz (95.8 kg)] 211 lb 3.2 oz (95.8 kg) (05/24 0500)  Intake/Output from previous day: 05/23 0701 - 05/24 0700 In: 475.8 [P.O.:360; I.V.:115.8] Out: 700 [Urine:700]  Physical Exam: NECK: Elevated JVP, no bruit LUNGS: Decreased breath sounds without rales HEART: Irregular rate and rhythm, 2/6 systolic murmur LSB,no gallop, rub, bruit, thrill, or heave EXTREMITIES: plus 2 edema bilaterally  Lab Results:  Recent Labs  05/17/16 1250 05/17/16 1301 05/18/16 0439  WBC 6.3  --  8.0  HGB 16.8 19.4* 16.6  PLT 156  --  148*    Recent Labs  05/17/16 1250 05/17/16 1301 05/18/16 0439  NA 140 142 137  K 3.8 4.4 5.6*  CL 106 105 104  CO2 25  --  24  GLUCOSE 179* 171* 138*  BUN 31* 40* 35*  CREATININE 1.66* 1.70* 2.26*    Recent Labs  05/17/16 2348 05/18/16 0439  TROPONINI 25.87* 37.38*   Hepatic Function Panel  Recent Labs  05/17/16 1250  PROT 6.9  ALBUMIN 3.5  AST 47*  ALT 41  ALKPHOS 83  BILITOT 1.1    Chest x-ray 05/17/2016: IMPRESSION: 1. Worsening airspace opacity obscuring the right hemidiaphragm and right heart border. This could represent an enlarging pleural effusion with passive atelectasis but is nonspecific. The patient does have a history of a pleural effusion on the right. This could be further worked up with dedicated chest CT if clinically warranted. 2. Cardiomegaly without evidence of edema.   Cardiac Studies: Echocardiogram 06/04/2015: Study Conclusions  - Left ventricle: The cavity size was normal. Wall  thickness was  increased in a pattern of mild LVH. Systolic function was normal.  The estimated ejection fraction was in the range of 55% to 60%. - Mitral valve: Mitral regurgitation is eccentric, directed along  posterior wall of LA It is mild to moderate in severity. - Left atrium: The atrium was moderately dilated.  Impressions:  - Poor acoustic windows limit study.  Assessment/Plan:   Atrial fibrillation with RVR after stopping his carvedilol 1-3 weeks ago. Allergic to Amiodarone and Tikosyn. Referred to EP b/c of prior afib mediated cardiomyopathy but patient didn't go. Plan for rate control with IV diltiazem for now. Patient became hypotensive last night and IV dilt stopped temporarily. Will continue diltiazem as tolerated. Hold Coreg for now. He is very tenuous with prior history of decompensation with atrial fibrillation. Will transfer to [facility] ICU and have Dr. Clarise Cruz and EPS reassess.  NSTEMI with Troponins rising to 37.38. Crt up to 2.26 and K 5.6. Can't cath now, but transfer and probably cath once stablilized.   Acute on chronic systolic CHF with last EF 55-60% 05/2015 but 20-25% 02/2015 in setting of rapid afib. On IV lasix 40mg  BID. Only negative 224? Accuracy. See CXR above. Has required norepinephrine, milrinone, and high dose Lasix boluses + metolazone in past with cardiomyopathy.  History of tachycardia mediated cardiomyopathy  Acute on chronic renal failure.  Spontaneous rectus sheath hematoma on Coumadin 2013: refuses anticoagulation.  HTN: patient stopped bidil.  LOS: 1 day   Jacolyn Reedy PA-C  05/18/2016, 9:32 AM   Attending note:  Patient seen and examined. Reviewed records and consult note by Dr. Gilman Schmidt, discussed the case with Ms. Geni Bers PA-C. Mr. Englert has a history of tachycardia mediated cardiomyopathy with decompensation in the setting of atrial fibrillation requiring advanced hemodynamic support, allergies to amiodarone in Tikosyn, and  recent noncompliance with medical therapy and follow-up. He presents with rapid atrial fibrillation and suspected acute on chronic combined heart failure, although his last LVEF had improved to the range of 55-60% as of June 2016. He has had significant weight gain and leg edema, worsening fatigue and shortness of breath. Initially placed on diltiazem for heart rate control, later became hypotensive after receiving a dose of Coreg, and at this point is on IV diltiazem along with Lasix.  On examination he is in no acute distress. Elevated JVP noted, diminished lung sounds decreased at the right base with rales, cardiac exam with indistinct PMI and irregularly irregular rhythm, no obvious S3 at current heart rate. He has evidence of abdominal protuberance and significant leg edema up to the thighs. Lab work shows worsening creatinine up to 2.2, potassium 5.6, significant increase in troponin I from 0.47 up to 25.87 and then 37.38. ECG from 05/17/2016 shows rapid atrial fibrillation with PVC versus aberrantly conducted complex, diffuse ST-T wave abnormalities and low voltage.  Patient presents with rapid atrial fibrillation in association with suspected acute on chronic combined heart failure with significant volume overload and prior history of tachycardia mediated cardiomyopathy, medication noncompliance, acute on chronic renal insufficiency, and NSTEMI with troponin I up to 37 in the absence of active chest pain. No defined history of obstructive CAD, has not undergone cardiac catheterization in the past. He is tenuous overall. Plan is to have him transferred to our cardiology service at [facility] in the ICU for consultation with Dr. Gala Romney and also EP. Continue IV diltiazem for now for heart rate control, although certainly not optimal long-term with his history. He will need a follow-up echocardiogram when heart rate is better controlled, and ultimately a left and right heart catheterization presuming his  renal function stabilizes. He has declined anticoagulation with previous history of spontaneous rectus sheath hematoma.  Jonelle Sidle, M.D., F.A.C.C.

## 2016-05-18 NOTE — Consult Note (Addendum)
Advanced Heart Failure Team Consult Note  Referring Physician: Diona Browner Primary Physician: Primary Cardiologist:    Reason for Consultation: A/C systolic HF/ Afib RVR  HPI:    Martin [name] is a 70 y.o. male with history of prior cardiomyopathy, afib, HTN, Hx of Rectus sheath hematoma while taking coumadin, CKD stage III, and DM type II.    Patient was admitted in 4/13 with cardiogenic shock in the setting of atrial fibrillation, EF 30-35% by echo at that time. With medical treatment, he improved, and later in 4/13, EF was up to normal range. He was started on coumadin for atrial fibrillation but developed a rectus sheath hematoma (large) and anticoagulation was stopped. He was then lost to cardiology followup for several years.   Patient was admitted to [facility] 03/01/15 with dyspnea and orthopnea. He was not taking Lasix. He was noted to be in atypical flutter with RVR and in CHF. Initially required norepinephrine, milrinone, and high dose Lasix boluses + metolazone. He diuresed well eventually but creatinine rose up to 3.4. Echo showed EF 20-25% with RV systolic dysfunction. Amiodarone gtt and Eliquis was started. He had DCCV back to NSR. Ranolazine was added to try to maintain NSR.   Last seen in HF clinic 05/2015. Echo at that time thought to be ~40-45%. He was referred to EP but pt cancelled that appointment. Has not been seen in follow up since.  He presented to [facility] 05/17/16 with complaints of worsening SOB for several weeks.  He had stopped his coreg because he thought it was causing stomach pains, and also came off of his Bidil. He stopped potassium because it caused "sores on his arm".  Pertinent admission labs include Creatine 1.7, K 4.4, BNP 905. Troponin 0.47 -> 25.87 -> 37.38. Creatinine 1.7 -> 2.26 -> 2.56.  He is currently SOB at rest worse with any exertion. Denies active chest pain. States that he would like short term dialysis, intubation, and CPR if it comes to  that.  Has not been peeing very much.   Review of Systems: [y] = yes, [ ]  = no   General: Weight gain [y]; Weight loss [ ] ; Anorexia [ ] ; Fatigue [y]; Fever [ ] ; Chills [ ] ; Weakness [y]  Cardiac: Chest pain/pressure [ ] ; Resting SOB [y]; Exertional SOB [y]; Orthopnea [y]; Pedal Edema [y]; Palpitations [ ] ; Syncope [ ] ; Presyncope [ ] ; Paroxysmal nocturnal dyspnea[ ]   Pulmonary: Cough [ ] ; Wheezing[ ] ; Hemoptysis[ ] ; Sputum [ ] ; Snoring [ ]   GI: Vomiting[ ] ; Dysphagia[ ] ; Melena[ ] ; Hematochezia [ ] ; Heartburn[ ] ; Abdominal pain [ ] ; Constipation [ ] ; Diarrhea [ ] ; BRBPR [ ]   GU: Hematuria[ ] ; Dysuria [ ] ; Nocturia[ ]   Vascular: Pain in legs with walking [ ] ; Pain in feet with lying flat [ ] ; Non-healing sores [ ] ; Stroke [ ] ; TIA [ ] ; Slurred speech [ ] ;  Neuro: Headaches[ ] ; Vertigo[ ] ; Seizures[ ] ; Paresthesias[ ] ;Blurred vision [ ] ; Diplopia [ ] ; Vision changes [ ]   Ortho/Skin: Arthritis [y]; Joint pain [y]; Muscle pain [ ] ; Joint swelling [ ] ; Back Pain [ ] ; Rash [ ]   Psych: Depression[ ] ; Anxiety[ ]   Heme: Bleeding problems [ ] ; Clotting disorders [ ] ; Anemia [ ]   Endocrine: Diabetes [ ] ; Thyroid dysfunction[ ]   Home Medications Prior to Admission medications   Medication Sig Start Date End Date Taking? Authorizing Provider  carvedilol (COREG) 6.25 MG tablet Take 1 tablet (6.25 mg total) by mouth 2 (two) times  daily with a meal. 03/10/15  Yes Laurey Morale, MD  furosemide (LASIX) 40 MG tablet Take 1 tab in AM and 1/2 tab in PM 03/17/15  Yes Laurey Morale, MD    Past Medical History: Past Medical History  Diagnosis Date  . Essential hypertension, benign   . Atrial fibrillation (HCC)   . Cardiomyopathy (HCC)     Transient cardiomyopathy with EF 30-35% 03/27/12, improved to 55-60% 04/16/12  . Acute renal failure (HCC)     03/2012 (not on ACEI due to this) - renal US unremarkable  . Rectus sheath hematoma     Spontaneous during 03/2012 hospitalization managed conservatively with blood  products  . Respiratory failure (HCC)     VDRF 03/2012 in setting of CHF  . Acute blood loss anemia     Due to rectus sheath hematoma 03/2012  . Unspecified sleep apnea     Suspected during 03/2012 hospitalization   . Hematuria 03/2012  . Hyperglycemia 03/2012  . Shock (HCC)     03/2012 hospitalization - Cardiogenic shock requiring pressors initially, followed by hemorrhagic shock later in hospitalization  . Pleural effusion 03/2012  . CHF (congestive heart failure) (HCC)     Past Surgical History: Past Surgical History  Procedure Laterality Date  . Tee without cardioversion N/A 03/05/2015    Procedure: TRANSESOPHAGEAL ECHOCARDIOGRAM (TEE);  Surgeon: Dolores Patty, MD;  Location: [facility] ENDOSCOPY;  Service: Cardiovascular;  Laterality: N/A;  . Cardioversion N/A 03/05/2015    Procedure: CARDIOVERSION;  Surgeon: Dolores Patty, MD;  Location: [facility] ENDOSCOPY;  Service: Cardiovascular;  Laterality: N/A;    Family History: No family history on file.  Social History: Social History   Social History  . Marital Status: Married    Spouse Name: N/A  . Number of Children: N/A  . Years of Education: N/A   Social History Main Topics  . Smoking status: Never Smoker   . Smokeless tobacco: None  . Alcohol Use: Yes     Comment: former  . Drug Use: No  . Sexual Activity: Yes   Other Topics Concern  . None   Social History Narrative    Allergies:  No Known Allergies  Objective:    Vital Signs:   Temp:  [96 F (35.6 C)-98.2 F (36.8 C)] 97.4 F (36.3 C) (05/24 1115) Pulse Rate:  [31-164] 33 (05/24 0600) Resp:  [9-37] 30 (05/24 0930) BP: (73-153)/(42-143) 103/89 mmHg (05/24 0930) SpO2:  [7 %-100 %] 99 % (05/24 0915) Weight:  [211 lb 3.2 oz (95.8 kg)] 211 lb 3.2 oz (95.8 kg) (05/24 0500) Last BM Date: 05/17/16  Weight change: Filed Weights   05/17/16 1830 05/18/16 0500  Weight: 211 lb 3.2 oz (95.8 kg) 211 lb 3.2 oz (95.8 kg)    Intake/Output:   Intake/Output Summary  (Last 24 hours) at 05/18/16 1208 Last data filed at 05/18/16 0400  Gross per 24 hour  Intake 475.83 ml  Output    700 ml  Net -224.17 ml     Physical Exam: General:  Chronically ill and fatigued appearing. Wife present.  HEENT: Pale, 02 via [place] Neck: supple. JVP to ear. Carotids 2+ bilat; no bruits. No lymphadenopathy or thyromegaly appreciated. Cor: Irregular rate, tachy.  +S3.  Lungs: Diminished throughout Abdomen: soft, NT, mild/mod distended no HSM. No bruits or masses. +BS  Extremities: no cyanosis, clubbing, rash. 3+ edema into thighs. Extremely cold to the touch Neuro: alert & orientedx3, cranial nerves grossly intact. moves all 4 extremities w/o  difficulty. Affect flat  Telemetry: Afib RVR 110s  Labs: Basic Metabolic Panel:  Recent Labs Lab 05/17/16 1250 05/17/16 1301 05/18/16 0439 05/18/16 0910  NA 140 142 137 137  K 3.8 4.4 5.6* 5.9*  CL 106 105 104 105  CO2 25  --  24 21*  GLUCOSE 179* 171* 138* 139*  BUN 31* 40* 35* 37*  CREATININE 1.66* 1.70* 2.26* 2.56*  CALCIUM 9.1  --  9.2 9.0    Liver Function Tests:  Recent Labs Lab 05/17/16 1250  AST 47*  ALT 41  ALKPHOS 83  BILITOT 1.1  PROT 6.9  ALBUMIN 3.5   No results for input(s): LIPASE, AMYLASE in the last 168 hours. No results for input(s): AMMONIA in the last 168 hours.  CBC:  Recent Labs Lab 05/17/16 1250 05/17/16 1301 05/18/16 0439  WBC 6.3  --  8.0  NEUTROABS 4.6  --   --   HGB 16.8 19.4* 16.6  HCT 50.8 57.0* 50.8  MCV 92.2  --  92.9  PLT 156  --  148*    Cardiac Enzymes:  Recent Labs Lab 05/17/16 1250 05/17/16 2348 05/18/16 0439  TROPONINI 0.47* 25.87* 37.38*    BNP: BNP (last 3 results)  Recent Labs  05/17/16 1250  BNP 905.0*    ProBNP (last 3 results) No results for input(s): PROBNP in the last 8760 hours.   CBG: No results for input(s): GLUCAP in the last 168 hours.  Coagulation Studies:  Recent Labs  05/18/16 0439  LABPROT 16.1*  INR 1.28     Other results: EKG:05/17/16 SVT 170s, probably anteroseptal infarct, old.   Imaging: Dg Chest Portable 1 View  05/17/2016  CLINICAL DATA:  Shortness of breath worsening over the last several weeks. EXAM: PORTABLE CHEST 1 VIEW COMPARISON:  03/02/2015 FINDINGS: Opacity obscures the rib right hemidiaphragm and extends up to the right mid lung, obscuring the right heart border. Moderate to prominent enlargement of the cardiopericardial silhouette. The left IJ line has been removed. Left lung appears clear. Aerated part of the right upper lobe appears clear. IMPRESSION: 1. Worsening airspace opacity obscuring the right hemidiaphragm and right heart border. This could represent an enlarging pleural effusion with passive atelectasis but is nonspecific. The patient does have a history of a pleural effusion on the right. This could be further worked up with dedicated chest CT if clinically warranted. 2. Cardiomegaly without evidence of edema. Electronically Signed   By: Gaylyn Rong M.D.   On: 05/17/2016 13:12      Medications:     Current Medications: . aspirin      . enoxaparin (LOVENOX) injection  40 mg Subcutaneous Q24H  . furosemide  40 mg Intravenous BID  . sodium chloride flush  3 mL Intravenous Q12H     Infusions: . diltiazem (CARDIZEM) infusion 10 mg/hr (05/18/16 0850)      Assessment   1. Cardiogenic Shock 2 Acute on chronic systolic HF in the setting of Afib RVR 3. Afib with hx of tachycardia mediated CMP 4. AKI on CKD stage 3 5. Hx of rectus sheath hematoma on coumadin 2013 - refuses anticoagulation 6. HTN  Plan    Pt is in fulminant HF with cardiogenic shock and marked volume overload.   Start milrinone 0.25 now, if pressure tolerates increase to 0.375.  Have levophed ready.  Give 80 mg IV lasix now.   Start amio drip with 150 mg bolus now.   Will place central line.  Suspect pt may quickly progress  to need dialysis if no response to IV lasix and milrinone.    Breathing 30-40 times a minute.  Pt and wife have stated he is OK with intubation if necessary. May need BiPAP soon.   Length of Stay: 1  Graciella Freer PA-C 05/18/2016, 12:08 PM  Advanced Heart Failure Team Pager [phone] (M-F; 7a - 4p)  Please contact [facility] for night-coverage after hours (4p -7a ) and weekends on [email or web address]  Patient seen and examined with Otilio Saber, PA-C. We discussed all aspects of the encounter. I agree with the assessment and plan as stated above.   He has recurrent cardiogenic shock with multisystem organ failure in the setting of recurrent AF. He is critically ill. Will start on milrinone with norepinephrine as needed for BP support. Start IV amio and heparin. Will place central access and measure CVPs and co-ox. Needs aggressive diuresis and Kayexalate. We discussed the need for possible intubation and CVVHD. Both he and his wife agree to it if needed. His troponin is markedly elevated but no ST elevation on ECG and no CP. Will need cath if/when his condition and renal function improves. Treat with ASA and heparin. BP too low for b-blocker.   He confirms Full Code status.   The patient is critically ill with multiple organ systems failure and requires high complexity decision making for assessment and support, frequent evaluation and titration of therapies, application of advanced monitoring technologies and extensive interpretation of multiple databases.   Critical Care Time devoted to patient care services (not including procedures) described in this note is 45 Minutes.  Bensimhon, Daniel,MD 1:57 PM

## 2016-05-18 NOTE — Procedures (Signed)
Arterial Lije Insertion Procedure Note Edna Spears [id number] 01/29/1946  Procedure: Right radial arterial aatheter Indications: Frequent blood sampling  Procedure Details Consent: Risks of procedure as well as the alternatives and risks of each were explained to the (patient/caregiver).  Consent for procedure obtained. Time Out: Verified patient identification, verified procedure, site/side was marked, verified correct patient position, special equipment/implants available, medications/allergies/relevent history reviewed, required imaging and test results available.  Performed  Maximum sterile technique was used including antiseptics, cap, gloves, gown, hand hygiene, mask and sheet. Skin prep: Chlorhexidine; local anesthetic administered The right radial artery was cannulated with Doppler guidance but we were unable to feed the wire despite multiple attempts. The procedure was thus aborted.   Tiara Bartoli 05/18/2016, 2:00 PM

## 2016-05-18 NOTE — Progress Notes (Signed)
Patient has another elevated troponin 37.38 from 25.87. Patient still denies any pain. Patient still refuses any coagulation. Dr. Sherrie Mustache gave order for aspirin 325. Patient agrees to take it.

## 2016-05-18 NOTE — Procedures (Signed)
Central Venous Catheter Insertion Procedure Note Martin [name] [id number] 12-Jul-1946  Procedure: Insertion of Central Venous Catheter Indications: Assessment of intravascular volume  Operators: Lavaya Defreitas, [place]   Procedure Details Consent: Risks of procedure as well as the alternatives and risks of each were explained to the (patient/caregiver).  Consent for procedure obtained. Time Out: Verified patient identification, verified procedure, site/side was marked, verified correct patient position, special equipment/implants available, medications/allergies/relevent history reviewed, required imaging and test results available.  Performed  Maximum sterile technique was used including antiseptics, cap, gloves, gown, hand hygiene, mask and sheet. Skin prep: Chlorhexidine; local anesthetic administered A antimicrobial bonded/coated triple lumen catheter was placed in the right subclavian vein using the Seldinger technique.  Evaluation Blood flow good Complications: No apparent complications Patient did tolerate procedure well. Chest X-ray ordered to verify placement.  CXR: pending.  Martin Meres MD 05/18/2016, 1:58 PM

## 2016-05-18 NOTE — Progress Notes (Signed)
ANTICOAGULATION CONSULT NOTE - Initial Consult  Pharmacy Consult for Heparin Indication: atrial fibrillation  Allergies  Allergen Reactions  . Amiodarone Rash    Rash resolved when amio stopped 2013    Patient Measurements: Height: 5\' 5"  (165.1 cm) Weight: 211 lb 3.2 oz (95.8 kg) IBW/kg (Calculated) : 61.5 Heparin Dosing Weight: 85kg  Vital Signs: Temp: 97.4 F (36.3 C) (05/24 1115) Temp Source: Oral (05/24 1115) BP: 108/84 mmHg (05/24 1345) Pulse Rate: 125 (05/24 1345)  Labs:  Recent Labs  05/17/16 1250 05/17/16 1301 05/17/16 2348 05/18/16 0439 05/18/16 0910  HGB 16.8 19.4*  --  16.6  --   HCT 50.8 57.0*  --  50.8  --   PLT 156  --   --  148*  --   LABPROT  --   --   --  16.1*  --   INR  --   --   --  1.28  --   CREATININE 1.66* 1.70*  --  2.26* 2.56*  TROPONINI 0.47*  --  25.87* 37.38*  --     Estimated Creatinine Clearance: 29.8 mL/min (by C-G formula based on Cr of 2.56).   Medical History: Past Medical History  Diagnosis Date  . Essential hypertension, benign   . Atrial fibrillation (HCC)   . Cardiomyopathy (HCC)     Transient cardiomyopathy with EF 30-35% 03/27/12, improved to 55-60% 04/16/12  . Acute renal failure (HCC)     03/2012 (not on ACEI due to this) - renal US unremarkable  . Rectus sheath hematoma     Spontaneous during 03/2012 hospitalization managed conservatively with blood products  . Respiratory failure (HCC)     VDRF 03/2012 in setting of CHF  . Acute blood loss anemia     Due to rectus sheath hematoma 03/2012  . Unspecified sleep apnea     Suspected during 03/2012 hospitalization   . Hematuria 03/2012  . Hyperglycemia 03/2012  . Shock (HCC)     03/2012 hospitalization - Cardiogenic shock requiring pressors initially, followed by hemorrhagic shock later in hospitalization  . Pleural effusion 03/2012  . CHF (congestive heart failure) (HCC)       Assessment: 69yom with Hx Afib not on AC d/t rectus sheath hematoma 2013.  Stopped meds 3  weeks ago and now acute HF  decompensation and Afib RVR.  Hx EF 25%> improved 55% 2016.  Hx rash with po amio in 2013 but tolerated IV 2016.   Started amiodarone drip and heparin drip to start 4hr after CL placed.   CBC stable no bleeding   Goal of Therapy:  Heparin level 0.3-0.5 units/ml - given Hx bleeding  Monitor platelets by anticoagulation protocol: Yes   Plan:  Heparin small bolus 2000 uts IV x1 Heparin drip 1050 uts/hr Draw HL 6hr after start and daily  Leota Sauers Pharm.D. CPP, BCPS Clinical Pharmacist [phone] 05/18/2016 2:06 PM

## 2016-05-18 NOTE — Progress Notes (Signed)
Patient has elevated critical troponin value 25.87 from previous 0.13. Patient denies any pain. LDorcas Mcmurray was called and notified about it. Orders received. EKG was done and results were reported to Dr. Onalee Hua. Dr. Onalee Hua was also notified about patient's refusal of anticoagulation, in spite of education about benefits and risks of it. No new orders received. Continue to monitor the patient.

## 2016-05-18 NOTE — Progress Notes (Addendum)
Spoke with RN. Patient refusing anticoagulation.  No Lovenox at this time. RN to call MD on call.  If treatment dose Lovenox needed dose would be 1mg /kg.  Mady Gemma, [facility] 05/18/2016 1:03 AM

## 2016-05-18 NOTE — Progress Notes (Signed)
PROGRESS NOTE    Martin [name]  WUJ:[id number] DOB: 02-18-1946 DOA: 05/17/2016 PCP: No PCP Per Patient    Brief Narrative:  Patient is a 70 year old male with a history of chronic systolic congestive heart failure, atrial fibrillation CK D, and borderline diabetes.. Patient had been treated with Coumadin until 2013 when he was admitted for cardiogenic shock in the setting of atrial fibrillation and subsequent spontaneous severe rectus sheath hematoma while on Coumadin. Coumadin was discontinued in 2013. Patient presented on 05/17/2016 with shortness of breath. He had not taken his "heart medications" for approximately 1 week. He was found to have rapid atrial fibrillation and right pleural effusion suggestive of acute on chronic systolic heart failure.  Assessment & Plan:   Principal Problem:   Atrial fibrillation with RVR (HCC) Active Problems:   Acute on chronic systolic CHF (congestive heart failure) (HCC)   NSTEMI (non-ST elevated myocardial infarction) (HCC)   Pleural effusion, right   Secondary cardiomyopathy (HCC)   CKD (chronic kidney disease) stage 3, GFR 30-59 ml/min   Hyperkalemia   Thrombocytopenia (HCC)   1. Chronic atrial fib ablation with RVR. Patient presented with his heart rate in the 130s to the 160s. He was restarted on carvedilol and started on a Cardizem drip. He continues to refuse anticoagulation due to previous rectus sheath hematoma in 2013 on Coumadin. He has agreed to aspirin, so it was started on 05/18/16. His TSH was assessed and was within normal limits. -His heart rate has improved, but he is still tachycardic. We'll continue diltiazem drip for now and consider transitioning him over to oral diltiazem when clinically appropriate. Will also ask cardiology to opine. -We'll order 2-D echocardiogram. -Cardiology consulted.  Non-ST elevation myocardial infarction. Patient's troponin I was 0.47 on admission>> 0.13. It has increased to 37.38. His follow-up EKG  reveals no obvious ST elevations. Patient refused anticoagulation, but did agree to aspirin which was started 325 mg daily. His beta blocker carvedilol was restarted on admission. -Cardiology consulted for further management recommendations. -We'll order 2-D echocardiogram for further evaluation.  Acute on chronic systolic congestive heart failure with right pleural effusion and lower extremity edema. Patient had a previous EF of 20-25% 02/2015 which improved to 55-60% per echo 05/2015. On admission, his BNP was 905 and his chest x-ray showed enlarging pleural effusion on the right with atelectasis. He does have 2+ bilateral lower extremity pitting edema. -Patient was started on IV Lasix. His I/O is barely negative. Query accuracy. Maryclare Labrador continue current management with possibility of increasing IV Lasix. -2-D echocardiogram ordered and pending.  Stage III chronic kidney disease. Patient's creatinine was 1.66 on admission. His baseline creatinine appears to range from 1.5-1.55. (He did have a creatinine of 2.39-02/2015. -His creatinine has increased to 2.26 in the setting of diuresis. -We'll continue to diuresis with Lasix and will watch his renal function closely. Strict ins and outs ordered. -Consider renal ultrasound.  Hyperkalemia. Patient's serum potassium was within normal limits on admission. It has increased to 5.6. -We'll hold potassium and recheck his potassium level.  Thrombocytopenia. Patient's platelet count was within normal limits on admission, but has fallen slightly to 148. -We'll continue to monitor.    DVT prophylaxis: Lovenox Code Status: Full code Family Communication: Discussed with patient; family not available Disposition Plan: Discharged home when clinically appropriate   Consultants:   Cardiology  Procedures:   None  Antimicrobials:   None    Subjective: Patient is sitting on the bedside commode. He denies chest pain. He  does endorse some  shortness of breath. No cough.  Objective: Filed Vitals:   05/18/16 0400 05/18/16 0500 05/18/16 0505 05/18/16 0600  BP: 85/58  92/65 100/80  Pulse: 34   33  Temp: 96.3 F (35.7 C)     TempSrc: Oral     Resp: 32 22 21 30   Height:      Weight:  95.8 kg (211 lb 3.2 oz)    SpO2: 97%   99%    Intake/Output Summary (Last 24 hours) at 05/18/16 0809 Last data filed at 05/18/16 0400  Gross per 24 hour  Intake 475.83 ml  Output    700 ml  Net -224.17 ml   Filed Weights   05/17/16 1830 05/18/16 0500  Weight: 95.8 kg (211 lb 3.2 oz) 95.8 kg (211 lb 3.2 oz)    Examination:  General exam: 70 year old African-American man in no acute distress.  Respiratory system: Few crackles on the right with decreased breath sounds in the bases; otherwise clear. Breathing nonlabored. Cardiovascular system: Irregular, irregular, with borderline tachycardia; 2-3+ bilateral lower extremity pitting edema. Gastrointestinal system: Abdomen is nondistended, soft and nontender. No organomegaly or masses felt. Normal bowel sounds heard. Central nervous system: Alert and oriented. No focal neurological deficits. Extremities: 2-3+ bilateral lower extremity pitting edema. Skin: No rashes, lesions or ulcers Psychiatry: Judgement and insight appear normal. Mood & affect appropriate.     Data Reviewed: I have personally reviewed following labs and imaging studies  CBC:  Recent Labs Lab 05/17/16 1250 05/17/16 1301 05/18/16 0439  WBC 6.3  --  8.0  NEUTROABS 4.6  --   --   HGB 16.8 19.4* 16.6  HCT 50.8 57.0* 50.8  MCV 92.2  --  92.9  PLT 156  --  148*   Basic Metabolic Panel:  Recent Labs Lab 05/17/16 1250 05/17/16 1301 05/18/16 0439  NA 140 142 137  K 3.8 4.4 5.6*  CL 106 105 104  CO2 25  --  24  GLUCOSE 179* 171* 138*  BUN 31* 40* 35*  CREATININE 1.66* 1.70* 2.26*  CALCIUM 9.1  --  9.2   GFR: Estimated Creatinine Clearance: 33.7 mL/min (by C-G formula based on Cr of 2.26). Liver  Function Tests:  Recent Labs Lab 05/17/16 1250  AST 47*  ALT 41  ALKPHOS 83  BILITOT 1.1  PROT 6.9  ALBUMIN 3.5   No results for input(s): LIPASE, AMYLASE in the last 168 hours. No results for input(s): AMMONIA in the last 168 hours. Coagulation Profile:  Recent Labs Lab 05/18/16 0439  INR 1.28   Cardiac Enzymes:  Recent Labs Lab 05/17/16 1250 05/17/16 2348 05/18/16 0439  TROPONINI 0.47* 25.87* 37.38*   BNP (last 3 results) No results for input(s): PROBNP in the last 8760 hours. HbA1C: No results for input(s): HGBA1C in the last 72 hours. CBG: No results for input(s): GLUCAP in the last 168 hours. Lipid Profile: No results for input(s): CHOL, HDL, LDLCALC, TRIG, CHOLHDL, LDLDIRECT in the last 72 hours. Thyroid Function Tests:  Recent Labs  05/17/16 1900  TSH 1.677   Anemia Panel: No results for input(s): VITAMINB12, FOLATE, FERRITIN, TIBC, IRON, RETICCTPCT in the last 72 hours. Sepsis Labs: No results for input(s): PROCALCITON, LATICACIDVEN in the last 168 hours.  No results found for this or any previous visit (from the past 240 hour(s)).       Radiology Studies: Dg Chest Portable 1 View  05/17/2016  CLINICAL DATA:  Shortness of breath worsening over the last several  weeks. EXAM: PORTABLE CHEST 1 VIEW COMPARISON:  03/02/2015 FINDINGS: Opacity obscures the rib right hemidiaphragm and extends up to the right mid lung, obscuring the right heart border. Moderate to prominent enlargement of the cardiopericardial silhouette. The left IJ line has been removed. Left lung appears clear. Aerated part of the right upper lobe appears clear. IMPRESSION: 1. Worsening airspace opacity obscuring the right hemidiaphragm and right heart border. This could represent an enlarging pleural effusion with passive atelectasis but is nonspecific. The patient does have a history of a pleural effusion on the right. This could be further worked up with dedicated chest CT if clinically  warranted. 2. Cardiomegaly without evidence of edema. Electronically Signed   By: Gaylyn Rong M.D.   On: 05/17/2016 13:12        Scheduled Meds: . aspirin      . carvedilol  6.25 mg Oral BID WC  . enoxaparin (LOVENOX) injection  40 mg Subcutaneous Q24H  . furosemide  40 mg Intravenous BID  . potassium chloride  20 mEq Oral BID  . sodium chloride flush  3 mL Intravenous Q12H   Continuous Infusions: . diltiazem (CARDIZEM) infusion 2.5 mg/hr (05/18/16 0600)     LOS: 1 day    Time spent: 35 minutes    Elliot Cousin, MD [facility] Pager [phone]  If 7PM-7AM, please contact night-coverage [email or web address] Password [email or web address] 05/18/2016, 8:09 AM

## 2016-05-18 NOTE — Progress Notes (Signed)
CRITICAL VALUE ALERT  Critical value received: Troponin 25.87  Date of notification:  05/18/16  Time of notification:  0035  Critical value read back:Yes.    Nurse who received alert:  Henrene Pastor  MD notified (1st page):  L Hudak  Time of first page:  [phone]  MD notified (2nd page):Dr. Onalee Hua  Time of second page: [phone]  Responding MD:  Wyline Beady and Dr. Onalee Hua  Time MD responded:  [phone] and [phone]

## 2016-05-19 LAB — GLUCOSE, CAPILLARY
GLUCOSE-CAPILLARY: 198 mg/dL — AB (ref 65–99)
GLUCOSE-CAPILLARY: 118 mg/dL — AB (ref 65–99)
GLUCOSE-CAPILLARY: 151 mg/dL — AB (ref 65–99)
Glucose-Capillary: 133 mg/dL — ABNORMAL HIGH (ref 65–99)

## 2016-05-19 LAB — BASIC METABOLIC PANEL
Anion gap: 13 (ref 5–15)
Anion gap: 10 (ref 5–15)
BUN: 41 mg/dL — ABNORMAL HIGH (ref 6–20)
BUN: 44 mg/dL — AB (ref 6–20)
CALCIUM: 8.7 mg/dL — AB (ref 8.9–10.3)
CHLORIDE: 97 mmol/L — AB (ref 101–111)
CO2: 25 mmol/L (ref 22–32)
CO2: 27 mmol/L (ref 22–32)
CREATININE: 2.96 mg/dL — AB (ref 0.61–1.24)
CREATININE: 3.02 mg/dL — AB (ref 0.61–1.24)
Calcium: 8.8 mg/dL — ABNORMAL LOW (ref 8.9–10.3)
Chloride: 97 mmol/L — ABNORMAL LOW (ref 101–111)
GFR calc non Af Amer: 20 mL/min — ABNORMAL LOW (ref >60)
GFR, EST AFRICAN AMERICAN: 24 mL/min — AB (ref >60)
GFR, EST AFRICAN AMERICAN: 23 mL/min — AB (ref >60)
GFR, EST NON AFRICAN AMERICAN: 20 mL/min — AB (ref >60)
GLUCOSE: 218 mg/dL — AB (ref 65–99)
Glucose, Bld: 129 mg/dL — ABNORMAL HIGH (ref 65–99)
Potassium: 3.9 mmol/L (ref 3.5–5.1)
Potassium: 4.2 mmol/L (ref 3.5–5.1)
SODIUM: 134 mmol/L — AB (ref 135–145)
Sodium: 135 mmol/L (ref 135–145)

## 2016-05-19 LAB — HEPATIC FUNCTION PANEL
ALK PHOS: 77 U/L (ref 38–126)
ALT: 76 U/L — AB (ref 17–63)
AST: 101 U/L — AB (ref 15–41)
Albumin: 2.9 g/dL — ABNORMAL LOW (ref 3.5–5.0)
Bilirubin, Direct: 0.4 mg/dL (ref 0.1–0.5)
Indirect Bilirubin: 0.7 mg/dL (ref 0.3–0.9)
TOTAL PROTEIN: 5.7 g/dL — AB (ref 6.5–8.1)
Total Bilirubin: 1.1 mg/dL (ref 0.3–1.2)

## 2016-05-19 LAB — CBC
HCT: 42.7 % (ref 39.0–52.0)
Hemoglobin: 14 g/dL (ref 13.0–17.0)
MCH: 29.2 pg (ref 26.0–34.0)
MCHC: 32.8 g/dL (ref 30.0–36.0)
MCV: 89 fL (ref 78.0–100.0)
PLATELETS: 103 10*3/uL — AB (ref 150–400)
RBC: 4.8 MIL/uL (ref 4.22–5.81)
RDW: 14.9 % (ref 11.5–15.5)
WBC: 8.4 10*3/uL (ref 4.0–10.5)

## 2016-05-19 LAB — MAGNESIUM: Magnesium: 1.8 mg/dL (ref 1.7–2.4)

## 2016-05-19 LAB — CARBOXYHEMOGLOBIN
CARBOXYHEMOGLOBIN: 1 % (ref 0.5–1.5)
METHEMOGLOBIN: 0.8 % (ref 0.0–1.5)
O2 SAT: 66.5 %
Total hemoglobin: 14.7 g/dL (ref 13.5–18.0)

## 2016-05-19 LAB — TROPONIN I: Troponin I: 23.43 ng/mL (ref <0.031)

## 2016-05-19 LAB — HEPARIN LEVEL (UNFRACTIONATED): HEPARIN UNFRACTIONATED: 0.44 [IU]/mL (ref 0.30–0.70)

## 2016-05-19 MED ORDER — MAGNESIUM SULFATE 2 GM/50ML IV SOLN
2.0000 g | Freq: Once | INTRAVENOUS | Status: AC
  Administered 2016-05-19: 2 g via INTRAVENOUS
  Filled 2016-05-19: qty 50

## 2016-05-19 MED ORDER — POTASSIUM CHLORIDE CRYS ER 20 MEQ PO TBCR
40.0000 meq | EXTENDED_RELEASE_TABLET | Freq: Once | ORAL | Status: AC
  Administered 2016-05-19: 40 meq via ORAL
  Filled 2016-05-19: qty 2

## 2016-05-19 MED ORDER — AMIODARONE LOAD VIA INFUSION: 150.0000 mg | Freq: Once | INTRAVENOUS | Status: DC

## 2016-05-19 MED ORDER — AMIODARONE LOAD VIA INFUSION
150.0000 mg | Freq: Once | INTRAVENOUS | Status: AC
  Administered 2016-05-19: 150 mg via INTRAVENOUS

## 2016-05-19 MED ORDER — METOLAZONE 5 MG PO TABS
5.0000 mg | ORAL_TABLET | Freq: Two times a day (BID) | ORAL | Status: DC
  Administered 2016-05-19 – 2016-05-22 (×8): 5 mg via ORAL
  Filled 2016-05-19 (×8): qty 1

## 2016-05-19 MED ORDER — INSULIN ASPART 100 UNIT/ML ~~LOC~~ SOLN: 0.0000 [IU] | Freq: Every day | SUBCUTANEOUS | Status: DC

## 2016-05-19 MED ORDER — HEPARIN (PORCINE) IN NACL 100-0.45 UNIT/ML-% IJ SOLN
900.0000 [IU]/h | INTRAMUSCULAR | Status: DC
  Administered 2016-05-19: 1050 [IU]/h via INTRAVENOUS
  Administered 2016-05-20: 900 [IU]/h via INTRAVENOUS
  Filled 2016-05-19 (×2): qty 250

## 2016-05-19 MED ORDER — HEPARIN BOLUS VIA INFUSION
2000.0000 [IU] | Freq: Once | INTRAVENOUS | Status: AC
  Administered 2016-05-19: 2000 [IU] via INTRAVENOUS
  Filled 2016-05-19: qty 2000

## 2016-05-19 MED ORDER — INSULIN ASPART 100 UNIT/ML ~~LOC~~ SOLN
0.0000 [IU] | Freq: Three times a day (TID) | SUBCUTANEOUS | Status: DC
  Administered 2016-05-19: 2 [IU] via SUBCUTANEOUS
  Administered 2016-05-20 – 2016-05-21 (×5): 1 [IU] via SUBCUTANEOUS
  Administered 2016-05-22 (×2): 2 [IU] via SUBCUTANEOUS
  Administered 2016-05-22 – 2016-05-23 (×2): 1 [IU] via SUBCUTANEOUS
  Administered 2016-05-23: 2 [IU] via SUBCUTANEOUS
  Administered 2016-05-23 – 2016-05-24 (×2): 1 [IU] via SUBCUTANEOUS
  Administered 2016-05-24 – 2016-05-25 (×2): 2 [IU] via SUBCUTANEOUS

## 2016-05-19 MED ORDER — AMIODARONE LOAD VIA INFUSION
300.0000 mg | Freq: Once | INTRAVENOUS | Status: AC
  Administered 2016-05-19: 300 mg via INTRAVENOUS
  Filled 2016-05-19: qty 166.67

## 2016-05-19 NOTE — Progress Notes (Signed)
CRITICAL VALUE ALERT  Critical value received:  Troponin 23.43  Date of notification:  05/19/16  Time of notification:  0634  Critical value read back:yes  Nurse who received alert:  Marshia Ly  MD notified (1st page):  Katrinka Blazing  Time of first page:  [phone]

## 2016-05-19 NOTE — Progress Notes (Signed)
ANTICOAGULATION CONSULT NOTE - Follow Up Consult  Pharmacy Consult for Heparin Indication: atrial fibrillation  Allergies  Allergen Reactions  . Amiodarone Rash    Rash resolved when amio stopped 2013    Patient Measurements: Height: 5\' 5"  (165.1 cm) Weight: 214 lb 8.1 oz (97.3 kg) IBW/kg (Calculated) : 61.5 Heparin Dosing Weight: 85kg  Vital Signs: Temp: 98.6 F (37 C) (05/25 0800) Temp Source: Oral (05/25 0800) BP: 111/100 mmHg (05/25 0800) Pulse Rate: 139 (05/25 0800)  Labs:  Recent Labs  05/17/16 1250 05/17/16 1301 05/17/16 2348 05/18/16 0439 05/18/16 0910 05/18/16 1440 05/19/16 0445  HGB 16.8 19.4*  --  16.6  --   --  14.0  HCT 50.8 57.0*  --  50.8  --   --  42.7  PLT 156  --   --  148*  --   --  103*  LABPROT  --   --   --  16.1*  --   --   --   INR  --   --   --  1.28  --   --   --   CREATININE 1.66* 1.70*  --  2.26* 2.56* 3.20* 2.96*  TROPONINI 0.47*  --  25.87* 37.38*  --   --  23.43*    Estimated Creatinine Clearance: 26 mL/min (by C-G formula based on Cr of 2.96).   Medical History: Past Medical History  Diagnosis Date  . Essential hypertension, benign   . Atrial fibrillation (HCC)   . Cardiomyopathy (HCC)     Transient cardiomyopathy with EF 30-35% 03/27/12, improved to 55-60% 04/16/12  . Acute renal failure (HCC)     03/2012 (not on ACEI due to this) - renal US unremarkable  . Rectus sheath hematoma     Spontaneous during 03/2012 hospitalization managed conservatively with blood products  . Respiratory failure (HCC)     VDRF 03/2012 in setting of CHF  . Acute blood loss anemia     Due to rectus sheath hematoma 03/2012  . Unspecified sleep apnea     Suspected during 03/2012 hospitalization   . Hematuria 03/2012  . Hyperglycemia 03/2012  . Shock (HCC)     03/2012 hospitalization - Cardiogenic shock requiring pressors initially, followed by hemorrhagic shock later in hospitalization  . Pleural effusion 03/2012  . CHF (congestive heart failure) (HCC)        Assessment: 69yom with Hx Afib not on AC d/t rectus sheath hematoma 2013.  Stopped meds 3 weeks ago and now acute HF  decompensation and Afib RVR.  Hx EF 25%> improved 55% 2016.  Hx rash with po amio in 2013 but tolerated IV 2016.   Started amiodarone drip and heparin drip to start 4hr after CL placed.   CBC stable no bleeding  Hematuria last pm so heparin held.  H/H stable hematuria improving - start heparin today  Goal of Therapy:  Heparin level 0.3-0.5 units/ml - given Hx bleeding  Monitor platelets by anticoagulation protocol: Yes   Plan:  Heparin small bolus 2000 uts IV x1 Heparin drip 1050 uts/hr Draw HL 6hr after start and daily  Leota Sauers Pharm.D. CPP, BCPS Clinical Pharmacist [phone] 05/19/2016 9:14 AM

## 2016-05-19 NOTE — Progress Notes (Addendum)
Advanced Heart Failure Rounding Note  Referring Physician: Dr Diona Browner Primary Cardiologist: Dr. Gala Romney   Subjective:    Presented 05/18/16 after transfer from [facility] with cardiogenic shock. Placed on milrinone, norepi, and amio drip for afib.    Coox 66.5% on milrinone 0.375. CVP 20-21  Feels better this am. Less orthopneic. Wife in room.   Weight up another 3 lbs.    Objective:   Weight Range: 214 lb 8.1 oz (97.3 kg) Body mass index is 35.7 kg/(m^2).   Vital Signs:   Temp:  [97.4 F (36.3 C)-98.1 F (36.7 C)] 98.1 F (36.7 C) (05/25 0400) Pulse Rate:  [25-137] 137 (05/25 0630) Resp:  [15-38] 24 (05/25 0630) BP: (52-142)/(23-128) 140/115 mmHg (05/25 0630) SpO2:  [7 %-100 %] 98 % (05/25 0630) Weight:  [214 lb 8.1 oz (97.3 kg)] 214 lb 8.1 oz (97.3 kg) (05/25 0500) Last BM Date: 05/17/16  Weight change: Filed Weights   05/17/16 1830 05/18/16 0500 05/19/16 0500  Weight: 211 lb 3.2 oz (95.8 kg) 211 lb 3.2 oz (95.8 kg) 214 lb 8.1 oz (97.3 kg)    Intake/Output:   Intake/Output Summary (Last 24 hours) at 05/19/16 0744 Last data filed at 05/19/16 0600  Gross per 24 hour  Intake 2259.19 ml  Output    710 ml  Net 1549.19 ml     Physical Exam: CVP 20-21 General: Chronically ill and fatigued appearing. Wife present.  HEENT: Pale, 02 via [place] Neck: supple. JVP to ear. Carotids 2+ bilat; no bruits. No lymphadenopathy or thyromegaly appreciated. Cor: Irregular rate, tachy. +S3.  Lungs: Diminished throughout, slight crackles in bases Abdomen: soft, NT, mild/mod distended no HSM. No bruits or masses. +BS  Extremities: no cyanosis, clubbing, rash. 3-4+ edema into thighs. Extremely cold to the touch down at ankle, but slightly improved in calves and thighs Neuro: alert & orientedx3, cranial nerves grossly intact. moves all 4 extremities w/o difficulty. Affect flat  Telemetry: Afib RVR 130-140s Labs: CBC  Recent Labs  05/17/16 1250  05/18/16 0439 05/19/16 0445    WBC 6.3  --  8.0 8.4  NEUTROABS 4.6  --   --   --   HGB 16.8  < > 16.6 14.0  HCT 50.8  < > 50.8 42.7  MCV 92.2  --  92.9 89.0  PLT 156  --  148* 103*  < > = values in this interval not displayed. Basic Metabolic Panel  Recent Labs  05/18/16 1440 05/19/16 0445  NA 135 135  K 5.8* 3.9  CL 102 97*  CO2 23 25  GLUCOSE 211* 218*  BUN 38* 41*  CREATININE 3.20* 2.96*  CALCIUM 9.2 8.8*  MG 2.1 1.8   Liver Function Tests  Recent Labs  05/17/16 1250 05/19/16 0500  AST 47* 101*  ALT 41 76*  ALKPHOS 83 77  BILITOT 1.1 1.1  PROT 6.9 5.7*  ALBUMIN 3.5 2.9*   No results for input(s): LIPASE, AMYLASE in the last 72 hours. Cardiac Enzymes  Recent Labs  05/17/16 2348 05/18/16 0439 05/19/16 0445  TROPONINI 25.87* 37.38* 23.43*    BNP: BNP (last 3 results)  Recent Labs  05/17/16 1250  BNP 905.0*    ProBNP (last 3 results) No results for input(s): PROBNP in the last 8760 hours.   D-Dimer No results for input(s): DDIMER in the last 72 hours. Hemoglobin A1C No results for input(s): HGBA1C in the last 72 hours. Fasting Lipid Panel No results for input(s): CHOL, HDL, LDLCALC, TRIG, CHOLHDL, LDLDIRECT  in the last 72 hours. Thyroid Function Tests  Recent Labs  05/17/16 1900  TSH 1.677    Other results:     Imaging/Studies:  Dg Chest Port 1 View  05/18/2016  CLINICAL DATA:  Encounter for is central line placement EXAM: PORTABLE CHEST 1 VIEW COMPARISON:  May 17, 2016 FINDINGS: Right central venous line is identified with distal tip in superior vena cava. There is no pleural line to suggest pneumothorax. There is opacity of the right mid and lung base unchanged. The left lung is clear. The heart size is enlarged. The aorta is tortuous. Bony structures are stable. IMPRESSION: Right central venous line distal tip in the superior vena cava. There is no pneumothorax. Persistent consolidation of the right mid and lung base unchanged. Electronically Signed   By:  Sherian Rein M.D.   On: 05/18/2016 16:09   Dg Chest Portable 1 View  05/17/2016  CLINICAL DATA:  Shortness of breath worsening over the last several weeks. EXAM: PORTABLE CHEST 1 VIEW COMPARISON:  03/02/2015 FINDINGS: Opacity obscures the rib right hemidiaphragm and extends up to the right mid lung, obscuring the right heart border. Moderate to prominent enlargement of the cardiopericardial silhouette. The left IJ line has been removed. Left lung appears clear. Aerated part of the right upper lobe appears clear. IMPRESSION: 1. Worsening airspace opacity obscuring the right hemidiaphragm and right heart border. This could represent an enlarging pleural effusion with passive atelectasis but is nonspecific. The patient does have a history of a pleural effusion on the right. This could be further worked up with dedicated chest CT if clinically warranted. 2. Cardiomegaly without evidence of edema. Electronically Signed   By: Gaylyn Rong M.D.   On: 05/17/2016 13:12     Latest Echo  Latest Cath   Medications:     Scheduled Medications: . antiseptic oral rinse  7 mL Mouth Rinse BID  . aspirin EC  81 mg Oral Daily  . sodium chloride flush  10-40 mL Intracatheter Q12H  . sodium chloride flush  3 mL Intravenous Q12H     Infusions: . amiodarone 30 mg/hr (05/19/16 0500)  . furosemide (LASIX) infusion 20 mg/hr (05/19/16 0504)  . milrinone 0.375 mcg/kg/min (05/19/16 0437)  . norepinephrine (LEVOPHED) Adult infusion Stopped (05/18/16 2040)     PRN Medications:  sodium chloride, acetaminophen, ondansetron (ZOFRAN) IV, sodium chloride flush, sodium chloride flush   Assessment   1. Cardiogenic Shock 2 Acute on chronic systolic HF in the setting of Afib RVR 3. Afib with hx of tachycardia mediated CMP 4. AKI on CKD stage 3 5. Hx of rectus sheath hematoma on coumadin 2013 - refuses anticoagulation 6. HTN 7. Hyperkalemia   Plan    K improved with Kayexalate. Had some UO overnight.   Will need to continue to watch closely for possibly dialysis, although so far Creatinine slightly improved.   LFTS up, likely with shock. Should trend down with better perfusion. Continue to follow.   May be poor DCCV with intolerance/refusal of anticoag. He did convert yesterday on high dose amio, but remains in afib currently despite several re-boluses. Will discuss optimal dosing of amio with MD.  Would need TEE if DCCV considered.   He will need cath once his condition and renal function improves.  If they do not, may have to re-visit Code status with family.  Currently pt and family confirm he wishes to remain full code.   Pt and family understand he is critically ill. Prognosis guarded at this  time.   Length of Stay: 2  Graciella Freer PA-C 05/19/2016, 7:44 AM  Advanced Heart Failure Team Pager [phone] (M-F; 7a - 4p)  Please contact [facility] for night-coverage after hours (4p -7a ) and weekends on [email or web address]  Patient seen and examined with Otilio Saber, PA-C. We discussed all aspects of the encounter. I agree with the assessment and plan as stated above.   In/out of AF with RVR. Now back with RVR at 150. Got multiple rounds amio. Remains on milrinone. Off norepi. Continue IV amiodarone. Will restart heparin. NPO after MN for possible TEE/DC-CV as needed.   CVP very high. Starting to make urine. Creatinine coming down. Hyperkalemia improved. Urine clearing.   Needs more diuresis. Add metolazone.   Troponin coming down. Will need cath, if/when renal function recovers sufficiently.  The patient is critically ill with multiple organ systems failure and requires high complexity decision making for assessment and support, frequent evaluation and titration of therapies, application of advanced monitoring technologies and extensive interpretation of multiple databases.   Critical Care Time devoted to patient care services described in this note is 35 Minutes.  Terrell Ostrand,  Rebie Peale,MD 9:17 AM

## 2016-05-19 NOTE — Progress Notes (Signed)
ANTICOAGULATION CONSULT NOTE - Follow Up Consult  Pharmacy Consult for Heparin Indication: atrial fibrillation  Allergies  Allergen Reactions  . Amiodarone Rash    Rash resolved when amio stopped 2013    Patient Measurements: Height: 5\' 5"  (165.1 cm) Weight: 214 lb 8.1 oz (97.3 kg) IBW/kg (Calculated) : 61.5 Heparin Dosing Weight: 85kg  Vital Signs: Temp: 98.6 F (37 C) (05/25 0800) Temp Source: Oral (05/25 0800) BP: 108/79 mmHg (05/25 1200) Pulse Rate: 33 (05/25 1200)  Labs:  Recent Labs  05/17/16 1250 05/17/16 1301 05/17/16 2348 05/18/16 0439 05/18/16 0910 05/18/16 1440 05/19/16 0445 05/19/16 1540  HGB 16.8 19.4*  --  16.6  --   --  14.0  --   HCT 50.8 57.0*  --  50.8  --   --  42.7  --   PLT 156  --   --  148*  --   --  103*  --   LABPROT  --   --   --  16.1*  --   --   --   --   INR  --   --   --  1.28  --   --   --   --   HEPARINUNFRC  --   --   --   --   --   --   --  0.44  CREATININE 1.66* 1.70*  --  2.26* 2.56* 3.20* 2.96*  --   TROPONINI 0.47*  --  25.87* 37.38*  --   --  23.43*  --     Estimated Creatinine Clearance: 26 mL/min (by C-G formula based on Cr of 2.96).   Medical History: Past Medical History  Diagnosis Date  . Essential hypertension, benign   . Atrial fibrillation (HCC)   . Cardiomyopathy (HCC)     Transient cardiomyopathy with EF 30-35% 03/27/12, improved to 55-60% 04/16/12  . Acute renal failure (HCC)     03/2012 (not on ACEI due to this) - renal US unremarkable  . Rectus sheath hematoma     Spontaneous during 03/2012 hospitalization managed conservatively with blood products  . Respiratory failure (HCC)     VDRF 03/2012 in setting of CHF  . Acute blood loss anemia     Due to rectus sheath hematoma 03/2012  . Unspecified sleep apnea     Suspected during 03/2012 hospitalization   . Hematuria 03/2012  . Hyperglycemia 03/2012  . Shock (HCC)     03/2012 hospitalization - Cardiogenic shock requiring pressors initially, followed by  hemorrhagic shock later in hospitalization  . Pleural effusion 03/2012  . CHF (congestive heart failure) (HCC)       Assessment: 69yom with Hx Afib not on AC d/t rectus sheath hematoma 2013.  Stopped meds 3 weeks ago and now acute HF  decompensation and Afib RVR.  Hx EF 25%> improved 55% 2016.  Hematuria last pm so heparin held.  H/H stable hematuria improving - restarted heparin today. Initial HL is therapeutic at 0.44. RN says no new s/s of bleeding  Goal of Therapy:  Heparin level 0.3-0.5 units/ml - given Hx bleeding  Monitor platelets by anticoagulation protocol: Yes   Plan:  Continue heparin drip at 1050 uts/hr F/u 8 hr confirmatory HL  Monitor daily HL, CBC and s/s of bleeding   Vinnie Level, PharmD., BCPS Clinical Pharmacist Pager [phone]

## 2016-05-20 ENCOUNTER — Inpatient Hospital Stay (HOSPITAL_COMMUNITY): Payer: Medicare Other | Admitting: Certified Registered"

## 2016-05-20 ENCOUNTER — Inpatient Hospital Stay (HOSPITAL_COMMUNITY): Payer: Medicare Other

## 2016-05-20 ENCOUNTER — Encounter (HOSPITAL_COMMUNITY): Payer: Self-pay | Admitting: Certified Registered"

## 2016-05-20 ENCOUNTER — Encounter (HOSPITAL_COMMUNITY)
Admission: EM | Disposition: A | Discharge status: Home / Self Care | Payer: Self-pay | Source: Home / Self Care | Attending: Internal Medicine

## 2016-05-20 HISTORY — PX: CARDIOVERSION: SHX1299

## 2016-05-20 HISTORY — PX: TEE WITHOUT CARDIOVERSION: SHX5443

## 2016-05-20 LAB — COMPREHENSIVE METABOLIC PANEL
ALBUMIN: 2.9 g/dL — AB (ref 3.5–5.0)
ALK PHOS: 73 U/L (ref 38–126)
ALT: 58 U/L (ref 17–63)
ANION GAP: 12 (ref 5–15)
AST: 57 U/L — AB (ref 15–41)
BILIRUBIN TOTAL: 1 mg/dL (ref 0.3–1.2)
BUN: 44 mg/dL — AB (ref 6–20)
CALCIUM: 8.7 mg/dL — AB (ref 8.9–10.3)
CO2: 30 mmol/L (ref 22–32)
CREATININE: 2.84 mg/dL — AB (ref 0.61–1.24)
Chloride: 92 mmol/L — ABNORMAL LOW (ref 101–111)
GFR calc Af Amer: 25 mL/min — ABNORMAL LOW (ref >60)
GFR calc non Af Amer: 21 mL/min — ABNORMAL LOW (ref >60)
GLUCOSE: 141 mg/dL — AB (ref 65–99)
Potassium: 3.2 mmol/L — ABNORMAL LOW (ref 3.5–5.1)
Sodium: 134 mmol/L — ABNORMAL LOW (ref 135–145)
TOTAL PROTEIN: 5.7 g/dL — AB (ref 6.5–8.1)

## 2016-05-20 LAB — GLUCOSE, CAPILLARY
GLUCOSE-CAPILLARY: 123 mg/dL — AB (ref 65–99)
GLUCOSE-CAPILLARY: 148 mg/dL — AB (ref 65–99)
GLUCOSE-CAPILLARY: 148 mg/dL — AB (ref 65–99)
Glucose-Capillary: 141 mg/dL — ABNORMAL HIGH (ref 65–99)

## 2016-05-20 LAB — CBC
HCT: 40.5 % (ref 39.0–52.0)
HEMOGLOBIN: 13.5 g/dL (ref 13.0–17.0)
MCH: 29.3 pg (ref 26.0–34.0)
MCHC: 33.3 g/dL (ref 30.0–36.0)
MCV: 88 fL (ref 78.0–100.0)
Platelets: 97 10*3/uL — ABNORMAL LOW (ref 150–400)
RBC: 4.6 MIL/uL (ref 4.22–5.81)
RDW: 14.8 % (ref 11.5–15.5)
WBC: 9 10*3/uL (ref 4.0–10.5)

## 2016-05-20 LAB — CARBOXYHEMOGLOBIN
CARBOXYHEMOGLOBIN: 0.9 % (ref 0.5–1.5)
Methemoglobin: 0.8 % (ref 0.0–1.5)
O2 Saturation: 72.2 %
Total hemoglobin: 14 g/dL (ref 13.5–18.0)

## 2016-05-20 LAB — HEPARIN LEVEL (UNFRACTIONATED)
HEPARIN UNFRACTIONATED: 0.33 [IU]/mL (ref 0.30–0.70)
Heparin Unfractionated: 0.85 IU/mL — ABNORMAL HIGH (ref 0.30–0.70)
Heparin Unfractionated: 0.59 IU/mL (ref 0.30–0.70)

## 2016-05-20 LAB — HEMOGLOBIN A1C
Hgb A1c MFr Bld: 7.5 % — ABNORMAL HIGH (ref 4.8–5.6)
Mean Plasma Glucose: 169 mg/dL

## 2016-05-20 SURGERY — CARDIOVERSION
Anesthesia: Monitor Anesthesia Care

## 2016-05-20 MED ORDER — POTASSIUM CHLORIDE CRYS ER 20 MEQ PO TBCR
40.0000 meq | EXTENDED_RELEASE_TABLET | Freq: Once | ORAL | Status: AC
  Administered 2016-05-20: 40 meq via ORAL
  Filled 2016-05-20: qty 2

## 2016-05-20 MED ORDER — SODIUM CHLORIDE 0.9 % IV SOLN: 250.0000 mL | INTRAVENOUS | Status: DC

## 2016-05-20 MED ORDER — LIDOCAINE 2% (20 MG/ML) 5 ML SYRINGE
INTRAMUSCULAR | Status: AC
  Filled 2016-05-20: qty 5

## 2016-05-20 MED ORDER — ETOMIDATE 2 MG/ML IV SOLN
INTRAVENOUS | Status: AC
  Filled 2016-05-20: qty 10

## 2016-05-20 MED ORDER — SODIUM CHLORIDE 0.9 % IV SOLN
INTRAVENOUS | Status: DC | PRN
  Administered 2016-05-20: 09:00:00 via INTRAVENOUS

## 2016-05-20 MED ORDER — PROPOFOL 10 MG/ML IV BOLUS
INTRAVENOUS | Status: AC
  Filled 2016-05-20: qty 20

## 2016-05-20 MED ORDER — RANOLAZINE ER 500 MG PO TB12
500.0000 mg | ORAL_TABLET | Freq: Two times a day (BID) | ORAL | Status: DC
  Administered 2016-05-20 – 2016-05-27 (×14): 500 mg via ORAL
  Filled 2016-05-20 (×14): qty 1

## 2016-05-20 MED ORDER — SODIUM CHLORIDE 0.9% FLUSH: 3.0000 mL | INTRAVENOUS | Status: DC | PRN

## 2016-05-20 MED ORDER — SODIUM CHLORIDE 0.9% FLUSH
3.0000 mL | Freq: Two times a day (BID) | INTRAVENOUS | Status: DC
  Administered 2016-05-20 – 2016-05-22 (×5): 3 mL via INTRAVENOUS

## 2016-05-20 MED ORDER — ETOMIDATE 2 MG/ML IV SOLN
INTRAVENOUS | Status: DC | PRN
  Administered 2016-05-20: 2 mg via INTRAVENOUS
  Administered 2016-05-20: 8 mg via INTRAVENOUS
  Administered 2016-05-20: 2 mg via INTRAVENOUS

## 2016-05-20 MED ORDER — HEPARIN (PORCINE) IN NACL 100-0.45 UNIT/ML-% IJ SOLN
950.0000 [IU]/h | INTRAMUSCULAR | Status: DC
Start: 2016-05-20 — End: 2016-05-23
  Administered 2016-05-21: 800 [IU]/h via INTRAVENOUS
  Administered 2016-05-23: 950 [IU]/h via INTRAVENOUS
  Filled 2016-05-20 (×3): qty 250

## 2016-05-20 NOTE — Progress Notes (Signed)
ANTICOAGULATION CONSULT NOTE - Follow Up Consult  Pharmacy Consult for Heparin Indication: atrial fibrillation  Allergies  Allergen Reactions  . Amiodarone Rash    Rash resolved when amio stopped 2013    Patient Measurements: Height: 5\' 5"  (165.1 cm) Weight: 211 lb 13.8 oz (96.1 kg) IBW/kg (Calculated) : 61.5 Heparin Dosing Weight: 85kg  Vital Signs: Temp: 98.5 F (36.9 C) (05/26 1200) Temp Source: Oral (05/26 1200) BP: 107/95 mmHg (05/26 1300) Pulse Rate: 120 (05/26 1300)  Labs:  Recent Labs  05/17/16 2348  05/18/16 0439  05/19/16 0445 05/19/16 1540 05/19/16 1630 05/20/16 0400 05/20/16 1313  HGB  --   < > 16.6  --  14.0  --   --  13.5  --   HCT  --   --  50.8  --  42.7  --   --  40.5  --   PLT  --   --  148*  --  103*  --   --  97*  --   LABPROT  --   --  16.1*  --   --   --   --   --   --   INR  --   --  1.28  --   --   --   --   --   --   HEPARINUNFRC  --   --   --   --   --  0.44  --  0.85* 0.59  CREATININE  --   --  2.26*  < > 2.96*  --  3.02* 2.84*  --   TROPONINI 25.87*  --  37.38*  --  23.43*  --   --   --   --   < > = values in this interval not displayed.  Estimated Creatinine Clearance: 26.9 mL/min (by C-G formula based on Cr of 2.84).   Medical History: Past Medical History  Diagnosis Date  . Essential hypertension, benign   . Atrial fibrillation (HCC)   . Cardiomyopathy (HCC)     Transient cardiomyopathy with EF 30-35% 03/27/12, improved to 55-60% 04/16/12  . Acute renal failure (HCC)     03/2012 (not on ACEI due to this) - renal US unremarkable  . Rectus sheath hematoma     Spontaneous during 03/2012 hospitalization managed conservatively with blood products  . Respiratory failure (HCC)     VDRF 03/2012 in setting of CHF  . Acute blood loss anemia     Due to rectus sheath hematoma 03/2012  . Unspecified sleep apnea     Suspected during 03/2012 hospitalization   . Hematuria 03/2012  . Hyperglycemia 03/2012  . Shock (HCC)     03/2012  hospitalization - Cardiogenic shock requiring pressors initially, followed by hemorrhagic shock later in hospitalization  . Pleural effusion 03/2012  . CHF (congestive heart failure) (HCC)       Assessment: 69yom with Hx Afib not on AC d/t rectus sheath hematoma 2013 and resistant to restart now.  Stopped meds 3 weeks ago and now acute HF decompensation and Afib RVR.  Hx EF 25%> improved 55% 2016. Pharmacy consulted to dose heparin gtt. Hematuria on 5/24 so heparin held, improved on 5/25 so restarted. H/H stable and hematuria continues to improve.  This morning's HL was supratherapeutic at 0.85 on heparin 1050 units/hr. HL has trended down but still slightly above lower goal at 0.59 after rate decrease to 900 units/hr. Now s/p failed DCCV this am. F/u plans for transition to oral AC.  Goal of Therapy:  Heparin level 0.3-0.5 units/ml - given h/o bleeding  Monitor platelets by anticoagulation protocol: Yes   Plan:  Decrease heparin to 850 units/hr F/u 8h HL  Monitor daily HL, CBC and s/s of bleeding   Parnika Tweten K. Bonnye Fava, PharmD, BCPS, CPP Clinical Pharmacist Pager: [phone] Phone: [phone] 05/20/2016 2:25 PM

## 2016-05-20 NOTE — Progress Notes (Signed)
  Echocardiogram 2D Echocardiogram has been performed.  Martin [name] 05/20/2016, 9:24 AM

## 2016-05-20 NOTE — Care Management Important Message (Signed)
Important Message  Patient Details  Name: Martin [name] MRN: [id number] Date of Birth: 02/26/1946   Medicare Important Message Given:  Yes    [name], RN 05/20/2016, 2:35 PM

## 2016-05-20 NOTE — Anesthesia Postprocedure Evaluation (Signed)
Anesthesia Post Note  Patient: Martin [name]  Procedure(s) Performed: Procedure(s) (LRB): CARDIOVERSION (N/A) TRANSESOPHAGEAL ECHOCARDIOGRAM (TEE) (N/A)  Patient location during evaluation: PACU Anesthesia Type: MAC Level of consciousness: awake and alert Pain management: pain level controlled Vital Signs Assessment: post-procedure vital signs reviewed and stable Respiratory status: spontaneous breathing, nonlabored ventilation, respiratory function stable and patient connected to nasal cannula oxygen Cardiovascular status: stable and blood pressure returned to baseline Anesthetic complications: no    Last Vitals:  Filed Vitals:   05/20/16 0852 05/20/16 0853  BP:    Pulse: 143 143  Temp:    Resp: 22 20    Last Pain:  Filed Vitals:   05/20/16 0836  PainSc: [name]

## 2016-05-20 NOTE — Progress Notes (Signed)
ANTICOAGULATION CONSULT NOTE - Follow Up Consult  Pharmacy Consult for Heparin Indication: atrial fibrillation  Allergies  Allergen Reactions  . Amiodarone Rash    Rash resolved when amio stopped 2013    Patient Measurements: Height: 5\' 5"  (165.1 cm) Weight: 214 lb 8.1 oz (97.3 kg) IBW/kg (Calculated) : 61.5 Heparin Dosing Weight: 85kg  Vital Signs: Temp: 98.5 F (36.9 C) (05/25 2322) Temp Source: Oral (05/25 2322) BP: 92/55 mmHg (05/26 0400) Pulse Rate: 52 (05/26 0400)  Labs:  Recent Labs  05/17/16 2348 05/18/16 0439  05/18/16 1440 05/19/16 0445 05/19/16 1540 05/19/16 1630 05/20/16 0400  HGB  --  16.6  --   --  14.0  --   --  13.5  HCT  --  50.8  --   --  42.7  --   --  40.5  PLT  --  148*  --   --  103*  --   --  97*  LABPROT  --  16.1*  --   --   --   --   --   --   INR  --  1.28  --   --   --   --   --   --   HEPARINUNFRC  --   --   --   --   --  0.44  --  0.85*  CREATININE  --  2.26*  < > 3.20* 2.96*  --  3.02*  --   TROPONINI 25.87* 37.38*  --   --  23.43*  --   --   --   < > = values in this interval not displayed.  Estimated Creatinine Clearance: 25.4 mL/min (by C-G formula based on Cr of 3.02).   Medical History: Past Medical History  Diagnosis Date  . Essential hypertension, benign   . Atrial fibrillation (HCC)   . Cardiomyopathy (HCC)     Transient cardiomyopathy with EF 30-35% 03/27/12, improved to 55-60% 04/16/12  . Acute renal failure (HCC)     03/2012 (not on ACEI due to this) - renal US unremarkable  . Rectus sheath hematoma     Spontaneous during 03/2012 hospitalization managed conservatively with blood products  . Respiratory failure (HCC)     VDRF 03/2012 in setting of CHF  . Acute blood loss anemia     Due to rectus sheath hematoma 03/2012  . Unspecified sleep apnea     Suspected during 03/2012 hospitalization   . Hematuria 03/2012  . Hyperglycemia 03/2012  . Shock (HCC)     03/2012 hospitalization - Cardiogenic shock requiring pressors  initially, followed by hemorrhagic shock later in hospitalization  . Pleural effusion 03/2012  . CHF (congestive heart failure) (HCC)       Assessment: 69yom with Hx Afib not on AC d/t rectus sheath hematoma 2013.  Stopped meds 3 weeks ago and now acute HF  decompensation and Afib RVR.  Hx EF 25%> improved 55% 2016.  Hematuria last pm so heparin held.  H/H stable hematuria improving - restarted heparin today. Initial HL is therapeutic at 0.44. RN says no new s/s of bleeding.  This morning's HL is now supratherapeutic at 0.85 on heparin 1050 units/hr. Nurse reports no issues with infusion and improvement in reduction of blood from foley.  Goal of Therapy:  Heparin level 0.3-0.5 units/ml - given h/o bleeding  Monitor platelets by anticoagulation protocol: Yes   Plan:  Decrease heparin to 900 units/hr 8h HL Monitor daily HL, CBC and s/s of bleeding  Arlean Hopping. Newman Pies, PharmD, BCPS Clinical Pharmacist Pager [phone]

## 2016-05-20 NOTE — CV Procedure (Signed)
    TRANSESOPHAGEAL ECHOCARDIOGRAM GUIDED DIRECT CURRENT CARDIOVERSION  NAME:  Martin [name]   MRN: [id number] DOB:  [birth year]   ADMIT DATE: 05/17/2016  INDICATIONS:   PROCEDURE:   Informed consent was obtained prior to the procedure. The risks, benefits and alternatives for the procedure were discussed and the patient comprehended these risks.  Risks include, but are not limited to, cough, sore throat, vomiting, nausea, somnolence, esophageal and stomach trauma or perforation, bleeding, low blood pressure, aspiration, pneumonia, infection, trauma to the teeth and death.    After a procedural time-out, the patient was sedated by the anesthesia service. The TEE probe was passed into the esophagus without difficulty.    FINDINGS:  LEFT VENTRICLE: EF = 15-20% global HK  RIGHT VENTRICLE: Severely reduced function   LEFT ATRIUM: Markedly dilated 5.7 cm  LEFT ATRIAL APPENDAGE: No clot  RIGHT ATRIUM: Dilted  AORTIC VALVE:  Trileaflet. No AS/AI  MITRAL VALVE:    Moderate to severe posterior MR  TRICUSPID VALVE:  Normal mild TR  PULMONIC VALVE: Mild PR  INTERATRIAL SEPTUM: No ASD/PFO  PERICARDIUM: No effusion   DESCENDING AORTA: Mild plaque  + Pleural effusions    CARDIOVERSION:     Indications:  Atrial Fibrillation  Procedure Details:  Once the TEE was complete, the patient had the defibrillator pads placed in the anterior and posterior position. The patient then underwent further sedation by the anesthesia service for cardioversion. Once an appropriate level of sedation was achieved, the patient received a single biphasic, synchronized 200J shock with prompt conversion to sinus rhythm. Unfortunately he he relapsed back to NSR after several second. We performed a second shock with same result. No apparent complications.  Bettyjo Lundblad,MD 8:55 AM    CONCLUSION:   1.  Successful TEE guided cardioversion.

## 2016-05-20 NOTE — Anesthesia Preprocedure Evaluation (Addendum)
Anesthesia Evaluation  Patient identified by MRN, date of birth, ID band Patient awake    Reviewed: Allergy & Precautions, NPO status , Patient's Chart, lab work & pertinent test results, reviewed documented beta blocker date and time   History of Anesthesia Complications Negative for: history of anesthetic complications  Airway Mallampati: II  TM Distance: >3 FB Neck ROM: Full    Dental  (+) Poor Dentition, Loose, Dental Advisory Given, Missing, Chipped   Pulmonary sleep apnea (assumed) ,    breath sounds clear to auscultation       Cardiovascular hypertension, Pt. on medications and Pt. on home beta blockers +CHF (recent cardiogenic shock, still on milrinone)  + dysrhythmias Atrial Fibrillation  Rhythm:Irregular Rate:Tachycardia     Neuro/Psych negative neurological ROS     GI/Hepatic negative GI ROS, Neg liver ROS,   Endo/Other  negative endocrine ROSDiabetes: glu 131.  Renal/GU CRFRenal disease     Musculoskeletal   Abdominal (+) + obese,   Peds  Hematology  (+) anemia ,   Anesthesia Other Findings EF 20-25%.   Reproductive/Obstetrics                            Lab Results  Component Value Date   WBC 9.0 05/20/2016   HGB 13.5 05/20/2016   HCT 40.5 05/20/2016   MCV 88.0 05/20/2016   PLT 97* 05/20/2016   Lab Results  Component Value Date   CREATININE 2.84* 05/20/2016   BUN 44* 05/20/2016   NA 134* 05/20/2016   K 3.2* 05/20/2016   CL 92* 05/20/2016   CO2 30 05/20/2016    Anesthesia Physical  Anesthesia Plan  ASA: IV  Anesthesia Plan: MAC   Post-op Pain Management:    Induction: Intravenous  Airway Management Planned: Natural Airway and Simple Face Mask  Additional Equipment:   Intra-op Plan:   Post-operative Plan: Possible Post-op intubation/ventilation  Informed Consent: I have reviewed the patients History and Physical, chart, labs and discussed the  procedure including the risks, benefits and alternatives for the proposed anesthesia with the patient or authorized representative who has indicated his/her understanding and acceptance.   Dental advisory given  Plan Discussed with: CRNA  Anesthesia Plan Comments:         Anesthesia Quick Evaluation

## 2016-05-20 NOTE — Progress Notes (Signed)
Advanced Heart Failure Rounding Note  Referring Physician: Dr Diona Browner Primary Cardiologist: Dr. Gala Romney   Subjective:    Presented 05/18/16 after transfer from [facility] with cardiogenic shock. Placed on milrinone, norepi, and amio drip for afib.    Coox 72.2% on milrinone 0.375. CVP shows 10-11, though he remains markedly volume overloaded.  May need to re-calibrate.  Feeling OK this morning. Breathing continues to improve.  Still with lots of swelling.  No orthopnea. Urine clearing up.   Out 1.1 L and down 3 lbs. Creatinine slightly improved.   Objective:   Weight Range: 211 lb 13.8 oz (96.1 kg) Body mass index is 35.26 kg/(m^2).   Vital Signs:   Temp:  [97.6 F (36.4 C)-98.6 F (37 C)] 97.6 F (36.4 C) (05/26 0400) Pulse Rate:  [33-146] 76 (05/26 0600) Resp:  [15-26] 20 (05/26 0600) BP: (92-136)/(51-102) 111/84 mmHg (05/26 0600) SpO2:  [87 %-100 %] 95 % (05/26 0600) Weight:  [211 lb 13.8 oz (96.1 kg)] 211 lb 13.8 oz (96.1 kg) (05/26 0500) Last BM Date: 05/17/16  Weight change: Filed Weights   05/18/16 0500 05/19/16 0500 05/20/16 0500  Weight: 211 lb 3.2 oz (95.8 kg) 214 lb 8.1 oz (97.3 kg) 211 lb 13.8 oz (96.1 kg)    Intake/Output:   Intake/Output Summary (Last 24 hours) at 05/20/16 0714 Last data filed at 05/20/16 0630  Gross per 24 hour  Intake 2046.98 ml  Output   4955 ml  Net -2908.02 ml     Physical Exam: CVP 10-11 ?  General: Chronically ill and fatigued appearing. Wife present.  HEENT: Pale, 02 via [place] Neck: supple. JVP to ear. Carotids 2+ bilat; no bruits. No thyromegaly or nodule noted.  Cor: Irregularly irregular rate, tachy. +S3.  Lungs: Diminished throughout, slight crackles in bases Abdomen: soft, NT, mild/mod distended no HSM. No bruits or masses. +BS  Extremities: no cyanosis, clubbing, rash. 3-4+ edema into thighs. Extremely cold to the touch down at ankle, but slightly improved in calves and thighs Neuro: alert & orientedx3, cranial  nerves grossly intact. moves all 4 extremities w/o difficulty. Affect flat  Telemetry: Afib RVR 140s Labs: CBC  Recent Labs  05/17/16 1250  05/19/16 0445 05/20/16 0400  WBC 6.3  < > 8.4 9.0  NEUTROABS 4.6  --   --   --   HGB 16.8  < > 14.0 13.5  HCT 50.8  < > 42.7 40.5  MCV 92.2  < > 89.0 88.0  PLT 156  < > 103* 97*  < > = values in this interval not displayed. Basic Metabolic Panel  Recent Labs  05/18/16 1440 05/19/16 0445 05/19/16 1630 05/20/16 0400  NA 135 135 134* 134*  K 5.8* 3.9 4.2 3.2*  CL 102 97* 97* 92*  CO2 23 25 27 30   GLUCOSE 211* 218* 129* 141*  BUN 38* 41* 44* 44*  CREATININE 3.20* 2.96* 3.02* 2.84*  CALCIUM 9.2 8.8* 8.7* 8.7*  MG 2.1 1.8  --   --    Liver Function Tests  Recent Labs  05/19/16 0500 05/20/16 0400  AST 101* 57*  ALT 76* 58  ALKPHOS 77 73  BILITOT 1.1 1.0  PROT 5.7* 5.7*  ALBUMIN 2.9* 2.9*   No results for input(s): LIPASE, AMYLASE in the last 72 hours. Cardiac Enzymes  Recent Labs  05/17/16 2348 05/18/16 0439 05/19/16 0445  TROPONINI 25.87* 37.38* 23.43*    BNP: BNP (last 3 results)  Recent Labs  05/17/16 1250  BNP 905.0*  ProBNP (last 3 results) No results for input(s): PROBNP in the last 8760 hours.   D-Dimer No results for input(s): DDIMER in the last 72 hours. Hemoglobin A1C  Recent Labs  05/19/16 0945  HGBA1C 7.5*   Fasting Lipid Panel No results for input(s): CHOL, HDL, LDLCALC, TRIG, CHOLHDL, LDLDIRECT in the last 72 hours. Thyroid Function Tests  Recent Labs  05/17/16 1900  TSH 1.677    Other results:  Imaging/Studies:  Dg Chest Port 1 View  05/18/2016  CLINICAL DATA:  Encounter for is central line placement EXAM: PORTABLE CHEST 1 VIEW COMPARISON:  May 17, 2016 FINDINGS: Right central venous line is identified with distal tip in superior vena cava. There is no pleural line to suggest pneumothorax. There is opacity of the right mid and lung base unchanged. The left lung is clear.  The heart size is enlarged. The aorta is tortuous. Bony structures are stable. IMPRESSION: Right central venous line distal tip in the superior vena cava. There is no pneumothorax. Persistent consolidation of the right mid and lung base unchanged. Electronically Signed   By: Sherian Rein M.D.   On: 05/18/2016 16:09     Medications:     Scheduled Medications: . antiseptic oral rinse  7 mL Mouth Rinse BID  . aspirin EC  81 mg Oral Daily  . insulin aspart  0-5 Units Subcutaneous QHS  . insulin aspart  0-9 Units Subcutaneous TID WC  . metolazone  5 mg Oral BID  . sodium chloride flush  10-40 mL Intracatheter Q12H  . sodium chloride flush  3 mL Intravenous Q12H    Infusions: . amiodarone 60 mg/hr (05/20/16 0511)  . furosemide (LASIX) infusion 20 mg/hr (05/20/16 0640)  . heparin 900 Units/hr (05/20/16 0500)  . milrinone 0.375 mcg/kg/min (05/19/16 2320)  . norepinephrine (LEVOPHED) Adult infusion Stopped (05/18/16 2040)    PRN Medications: sodium chloride, acetaminophen, ondansetron (ZOFRAN) IV, sodium chloride flush, sodium chloride flush   Assessment   1. Cardiogenic Shock 2 Acute on chronic systolic HF in the setting of Afib RVR 3. Afib with hx of tachycardia mediated CMP 4. AKI on CKD stage 3 5. Hx of rectus sheath hematoma on coumadin 2013 - refuses anticoagulation 6. HTN 7. Hyperkalemia   Plan    K low this morning. Supp K. Suspect he will diurese better once back in Afib. Goal K > 4.0. Urine output much improved.   LFTS improved. Likely up from shock. Continue to follow.   Remains in Afib with poor rate control, though converted on amio earlier this admission. Schedule for bedside TEE/DCCV this am.   He will need cath if/when his condition and renal function improves.  Currently pt and family confirm he wishes to remain full code.   Pt and family understand he is critically ill. Prognosis remains guarded.    Length of Stay: 3  Graciella Freer  PA-C 05/20/2016, 7:14 AM  Advanced Heart Failure Team Pager [phone] (M-F; 7a - 4p)  Please contact [facility] for night-coverage after hours (4p -7a ) and weekends on [email or web address]   Patient seen and examined with Otilio Saber, PA-C. We discussed all aspects of the encounter. I agree with the assessment and plan as stated above.   CVP improving. Co-ox looks good. Creatinine stuck around 3. Suspect this may be new baseline.    Remains in AF with RVR despite amio. Failed DC-CV this am. Will continue amio/heparin and add Ranexa. Plan repeat DC-CV on Monday if still in AF.  Ideally will need cath with Trop > 30 but renal function may not permit.   Bensimhon, Daniel,MD 8:52 AM

## 2016-05-20 NOTE — Progress Notes (Signed)
ANTICOAGULATION CONSULT NOTE - Follow Up Consult  Pharmacy Consult for Heparin Indication: atrial fibrillation  Allergies  Allergen Reactions  . Amiodarone Rash    Rash resolved when amio stopped 2013    Patient Measurements: Height: 5\' 5"  (165.1 cm) Weight: 211 lb 13.8 oz (96.1 kg) IBW/kg (Calculated) : 61.5 Heparin Dosing Weight: 85kg  Vital Signs: Temp: 98.1 F (36.7 C) (05/26 1948) Temp Source: Oral (05/26 1948) BP: 129/112 mmHg (05/26 2300) Pulse Rate: 136 (05/26 1700)  Labs:  Recent Labs  05/17/16 2348  05/18/16 0439  05/19/16 0445  05/19/16 1630 05/20/16 0400 05/20/16 1313 05/20/16 2200  HGB  --   < > 16.6  --  14.0  --   --  13.5  --   --   HCT  --   --  50.8  --  42.7  --   --  40.5  --   --   PLT  --   --  148*  --  103*  --   --  97*  --   --   LABPROT  --   --  16.1*  --   --   --   --   --   --   --   INR  --   --  1.28  --   --   --   --   --   --   --   HEPARINUNFRC  --   --   --   --   --   < >  --  0.85* 0.59 0.33  CREATININE  --   --  2.26*  < > 2.96*  --  3.02* 2.84*  --   --   TROPONINI 25.87*  --  37.38*  --  23.43*  --   --   --   --   --   < > = values in this interval not displayed.  Estimated Creatinine Clearance: 26.9 mL/min (by C-G formula based on Cr of 2.84).   Medical History: Past Medical History  Diagnosis Date  . Essential hypertension, benign   . Atrial fibrillation (HCC)   . Cardiomyopathy (HCC)     Transient cardiomyopathy with EF 30-35% 03/27/12, improved to 55-60% 04/16/12  . Acute renal failure (HCC)     03/2012 (not on ACEI due to this) - renal US unremarkable  . Rectus sheath hematoma     Spontaneous during 03/2012 hospitalization managed conservatively with blood products  . Respiratory failure (HCC)     VDRF 03/2012 in setting of CHF  . Acute blood loss anemia     Due to rectus sheath hematoma 03/2012  . Unspecified sleep apnea     Suspected during 03/2012 hospitalization   . Hematuria 03/2012  . Hyperglycemia 03/2012   . Shock (HCC)     03/2012 hospitalization - Cardiogenic shock requiring pressors initially, followed by hemorrhagic shock later in hospitalization  . Pleural effusion 03/2012  . CHF (congestive heart failure) (HCC)       Assessment: 69yom with Hx Afib not on AC d/t rectus sheath hematoma 2013 and resistant to restart now.  Stopped meds 3 weeks ago and now acute HF decompensation and Afib RVR.  Hx EF 25%> improved 55% 2016. Pharmacy consulted to dose heparin gtt. Hematuria on 5/24 so heparin held, improved on 5/25 so restarted. H/H stable and hematuria continues to improve.  This morning's HL was supratherapeutic at 0.85 on heparin 1050 units/hr. HL has trended down but still slightly  above lower goal at 0.59 after rate decrease to 900 units/hr. Now s/p failed DCCV this am. F/u plans for transition to oral AC.   F/u HL is therapeutic at 0.33 on heparin 850 units/hr. No issues with infusion or bleeding noted.  Goal of Therapy:  Heparin level 0.3-0.5 units/ml - given h/o bleeding  Monitor platelets by anticoagulation protocol: Yes   Plan:  Continue heparin 850 units/hr 8h HL  Monitor daily HL, CBC and s/s of bleeding   Arlean Hopping. Newman Pies, PharmD, BCPS Clinical Pharmacist Pager [phone]  05/20/2016 11:27 PM

## 2016-05-20 NOTE — Transfer of Care (Signed)
Immediate Anesthesia Transfer of Care Note  Patient: Martin [name]  Procedure(s) Performed: Procedure(s): CARDIOVERSION (N/A) TRANSESOPHAGEAL ECHOCARDIOGRAM (TEE) (N/A)  Patient Location: Nursing Unit  Anesthesia Type:MAC  Level of Consciousness: awake, alert  and oriented  Airway & Oxygen Therapy: Patient Spontanous Breathing  Post-op Assessment: Report given to RN and Post -op Vital signs reviewed and stable  Post vital signs: Reviewed and stable  Last Vitals:  Filed Vitals:   05/20/16 0852 05/20/16 0853  BP:    Pulse: 143 143  Temp:    Resp: 22 20    Last Pain:  Filed Vitals:   05/20/16 0836  PainSc: Asleep         Complications: No apparent anesthesia complications

## 2016-05-21 LAB — CBC WITH DIFFERENTIAL/PLATELET
Basophils Absolute: 0 10*3/uL (ref 0.0–0.1)
Basophils Relative: 0 %
EOS ABS: 0 10*3/uL (ref 0.0–0.7)
EOS PCT: 0 %
HCT: 43.6 % (ref 39.0–52.0)
Hemoglobin: 14.7 g/dL (ref 13.0–17.0)
Lymphocytes Relative: 10 %
Lymphs Abs: 0.8 10*3/uL (ref 0.7–4.0)
MCH: 29.6 pg (ref 26.0–34.0)
MCHC: 33.7 g/dL (ref 30.0–36.0)
MCV: 87.7 fL (ref 78.0–100.0)
MONO ABS: 1 10*3/uL (ref 0.1–1.0)
Monocytes Relative: 13 %
Neutro Abs: 5.7 10*3/uL (ref 1.7–7.7)
Neutrophils Relative %: 76 %
PLATELETS: 100 10*3/uL — AB (ref 150–400)
RBC: 4.97 MIL/uL (ref 4.22–5.81)
RDW: 14.7 % (ref 11.5–15.5)
WBC: 7.5 10*3/uL (ref 4.0–10.5)

## 2016-05-21 LAB — GLUCOSE, CAPILLARY
GLUCOSE-CAPILLARY: 136 mg/dL — AB (ref 65–99)
Glucose-Capillary: 134 mg/dL — ABNORMAL HIGH (ref 65–99)
Glucose-Capillary: 130 mg/dL — ABNORMAL HIGH (ref 65–99)
Glucose-Capillary: 139 mg/dL — ABNORMAL HIGH (ref 65–99)

## 2016-05-21 LAB — CARBOXYHEMOGLOBIN
Carboxyhemoglobin: 1.1 % (ref 0.5–1.5)
METHEMOGLOBIN: 0.8 % (ref 0.0–1.5)
O2 SAT: 66.7 %
TOTAL HEMOGLOBIN: 15.3 g/dL (ref 13.5–18.0)

## 2016-05-21 LAB — COMPREHENSIVE METABOLIC PANEL
ALBUMIN: 2.9 g/dL — AB (ref 3.5–5.0)
ALT: 47 U/L (ref 17–63)
ANION GAP: 11 (ref 5–15)
AST: 39 U/L (ref 15–41)
Alkaline Phosphatase: 83 U/L (ref 38–126)
BUN: 41 mg/dL — ABNORMAL HIGH (ref 6–20)
CHLORIDE: 82 mmol/L — AB (ref 101–111)
CO2: 38 mmol/L — AB (ref 22–32)
Calcium: 8.9 mg/dL (ref 8.9–10.3)
Creatinine, Ser: 2.44 mg/dL — ABNORMAL HIGH (ref 0.61–1.24)
GFR calc non Af Amer: 26 mL/min — ABNORMAL LOW (ref >60)
GFR, EST AFRICAN AMERICAN: 30 mL/min — AB (ref >60)
Glucose, Bld: 131 mg/dL — ABNORMAL HIGH (ref 65–99)
Potassium: 3 mmol/L — ABNORMAL LOW (ref 3.5–5.1)
SODIUM: 131 mmol/L — AB (ref 135–145)
Total Bilirubin: 1 mg/dL (ref 0.3–1.2)
Total Protein: 6.1 g/dL — ABNORMAL LOW (ref 6.5–8.1)

## 2016-05-21 LAB — HEPARIN LEVEL (UNFRACTIONATED): HEPARIN UNFRACTIONATED: 0.48 [IU]/mL (ref 0.30–0.70)

## 2016-05-21 LAB — MAGNESIUM: MAGNESIUM: 1.7 mg/dL (ref 1.7–2.4)

## 2016-05-21 MED ORDER — ATORVASTATIN CALCIUM 80 MG PO TABS
80.0000 mg | ORAL_TABLET | Freq: Every day | ORAL | Status: DC
  Administered 2016-05-21 – 2016-05-26 (×6): 80 mg via ORAL
  Filled 2016-05-21 (×6): qty 1

## 2016-05-21 MED ORDER — POTASSIUM CHLORIDE CRYS ER 20 MEQ PO TBCR
40.0000 meq | EXTENDED_RELEASE_TABLET | Freq: Once | ORAL | Status: AC
  Administered 2016-05-21: 40 meq via ORAL
  Filled 2016-05-21: qty 2

## 2016-05-21 MED ORDER — POTASSIUM CHLORIDE CRYS ER 20 MEQ PO TBCR
40.0000 meq | EXTENDED_RELEASE_TABLET | Freq: Two times a day (BID) | ORAL | Status: DC
Start: 2016-05-21 — End: 2016-05-25
  Administered 2016-05-21 – 2016-05-24 (×8): 40 meq via ORAL
  Filled 2016-05-21 (×8): qty 2

## 2016-05-21 NOTE — Progress Notes (Signed)
ANTICOAGULATION CONSULT NOTE - Follow Up Consult  Pharmacy Consult for heparin Indication: atrial fibrillation  Allergies  Allergen Reactions  . Amiodarone Rash    Rash resolved when amio stopped 2013    Patient Measurements: Height: 5\' 5"  (165.1 cm) Weight: 197 lb 15.6 oz (89.8 kg) IBW/kg (Calculated) : 61.5 Heparin Dosing Weight: 80.7 kg  Vital Signs: Temp: 98.5 F (36.9 C) (05/27 0400) Temp Source: Oral (05/27 0400) BP: 108/88 mmHg (05/27 0700)  Labs:  Recent Labs  05/19/16 0445  05/19/16 1630 05/20/16 0400 05/20/16 1313 05/20/16 2200 05/21/16 0315 05/21/16 0645  HGB 14.0  --   --  13.5  --   --  14.7  --   HCT 42.7  --   --  40.5  --   --  43.6  --   PLT 103*  --   --  97*  --   --  100*  --   HEPARINUNFRC  --   < >  --  0.85* 0.59 0.33  --  0.48  CREATININE 2.96*  --  3.02* 2.84*  --   --  2.44*  --   TROPONINI 23.43*  --   --   --   --   --   --   --   < > = values in this interval not displayed.  Estimated Creatinine Clearance: 30.3 mL/min (by C-G formula based on Cr of 2.44).   Medications:  Prescriptions prior to admission  Medication Sig Dispense Refill Last Dose  . carvedilol (COREG) 6.25 MG tablet Take 1 tablet (6.25 mg total) by mouth 2 (two) times daily with a meal. 60 tablet 6 Past Week at Unknown time  . furosemide (LASIX) 40 MG tablet Take 1 tab in AM and 1/2 tab in PM 30 tablet 6 05/16/2016 at Unknown time    Assessment: 70 yo M admitted 05/17/2016 with a history of Afib not on anticoagulation d/t rectus sheath hematoma in 2013 and resistant to restart now. Patient stopped medications 3 weeks prior to admission and now in acute decompensated HF and Afib with RVR. Hematuria on 5/24 heparin held but improved on 5/25 so heparin resumed.  HL this morning 0.48, therapeutic but on higher end of range.  Hgb 14.7, Plt 100. Some hematuria overnight.  Goal of Therapy:  Heparin level 0.3-0.5 units/ml - given history of bleeding Monitor platelets by  anticoagulation protocol: Yes   Plan:  - Decrease heparin to 800 units/hr - Monitor daily HL, CBC and s/sx of bleeding  Casilda Carls, PharmD. PGY-1 Pharmacy Resident Pager: [phone] 05/21/2016,7:48 AM

## 2016-05-21 NOTE — Progress Notes (Addendum)
Advanced Heart Failure Rounding Note  Referring Physician: Dr Diona Browner Primary Cardiologist: Dr. Gala Romney   Subjective:    Presented 05/18/16 after transfer from [facility] with cardiogenic shock. Placed on milrinone, norepi, and amio drip for afib.    Failed DC-CV 5/26 depite amio. Ranexa added.   TEE 5/26 with EF 15-20% and severe RV dysfunction. LA 5.6 cm  Remains in AF at 130-140 Coox 67% on milrinone 0.375. CVP 10. 1 . Diuresing well on lasix gtt at 20 though he remains markedly volume overloaded. Denies dyspnea. Off O2.  Urine clearing up. Creatinine down to 2.4    Objective:   Weight Range: 89.8 kg (197 lb 15.6 oz) Body mass index is 32.94 kg/(m^2).   Vital Signs:   Temp:  [97.8 F (36.6 C)-98.7 F (37.1 C)] 98.2 F (36.8 C) (05/27 0748) Pulse Rate:  [76-136] 136 (05/26 1700) Resp:  [15-24] 19 (05/27 0800) BP: (91-129)/(56-112) 109/72 mmHg (05/27 0800) SpO2:  [93 %-100 %] 95 % (05/27 0800) Weight:  [89.8 kg (197 lb 15.6 oz)] 89.8 kg (197 lb 15.6 oz) (05/27 0400) Last BM Date: 05/17/16 (pt given prune juice)  Weight change: Filed Weights   05/19/16 0500 05/20/16 0500 05/21/16 0400  Weight: 97.3 kg (214 lb 8.1 oz) 96.1 kg (211 lb 13.8 oz) 89.8 kg (197 lb 15.6 oz)    Intake/Output:   Intake/Output Summary (Last 24 hours) at 05/21/16 1129 Last data filed at 05/21/16 1000  Gross per 24 hour  Intake 1802.95 ml  Output   8780 ml  Net -6977.05 ml     Physical Exam: CVP 10 General: Lying in bed. NAD HEENT: Normal except for poor dentition Neck: supple. JVP to ear. Carotids 2+ bilat; no bruits. No thyromegaly or nodule noted.  Cor: Irregularly irregular rate, tachy. +S3.  Lungs: clear anteriorly  Abdomen: soft, NT, mild/mod distended no HSM. No bruits or masses. +BS  + mass in R adbomen Extremities: no cyanosis, clubbing, rash. 2-3+ edema into thighs.  Neuro: alert & orientedx3, cranial nerves grossly intact. moves all 4 extremities w/o difficulty. Affect  flat  Telemetry: Afib RVR 130-140s Labs: CBC  Recent Labs  05/20/16 0400 05/21/16 0315  WBC 9.0 7.5  NEUTROABS  --  5.7  HGB 13.5 14.7  HCT 40.5 43.6  MCV 88.0 87.7  PLT 97* 100*   Basic Metabolic Panel  Recent Labs  05/19/16 0445  05/20/16 0400 05/21/16 0315  NA 135  < > 134* 131*  K 3.9  < > 3.2* 3.0*  CL 97*  < > 92* 82*  CO2 25  < > 30 38*  GLUCOSE 218*  < > 141* 131*  BUN 41*  < > 44* 41*  CREATININE 2.96*  < > 2.84* 2.44*  CALCIUM 8.8*  < > 8.7* 8.9  MG 1.8  --   --  1.7  < > = values in this interval not displayed. Liver Function Tests  Recent Labs  05/20/16 0400 05/21/16 0315  AST 57* 39  ALT 58 47  ALKPHOS 73 83  BILITOT 1.0 1.0  PROT 5.7* 6.1*  ALBUMIN 2.9* 2.9*   No results for input(s): LIPASE, AMYLASE in the last 72 hours. Cardiac Enzymes  Recent Labs  05/19/16 0445  TROPONINI 23.43*    BNP: BNP (last 3 results)  Recent Labs  05/17/16 1250  BNP 905.0*    ProBNP (last 3 results) No results for input(s): PROBNP in the last 8760 hours.   D-Dimer No results for  input(s): DDIMER in the last 72 hours. Hemoglobin A1C  Recent Labs  05/19/16 0945  HGBA1C 7.5*   Fasting Lipid Panel No results for input(s): CHOL, HDL, LDLCALC, TRIG, CHOLHDL, LDLDIRECT in the last 72 hours. Thyroid Function Tests No results for input(s): TSH, T4TOTAL, T3FREE, THYROIDAB in the last 72 hours.  Invalid input(s): FREET3  Other results:  Imaging/Studies:  No results found.   Medications:     Scheduled Medications: . antiseptic oral rinse  7 mL Mouth Rinse BID  . aspirin EC  81 mg Oral Daily  . insulin aspart  0-5 Units Subcutaneous QHS  . insulin aspart  0-9 Units Subcutaneous TID WC  . metolazone  5 mg Oral BID  . ranolazine  500 mg Oral BID  . sodium chloride flush  10-40 mL Intracatheter Q12H  . sodium chloride flush  3 mL Intravenous Q12H  . sodium chloride flush  3 mL Intravenous Q12H    Infusions: . sodium chloride    .  amiodarone 60 mg/hr (05/21/16 1101)  . furosemide (LASIX) infusion 20 mg/hr (05/21/16 0956)  . heparin 800 Units/hr (05/21/16 1058)  . milrinone 0.375 mcg/kg/min (05/21/16 1055)  . norepinephrine (LEVOPHED) Adult infusion Stopped (05/18/16 2040)    PRN Medications: sodium chloride, acetaminophen, ondansetron (ZOFRAN) IV, sodium chloride flush, sodium chloride flush, sodium chloride flush   Assessment   1. Cardiogenic Shock 2 Acute on chronic systolic HF in the setting of Afib RVR 3. Afib with hx of tachycardia mediated CMP This patients CHA2DS2-VASc Score and unadjusted Ischemic Stroke Rate (% per year) is equal to 4.8 % stroke rate/year from a score of 4 (CHF, HTN, age, vascular disease) 4. AKI on CKD stage 3 5. Hx of rectus sheath hematoma on coumadin 2013 - refuses anticoagulation 6. HTN 7. Hyperkalemia  8. Hypokalemia  Plan    CVP improving but still up. Co-ox looks good. Diuresing well. Continue IV lasix and milrinone. Can decrease milrinone to 0.25. Supp K+.    Remains in AF with RVR despite amio. Failed DC-CV on 5/26. Ranexa added.  Will continue amio/heparin. Plan repeat DC-CV on Tuesday if still in AF. If fails will need to consider AVN ablation and BIV pacer.  Ideally will need cath with Trop > 30 but renal function may not permit.     Length of Stay: 4  Bensimhon, Daniel MD 05/21/2016, 11:29 AM  Advanced Heart Failure Team Pager [phone] (M-F; 7a - 4p)  Please contact [facility] for night-coverage after hours (4p -7a ) and weekends on [email or web address]

## 2016-05-22 LAB — CBC WITH DIFFERENTIAL/PLATELET
BASOS PCT: 0 %
Basophils Absolute: 0 10*3/uL (ref 0.0–0.1)
EOS ABS: 0 10*3/uL (ref 0.0–0.7)
Eosinophils Relative: 1 %
HEMATOCRIT: 46.2 % (ref 39.0–52.0)
Hemoglobin: 15.8 g/dL (ref 13.0–17.0)
LYMPHS ABS: 0.7 10*3/uL (ref 0.7–4.0)
Lymphocytes Relative: 8 %
MCH: 29.6 pg (ref 26.0–34.0)
MCHC: 34.2 g/dL (ref 30.0–36.0)
MCV: 86.7 fL (ref 78.0–100.0)
MONO ABS: 1.1 10*3/uL — AB (ref 0.1–1.0)
MONOS PCT: 13 %
NEUTROS ABS: 6.9 10*3/uL (ref 1.7–7.7)
Neutrophils Relative %: 79 %
Platelets: 115 10*3/uL — ABNORMAL LOW (ref 150–400)
RBC: 5.33 MIL/uL (ref 4.22–5.81)
RDW: 14.4 % (ref 11.5–15.5)
WBC: 8.7 10*3/uL (ref 4.0–10.5)

## 2016-05-22 LAB — COMPREHENSIVE METABOLIC PANEL WITH GFR
ALT: 39 U/L (ref 17–63)
AST: 30 U/L (ref 15–41)
Albumin: 3 g/dL — ABNORMAL LOW (ref 3.5–5.0)
Alkaline Phosphatase: 89 U/L (ref 38–126)
Anion gap: 12 (ref 5–15)
BUN: 46 mg/dL — ABNORMAL HIGH (ref 6–20)
CO2: 44 mmol/L — ABNORMAL HIGH (ref 22–32)
Calcium: 9.3 mg/dL (ref 8.9–10.3)
Chloride: 74 mmol/L — ABNORMAL LOW (ref 101–111)
Creatinine, Ser: 2.63 mg/dL — ABNORMAL HIGH (ref 0.61–1.24)
GFR calc Af Amer: 27 mL/min — ABNORMAL LOW
GFR calc non Af Amer: 24 mL/min — ABNORMAL LOW
Glucose, Bld: 148 mg/dL — ABNORMAL HIGH (ref 65–99)
Potassium: 3.7 mmol/L (ref 3.5–5.1)
Sodium: 130 mmol/L — ABNORMAL LOW (ref 135–145)
Total Bilirubin: 1.3 mg/dL — ABNORMAL HIGH (ref 0.3–1.2)
Total Protein: 6.3 g/dL — ABNORMAL LOW (ref 6.5–8.1)

## 2016-05-22 LAB — GLUCOSE, CAPILLARY
GLUCOSE-CAPILLARY: 179 mg/dL — AB (ref 65–99)
Glucose-Capillary: 164 mg/dL — ABNORMAL HIGH (ref 65–99)
Glucose-Capillary: 141 mg/dL — ABNORMAL HIGH (ref 65–99)
Glucose-Capillary: 158 mg/dL — ABNORMAL HIGH (ref 65–99)

## 2016-05-22 LAB — HEPARIN LEVEL (UNFRACTIONATED)
Heparin Unfractionated: 0.26 IU/mL — ABNORMAL LOW (ref 0.30–0.70)
Heparin Unfractionated: 0.28 [IU]/mL — ABNORMAL LOW (ref 0.30–0.70)
Heparin Unfractionated: 0.5 [IU]/mL (ref 0.30–0.70)

## 2016-05-22 LAB — CARBOXYHEMOGLOBIN
Carboxyhemoglobin: 1.5 % (ref 0.5–1.5)
Methemoglobin: 0.7 % (ref 0.0–1.5)
O2 Saturation: 59.4 %
Total hemoglobin: 16.6 g/dL (ref 13.5–18.0)

## 2016-05-22 NOTE — Progress Notes (Signed)
ANTICOAGULATION CONSULT NOTE - Follow Up Consult  Pharmacy Consult for heparin Indication: atrial fibrillation  Allergies  Allergen Reactions  . Amiodarone Rash    Rash resolved when amio stopped 2013    Patient Measurements: Height: 5\' 5"  (165.1 cm) Weight: 191 lb 12.8 oz (87 kg) IBW/kg (Calculated) : 61.5 Heparin Dosing Weight: 80.7 kg  Vital Signs: Temp: 98.5 F (36.9 C) (05/28 1200) Temp Source: Oral (05/28 1200) BP: 89/75 mmHg (05/28 1400) Pulse Rate: 116 (05/28 1400)  Labs:  Recent Labs  05/20/16 0400  05/20/16 2200 05/21/16 0315 05/21/16 0645 05/22/16 0520  HGB 13.5  --   --  14.7  --  15.8  HCT 40.5  --   --  43.6  --  46.2  PLT 97*  --   --  100*  --  115*  HEPARINUNFRC 0.85*  < > 0.33  --  0.48 0.26*  CREATININE 2.84*  --   --  2.44*  --  2.63*  < > = values in this interval not displayed.  Estimated Creatinine Clearance: 27.6 mL/min (by C-G formula based on Cr of 2.63).   Medications:  Prescriptions prior to admission  Medication Sig Dispense Refill Last Dose  . carvedilol (COREG) 6.25 MG tablet Take 1 tablet (6.25 mg total) by mouth 2 (two) times daily with a meal. 60 tablet 6 Past Week at Unknown time  . furosemide (LASIX) 40 MG tablet Take 1 tab in AM and 1/2 tab in PM 30 tablet 6 05/16/2016 at Unknown time    Assessment: 70 yo M admitted 05/17/2016 with a history of Afib not on anticoagulation d/t rectus sheath hematoma in 2013 and resistant to restart now. Patient stopped medications 3 weeks prior to admission and now in acute decompensated HF and Afib with RVR. Hematuria on 5/24 heparin held but improved on 5/25 so heparin resumed.  HL 0.28 (sub-therapeutic), Hgb 15.8, Plt 115. No further s/sx of bleeding noted. Spoke with RN, no issues with line or bleeding.  Goal of Therapy:  Heparin level 0.3-0.5 units/ml - given history of bleeding Monitor platelets by anticoagulation protocol: Yes   Plan:  - Increase heparin to 1050 units/hr - Monitor  8 hr HL - Monitor daily HL, CBC and s/sx of bleeding  Casilda Carls, PharmD. PGY-1 Pharmacy Resident Pager: [phone] 05/22/2016,2:47 PM

## 2016-05-22 NOTE — Progress Notes (Signed)
Orthopedic Tech Progress Note Patient Details:  Martin [name] 1946/09/19 [id number]  Ortho Devices Type of Ortho Device: Roland Rack boot Ortho Device/Splint Location: bilateral Ortho Device/Splint Interventions: Application   Kenneith Stief 05/22/2016, 2:28 PM

## 2016-05-22 NOTE — Progress Notes (Addendum)
Advanced Heart Failure Rounding Note  Referring Physician: Dr Diona Browner Primary Cardiologist: Dr. Gala Romney   Subjective:    Presented 05/18/16 after transfer from [facility] with cardiogenic shock. Placed on milrinone, norepi, and amio drip for afib.    Failed DC-CV 5/26 depite amio. Ranexa added.   TEE 5/26 with EF 15-20% and severe RV dysfunction. LA 5.6 cm  Remains on milrinone at 0.375 (i discussed cutting to 0.25 but I didn't write order). Remains in AF but rate slightly better. Now 115-120. Coox 67->59%.   Diuresing well on lasix gtt at 20. Weight down another 6 pounds. CVP 6  Denies dyspnea. Creatinine up slightly 2.4 -> 2.6    Objective:   Weight Range: 87 kg (191 lb 12.8 oz) Body mass index is 31.92 kg/(m^2).   Vital Signs:   Temp:  [98 F (36.7 C)-98.6 F (37 C)] 98.4 F (36.9 C) (05/28 0410) Pulse Rate:  [50-145] 135 (05/28 0410) Resp:  [16-24] 17 (05/28 0700) BP: (87-119)/(59-99) 101/63 mmHg (05/28 0700) SpO2:  [89 %-98 %] 90 % (05/28 0410) Weight:  [87 kg (191 lb 12.8 oz)] 87 kg (191 lb 12.8 oz) (05/28 0410) Last BM Date: 05/21/16  Weight change: Filed Weights   05/20/16 0500 05/21/16 0400 05/22/16 0410  Weight: 96.1 kg (211 lb 13.8 oz) 89.8 kg (197 lb 15.6 oz) 87 kg (191 lb 12.8 oz)    Intake/Output:   Intake/Output Summary (Last 24 hours) at 05/22/16 0954 Last data filed at 05/22/16 0700  Gross per 24 hour  Intake 1826.47 ml  Output   6200 ml  Net -4373.53 ml     Physical Exam: CVP 6 General: Sitting in chair. NAD HEENT: Normal except for poor dentition Neck: supple. JVP 6. Carotids 2+ bilat; no bruits. No thyromegaly or nodule noted.  Cor: Irregularly irregular rate, tachy. +S3.  Lungs: clear anteriorly  Abdomen: soft, NT, mild/mod distended no HSM. No bruits or masses. +BS  + mass in R adbomen Extremities: no cyanosis, clubbing, rash. 2-3+ edema in ankles. Thigh edema much improved Neuro: alert & orientedx3, cranial nerves grossly intact.  moves all 4 extremities w/o difficulty. Affect pleasant  Telemetry: Afib RVR 115-120  Labs: CBC  Recent Labs  05/21/16 0315 05/22/16 0520  WBC 7.5 8.7  NEUTROABS 5.7 6.9  HGB 14.7 15.8  HCT 43.6 46.2  MCV 87.7 86.7  PLT 100* 115*   Basic Metabolic Panel  Recent Labs  05/21/16 0315 05/22/16 0520  NA 131* 130*  K 3.0* 3.7  CL 82* 74*  CO2 38* 44*  GLUCOSE 131* 148*  BUN 41* 46*  CREATININE 2.44* 2.63*  CALCIUM 8.9 9.3  MG 1.7  --    Liver Function Tests  Recent Labs  05/21/16 0315 05/22/16 0520  AST 39 30  ALT 47 39  ALKPHOS 83 89  BILITOT 1.0 1.3*  PROT 6.1* 6.3*  ALBUMIN 2.9* 3.0*   No results for input(s): LIPASE, AMYLASE in the last 72 hours. Cardiac Enzymes No results for input(s): CKTOTAL, CKMB, CKMBINDEX, TROPONINI in the last 72 hours.  BNP: BNP (last 3 results)  Recent Labs  05/17/16 1250  BNP 905.0*    ProBNP (last 3 results) No results for input(s): PROBNP in the last 8760 hours.   D-Dimer No results for input(s): DDIMER in the last 72 hours. Hemoglobin A1C No results for input(s): HGBA1C in the last 72 hours. Fasting Lipid Panel No results for input(s): CHOL, HDL, LDLCALC, TRIG, CHOLHDL, LDLDIRECT in the last 72  hours. Thyroid Function Tests No results for input(s): TSH, T4TOTAL, T3FREE, THYROIDAB in the last 72 hours.  Invalid input(s): FREET3  Other results:  Imaging/Studies:  No results found.   Medications:     Scheduled Medications: . antiseptic oral rinse  7 mL Mouth Rinse BID  . aspirin EC  81 mg Oral Daily  . atorvastatin  80 mg Oral q1800  . insulin aspart  0-5 Units Subcutaneous QHS  . insulin aspart  0-9 Units Subcutaneous TID WC  . metolazone  5 mg Oral BID  . potassium chloride  40 mEq Oral BID  . ranolazine  500 mg Oral BID  . sodium chloride flush  10-40 mL Intracatheter Q12H  . sodium chloride flush  3 mL Intravenous Q12H  . sodium chloride flush  3 mL Intravenous Q12H    Infusions: . sodium  chloride    . amiodarone 60 mg/hr (05/22/16 0506)  . furosemide (LASIX) infusion 20 mg/hr (05/21/16 2245)  . heparin 850 Units/hr (05/22/16 9371)  . milrinone 0.375 mcg/kg/min (05/22/16 0506)  . norepinephrine (LEVOPHED) Adult infusion Stopped (05/18/16 2040)    PRN Medications: sodium chloride, acetaminophen, ondansetron (ZOFRAN) IV, sodium chloride flush, sodium chloride flush, sodium chloride flush   Assessment   1. Cardiogenic Shock 2 Acute on chronic systolic HF in the setting of Afib RVR 3. Afib with hx of tachycardia mediated CMP This patients CHA2DS2-VASc Score and unadjusted Ischemic Stroke Rate (% per year) is equal to 4.8 % stroke rate/year from a score of 4 (CHF, HTN, age, vascular disease) 4. AKI on CKD stage 3 5. Hx of rectus sheath hematoma on coumadin 2013 - refuses anticoagulation 6. HTN 7. Hyperkalemia  8. Hypokalemia  Plan     Remains in AF with RVR despite amio. Failed DC-CV on 5/26. Ranexa added. Rate slightly better.  Will continue amio/heparin. Plan repeat DC-CV on Tuesday if still in AF. If fails will need to consider AVN ablation and BIV pacer.  Volume status continues to improve. CVP down but still with signifcant ankle edema. Continue milrinone 0.375. If co-ox stable tomorrow can cut to 0.25. Renal function relatively stable. Supp K+.  Will cut lasix gtt back to 15. Place UNNA boots for better compression.   Ideally will need cath with Trop > 30 but renal function may not permit.   Length of Stay: 5 Sherrica Niehaus MD 05/22/2016, 9:54 AM  Advanced Heart Failure Team Pager [phone] (M-F; 7a - 4p)  Please contact [facility] for night-coverage after hours (4p -7a ) and weekends on [email or web address]

## 2016-05-22 NOTE — Progress Notes (Signed)
ANTICOAGULATION CONSULT NOTE - Follow Up Consult  Pharmacy Consult for heparin Indication: atrial fibrillation  Allergies  Allergen Reactions  . Amiodarone Rash    Rash resolved when amio stopped 2013    Patient Measurements: Height: 5\' 5"  (165.1 cm) Weight: 191 lb 12.8 oz (87 kg) IBW/kg (Calculated) : 61.5 Heparin Dosing Weight: 80.7 kg  Vital Signs: Temp: 98.4 F (36.9 C) (05/28 0410) Temp Source: Oral (05/28 0410) BP: 104/83 mmHg (05/28 0500) Pulse Rate: 135 (05/28 0410)  Labs:  Recent Labs  05/20/16 0400  05/20/16 2200 05/21/16 0315 05/21/16 0645 05/22/16 0520  HGB 13.5  --   --  14.7  --  15.8  HCT 40.5  --   --  43.6  --  46.2  PLT 97*  --   --  100*  --  115*  HEPARINUNFRC 0.85*  < > 0.33  --  0.48 0.26*  CREATININE 2.84*  --   --  2.44*  --  2.63*  < > = values in this interval not displayed.  Estimated Creatinine Clearance: 27.6 mL/min (by C-G formula based on Cr of 2.63).   Medications:  Prescriptions prior to admission  Medication Sig Dispense Refill Last Dose  . carvedilol (COREG) 6.25 MG tablet Take 1 tablet (6.25 mg total) by mouth 2 (two) times daily with a meal. 60 tablet 6 Past Week at Unknown time  . furosemide (LASIX) 40 MG tablet Take 1 tab in AM and 1/2 tab in PM 30 tablet 6 05/16/2016 at Unknown time    Assessment: 70 yo M admitted 05/17/2016 with a history of Afib not on anticoagulation d/t rectus sheath hematoma in 2013 and resistant to restart now. Patient stopped medications 3 weeks prior to admission and now in acute decompensated HF and Afib with RVR. Hematuria on 5/24 heparin held but improved on 5/25 so heparin resumed.  HL this morning 0.26 on heparin 800 units/hr. Nurse reports issues with infusion and bleeding has resolved from yesterday.  Goal of Therapy:  Heparin level 0.3-0.5 units/ml - given history of bleeding Monitor platelets by anticoagulation protocol: Yes   Plan:  Increase heparin to 850 units/hr 8h HL Monitor  daily HL, CBC and s/sx of bleeding  Arlean Hopping. Newman Pies, PharmD, BCPS Clinical Pharmacist Pager [phone]  05/22/2016,6:16 AM

## 2016-05-23 ENCOUNTER — Encounter (HOSPITAL_COMMUNITY): Payer: Self-pay | Admitting: Internal Medicine

## 2016-05-23 LAB — CBC WITH DIFFERENTIAL/PLATELET
Basophils Absolute: 0 10*3/uL (ref 0.0–0.1)
Basophils Relative: 0 %
EOS ABS: 0.1 10*3/uL (ref 0.0–0.7)
EOS PCT: 1 %
HCT: 47 % (ref 39.0–52.0)
HEMOGLOBIN: 16.1 g/dL (ref 13.0–17.0)
LYMPHS ABS: 0.8 10*3/uL (ref 0.7–4.0)
Lymphocytes Relative: 8 %
MCH: 29.8 pg (ref 26.0–34.0)
MCHC: 34.3 g/dL (ref 30.0–36.0)
MCV: 87 fL (ref 78.0–100.0)
MONO ABS: 1.3 10*3/uL — AB (ref 0.1–1.0)
MONOS PCT: 12 %
Neutro Abs: 8.1 10*3/uL — ABNORMAL HIGH (ref 1.7–7.7)
Neutrophils Relative %: 78 %
PLATELETS: 116 10*3/uL — AB (ref 150–400)
RBC: 5.4 MIL/uL (ref 4.22–5.81)
RDW: 14.1 % (ref 11.5–15.5)
WBC: 10.3 10*3/uL (ref 4.0–10.5)

## 2016-05-23 LAB — COMPREHENSIVE METABOLIC PANEL
ALBUMIN: 2.9 g/dL — AB (ref 3.5–5.0)
ALT: 30 U/L (ref 17–63)
AST: 30 U/L (ref 15–41)
Alkaline Phosphatase: 87 U/L (ref 38–126)
Anion gap: 14 (ref 5–15)
BILIRUBIN TOTAL: 1.7 mg/dL — AB (ref 0.3–1.2)
BUN: 51 mg/dL — AB (ref 6–20)
CHLORIDE: 68 mmol/L — AB (ref 101–111)
CO2: 44 mmol/L — AB (ref 22–32)
CREATININE: 2.9 mg/dL — AB (ref 0.61–1.24)
Calcium: 9.2 mg/dL (ref 8.9–10.3)
GFR calc Af Amer: 24 mL/min — ABNORMAL LOW (ref >60)
GFR, EST NON AFRICAN AMERICAN: 21 mL/min — AB (ref >60)
GLUCOSE: 139 mg/dL — AB (ref 65–99)
Potassium: 3.8 mmol/L (ref 3.5–5.1)
Sodium: 126 mmol/L — ABNORMAL LOW (ref 135–145)
Total Protein: 6.5 g/dL (ref 6.5–8.1)

## 2016-05-23 LAB — CARBOXYHEMOGLOBIN
CARBOXYHEMOGLOBIN: 1.1 % (ref 0.5–1.5)
Methemoglobin: 0.7 % (ref 0.0–1.5)
O2 SAT: 74 %
TOTAL HEMOGLOBIN: 16.7 g/dL (ref 13.5–18.0)

## 2016-05-23 LAB — GLUCOSE, CAPILLARY
GLUCOSE-CAPILLARY: 138 mg/dL — AB (ref 65–99)
GLUCOSE-CAPILLARY: 157 mg/dL — AB (ref 65–99)
Glucose-Capillary: 155 mg/dL — ABNORMAL HIGH (ref 65–99)
Glucose-Capillary: 148 mg/dL — ABNORMAL HIGH (ref 65–99)

## 2016-05-23 LAB — HEPARIN LEVEL (UNFRACTIONATED)
HEPARIN UNFRACTIONATED: 0.62 [IU]/mL (ref 0.30–0.70)
Heparin Unfractionated: 0.62 IU/mL (ref 0.30–0.70)

## 2016-05-23 MED ORDER — FUROSEMIDE 20 MG PO TABS: 60.0000 mg | ORAL_TABLET | Freq: Two times a day (BID) | ORAL | Status: DC

## 2016-05-23 MED ORDER — MILRINONE LACTATE IN DEXTROSE 20-5 MG/100ML-% IV SOLN
0.1250 ug/kg/min | INTRAVENOUS | Status: DC
  Administered 2016-05-23: 0.25 ug/kg/min via INTRAVENOUS
  Administered 2016-05-24: 0.125 ug/kg/min via INTRAVENOUS
  Filled 2016-05-23 (×2): qty 100

## 2016-05-23 MED ORDER — SODIUM CHLORIDE 0.9% FLUSH: 3.0000 mL | Freq: Two times a day (BID) | INTRAVENOUS | Status: DC

## 2016-05-23 MED ORDER — SODIUM CHLORIDE 0.9 % IV SOLN: 250.0000 mL | INTRAVENOUS | Status: DC

## 2016-05-23 MED ORDER — SORBITOL 70 % SOLN
30.0000 mL | Freq: Every day | Status: DC | PRN
  Administered 2016-05-23 – 2016-05-24 (×2): 30 mL via ORAL
  Filled 2016-05-23 (×2): qty 30

## 2016-05-23 MED ORDER — SODIUM CHLORIDE 0.9% FLUSH
3.0000 mL | INTRAVENOUS | Status: DC | PRN
Start: 2016-05-23 — End: 2016-05-23

## 2016-05-23 MED ORDER — HEPARIN (PORCINE) IN NACL 100-0.45 UNIT/ML-% IJ SOLN
900.0000 [IU]/h | INTRAMUSCULAR | Status: AC
  Administered 2016-05-24 – 2016-05-25 (×2): 900 [IU]/h via INTRAVENOUS
  Filled 2016-05-23 (×2): qty 250

## 2016-05-23 NOTE — Progress Notes (Signed)
ANTICOAGULATION CONSULT NOTE  Pharmacy Consult for heparin Indication: atrial fibrillation  Allergies  Allergen Reactions  . Amiodarone Rash    Rash resolved when amio stopped 2013    Patient Measurements: Height: 5\' 5"  (165.1 cm) Weight: 191 lb 12.8 oz (87 kg) IBW/kg (Calculated) : 61.5 Heparin Dosing Weight: 80.7 kg  Vital Signs: Temp: 98.1 F (36.7 C) (05/28 2334) Temp Source: Oral (05/28 2334) BP: 94/77 mmHg (05/28 2300) Pulse Rate: 71 (05/28 2300)  Labs:  Recent Labs  05/20/16 0400  05/21/16 0315  05/22/16 0520 05/22/16 1440 05/22/16 2330  HGB 13.5  --  14.7  --  15.8  --   --   HCT 40.5  --  43.6  --  46.2  --   --   PLT 97*  --  100*  --  115*  --   --   HEPARINUNFRC 0.85*  < >  --   < > 0.26* 0.28* 0.50  CREATININE 2.84*  --  2.44*  --  2.63*  --   --   < > = values in this interval not displayed.  Estimated Creatinine Clearance: 27.6 mL/min (by C-G formula based on Cr of 2.63).   Medications:  Prescriptions prior to admission  Medication Sig Dispense Refill Last Dose  . carvedilol (COREG) 6.25 MG tablet Take 1 tablet (6.25 mg total) by mouth 2 (two) times daily with a meal. 60 tablet 6 Past Week at Unknown time  . furosemide (LASIX) 40 MG tablet Take 1 tab in AM and 1/2 tab in PM 30 tablet 6 05/16/2016 at Unknown time    Assessment: 70 y.o. male with Afib for heparin  Goal of Therapy:  Heparin level 0.3-0.5 units/ml  Monitor platelets by anticoagulation protocol: Yes   Plan:  Continue Heparin at current rate  Follow-up am labs.  Geannie Risen, PharmD, BCPS   05/23/2016,12:01 AM

## 2016-05-23 NOTE — Progress Notes (Signed)
ANTICOAGULATION CONSULT NOTE - Follow Up Consult  Pharmacy Consult for heparin Indication: atrial fibrillation  Allergies  Allergen Reactions  . Amiodarone Rash    Rash resolved when amio stopped 2013    Patient Measurements: Height: 5\' 5"  (165.1 cm) Weight: 187 lb 2.7 oz (84.9 kg) IBW/kg (Calculated) : 61.5 Heparin Dosing Weight: 80.7 kg  Vital Signs: Temp: 97.4 F (36.3 C) (05/29 1600) Temp Source: Oral (05/29 1600) BP: 97/67 mmHg (05/29 1600) Pulse Rate: 68 (05/29 1600)  Labs:  Recent Labs  05/21/16 0315  05/22/16 0520  05/22/16 2330 05/23/16 0450 05/23/16 0514 05/23/16 1551  HGB 14.7  --  15.8  --   --   --  16.1  --   HCT 43.6  --  46.2  --   --   --  47.0  --   PLT 100*  --  115*  --   --   --  116*  --   HEPARINUNFRC  --   < > 0.26*  < > 0.50 0.62  --  0.62  CREATININE 2.44*  --  2.63*  --   --   --  2.90*  --   < > = values in this interval not displayed.  Estimated Creatinine Clearance: 24.8 mL/min (by C-G formula based on Cr of 2.9).   Medications:  Prescriptions prior to admission  Medication Sig Dispense Refill Last Dose  . carvedilol (COREG) 6.25 MG tablet Take 1 tablet (6.25 mg total) by mouth 2 (two) times daily with a meal. 60 tablet 6 Past Week at Unknown time  . furosemide (LASIX) 40 MG tablet Take 1 tab in AM and 1/2 tab in PM 30 tablet 6 05/16/2016 at Unknown time    Assessment: 70 yo M admitted 05/17/2016 with a history of Afib not on anticoagulation d/t rectus sheath hematoma in 2013 and resistant to restart now. Patient stopped medications 3 weeks prior to admission and now in acute decompensated HF and Afib with RVR. Hematuria on 5/24 heparin held but improved on 5/25 so heparin resumed.  HL 0.62, just slightly above stated goal. No further s/sx of bleeding noted.  Planning repeat DCCV tomorrow.  Goal of Therapy:  Heparin level 0.3-0.5 units/ml - given history of bleeding Monitor platelets by anticoagulation protocol: Yes   Plan:  -  Decrease IV heparin to 900 units/hr. - Recheck heparin level in 8 hrs. -Daily heparin level and CBC.  Tad Moore, BCPS  Clinical Pharmacist Pager [phone]  05/23/2016 4:32 PM

## 2016-05-23 NOTE — Progress Notes (Signed)
Patient ID: Martin [name], male   DOB: 1946/06/08, 70 y.o.   MRN: [id number]     Advanced Heart Failure Rounding Note  Referring Physician: Dr Diona Browner Primary Cardiologist: Dr. Gala Romney   Subjective:    Presented 05/18/16 after transfer from [facility] with cardiogenic shock. Placed on milrinone, norepi, and amio drip for afib.    Failed DC-CV 5/26 despite amiodarone. Ranexa added.   TEE 5/26 with EF 15-20% and severe RV dysfunction. LA 5.6 cm  Remains on milrinone at 0.375. Remains in AF rate 110s. Coox 67->59%->75%.   Diuresing well on lasix gtt at 15. Weight down another 4 pounds. CVP 3.  Denies dyspnea. Creatinine up again 2.4 -> 2.6 -> 2.9.     Objective:   Weight Range: 187 lb 2.7 oz (84.9 kg) Body mass index is 31.15 kg/(m^2).   Vital Signs:   Temp:  [97.9 F (36.6 C)-98.5 F (36.9 C)] 98.2 F (36.8 C) (05/29 0800) Pulse Rate:  [41-147] 57 (05/29 0800) Resp:  [14-28] 28 (05/29 0800) BP: (89-129)/(68-117) 114/75 mmHg (05/29 0800) SpO2:  [51 %-100 %] 96 % (05/29 0800) Weight:  [187 lb 2.7 oz (84.9 kg)] 187 lb 2.7 oz (84.9 kg) (05/29 0444) Last BM Date: 05/21/16  Weight change: Filed Weights   05/21/16 0400 05/22/16 0410 05/23/16 0444  Weight: 197 lb 15.6 oz (89.8 kg) 191 lb 12.8 oz (87 kg) 187 lb 2.7 oz (84.9 kg)    Intake/Output:   Intake/Output Summary (Last 24 hours) at 05/23/16 0847 Last data filed at 05/23/16 0800  Gross per 24 hour  Intake 2863.45 ml  Output   6450 ml  Net -3586.55 ml     Physical Exam: CVP 3 General: Sitting in chair. NAD HEENT: Normal except for poor dentition Neck: supple. JVP 6. Carotids 2+ bilat; no bruits. No thyromegaly or nodule noted.  Cor: Irregularly irregular rate, tachy. +S3.  Lungs: clear anteriorly  Abdomen: soft, NT, mild/mod distended no HSM. No bruits or masses. +BS  + mass in R adbomen Extremities: no cyanosis, clubbing, rash. Still 2+ ankle edema with unna boots on. Neuro: alert & orientedx3, cranial nerves  grossly intact. moves all 4 extremities w/o difficulty. Affect pleasant  Telemetry: Afib RVR 110s  Labs: CBC  Recent Labs  05/22/16 0520 05/23/16 0514  WBC 8.7 10.3  NEUTROABS 6.9 8.1*  HGB 15.8 16.1  HCT 46.2 47.0  MCV 86.7 87.0  PLT 115* 116*   Basic Metabolic Panel  Recent Labs  05/21/16 0315 05/22/16 0520 05/23/16 0514  NA 131* 130* 126*  K 3.0* 3.7 3.8  CL 82* 74* 68*  CO2 38* 44* 44*  GLUCOSE 131* 148* 139*  BUN 41* 46* 51*  CREATININE 2.44* 2.63* 2.90*  CALCIUM 8.9 9.3 9.2  MG 1.7  --   --    Liver Function Tests  Recent Labs  05/22/16 0520 05/23/16 0514  AST 30 30  ALT 39 30  ALKPHOS 89 87  BILITOT 1.3* 1.7*  PROT 6.3* 6.5  ALBUMIN 3.0* 2.9*   No results for input(s): LIPASE, AMYLASE in the last 72 hours. Cardiac Enzymes No results for input(s): CKTOTAL, CKMB, CKMBINDEX, TROPONINI in the last 72 hours.  BNP: BNP (last 3 results)  Recent Labs  05/17/16 1250  BNP 905.0*    ProBNP (last 3 results) No results for input(s): PROBNP in the last 8760 hours.   D-Dimer No results for input(s): DDIMER in the last 72 hours. Hemoglobin A1C No results for input(s): HGBA1C in the last  72 hours. Fasting Lipid Panel No results for input(s): CHOL, HDL, LDLCALC, TRIG, CHOLHDL, LDLDIRECT in the last 72 hours. Thyroid Function Tests No results for input(s): TSH, T4TOTAL, T3FREE, THYROIDAB in the last 72 hours.  Invalid input(s): FREET3  Other results:  Imaging/Studies:  No results found.   Medications:     Scheduled Medications: . antiseptic oral rinse  7 mL Mouth Rinse BID  . aspirin EC  81 mg Oral Daily  . atorvastatin  80 mg Oral q1800  . [START ON 05/24/2016] furosemide  60 mg Oral BID  . insulin aspart  0-5 Units Subcutaneous QHS  . insulin aspart  0-9 Units Subcutaneous TID WC  . potassium chloride  40 mEq Oral BID  . ranolazine  500 mg Oral BID  . sodium chloride flush  10-40 mL Intracatheter Q12H  . sodium chloride flush  3 mL  Intravenous Q12H  . sodium chloride flush  3 mL Intravenous Q12H  . sodium chloride flush  3 mL Intravenous Q12H    Infusions: . sodium chloride    . sodium chloride    . amiodarone 60 mg/hr (05/23/16 0800)  . heparin 950 Units/hr (05/23/16 0800)  . milrinone      PRN Medications: sodium chloride, acetaminophen, ondansetron (ZOFRAN) IV, sodium chloride flush, sodium chloride flush, sodium chloride flush, sodium chloride flush   Assessment   1. Cardiogenic Shock 2 Acute on chronic systolic HF in the setting of Afib RVR 3. Afib with hx of tachycardia mediated CMP This patients CHA2DS2-VASc Score and unadjusted Ischemic Stroke Rate (% per year) is equal to 4.8 % stroke rate/year from a score of 4 (CHF, HTN, age, vascular disease) 4. AKI on CKD stage 3 5. Hx of rectus sheath hematoma on coumadin 2013 - refuses anticoagulation 6. HTN 7. Hyperkalemia  8. Hypokalemia  Plan    Remains in AF with RVR despite amiodarone + Ranexa.  Failed DC-CV on 5/26.  Will continue amio/heparin. Plan repeat DC-CV on Tuesday, will keep NPO. If fails, will need to consider AVN ablation and BIV pacer.  Co-ox 75% today.  Decrease milrinone to 0.25 today.  Hopefully will improve further if we can get him back to NSR.   CVP 3 today and creatinine up.  Stop IV Lasix today, start po tomorrow.   Ideally will need cath with Trop > 30 but renal function prohibitive at this point (rising to 2.9).   35 minutes critical care time.   Length of Stay: 6 [name] MD 05/23/2016, 8:47 AM  Advanced Heart Failure Team Pager [phone] (M-F; 7a - 4p)  Please contact [facility] for night-coverage after hours (4p -7a ) and weekends on [email or web address]

## 2016-05-23 NOTE — Progress Notes (Signed)
ANTICOAGULATION CONSULT NOTE - Follow Up Consult  Pharmacy Consult for heparin Indication: atrial fibrillation  Allergies  Allergen Reactions  . Amiodarone Rash    Rash resolved when amio stopped 2013    Patient Measurements: Height: 5\' 5"  (165.1 cm) Weight: 187 lb 2.7 oz (84.9 kg) IBW/kg (Calculated) : 61.5 Heparin Dosing Weight: 80.7 kg  Vital Signs: Temp: 98.2 F (36.8 C) (05/29 0400) Temp Source: Oral (05/28 2334) BP: 107/69 mmHg (05/29 0700) Pulse Rate: 55 (05/29 0700)  Labs:  Recent Labs  05/21/16 0315  05/22/16 0520 05/22/16 1440 05/22/16 2330 05/23/16 0450 05/23/16 0514  HGB 14.7  --  15.8  --   --   --  16.1  HCT 43.6  --  46.2  --   --   --  47.0  PLT 100*  --  115*  --   --   --  116*  HEPARINUNFRC  --   < > 0.26* 0.28* 0.50 0.62  --   CREATININE 2.44*  --  2.63*  --   --   --  2.90*  < > = values in this interval not displayed.  Estimated Creatinine Clearance: 24.8 mL/min (by C-G formula based on Cr of 2.9).   Medications:  Prescriptions prior to admission  Medication Sig Dispense Refill Last Dose  . carvedilol (COREG) 6.25 MG tablet Take 1 tablet (6.25 mg total) by mouth 2 (two) times daily with a meal. 60 tablet 6 Past Week at Unknown time  . furosemide (LASIX) 40 MG tablet Take 1 tab in AM and 1/2 tab in PM 30 tablet 6 05/16/2016 at Unknown time    Assessment: 70 yo M admitted 05/17/2016 with a history of Afib not on anticoagulation d/t rectus sheath hematoma in 2013 and resistant to restart now. Patient stopped medications 3 weeks prior to admission and now in acute decompensated HF and Afib with RVR. Hematuria on 5/24 heparin held but improved on 5/25 so heparin resumed.  HL 0.62 (supra-therapeutic), Hgb 16.1, Plt 116. No further s/sx of bleeding noted.  Goal of Therapy:  Heparin level 0.3-0.5 units/ml - given history of bleeding Monitor platelets by anticoagulation protocol: Yes   Plan:  - Decrease heparin to 950 units/hr - Monitor 8 hr  HL - Monitor daily HL, CBC and s/sx of bleeding  Casilda Carls, PharmD. PGY-1 Pharmacy Resident Pager: [phone] 05/23/2016,7:25 AM

## 2016-05-24 LAB — COMPREHENSIVE METABOLIC PANEL
ALBUMIN: 2.7 g/dL — AB (ref 3.5–5.0)
ALK PHOS: 84 U/L (ref 38–126)
ALT: 29 U/L (ref 17–63)
ANION GAP: 12 (ref 5–15)
AST: 29 U/L (ref 15–41)
BILIRUBIN TOTAL: 1.3 mg/dL — AB (ref 0.3–1.2)
BUN: 54 mg/dL — ABNORMAL HIGH (ref 6–20)
CALCIUM: 8.9 mg/dL (ref 8.9–10.3)
CO2: 43 mmol/L — ABNORMAL HIGH (ref 22–32)
Chloride: 70 mmol/L — ABNORMAL LOW (ref 101–111)
Creatinine, Ser: 2.69 mg/dL — ABNORMAL HIGH (ref 0.61–1.24)
GFR calc Af Amer: 27 mL/min — ABNORMAL LOW (ref >60)
GFR, EST NON AFRICAN AMERICAN: 23 mL/min — AB (ref >60)
GLUCOSE: 133 mg/dL — AB (ref 65–99)
Potassium: 3.9 mmol/L (ref 3.5–5.1)
Sodium: 125 mmol/L — ABNORMAL LOW (ref 135–145)
TOTAL PROTEIN: 6.3 g/dL — AB (ref 6.5–8.1)

## 2016-05-24 LAB — CBC WITH DIFFERENTIAL/PLATELET
BASOS ABS: 0 10*3/uL (ref 0.0–0.1)
BASOS PCT: 0 %
EOS PCT: 2 %
Eosinophils Absolute: 0.2 10*3/uL (ref 0.0–0.7)
HEMATOCRIT: 46.5 % (ref 39.0–52.0)
Hemoglobin: 15.7 g/dL (ref 13.0–17.0)
Lymphocytes Relative: 7 %
Lymphs Abs: 0.8 10*3/uL (ref 0.7–4.0)
MCH: 29.5 pg (ref 26.0–34.0)
MCHC: 33.8 g/dL (ref 30.0–36.0)
MCV: 87.2 fL (ref 78.0–100.0)
MONO ABS: 1.5 10*3/uL — AB (ref 0.1–1.0)
Monocytes Relative: 13 %
NEUTROS ABS: 8.5 10*3/uL — AB (ref 1.7–7.7)
Neutrophils Relative %: 78 %
PLATELETS: 119 10*3/uL — AB (ref 150–400)
RBC: 5.33 MIL/uL (ref 4.22–5.81)
RDW: 14 % (ref 11.5–15.5)
WBC: 11 10*3/uL — AB (ref 4.0–10.5)

## 2016-05-24 LAB — GLUCOSE, CAPILLARY
GLUCOSE-CAPILLARY: 142 mg/dL — AB (ref 65–99)
Glucose-Capillary: 136 mg/dL — ABNORMAL HIGH (ref 65–99)
Glucose-Capillary: 173 mg/dL — ABNORMAL HIGH (ref 65–99)
Glucose-Capillary: 138 mg/dL — ABNORMAL HIGH (ref 65–99)

## 2016-05-24 LAB — CARBOXYHEMOGLOBIN
Carboxyhemoglobin: 0.8 % (ref 0.5–1.5)
Methemoglobin: 0.8 % (ref 0.0–1.5)
O2 SAT: 64.1 %
TOTAL HEMOGLOBIN: 16.8 g/dL (ref 13.5–18.0)

## 2016-05-24 LAB — HEPARIN LEVEL (UNFRACTIONATED)
HEPARIN UNFRACTIONATED: 0.49 [IU]/mL (ref 0.30–0.70)
Heparin Unfractionated: 0.5 IU/mL (ref 0.30–0.70)

## 2016-05-24 NOTE — Progress Notes (Signed)
Patient ID: Martin [name], male   DOB: 09-21-1946, 70 y.o.   MRN: [id number]     Advanced Heart Failure Rounding Note  Referring Physician: Dr Diona Browner Primary Cardiologist: Dr. Gala Romney   Subjective:    Presented 05/18/16 after transfer from [facility] with cardiogenic shock. Placed on milrinone, norepi, and amio drip for afib.    Failed DC-CV 5/26 despite amiodarone. Ranexa added.   TEE 5/26 with EF 15-20% and severe RV dysfunction. LA 5.6 cm  Yesterday milrinone was cut back 0.125 mcg. Also diuretics held. Creatinine 2.9.>2.6    Denies SOB/CP   Objective:   Weight Range: 186 lb 1.1 oz (84.4 kg) Body mass index is 30.96 kg/(m^2).   Vital Signs:   Temp:  [97.4 F (36.3 C)-98.2 F (36.8 C)] 97.7 F (36.5 C) (05/30 0400) Pulse Rate:  [36-117] 101 (05/30 0700) Resp:  [17-32] 26 (05/30 0700) BP: (81-121)/(63-90) 96/78 mmHg (05/30 0700) SpO2:  [93 %-100 %] 95 % (05/30 0700) Weight:  [186 lb 1.1 oz (84.4 kg)] 186 lb 1.1 oz (84.4 kg) (05/30 0600) Last BM Date: 05/21/16  Weight change: Filed Weights   05/22/16 0410 05/23/16 0444 05/24/16 0600  Weight: 191 lb 12.8 oz (87 kg) 187 lb 2.7 oz (84.9 kg) 186 lb 1.1 oz (84.4 kg)    Intake/Output:   Intake/Output Summary (Last 24 hours) at 05/24/16 0754 Last data filed at 05/24/16 0700  Gross per 24 hour  Intake 2128.62 ml  Output   3300 ml  Net -1171.38 ml     Physical Exam: CVP 2 General: In bed.  NAD HEENT: Normal except for poor dentition Neck: supple. JVP 6. Carotids 2+ bilat; no bruits. No thyromegaly or nodule noted.  Cor: Irregularly irregular rate, tachy. +S3.  Lungs: clear anteriorly  Abdomen: soft, NT, mild/mod distended no HSM. No bruits or masses. +BS  + mass in R adbomen Extremities: no cyanosis, clubbing, rash. Still 2+ ankle edema with unna boots on. Neuro: alert & orientedx3, cranial nerves grossly intact. moves all 4 extremities w/o difficulty. Affect pleasant  Telemetry: Afib  100s  Labs: CBC  Recent Labs  05/23/16 0514 05/24/16 0315  WBC 10.3 11.0*  NEUTROABS 8.1* 8.5*  HGB 16.1 15.7  HCT 47.0 46.5  MCV 87.0 87.2  PLT 116* 119*   Basic Metabolic Panel  Recent Labs  05/23/16 0514 05/24/16 0315  NA 126* 125*  K 3.8 3.9  CL 68* 70*  CO2 44* 43*  GLUCOSE 139* 133*  BUN 51* 54*  CREATININE 2.90* 2.69*  CALCIUM 9.2 8.9   Liver Function Tests  Recent Labs  05/23/16 0514 05/24/16 0315  AST 30 29  ALT 30 29  ALKPHOS 87 84  BILITOT 1.7* 1.3*  PROT 6.5 6.3*  ALBUMIN 2.9* 2.7*   No results for input(s): LIPASE, AMYLASE in the last 72 hours. Cardiac Enzymes No results for input(s): CKTOTAL, CKMB, CKMBINDEX, TROPONINI in the last 72 hours.  BNP: BNP (last 3 results)  Recent Labs  05/17/16 1250  BNP 905.0*    ProBNP (last 3 results) No results for input(s): PROBNP in the last 8760 hours.   D-Dimer No results for input(s): DDIMER in the last 72 hours. Hemoglobin A1C No results for input(s): HGBA1C in the last 72 hours. Fasting Lipid Panel No results for input(s): CHOL, HDL, LDLCALC, TRIG, CHOLHDL, LDLDIRECT in the last 72 hours. Thyroid Function Tests No results for input(s): TSH, T4TOTAL, T3FREE, THYROIDAB in the last 72 hours.  Invalid input(s): FREET3  Other results:  Imaging/Studies:  No results found.   Medications:     Scheduled Medications: . antiseptic oral rinse  7 mL Mouth Rinse BID  . aspirin EC  81 mg Oral Daily  . atorvastatin  80 mg Oral q1800  . furosemide  60 mg Oral BID  . insulin aspart  0-5 Units Subcutaneous QHS  . insulin aspart  0-9 Units Subcutaneous TID WC  . potassium chloride  40 mEq Oral BID  . ranolazine  500 mg Oral BID  . sodium chloride flush  10-40 mL Intracatheter Q12H  . sodium chloride flush  3 mL Intravenous Q12H    Infusions: . sodium chloride    . amiodarone 60 mg/hr (05/23/16 2116)  . heparin 900 Units/hr (05/23/16 2000)  . milrinone 0.25 mcg/kg/min (05/23/16 2116)     PRN Medications: sodium chloride, acetaminophen, ondansetron (ZOFRAN) IV, sodium chloride flush, sodium chloride flush, sorbitol   Assessment   1. Cardiogenic Shock 2 Acute on chronic systolic HF in the setting of Afib RVR 3. Afib with hx of tachycardia mediated CMP This patients CHA2DS2-VASc Score and unadjusted Ischemic Stroke Rate (% per year) is equal to 4.8 % stroke rate/year from a score of 4 (CHF, HTN, age, vascular disease) 4. AKI on CKD stage 3 5. Hx of rectus sheath hematoma on coumadin 2013 - refuses anticoagulation 6. HTN 7. Hyperkalemia  8. Hypokalemia  Plan    Remains in AF with RVR despite amiodarone + Ranexa.  Failed DC-CV on 5/26.  Will continue amio/heparin. Plan repeat DC-CV today. NPO. If fails, will need to consider AVN ablation and BIV pacer.  Co-ox 64% today.  Decrease milrinone to 0.125 mcg. Hopefully will improve further if we can get him back to NSR. On heparin.   Ideally will need cath with Trop > 30 but renal function prohibitive at this point (rising to 2.9).   Length of Stay: 7 [name] NP-C  05/24/2016, 7:54 AM  Advanced Heart Failure Team Pager [phone] (M-F; 7a - 4p)  Please contact [facility] for night-coverage after hours (4p -7a ) and weekends on [email or web address]  Patient seen and examined with [name], NP. We discussed all aspects of the encounter. I agree with the assessment and plan as stated above.   Volume status much better. Can stop lasix. Co-ox good. Will wean milrinone. Renal function stable but too high for cath. Still in AF. Will plan DC-CV today. Continue heparin, amio and Ranexa. Hopefully can go to SDU soon.  ,sig   

## 2016-05-24 NOTE — Progress Notes (Signed)
ANTICOAGULATION CONSULT NOTE  Pharmacy Consult for heparin Indication: atrial fibrillation  Allergies  Allergen Reactions  . Amiodarone Rash    Rash resolved when amio stopped 2013    Patient Measurements: Height: 5\' 5"  (165.1 cm) Weight: 186 lb 1.1 oz (84.4 kg) IBW/kg (Calculated) : 61.5 Heparin Dosing Weight: 80.7 kg  Vital Signs: Temp: 97.7 F (36.5 C) (05/30 0400) Temp Source: Oral (05/30 0400) BP: 96/78 mmHg (05/30 0700) Pulse Rate: 101 (05/30 0700)  Labs:  Recent Labs  05/22/16 0520  05/23/16 0514 05/23/16 1551 05/24/16 0001 05/24/16 0315 05/24/16 0515  HGB 15.8  --  16.1  --   --  15.7  --   HCT 46.2  --  47.0  --   --  46.5  --   PLT 115*  --  116*  --   --  119*  --   HEPARINUNFRC 0.26*  < >  --  0.62 0.49  --  0.50  CREATININE 2.63*  --  2.90*  --   --  2.69*  --   < > = values in this interval not displayed.  Estimated Creatinine Clearance: 26.6 mL/min (by C-G formula based on Cr of 2.69).  Assessment: 70 y.o. male with Afib awaiting DCCV for heparin.  Heparin level at goal overnight, no changes needed, no bleeding issues noted.  Goal of Therapy:  Heparin level 0.3-0.5 units/ml  Monitor platelets by anticoagulation protocol: Yes   Plan:  Continue Heparin at current rate   Sheppard Coil PharmD., BCPS Clinical Pharmacist Pager [phone] 05/24/2016 7:58 AM

## 2016-05-24 NOTE — Progress Notes (Signed)
ANTICOAGULATION CONSULT NOTE  Pharmacy Consult for heparin Indication: atrial fibrillation  Allergies  Allergen Reactions  . Amiodarone Rash    Rash resolved when amio stopped 2013    Patient Measurements: Height: 5\' 5"  (165.1 cm) Weight: 187 lb 2.7 oz (84.9 kg) IBW/kg (Calculated) : 61.5 Heparin Dosing Weight: 80.7 kg  Vital Signs: Temp: 97.8 F (36.6 C) (05/29 2000) Temp Source: Oral (05/29 2000) BP: 94/71 mmHg (05/29 2200) Pulse Rate: 106 (05/29 2200)  Labs:  Recent Labs  05/21/16 0315  05/22/16 0520  05/23/16 0450 05/23/16 0514 05/23/16 1551 05/24/16 0001  HGB 14.7  --  15.8  --   --  16.1  --   --   HCT 43.6  --  46.2  --   --  47.0  --   --   PLT 100*  --  115*  --   --  116*  --   --   HEPARINUNFRC  --   < > 0.26*  < > 0.62  --  0.62 0.49  CREATININE 2.44*  --  2.63*  --   --  2.90*  --   --   < > = values in this interval not displayed.  Estimated Creatinine Clearance: 24.8 mL/min (by C-G formula based on Cr of 2.9).  Assessment: 70 y.o. male with Afib awaiting DCCV for heparin  Goal of Therapy:  Heparin level 0.3-0.5 units/ml  Monitor platelets by anticoagulation protocol: Yes   Plan:  Continue Heparin at current rate   Geannie Risen, PharmD, BCPS   05/24/2016 1:09 AM

## 2016-05-24 NOTE — Care Management Important Message (Signed)
Important Message  Patient Details  Name: Martin [name] MRN: [id number] Date of Birth: Feb 20, 1946   Medicare Important Message Given:  Yes    Bernadette Hoit 05/24/2016, 7:54 AM

## 2016-05-25 ENCOUNTER — Encounter (HOSPITAL_COMMUNITY): Payer: Self-pay | Admitting: *Deleted

## 2016-05-25 ENCOUNTER — Inpatient Hospital Stay (HOSPITAL_COMMUNITY): Payer: Medicare Other | Admitting: Certified Registered"

## 2016-05-25 ENCOUNTER — Encounter (HOSPITAL_COMMUNITY)
Admission: EM | Disposition: A | Discharge status: Home / Self Care | Payer: Self-pay | Source: Home / Self Care | Attending: Internal Medicine

## 2016-05-25 HISTORY — PX: CARDIOVERSION: SHX1299

## 2016-05-25 LAB — BASIC METABOLIC PANEL
ANION GAP: 10 (ref 5–15)
BUN: 47 mg/dL — AB (ref 6–20)
CHLORIDE: 78 mmol/L — AB (ref 101–111)
CO2: 39 mmol/L — ABNORMAL HIGH (ref 22–32)
Calcium: 8.8 mg/dL — ABNORMAL LOW (ref 8.9–10.3)
Creatinine, Ser: 2.32 mg/dL — ABNORMAL HIGH (ref 0.61–1.24)
GFR calc Af Amer: 32 mL/min — ABNORMAL LOW (ref >60)
GFR calc non Af Amer: 27 mL/min — ABNORMAL LOW (ref >60)
GLUCOSE: 123 mg/dL — AB (ref 65–99)
POTASSIUM: 4.6 mmol/L (ref 3.5–5.1)
Sodium: 127 mmol/L — ABNORMAL LOW (ref 135–145)

## 2016-05-25 LAB — GLUCOSE, CAPILLARY
GLUCOSE-CAPILLARY: 133 mg/dL — AB (ref 65–99)
Glucose-Capillary: 119 mg/dL — ABNORMAL HIGH (ref 65–99)
Glucose-Capillary: 172 mg/dL — ABNORMAL HIGH (ref 65–99)
Glucose-Capillary: 115 mg/dL — ABNORMAL HIGH (ref 65–99)

## 2016-05-25 LAB — CBC WITH DIFFERENTIAL/PLATELET
Basophils Absolute: 0 10*3/uL (ref 0.0–0.1)
Basophils Relative: 0 %
EOS ABS: 0.5 10*3/uL (ref 0.0–0.7)
Eosinophils Relative: 5 %
HEMATOCRIT: 45.2 % (ref 39.0–52.0)
HEMOGLOBIN: 15.6 g/dL (ref 13.0–17.0)
LYMPHS ABS: 0.9 10*3/uL (ref 0.7–4.0)
LYMPHS PCT: 9 %
MCH: 29.9 pg (ref 26.0–34.0)
MCHC: 34.5 g/dL (ref 30.0–36.0)
MCV: 86.6 fL (ref 78.0–100.0)
Monocytes Absolute: 1.3 10*3/uL — ABNORMAL HIGH (ref 0.1–1.0)
Monocytes Relative: 13 %
NEUTROS PCT: 73 %
Neutro Abs: 7.1 10*3/uL (ref 1.7–7.7)
Platelets: 128 10*3/uL — ABNORMAL LOW (ref 150–400)
RBC: 5.22 MIL/uL (ref 4.22–5.81)
RDW: 13.9 % (ref 11.5–15.5)
WBC: 9.7 10*3/uL (ref 4.0–10.5)

## 2016-05-25 LAB — HEPARIN LEVEL (UNFRACTIONATED): Heparin Unfractionated: 0.54 IU/mL (ref 0.30–0.70)

## 2016-05-25 LAB — CARBOXYHEMOGLOBIN
Carboxyhemoglobin: 1 % (ref 0.5–1.5)
Methemoglobin: 0.8 % (ref 0.0–1.5)
O2 Saturation: 58.4 %
Total hemoglobin: 16.3 g/dL (ref 13.5–18.0)

## 2016-05-25 SURGERY — CARDIOVERSION
Anesthesia: Monitor Anesthesia Care

## 2016-05-25 MED ORDER — ETOMIDATE 2 MG/ML IV SOLN
INTRAVENOUS | Status: DC | PRN
  Administered 2016-05-25: 8 mg via INTRAVENOUS

## 2016-05-25 MED ORDER — AMIODARONE HCL IN DEXTROSE 360-4.14 MG/200ML-% IV SOLN
60.0000 mg/h | INTRAVENOUS | Status: AC
  Administered 2016-05-25: 60 mg/h via INTRAVENOUS
  Filled 2016-05-25: qty 200

## 2016-05-25 NOTE — CV Procedure (Signed)
    Cardioversion Note  Martin [name] [id number] 1946/06/11  Procedure: DC Cardioversion Indications: atrial fib  Procedure Details Consent: Obtained Time Out: Verified patient identification, verified procedure, site/side was marked, verified correct patient position, special equipment/implants available, Radiology Safety Procedures followed,  medications/allergies/relevent history reviewed, required imaging and test results available.  Performed  The patient has been on adequate anticoagulation.  The patient received Etomidate 8 mg IV  for sedation.  Synchronous cardioversion was performed at 120  joules.  The cardioversion was successful.     Complications: No apparent complications Patient did tolerate procedure well.   Vesta Mixer, Montez Hageman., MD, [facility] 05/25/2016, 12:40 PM

## 2016-05-25 NOTE — Anesthesia Preprocedure Evaluation (Signed)
Anesthesia Evaluation  Patient identified by MRN, date of birth, ID band Patient awake    Reviewed: Allergy & Precautions, NPO status , Patient's Chart, lab work & pertinent test results, reviewed documented beta blocker date and time   History of Anesthesia Complications Negative for: history of anesthetic complications  Airway Mallampati: II  TM Distance: >3 FB Neck ROM: Full    Dental  (+) Poor Dentition, Loose, Dental Advisory Given, Missing, Chipped   Pulmonary sleep apnea (assumed) ,    breath sounds clear to auscultation       Cardiovascular hypertension, Pt. on medications and Pt. on home beta blockers +CHF (recent cardiogenic shock, still on milrinone)  + dysrhythmias Atrial Fibrillation  Rhythm:Irregular Rate:Tachycardia     Neuro/Psych negative neurological ROS     GI/Hepatic negative GI ROS, Neg liver ROS,   Endo/Other  negative endocrine ROSDiabetes: glu 131.  Renal/GU CRFRenal disease     Musculoskeletal   Abdominal (+) + obese,   Peds  Hematology  (+) anemia ,   Anesthesia Other Findings EF 20-25%.   Reproductive/Obstetrics                             Lab Results  Component Value Date   WBC 9.7 05/25/2016   HGB 15.6 05/25/2016   HCT 45.2 05/25/2016   MCV 86.6 05/25/2016   PLT 128* 05/25/2016   Lab Results  Component Value Date   CREATININE 2.32* 05/25/2016   BUN 47* 05/25/2016   NA 127* 05/25/2016   K 4.6 05/25/2016   CL 78* 05/25/2016   CO2 39* 05/25/2016    Anesthesia Physical  Anesthesia Plan  ASA: IV  Anesthesia Plan: MAC   Post-op Pain Management:    Induction: Intravenous  Airway Management Planned: Natural Airway and Simple Face Mask  Additional Equipment:   Intra-op Plan:   Post-operative Plan: Possible Post-op intubation/ventilation  Informed Consent: I have reviewed the patients History and Physical, chart, labs and discussed the  procedure including the risks, benefits and alternatives for the proposed anesthesia with the patient or authorized representative who has indicated his/her understanding and acceptance.   Dental advisory given  Plan Discussed with: CRNA  Anesthesia Plan Comments:         Anesthesia Quick Evaluation

## 2016-05-25 NOTE — Progress Notes (Signed)
ANTICOAGULATION CONSULT NOTE  Pharmacy Consult for heparin Indication: atrial fibrillation  Allergies  Allergen Reactions  . Amiodarone Rash    Rash resolved when amio stopped 2013    Patient Measurements: Height: 5\' 5"  (165.1 cm) Weight: 184 lb 11.9 oz (83.8 kg) IBW/kg (Calculated) : 61.5 Heparin Dosing Weight: 80.7 kg  Vital Signs: Temp: 97.6 F (36.4 C) (05/31 0356) Temp Source: Oral (05/31 0356) BP: 97/72 mmHg (05/31 0700) Pulse Rate: 93 (05/31 0700)  Labs:  Recent Labs  05/23/16 0514  05/24/16 0001 05/24/16 0315 05/24/16 0515 05/25/16 0327  HGB 16.1  --   --  15.7  --  15.6  HCT 47.0  --   --  46.5  --  45.2  PLT 116*  --   --  119*  --  128*  HEPARINUNFRC  --   < > 0.49  --  0.50 0.54  CREATININE 2.90*  --   --  2.69*  --  2.32*  < > = values in this interval not displayed.  Estimated Creatinine Clearance: 30.8 mL/min (by C-G formula based on Cr of 2.32).  Assessment: 70 y.o. male with Afib awaiting DCCV for heparin.  Heparin level at goal this am, no changes needed, no bleeding issues noted.  For cardioversion this afternoon.  Goal of Therapy:  Heparin level 0.3-0.5 units/ml  Monitor platelets by anticoagulation protocol: Yes   Plan:  Continue Heparin at current rate   Sheppard Coil PharmD., BCPS Clinical Pharmacist Pager [phone] 05/25/2016 7:46 AM

## 2016-05-25 NOTE — Progress Notes (Signed)
Patient ID: Martin [name], male   DOB: [birth year], 70 y.o.   MRN: [id number]     Advanced Heart Failure Rounding Note  Referring Physician: Dr Diona Browner Primary Cardiologist: Dr. Gala Romney   Subjective:    Presented 05/18/16 after transfer from [facility] with cardiogenic shock. Placed on milrinone, norepi, and amio drip for afib.    Failed DC-CV 5/26 despite amiodarone. Ranexa added.   TEE 5/26 with EF 15-20% and severe RV dysfunction. LA 5.6 cm  Yesterday milrinone was cut back 0.125 mcg. Also diuretics held. Creatinine 2.9.>2.6 >2.3    Denies SOB/orthopnea.   Objective:   Weight Range: 184 lb 11.9 oz (83.8 kg) Body mass index is 30.74 kg/(m^2).   Vital Signs:   Temp:  [97.6 F (36.4 C)-97.9 F (36.6 C)] 97.6 F (36.4 C) (05/31 0356) Pulse Rate:  [31-101] 93 (05/31 0700) Resp:  [15-29] 22 (05/31 0700) BP: (84-119)/(59-91) 97/72 mmHg (05/31 0700) SpO2:  [93 %-100 %] 97 % (05/31 0700) Weight:  [184 lb 11.9 oz (83.8 kg)] 184 lb 11.9 oz (83.8 kg) (05/31 0356) Last BM Date: 05/24/16  Weight change: Filed Weights   05/23/16 0444 05/24/16 0600 05/25/16 0356  Weight: 187 lb 2.7 oz (84.9 kg) 186 lb 1.1 oz (84.4 kg) 184 lb 11.9 oz (83.8 kg)    Intake/Output:   Intake/Output Summary (Last 24 hours) at 05/25/16 0745 Last data filed at 05/25/16 0700  Gross per 24 hour  Intake 1095.47 ml  Output   2950 ml  Net -1854.53 ml     Physical Exam: CVP 4-5 General: In bed.  NAD HEENT: Normal except for poor dentition Neck: supple. JVP 6. Carotids 2+ bilat; no bruits. No thyromegaly or nodule noted.  Cor: Irregularly irregular rate, tachy. +S3.  Lungs: clear anteriorly  Abdomen: soft, NT, mild/mod distended no HSM. No bruits or masses. +BS  + mass in R adbomen Extremities: no cyanosis, clubbing, rash. Still 2+ ankle edema with unna boots on. Neuro: alert & orientedx3, cranial nerves grossly intact. moves all 4 extremities w/o difficulty. Affect pleasant  Telemetry: Afib  90s  Labs: CBC  Recent Labs  05/24/16 0315 05/25/16 0327  WBC 11.0* 9.7  NEUTROABS 8.5* 7.1  HGB 15.7 15.6  HCT 46.5 45.2  MCV 87.2 86.6  PLT 119* 128*   Basic Metabolic Panel  Recent Labs  05/24/16 0315 05/25/16 0327  NA 125* 127*  K 3.9 4.6  CL 70* 78*  CO2 43* 39*  GLUCOSE 133* 123*  BUN 54* 47*  CREATININE 2.69* 2.32*  CALCIUM 8.9 8.8*   Liver Function Tests  Recent Labs  05/23/16 0514 05/24/16 0315  AST 30 29  ALT 30 29  ALKPHOS 87 84  BILITOT 1.7* 1.3*  PROT 6.5 6.3*  ALBUMIN 2.9* 2.7*   No results for input(s): LIPASE, AMYLASE in the last 72 hours. Cardiac Enzymes No results for input(s): CKTOTAL, CKMB, CKMBINDEX, TROPONINI in the last 72 hours.  BNP: BNP (last 3 results)  Recent Labs  05/17/16 1250  BNP 905.0*    ProBNP (last 3 results) No results for input(s): PROBNP in the last 8760 hours.   D-Dimer No results for input(s): DDIMER in the last 72 hours. Hemoglobin A1C No results for input(s): HGBA1C in the last 72 hours. Fasting Lipid Panel No results for input(s): CHOL, HDL, LDLCALC, TRIG, CHOLHDL, LDLDIRECT in the last 72 hours. Thyroid Function Tests No results for input(s): TSH, T4TOTAL, T3FREE, THYROIDAB in the last 72 hours.  Invalid input(s): FREET3  Other results:  Imaging/Studies:  No results found.   Medications:     Scheduled Medications: . antiseptic oral rinse  7 mL Mouth Rinse BID  . aspirin EC  81 mg Oral Daily  . atorvastatin  80 mg Oral q1800  . insulin aspart  0-5 Units Subcutaneous QHS  . insulin aspart  0-9 Units Subcutaneous TID WC  . potassium chloride  40 mEq Oral BID  . ranolazine  500 mg Oral BID  . sodium chloride flush  10-40 mL Intracatheter Q12H  . sodium chloride flush  3 mL Intravenous Q12H    Infusions: . sodium chloride    . amiodarone 60 mg/hr (05/24/16 2145)  . heparin 900 Units/hr (05/24/16 2000)  . milrinone 0.125 mcg/kg/min (05/24/16 2144)    PRN Medications: sodium  chloride, acetaminophen, ondansetron (ZOFRAN) IV, sodium chloride flush, sodium chloride flush, sorbitol   Assessment   1. Cardiogenic Shock 2 Acute on chronic systolic HF in the setting of Afib RVR 3. Afib with hx of tachycardia mediated CMP This patients CHA2DS2-VASc Score and unadjusted Ischemic Stroke Rate (% per year) is equal to 4.8 % stroke rate/year from a score of 4 (CHF, HTN, age, vascular disease) 4. AKI on CKD stage 3 5. Hx of rectus sheath hematoma on coumadin 2013 - refuses anticoagulation 6. HTN 7. Hyperkalemia  8. Hypokalemia  Plan    Remains in AF with RVR despite amiodarone + Ranexa.  Failed DC-CV on 5/26.  Will continue amio/heparin. Cut back amio after DC-CV. Plan repeat DC-CV today. NPO. If fails, will need to consider AVN ablation and BIV pacer. He does not want to go back on coumadin. Consider eliquis 5 mg twice a day.   Co-ox 58% today.  Stop milrinone to 0.125 mcg. Hopefully will improve further if we can get him back to NSR. On heparin. No diuretics today. Stop potassium. No bb with cardiogenic shock. No ace with CKD. Renal function trending down.   Ideally will need cath with Trop > 30 but renal function prohibitive at this point (rising to 2.9).   Length of Stay: 8 [name] NP-C  05/25/2016, 7:45 AM  Advanced Heart Failure Team Pager [phone] (M-F; 7a - 4p)  Please contact [facility] for night-coverage after hours (4p -7a ) and weekends on [email or web address]  Patient seen and examined with [name], NP. We discussed all aspects of the encounter. I agree with the assessment and plan as stated above.    He is improving. Underwent successful DC-CV today with Dr. Elease Hashimoto. Will switch IV amio to po. Continue Ranexa. He is reluctant to take ACs due to previous bleed but will need several weeks of Eliquis at least after DC-CV. Volume status stable on po diuretics. Given NSTEMI ideally would warrant cath but renal function prohibitive. Will proceed with Myoview in am  .  Siniya Lichty,MD 11:34 PM

## 2016-05-25 NOTE — Anesthesia Postprocedure Evaluation (Signed)
Anesthesia Post Note  Patient: Martin [name]  Procedure(s) Performed: Procedure(s) (LRB): CARDIOVERSION (N/A)  Patient location during evaluation: Endoscopy Anesthesia Type: MAC Level of consciousness: sedated and responds to stimulation Pain management: pain level controlled Vital Signs Assessment: post-procedure vital signs reviewed and stable Respiratory status: spontaneous breathing, respiratory function stable, nonlabored ventilation and patient connected to nasal cannula oxygen Cardiovascular status: blood pressure returned to baseline and stable Anesthetic complications: no    Last Vitals:  Filed Vitals:   05/25/16 1125 05/25/16 1159  BP:  128/78  Pulse:  92  Temp: 36.4 C 36.4 C  Resp:      Last Pain:  Filed Vitals:   05/25/16 1210  PainSc: 0-No pain                 Loistine Eberlin, Cristy Friedlander

## 2016-05-25 NOTE — Transfer of Care (Signed)
Immediate Anesthesia Transfer of Care Note  Patient: Martin [name]  Procedure(s) Performed: Procedure(s): CARDIOVERSION (N/A)  Patient Location: Endoscopy Unit  Anesthesia Type:MAC  Level of Consciousness: sedated and responds to stimulation  Airway & Oxygen Therapy: Patient Spontanous Breathing and Patient connected to nasal cannula oxygen  Post-op Assessment: Report given to RN and Post -op Vital signs reviewed and stable  Post vital signs: Reviewed and stable  Last Vitals:  Filed Vitals:   05/25/16 1125 05/25/16 1159  BP:  128/78  Pulse:  92  Temp: 36.4 C 36.4 C  Resp:      Last Pain:  Filed Vitals:   05/25/16 1210  PainSc: 0-No pain         Complications: No apparent anesthesia complications

## 2016-05-25 NOTE — H&P (View-Only) (Signed)
Patient ID: Martin [name], male   DOB: 09-21-1946, 70 y.o.   MRN: [id number]     Advanced Heart Failure Rounding Note  Referring Physician: Dr Diona Browner Primary Cardiologist: Dr. Gala Romney   Subjective:    Presented 05/18/16 after transfer from [facility] with cardiogenic shock. Placed on milrinone, norepi, and amio drip for afib.    Failed DC-CV 5/26 despite amiodarone. Ranexa added.   TEE 5/26 with EF 15-20% and severe RV dysfunction. LA 5.6 cm  Yesterday milrinone was cut back 0.125 mcg. Also diuretics held. Creatinine 2.9.>2.6    Denies SOB/CP   Objective:   Weight Range: 186 lb 1.1 oz (84.4 kg) Body mass index is 30.96 kg/(m^2).   Vital Signs:   Temp:  [97.4 F (36.3 C)-98.2 F (36.8 C)] 97.7 F (36.5 C) (05/30 0400) Pulse Rate:  [36-117] 101 (05/30 0700) Resp:  [17-32] 26 (05/30 0700) BP: (81-121)/(63-90) 96/78 mmHg (05/30 0700) SpO2:  [93 %-100 %] 95 % (05/30 0700) Weight:  [186 lb 1.1 oz (84.4 kg)] 186 lb 1.1 oz (84.4 kg) (05/30 0600) Last BM Date: 05/21/16  Weight change: Filed Weights   05/22/16 0410 05/23/16 0444 05/24/16 0600  Weight: 191 lb 12.8 oz (87 kg) 187 lb 2.7 oz (84.9 kg) 186 lb 1.1 oz (84.4 kg)    Intake/Output:   Intake/Output Summary (Last 24 hours) at 05/24/16 0754 Last data filed at 05/24/16 0700  Gross per 24 hour  Intake 2128.62 ml  Output   3300 ml  Net -1171.38 ml     Physical Exam: CVP 2 General: In bed.  NAD HEENT: Normal except for poor dentition Neck: supple. JVP 6. Carotids 2+ bilat; no bruits. No thyromegaly or nodule noted.  Cor: Irregularly irregular rate, tachy. +S3.  Lungs: clear anteriorly  Abdomen: soft, NT, mild/mod distended no HSM. No bruits or masses. +BS  + mass in R adbomen Extremities: no cyanosis, clubbing, rash. Still 2+ ankle edema with unna boots on. Neuro: alert & orientedx3, cranial nerves grossly intact. moves all 4 extremities w/o difficulty. Affect pleasant  Telemetry: Afib  100s  Labs: CBC  Recent Labs  05/23/16 0514 05/24/16 0315  WBC 10.3 11.0*  NEUTROABS 8.1* 8.5*  HGB 16.1 15.7  HCT 47.0 46.5  MCV 87.0 87.2  PLT 116* 119*   Basic Metabolic Panel  Recent Labs  05/23/16 0514 05/24/16 0315  NA 126* 125*  K 3.8 3.9  CL 68* 70*  CO2 44* 43*  GLUCOSE 139* 133*  BUN 51* 54*  CREATININE 2.90* 2.69*  CALCIUM 9.2 8.9   Liver Function Tests  Recent Labs  05/23/16 0514 05/24/16 0315  AST 30 29  ALT 30 29  ALKPHOS 87 84  BILITOT 1.7* 1.3*  PROT 6.5 6.3*  ALBUMIN 2.9* 2.7*   No results for input(s): LIPASE, AMYLASE in the last 72 hours. Cardiac Enzymes No results for input(s): CKTOTAL, CKMB, CKMBINDEX, TROPONINI in the last 72 hours.  BNP: BNP (last 3 results)  Recent Labs  05/17/16 1250  BNP 905.0*    ProBNP (last 3 results) No results for input(s): PROBNP in the last 8760 hours.   D-Dimer No results for input(s): DDIMER in the last 72 hours. Hemoglobin A1C No results for input(s): HGBA1C in the last 72 hours. Fasting Lipid Panel No results for input(s): CHOL, HDL, LDLCALC, TRIG, CHOLHDL, LDLDIRECT in the last 72 hours. Thyroid Function Tests No results for input(s): TSH, T4TOTAL, T3FREE, THYROIDAB in the last 72 hours.  Invalid input(s): FREET3  Other results:  Imaging/Studies:  No results found.   Medications:     Scheduled Medications: . antiseptic oral rinse  7 mL Mouth Rinse BID  . aspirin EC  81 mg Oral Daily  . atorvastatin  80 mg Oral q1800  . furosemide  60 mg Oral BID  . insulin aspart  0-5 Units Subcutaneous QHS  . insulin aspart  0-9 Units Subcutaneous TID WC  . potassium chloride  40 mEq Oral BID  . ranolazine  500 mg Oral BID  . sodium chloride flush  10-40 mL Intracatheter Q12H  . sodium chloride flush  3 mL Intravenous Q12H    Infusions: . sodium chloride    . amiodarone 60 mg/hr (05/23/16 2116)  . heparin 900 Units/hr (05/23/16 2000)  . milrinone 0.25 mcg/kg/min (05/23/16 2116)     PRN Medications: sodium chloride, acetaminophen, ondansetron (ZOFRAN) IV, sodium chloride flush, sodium chloride flush, sorbitol   Assessment   1. Cardiogenic Shock 2 Acute on chronic systolic HF in the setting of Afib RVR 3. Afib with hx of tachycardia mediated CMP This patients CHA2DS2-VASc Score and unadjusted Ischemic Stroke Rate (% per year) is equal to 4.8 % stroke rate/year from a score of 4 (CHF, HTN, age, vascular disease) 4. AKI on CKD stage 3 5. Hx of rectus sheath hematoma on coumadin 2013 - refuses anticoagulation 6. HTN 7. Hyperkalemia  8. Hypokalemia  Plan    Remains in AF with RVR despite amiodarone + Ranexa.  Failed DC-CV on 5/26.  Will continue amio/heparin. Plan repeat DC-CV today. NPO. If fails, will need to consider AVN ablation and BIV pacer.  Co-ox 64% today.  Decrease milrinone to 0.125 mcg. Hopefully will improve further if we can get him back to NSR. On heparin.   Ideally will need cath with Trop > 30 but renal function prohibitive at this point (rising to 2.9).   Length of Stay: 7 [name] NP-C  05/24/2016, 7:54 AM  Advanced Heart Failure Team Pager [phone] (M-F; 7a - 4p)  Please contact [facility] for night-coverage after hours (4p -7a ) and weekends on [email or web address]  Patient seen and examined with [name], NP. We discussed all aspects of the encounter. I agree with the assessment and plan as stated above.   Volume status much better. Can stop lasix. Co-ox good. Will wean milrinone. Renal function stable but too high for cath. Still in AF. Will plan DC-CV today. Continue heparin, amio and Ranexa. Hopefully can go to SDU soon.  ,sig

## 2016-05-25 NOTE — Interval H&P Note (Signed)
History and Physical Interval Note:  05/25/2016 12:18 PM  Martin [name]  has presented today for surgery, with the diagnosis of AFIB  The various methods of treatment have been discussed with the patient and family. After consideration of risks, benefits and other options for treatment, the patient has consented to  Procedure(s): CARDIOVERSION (N/A) as a surgical intervention .  The patient's history has been reviewed, patient examined, no change in status, stable for surgery.  I have reviewed the patient's chart and labs.  Questions were answered to the patient's satisfaction.     Kristeen Miss

## 2016-05-26 ENCOUNTER — Inpatient Hospital Stay (HOSPITAL_COMMUNITY): Payer: Medicare Other

## 2016-05-26 DIAGNOSIS — I509 Heart failure, unspecified: Secondary | ICD-10-CM

## 2016-05-26 LAB — NM MYOCAR MULTI W/SPECT W/WALL MOTION / EF
CHL CUP RESTING HR STRESS: 65 {beats}/min
CHL CUP STRESS STAGE 1 GRADE: 0 %
CHL CUP STRESS STAGE 1 SPEED: 0 mph
CHL CUP STRESS STAGE 2 GRADE: 0 %
CHL CUP STRESS STAGE 3 HR: 69 {beats}/min
CHL CUP STRESS STAGE 4 DBP: 67 mmHg
CHL CUP STRESS STAGE 4 SPEED: 0 mph
LV dias vol: 164 mL (ref 62–150)
LVSYSVOL: 105 mL
NUC STRESS TID: 1.29
RATE: 0.38
SDS: 7
SRS: 2
SSS: 9
Stage 1 HR: 65 {beats}/min
Stage 2 HR: 65 {beats}/min
Stage 2 Speed: 0 mph
Stage 3 DBP: 67 mmHg
Stage 3 Grade: 0 %
Stage 3 SBP: 99 mmHg
Stage 3 Speed: 0 mph
Stage 4 Grade: 0 %
Stage 4 HR: 68 {beats}/min
Stage 4 SBP: 102 mmHg

## 2016-05-26 LAB — GLUCOSE, CAPILLARY
GLUCOSE-CAPILLARY: 114 mg/dL — AB (ref 65–99)
Glucose-Capillary: 104 mg/dL — ABNORMAL HIGH (ref 65–99)
Glucose-Capillary: 90 mg/dL (ref 65–99)
Glucose-Capillary: 104 mg/dL — ABNORMAL HIGH (ref 65–99)

## 2016-05-26 LAB — CARBOXYHEMOGLOBIN
Carboxyhemoglobin: 1.4 % (ref 0.5–1.5)
Methemoglobin: 0.7 % (ref 0.0–1.5)
O2 Saturation: 59.5 %
TOTAL HEMOGLOBIN: 16.3 g/dL (ref 13.5–18.0)

## 2016-05-26 LAB — CBC
HEMATOCRIT: 47.6 % (ref 39.0–52.0)
Hemoglobin: 15.8 g/dL (ref 13.0–17.0)
MCH: 28.9 pg (ref 26.0–34.0)
MCHC: 33.2 g/dL (ref 30.0–36.0)
MCV: 87 fL (ref 78.0–100.0)
Platelets: 151 10*3/uL (ref 150–400)
RBC: 5.47 MIL/uL (ref 4.22–5.81)
RDW: 14.1 % (ref 11.5–15.5)
WBC: 6.9 10*3/uL (ref 4.0–10.5)

## 2016-05-26 LAB — BASIC METABOLIC PANEL
ANION GAP: 10 (ref 5–15)
BUN: 39 mg/dL — ABNORMAL HIGH (ref 6–20)
CALCIUM: 8.8 mg/dL — AB (ref 8.9–10.3)
CO2: 33 mmol/L — ABNORMAL HIGH (ref 22–32)
Chloride: 85 mmol/L — ABNORMAL LOW (ref 101–111)
Creatinine, Ser: 2.06 mg/dL — ABNORMAL HIGH (ref 0.61–1.24)
GFR calc non Af Amer: 32 mL/min — ABNORMAL LOW (ref >60)
GFR, EST AFRICAN AMERICAN: 37 mL/min — AB (ref >60)
Glucose, Bld: 102 mg/dL — ABNORMAL HIGH (ref 65–99)
POTASSIUM: 3.7 mmol/L (ref 3.5–5.1)
SODIUM: 128 mmol/L — AB (ref 135–145)

## 2016-05-26 LAB — HEPARIN LEVEL (UNFRACTIONATED): HEPARIN UNFRACTIONATED: 0.7 [IU]/mL (ref 0.30–0.70)

## 2016-05-26 MED ORDER — REGADENOSON 0.4 MG/5ML IV SOLN
INTRAVENOUS | Status: AC
  Administered 2016-05-26: 0.4 mg via INTRAVENOUS
  Filled 2016-05-26: qty 5

## 2016-05-26 MED ORDER — AMIODARONE HCL 200 MG PO TABS
200.0000 mg | ORAL_TABLET | Freq: Two times a day (BID) | ORAL | Status: DC
  Administered 2016-05-26 – 2016-05-27 (×3): 200 mg via ORAL
  Filled 2016-05-26 (×3): qty 1

## 2016-05-26 MED ORDER — REGADENOSON 0.4 MG/5ML IV SOLN
0.4000 mg | Freq: Once | INTRAVENOUS | Status: AC
Start: 2016-05-26 — End: 2016-05-26
  Administered 2016-05-26: 0.4 mg via INTRAVENOUS
  Filled 2016-05-26: qty 5

## 2016-05-26 MED ORDER — TECHNETIUM TC 99M TETROFOSMIN IV KIT
30.0000 | PACK | Freq: Once | INTRAVENOUS | Status: AC | PRN
  Administered 2016-05-26: 30 via INTRAVENOUS

## 2016-05-26 MED ORDER — FUROSEMIDE 80 MG PO TABS
80.0000 mg | ORAL_TABLET | Freq: Every day | ORAL | Status: DC
  Administered 2016-05-26: 80 mg via ORAL
  Filled 2016-05-26: qty 1

## 2016-05-26 MED ORDER — POTASSIUM CHLORIDE CRYS ER 20 MEQ PO TBCR
40.0000 meq | EXTENDED_RELEASE_TABLET | Freq: Once | ORAL | Status: AC
  Administered 2016-05-26: 40 meq via ORAL
  Filled 2016-05-26: qty 2

## 2016-05-26 MED ORDER — APIXABAN 5 MG PO TABS
5.0000 mg | ORAL_TABLET | Freq: Two times a day (BID) | ORAL | Status: DC
  Administered 2016-05-26 – 2016-05-27 (×3): 5 mg via ORAL
  Filled 2016-05-26 (×3): qty 1

## 2016-05-26 MED ORDER — TECHNETIUM TC 99M TETROFOSMIN IV KIT
10.0000 | PACK | Freq: Once | INTRAVENOUS | Status: AC | PRN
  Administered 2016-05-26: 10 via INTRAVENOUS

## 2016-05-26 NOTE — Progress Notes (Signed)
Lexiscan nuc performed, day 1/1, pt tolerated without complication. Await images. Dayna Dunn PA-C

## 2016-05-26 NOTE — Progress Notes (Signed)
Patient ID: Martin [name], male   DOB: 1946-10-09, 70 y.o.   MRN: [id number]     Advanced Heart Failure Rounding Note  Referring Physician: Dr Diona Browner Primary Cardiologist: Dr. Gala Romney   Subjective:    Presented 05/18/16 after transfer from [facility] with cardiogenic shock. Placed on milrinone, norepi, and amio drip for afib.    Failed DC-CV 5/26 despite amiodarone. Ranexa added.   TEE 5/26 with EF 15-20% and severe RV dysfunction. LA 5.6 cm  Yesterday milrinone was stopped. Had successful DC-CV. Also diuretics held. Creatinine 2.9.>2.6 >2.3>pending.    Denies SOB/orthopnea.   Objective:   Weight Range: 183 lb 10.3 oz (83.3 kg) Body mass index is 30.56 kg/(m^2).   Vital Signs:   Temp:  [97.4 F (36.3 C)-98.3 F (36.8 C)] 98 F (36.7 C) (06/01 0736) Pulse Rate:  [58-95] 61 (06/01 0700) Resp:  [19-25] 24 (06/01 0700) BP: (97-128)/(71-81) 103/73 mmHg (06/01 0700) SpO2:  [86 %-100 %] 97 % (06/01 0700) Weight:  [183 lb 10.3 oz (83.3 kg)] 183 lb 10.3 oz (83.3 kg) (06/01 0500) Last BM Date: 05/25/16  Weight change: Filed Weights   05/24/16 0600 05/25/16 0356 05/26/16 0500  Weight: 186 lb 1.1 oz (84.4 kg) 184 lb 11.9 oz (83.8 kg) 183 lb 10.3 oz (83.3 kg)    Intake/Output:   Intake/Output Summary (Last 24 hours) at 05/26/16 0748 Last data filed at 05/26/16 0700  Gross per 24 hour  Intake 1357.35 ml  Output   2575 ml  Net -1217.65 ml     Physical Exam: CVP 4-5 General: In bed.  NAD HEENT: Normal except for poor dentition Neck: supple. JVP 5-6. Carotids 2+ bilat; no bruits. No thyromegaly or nodule noted.  Cor: Irregularly irregular rate, tachy. +S3.  Lungs: clear anteriorly  Abdomen: soft, NT, mild/mod distended no HSM. No bruits or masses. +BS  + mass in R adbomen Extremities: no cyanosis, clubbing, rash. R and LLE unna boots on. Neuro: alert & orientedx3, cranial nerves grossly intact. moves all 4 extremities w/o difficulty. Affect pleasant  Telemetry: NSR  60s  Labs: CBC  Recent Labs  05/24/16 0315 05/25/16 0327  WBC 11.0* 9.7  NEUTROABS 8.5* 7.1  HGB 15.7 15.6  HCT 46.5 45.2  MCV 87.2 86.6  PLT 119* 128*   Basic Metabolic Panel  Recent Labs  05/24/16 0315 05/25/16 0327  NA 125* 127*  K 3.9 4.6  CL 70* 78*  CO2 43* 39*  GLUCOSE 133* 123*  BUN 54* 47*  CREATININE 2.69* 2.32*  CALCIUM 8.9 8.8*   Liver Function Tests  Recent Labs  05/24/16 0315  AST 29  ALT 29  ALKPHOS 84  BILITOT 1.3*  PROT 6.3*  ALBUMIN 2.7*   No results for input(s): LIPASE, AMYLASE in the last 72 hours. Cardiac Enzymes No results for input(s): CKTOTAL, CKMB, CKMBINDEX, TROPONINI in the last 72 hours.  BNP: BNP (last 3 results)  Recent Labs  05/17/16 1250  BNP 905.0*    ProBNP (last 3 results) No results for input(s): PROBNP in the last 8760 hours.   D-Dimer No results for input(s): DDIMER in the last 72 hours. Hemoglobin A1C No results for input(s): HGBA1C in the last 72 hours. Fasting Lipid Panel No results for input(s): CHOL, HDL, LDLCALC, TRIG, CHOLHDL, LDLDIRECT in the last 72 hours. Thyroid Function Tests No results for input(s): TSH, T4TOTAL, T3FREE, THYROIDAB in the last 72 hours.  Invalid input(s): FREET3  Other results:  Imaging/Studies:  No results found.   Medications:  Scheduled Medications: . antiseptic oral rinse  7 mL Mouth Rinse BID  . aspirin EC  81 mg Oral Daily  . atorvastatin  80 mg Oral q1800  . insulin aspart  0-5 Units Subcutaneous QHS  . insulin aspart  0-9 Units Subcutaneous TID WC  . ranolazine  500 mg Oral BID  . sodium chloride flush  10-40 mL Intracatheter Q12H  . sodium chloride flush  3 mL Intravenous Q12H    Infusions: . sodium chloride    . amiodarone 30 mg/hr (05/25/16 2207)  . heparin 900 Units/hr (05/25/16 2207)    PRN Medications: sodium chloride, acetaminophen, ondansetron (ZOFRAN) IV, sodium chloride flush, sodium chloride flush, sorbitol   Assessment   1.  Cardiogenic Shock 2 Acute on chronic systolic HF in the setting of Afib RVR 3. Afib with hx of tachycardia mediated CMP This patients CHA2DS2-VASc Score and unadjusted Ischemic Stroke Rate (% per year) is equal to 4.8 % stroke rate/year from a score of 4 (CHF, HTN, age, vascular disease) 4. AKI on CKD stage 3 5. Hx of rectus sheath hematoma on coumadin 2013 - refuses anticoagulation 6. HTN 7. Hyperkalemia  8. Hypokalemia 9. Hyponatremia  Plan    Maintaining NSR after DC-CV. Stop IV amio and start amio 200 mg twice a day. Will need eliquis 5 mg twice a day. Watch closely. Has had rectus hematoma 2013.  He is agreeable to anticoagulation short term.   Co-ox 60% today.  No diuretics today. Stop potassium. No bb with cardiogenic shock. No ace with CKD. Renal function trending down.   Ideally will need cath with Trop > 30 but renal function prohibitive at this point (2.3). Plan for Myoview today. On statin.   Transfer to SDU.   Length of Stay: 9 [name] NP-C  05/26/2016, 7:48 AM  Advanced Heart Failure Team Pager [phone] (M-F; 7a - 4p)  Please contact [facility] for night-coverage after hours (4p -7a ) and weekends on [email or web address]  Patient seen and examined with Martin Becket, NP. We discussed all aspects of the encounter. I agree with the assessment and plan as stated above.   He is stable this am. Remains in NSR today after DC-CV yesterday. Will plan Myoview today. He is reluctant about anti-coagulation due to previous bleed but agrees to several weeks of Eliquis.   Weight stable off diuretics. Renal function slightly improved. Need to restart oral diuretics. Start with lasix 80 daily.  Will transition to po amio. Continue Ranexa. Will need CR to see. Will try to get home in next 24-48 hours if stable.  Martin Tempesta,MD 8:34 AM

## 2016-05-26 NOTE — Progress Notes (Signed)
CARDIAC REHAB PHASE I   PRE:  Rate/Rhythm: 64 SR  BP:  Sitting: 107/67        SaO2: 100 RA  MODE:  Ambulation: 350 ft   POST:  Rate/Rhythm: 72 SR  BP:  Sitting: 131/83         SaO2: 99 RA  Pt ambulated 350 ft on RA, IV, foley catheter, assist x2, fairly steady gait, tolerated well, no complaints other than weakness in his legs. Very pleasant, appreciative of walk. Pt to recliner after walk, call bell within reach. Will follow up tomorrow. Pt can be assist x1.   [phone] Joylene Grapes, RN, BSN 05/26/2016 2:19 PM

## 2016-05-26 NOTE — Progress Notes (Signed)
Per Dr. Mayford Knife, nuclear stress test abnormal. D/w Dr. Gala Romney - no present plans for cath. Will defer to CHF team to review further with patient in AM. Ronie Spies PA-C

## 2016-05-26 NOTE — Progress Notes (Signed)
Gave pt 30day free eliquis card. 

## 2016-05-26 NOTE — Care Management Important Message (Signed)
Important Message  Patient Details  Name: Sahan Kidder MRN: [id number] Date of Birth: 02-21-1946   Medicare Important Message Given:  Yes    Bernadette Hoit 05/26/2016, 10:22 AM

## 2016-05-27 LAB — BASIC METABOLIC PANEL
ANION GAP: 9 (ref 5–15)
BUN: 46 mg/dL — ABNORMAL HIGH (ref 6–20)
CHLORIDE: 87 mmol/L — AB (ref 101–111)
CO2: 34 mmol/L — AB (ref 22–32)
Calcium: 9 mg/dL (ref 8.9–10.3)
Creatinine, Ser: 2.15 mg/dL — ABNORMAL HIGH (ref 0.61–1.24)
GFR calc non Af Amer: 30 mL/min — ABNORMAL LOW (ref >60)
GFR, EST AFRICAN AMERICAN: 35 mL/min — AB (ref >60)
Glucose, Bld: 101 mg/dL — ABNORMAL HIGH (ref 65–99)
POTASSIUM: 4.5 mmol/L (ref 3.5–5.1)
Sodium: 130 mmol/L — ABNORMAL LOW (ref 135–145)

## 2016-05-27 LAB — CBC
HCT: 47.2 % (ref 39.0–52.0)
HEMOGLOBIN: 15.8 g/dL (ref 13.0–17.0)
MCH: 29.6 pg (ref 26.0–34.0)
MCHC: 33.5 g/dL (ref 30.0–36.0)
MCV: 88.6 fL (ref 78.0–100.0)
Platelets: 160 10*3/uL (ref 150–400)
RBC: 5.33 MIL/uL (ref 4.22–5.81)
RDW: 14.2 % (ref 11.5–15.5)
WBC: 8 10*3/uL (ref 4.0–10.5)

## 2016-05-27 LAB — CARBOXYHEMOGLOBIN
Carboxyhemoglobin: 1.2 % (ref 0.5–1.5)
Methemoglobin: 0.8 % (ref 0.0–1.5)
O2 Saturation: 55.9 %
Total hemoglobin: 16.5 g/dL (ref 13.5–18.0)

## 2016-05-27 LAB — GLUCOSE, CAPILLARY
GLUCOSE-CAPILLARY: 114 mg/dL — AB (ref 65–99)
GLUCOSE-CAPILLARY: 135 mg/dL — AB (ref 65–99)

## 2016-05-27 LAB — MAGNESIUM: MAGNESIUM: 2 mg/dL (ref 1.7–2.4)

## 2016-05-27 MED ORDER — AMIODARONE HCL 200 MG PO TABS: 200.0000 mg | ORAL_TABLET | Freq: Two times a day (BID) | ORAL | Status: DC

## 2016-05-27 MED ORDER — RANOLAZINE ER 500 MG PO TB12: 500.0000 mg | ORAL_TABLET | Freq: Two times a day (BID) | ORAL | Status: DC

## 2016-05-27 MED ORDER — ATORVASTATIN CALCIUM 80 MG PO TABS: 80.0000 mg | ORAL_TABLET | Freq: Every day | ORAL | Status: AC

## 2016-05-27 MED ORDER — FUROSEMIDE 40 MG PO TABS: 40.0000 mg | ORAL_TABLET | Freq: Every day | ORAL | Status: DC

## 2016-05-27 MED ORDER — APIXABAN 5 MG PO TABS: 5.0000 mg | ORAL_TABLET | Freq: Two times a day (BID) | ORAL | Status: DC

## 2016-05-27 NOTE — Progress Notes (Signed)
Patient ID: Martin [name], male   DOB: 1946-01-18, 70 y.o.   MRN: [id number]     Advanced Heart Failure Rounding Note  Referring Physician: Dr Diona Browner Primary Cardiologist: Dr. Gala Romney   Subjective:   Yesterday po lasix started.   Myoview   medium defect of severe severity present in the apical septal, apical inferior and apex location. The defect is reversible and consistent with ischemia  Denies SOB/orthopnea.   Objective:   Weight Range: 182 lb 5.1 oz (82.7 kg) Body mass index is 30.34 kg/(m^2).   Vital Signs:   Temp:  [97.6 F (36.4 C)-98.2 F (36.8 C)] 98.2 F (36.8 C) (06/02 0749) Pulse Rate:  [35-115] 59 (06/02 0749) Resp:  [14-25] 20 (06/02 0749) BP: (88-124)/(62-81) 109/77 mmHg (06/02 0749) SpO2:  [96 %-100 %] 98 % (06/02 0749) Weight:  [182 lb 5.1 oz (82.7 kg)] 182 lb 5.1 oz (82.7 kg) (06/02 0300) Last BM Date: 05/26/16  Weight change: Filed Weights   05/25/16 0356 05/26/16 0500 05/27/16 0300  Weight: 184 lb 11.9 oz (83.8 kg) 183 lb 10.3 oz (83.3 kg) 182 lb 5.1 oz (82.7 kg)    Intake/Output:   Intake/Output Summary (Last 24 hours) at 05/27/16 0855 Last data filed at 05/27/16 0700  Gross per 24 hour  Intake    936 ml  Output   2425 ml  Net  -1489 ml     Physical Exam: CVP 1 General: In bed.  NAD HEENT: Normal except for poor dentition Neck: supple. JVP flat. Carotids 2+ bilat; no bruits. No thyromegaly or nodule noted.  Cor: Irregularly irregular rate, tachy. +S3.  Lungs: clear anteriorly  Abdomen: soft, NT, mild/mod distended no HSM. No bruits or masses. +BS  + mass in R adbomen Extremities: no cyanosis, clubbing, rash. R and LLE unna boots on. Neuro: alert & orientedx3, cranial nerves grossly intact. moves all 4 extremities w/o difficulty. Affect pleasant  Telemetry: NSR 60s  Labs: CBC  Recent Labs  05/25/16 0327 05/26/16 1215 05/27/16 0330  WBC 9.7 6.9 8.0  NEUTROABS 7.1  --   --   HGB 15.6 15.8 15.8  HCT 45.2 47.6 47.2  MCV  86.6 87.0 88.6  PLT 128* 151 160   Basic Metabolic Panel  Recent Labs  05/26/16 1215 05/27/16 0330  NA 128* 130*  K 3.7 4.5  CL 85* 87*  CO2 33* 34*  GLUCOSE 102* 101*  BUN 39* 46*  CREATININE 2.06* 2.15*  CALCIUM 8.8* 9.0  MG  --  2.0   Liver Function Tests No results for input(s): AST, ALT, ALKPHOS, BILITOT, PROT, ALBUMIN in the last 72 hours. No results for input(s): LIPASE, AMYLASE in the last 72 hours. Cardiac Enzymes No results for input(s): CKTOTAL, CKMB, CKMBINDEX, TROPONINI in the last 72 hours.  BNP: BNP (last 3 results)  Recent Labs  05/17/16 1250  BNP 905.0*    ProBNP (last 3 results) No results for input(s): PROBNP in the last 8760 hours.   D-Dimer No results for input(s): DDIMER in the last 72 hours. Hemoglobin A1C No results for input(s): HGBA1C in the last 72 hours. Fasting Lipid Panel No results for input(s): CHOL, HDL, LDLCALC, TRIG, CHOLHDL, LDLDIRECT in the last 72 hours. Thyroid Function Tests No results for input(s): TSH, T4TOTAL, T3FREE, THYROIDAB in the last 72 hours.  Invalid input(s): FREET3  Other results:  Imaging/Studies:  Nm Myocar Multi W/spect W/wall Motion / Ef  05/26/2016   There was no ST segment deviation noted during stress.  There  is a medium defect of moderate severity present in the basal inferior, mid inferior and apical inferior location. The defect is non-reversible. This is consistent with either diaphragmatic attenuation artifact or infarct. No ischemia noted.  There is a medium defect of severe severity present in the apical septal, apical inferior and apex location. The defect is reversible and consistent with ischemia.  Findings consistent with ischemia.  The TID is 1.29 consistent with transient ischemic dilatation which could indicate underlying multivessel CAD.  This is a high risk study.  Nuclear stress EF: 36% but visually appears 20-25%.      Medications:     Scheduled Medications: . amiodarone   200 mg Oral BID  . antiseptic oral rinse  7 mL Mouth Rinse BID  . apixaban  5 mg Oral BID  . atorvastatin  80 mg Oral q1800  . furosemide  80 mg Oral Daily  . insulin aspart  0-5 Units Subcutaneous QHS  . insulin aspart  0-9 Units Subcutaneous TID WC  . ranolazine  500 mg Oral BID  . sodium chloride flush  10-40 mL Intracatheter Q12H  . sodium chloride flush  3 mL Intravenous Q12H    Infusions: . sodium chloride      PRN Medications: sodium chloride, acetaminophen, ondansetron (ZOFRAN) IV, sodium chloride flush, sodium chloride flush, sorbitol   Assessment   1. Cardiogenic Shock 2 Acute on chronic systolic HF in the setting of Afib RVR 3. Afib with hx of tachycardia mediated CMP This patients CHA2DS2-VASc Score and unadjusted Ischemic Stroke Rate (% per year) is equal to 4.8 % stroke rate/year from a score of 4 (CHF, HTN, age, vascular disease) 4. AKI on CKD stage 3 5. Hx of rectus sheath hematoma on coumadin 2013 - refuses anticoagulation 6. HTN 7. Hyperkalemia  8. Hypokalemia 9. Hyponatremia  Plan    Maintaining NSR after DC-CV. Continue amio 200 mg twice a day + eliquis 5 mg twice a day.   He is agreeable to anticoagulation short term. Has eliquis card. Hemoglobin stable.   Co-ox 56% today.  CVP 1. Hold lasix today then he will start 40 mg lasix po tomorrow. No bb with cardiogenic shock. No ace with CKD. Creatinine 2.0>2.1. Renal function trending down.   Ideally will need cath with Trop > 30 but renal function prohibitive at this point (2.3).  Myoview with medium defect of severe severity present in the apical septal, apical inferior and apex location. The defect is reversible and consistent with ischemia. On statin + eliquis. Plan to sent him home and bring back to the HF clinic next week and check BMET. Hopefully creatinine will come down so that he can get LHC.    Home today with follow up in HF clinic.   Length of Stay: 10 [name] NP-C  05/27/2016, 8:55  AM  Advanced Heart Failure Team Pager [phone] (M-F; 7a - 4p)  Please contact [facility] for night-coverage after hours (4p -7a ) and weekends on [email or web address]  Patient seen and examined with [name], NP. We discussed all aspects of the encounter. I agree with the assessment and plan as stated above.   Stable today. Maintaining NSR. Continue amio 200 bid/ranexa 500 bid and eliquis 5 bid. Agree with lasix 40 daily. No ace or b-blocker due to low output and ARF.   I reviewed Myoview images and there is only mild reversible defect but possible TID. Cannot cath now with rena failure and recent DC-CV with need for uninterrupted anti-coag. Can  consider outpatient cath if renal function improves. Continue statis. No asa with Eliquis.   Linnaea Ahn,MD 1:46 PM

## 2016-05-27 NOTE — Progress Notes (Signed)
Pt d/c home per MD order, pt VSS, pt d/c instructions given, family at [facility], pt and family verbalized understanding of d/c, all questions answered

## 2016-05-27 NOTE — Progress Notes (Signed)
Pt for dc home today. Has 30day free eliquis card. Will follow up at [facility] clinic. Has medicaid so copay should be 3.00 for eliquis.

## 2016-05-27 NOTE — Discharge Summary (Signed)
Advanced Heart Failure Team  Discharge Summary   Patient ID: Martin [name] MRN: [id number], DOB/AGE: 02/07/1946 70 y.o. Admit date: 05/17/2016 D/C date:     05/27/2016   Primary Discharge Diagnoses:  1. Cardiogenic Shock 2 Acute on chronic systolic HF in the setting of Afib RVR 3. Afib with hx of tachycardia mediated CMP This patients CHA2DS2-VASc Score and unadjusted Ischemic Stroke Rate (% per year) is equal to 4.8 % stroke rate/year from a score of 4 (CHF, HTN, age, vascular disease) 4. AKI on CKD stage 3 5. Hx of rectus sheath hematoma on coumadin 2013 - refuses anticoagulation 6. HTN 7. Hyperkalemia  8. Hypokalemia 9. Hyponatremia  Hospital Course:  Martin [name] is a 70 y.o. male with history of prior cardiomyopathy, afib, HTN, Hx of Rectus sheath hematoma while taking coumadin, CKD stage III, and DM type II.  Patient was admitted in 4/13 with cardiogenic shock in the setting of atrial fibrillation, EF 30-35% by echo at that time. With medical treatment, he improved, and later in 4/13, EF was up to normal range. He was started on coumadin for atrial fibrillation but developed a rectus sheath hematoma (large) and anticoagulation was stopped.  Admitted to [facility] with increased dyspnea progressively over the last few weeks. On admit creatine 1.7, K 4.4, BNP 905. Troponin 0.47 -> 25.87 -> 37.38.  Due to worsening renal function and poor response to IV diuresis he transferred to [facility] for Advanced Heart Failure Team to manage.   On arrival he was SOB at rest, 3+ lower extremity edema, hypotension, A fib RVR, and cool extremities. Had central access placed to assess mixed venous saturation and guide diuresis. Mixed venous saturation was low so he was placed milrinone, norepi, and amio drip. Diuresed with IV lasix and once euvolemic he transitioned to lasix 40 mg daily. Overall he diuresed 29 pounds. As he improved norepi and milrinone weaned off.   On May 26th he had TEE/DC-CV for persistent  A fib on IV amio.  Unfortunately cardioversion was unsuccessful. He remained on heparin and amio drip +ranexa. Once he was euvolemic he had successful cardioversion. Heparin was stopped and he was started on eliquis. He is agreeable to to short term anticoagulation. Amio drip was stopped and transitioned to amio 200 mg twice a day . Continue ranexa twice a day. .  Initially renal function worsened but with IV milrinone this improved over time. Creatinine peaked at 3.2 and trended down to 2.15. He will not be on Ace/spiro for this reason.   On admit troponin peaked 37 but due to elevated creatinine he was unable to have LHC so Lexiscan was performed with concern for ischemia-->  apical septal, apical inferior and apex location. Plan to follow up in the outpatient HF clinic and check renal function with plans for eventual LHC. He is going home statin and eliquis. No BB with cardiogenic shock.   He will continue to be followed closely in the HF clinic. Plan to check bmet and CBC at that time.     Discharge Weight: 182 pounds   Discharge Vitals: Blood pressure 111/81, pulse 56, temperature 98.2 F (36.8 C), temperature source Oral, resp. rate 15, height 5\' 5"  (1.651 m), weight 182 lb 5.1 oz (82.7 kg), SpO2 100 %.  Labs: Lab Results  Component Value Date   WBC 8.0 05/27/2016   HGB 15.8 05/27/2016   HCT 47.2 05/27/2016   MCV 88.6 05/27/2016   PLT 160 05/27/2016    Recent Labs Lab 05/24/16  0315  05/27/16 0330  NA 125*  < > 130*  K 3.9  < > 4.5  CL 70*  < > 87*  CO2 43*  < > 34*  BUN 54*  < > 46*  CREATININE 2.69*  < > 2.15*  CALCIUM 8.9  < > 9.0  PROT 6.3*  --   --   BILITOT 1.3*  --   --   ALKPHOS 84  --   --   ALT 29  --   --   AST 29  --   --   GLUCOSE 133*  < > 101*  < > = values in this interval not displayed. No results found for: CHOL, HDL, LDLCALC, TRIG BNP (last 3 results)  Recent Labs  05/17/16 1250  BNP 905.0*    ProBNP (last 3 results) No results for input(s):  PROBNP in the last 8760 hours.   Diagnostic Studies/Procedures   Nm Myocar Multi W/spect W/wall Motion / Ef  05/26/2016   There was no ST segment deviation noted during stress.  There is a medium defect of moderate severity present in the basal inferior, mid inferior and apical inferior location. The defect is non-reversible. This is consistent with either diaphragmatic attenuation artifact or infarct. No ischemia noted.  There is a medium defect of severe severity present in the apical septal, apical inferior and apex location. The defect is reversible and consistent with ischemia.  Findings consistent with ischemia.  The TID is 1.29 consistent with transient ischemic dilatation which could indicate underlying multivessel CAD.  This is a high risk study.  Nuclear stress EF: 36% but visually appears 20-25%.     Discharge Medications     Medication List    STOP taking these medications        carvedilol 6.25 MG tablet  Commonly known as:  COREG      TAKE these medications        amiodarone 200 MG tablet  Commonly known as:  PACERONE  Take 1 tablet (200 mg total) by mouth 2 (two) times daily.     apixaban 5 MG Tabs tablet  Commonly known as:  ELIQUIS  Take 1 tablet (5 mg total) by mouth 2 (two) times daily.     atorvastatin 80 MG tablet  Commonly known as:  LIPITOR  Take 1 tablet (80 mg total) by mouth daily at 6 PM.     furosemide 40 MG tablet  Commonly known as:  LASIX  Take 1 tablet (40 mg total) by mouth daily.  Start taking on:  05/28/2016     ranolazine 500 MG 12 hr tablet  Commonly known as:  RANEXA  Take 1 tablet (500 mg total) by mouth 2 (two) times daily.        Disposition   The patient will be discharged in stable condition to home.     Discharge Instructions    Diet - low sodium heart healthy    Complete by:  As directed      Heart Failure patients record your daily weight using the same scale at the same time of day    Complete by:  As directed       Increase activity slowly    Complete by:  As directed           Follow-up Information    Follow up with [name], MD On 06/06/2016.   Specialty:  Cardiology   Why:  at 10:30 Garage Code [id number]   Contact  information:   [address] [place] [address] [phone]         Duration of Discharge Encounter: Greater than 35 minutes   Signed, [name] NP-C  05/27/2016, 9:14 AM  Patient seen and examined with [name], NP. We discussed all aspects of the encounter. I agree with the assessment and plan as stated above.   He is ready for d/c. Agree with above.   Bensimhon, Daniel,MD 10:52 PM

## 2016-05-27 NOTE — Discharge Instructions (Signed)
Information on my medicine - ELIQUIS® (apixaban) ° °This medication education was reviewed with me or my healthcare representative as part of my discharge preparation.  The pharmacist that spoke with me during my hospital stay was:  Quanta Robertshaw Rhea, RPH ° °Why was Eliquis® prescribed for you? °Eliquis® was prescribed for you to reduce the risk of a blood clot forming that can cause a stroke if you have a medical condition called atrial fibrillation (a type of irregular heartbeat). ° °What do You need to know about Eliquis® ? °Take your Eliquis® TWICE DAILY - one tablet in the morning and one tablet in the evening with or without food. If you have difficulty swallowing the tablet whole please discuss with your pharmacist how to take the medication safely. ° °Take Eliquis® exactly as prescribed by your doctor and DO NOT stop taking Eliquis® without talking to the doctor who prescribed the medication.  Stopping may increase your risk of developing a stroke.  Refill your prescription before you run out. ° °After discharge, you should have regular check-up appointments with your healthcare provider that is prescribing your Eliquis®.  In the future your dose may need to be changed if your kidney function or weight changes by a significant amount or as you get older. ° °What do you do if you miss a dose? °If you miss a dose, take it as soon as you remember on the same day and resume taking twice daily.  Do not take more than one dose of ELIQUIS at the same time to make up a missed dose. ° °Important Safety Information °A possible side effect of Eliquis® is bleeding. You should call your healthcare provider right away if you experience any of the following: °  Bleeding from an injury or your nose that does not stop. °  Unusual colored urine (red or dark brown) or unusual colored stools (red or black). °  Unusual bruising for unknown reasons. °  A serious fall or if you hit your head (even if there is no  bleeding). ° °Some medicines may interact with Eliquis® and might increase your risk of bleeding or clotting while on Eliquis®. To help avoid this, consult your healthcare provider or pharmacist prior to using any new prescription or non-prescription medications, including herbals, vitamins, non-steroidal anti-inflammatory drugs (NSAIDs) and supplements. ° °This website has more information on Eliquis® (apixaban): http://www.eliquis.com/eliquis/home ° °

## 2016-05-27 NOTE — Progress Notes (Signed)
CARDIAC REHAB PHASE I   PRE:  Rate/Rhythm: 63 SR    BP: sitting 120/78    SaO2: 100 RA  MODE:  Ambulation: 350 ft   POST:  Rate/Rhythm: 80 SR    BP: sitting 132/102, recheck 140/97     SaO2: 94 RA  Pt with somewhat shuffled gait due to slip on shoes. No LOB. C/o dizziness with walking, BP elevated after walk. Discussed MI, HF, daily wts, low sodium with pt and wife. Receptive. Discussed his Hgb A1C and encouraged him to discuss with MD. Jovita Gamma DM diet. N/a for CRPII at this time due to need for cath.  [phone]   Harriet Masson CES, ACSM 05/27/2016 11:22 AM

## 2016-06-06 ENCOUNTER — Ambulatory Visit (HOSPITAL_COMMUNITY)
Admit: 2016-06-06 | Discharge: 2016-06-06 | Disposition: A | Discharge status: Home / Self Care | Payer: Medicare Other | Source: Ambulatory Visit | Attending: Internal Medicine | Admitting: Internal Medicine

## 2016-06-06 ENCOUNTER — Encounter (HOSPITAL_COMMUNITY): Payer: Self-pay | Admitting: Internal Medicine

## 2016-06-06 VITALS — BP 118/72 | HR 79 | Wt 188.0 lb

## 2016-06-06 DIAGNOSIS — I4891 Unspecified atrial fibrillation: Secondary | ICD-10-CM | POA: Diagnosis not present

## 2016-06-06 DIAGNOSIS — I5022 Chronic systolic (congestive) heart failure: Secondary | ICD-10-CM | POA: Diagnosis not present

## 2016-06-06 DIAGNOSIS — I4892 Unspecified atrial flutter: Secondary | ICD-10-CM | POA: Diagnosis not present

## 2016-06-06 DIAGNOSIS — N183 Chronic kidney disease, stage 3 unspecified: Secondary | ICD-10-CM

## 2016-06-06 DIAGNOSIS — Z79899 Other long term (current) drug therapy: Secondary | ICD-10-CM | POA: Insufficient documentation

## 2016-06-06 DIAGNOSIS — I2510 Atherosclerotic heart disease of native coronary artery without angina pectoris: Secondary | ICD-10-CM | POA: Insufficient documentation

## 2016-06-06 DIAGNOSIS — I130 Hypertensive heart and chronic kidney disease with heart failure and stage 1 through stage 4 chronic kidney disease, or unspecified chronic kidney disease: Secondary | ICD-10-CM | POA: Insufficient documentation

## 2016-06-06 LAB — BASIC METABOLIC PANEL
ANION GAP: 8 (ref 5–15)
BUN: 17 mg/dL (ref 6–20)
CHLORIDE: 103 mmol/L (ref 101–111)
CO2: 25 mmol/L (ref 22–32)
Calcium: 9 mg/dL (ref 8.9–10.3)
Creatinine, Ser: 1.33 mg/dL — ABNORMAL HIGH (ref 0.61–1.24)
GFR, EST NON AFRICAN AMERICAN: 54 mL/min — AB (ref >60)
Glucose, Bld: 84 mg/dL (ref 65–99)
POTASSIUM: 4.7 mmol/L (ref 3.5–5.1)
SODIUM: 136 mmol/L (ref 135–145)

## 2016-06-06 LAB — CBC
HEMATOCRIT: 45 % (ref 39.0–52.0)
Hemoglobin: 14.6 g/dL (ref 13.0–17.0)
MCH: 29.1 pg (ref 26.0–34.0)
MCHC: 32.4 g/dL (ref 30.0–36.0)
MCV: 89.8 fL (ref 78.0–100.0)
Platelets: 240 10*3/uL (ref 150–400)
RBC: 5.01 MIL/uL (ref 4.22–5.81)
RDW: 14.6 % (ref 11.5–15.5)
WBC: 5.1 10*3/uL (ref 4.0–10.5)

## 2016-06-06 MED ORDER — APIXABAN 5 MG PO TABS: 5.0000 mg | ORAL_TABLET | Freq: Two times a day (BID) | ORAL | Status: DC

## 2016-06-06 MED ORDER — FUROSEMIDE 40 MG PO TABS: 40.0000 mg | ORAL_TABLET | Freq: Every day | ORAL | Status: DC

## 2016-06-06 NOTE — Progress Notes (Signed)
Spoke with patient and wife who state that patient has recently stopped taking his eliquis and ranexa because they were too expensive (last doses 05/27/16).   Patient only currently with Medicare A/B. Therefore, had patient fill out paperwork work for [employer] patient assistance program via the manufacturer (medication will not cost the patient anything). Also provided the patient with 2 weeks wort of eliquis samples and asked for his case to be reviewed by social work.  Medication Samples have been provided to the patient.  Drug name: Eliquis     Strength: 5mg    Qty: 28 tablets LOT: [id number]  Exp.Date: 09/13/16 and 06/13/16  Dosing instructions: Take 1 tablet PO BID   The patient has been instructed regarding the correct time, dose, and frequency of taking this medication, including desired effects and most common side effects.   Betania Dizon C. Marvis Moeller, PharmD Pharmacy Resident  Pager: [phone] 06/06/2016 11:38 AM

## 2016-06-06 NOTE — Patient Instructions (Addendum)
Restart Eliquis 5mg  twice daily.  Take an Extra 40mg  of Lasix for weight 190lbs or greater  Routine lab work today. Will notify you of abnormal results  You have been referred to Dr.Allred.  Follow up with Dr.Bensimhon in 6 weeks

## 2016-06-06 NOTE — Progress Notes (Signed)
Patient ID: Martin [name], male   DOB: 1946-02-06, 70 y.o.   MRN: [id number]     Advanced Heart Failure Clinic Note   Primary HF: Dr. Gala Romney   70 yo with history of prior cardiomyopathy presents for followup after admission for atypical atrial flutter with RVR and acute systolic CHF.  Patient was admitted in 4/13 with cardiogenic shock in the setting of atrial fibrillation, EF 30-35% by echo at that time.  With medical treatment, he improved, and later in 4/13, EF was up to normal range.  He was started on coumadin for atrial fibrillation but developed a rectus sheath hematoma (large) and anticoagulation was stopped. He was then lost to cardiology followup for several years.    Admitted to [facility] 05/17/16 with increased dyspnea progressively over the last few weeks. On admit creatine 1.7, K 4.4, BNP 905. Troponin 0.47 -> 25.87 -> 37.38. Due to worsening renal function and poor response to IV diuresis he transferred to [facility] for Advanced Heart Failure Team to manage. On arrival he was SOB at rest, 3+ lower extremity edema, hypotension, A fib RVR, and cool extremities. Had central access placed to assess mixed venous saturation and guide diuresis. Placed on milrinone for cardiogenic shock as well as norepi and amio drip. Diuresed with IV lasix and once euvolemic he transitioned to lasix 40 mg daily. Overall he diuresed 29 pounds. Discharge weight 182 lbs.   Failed TEE/DC-CV 05/20/16  for persistent A fib on IV amio. s/p successful DCCV 05/25/16 once euvolemic he had successful cardioversion. Heparin was stopped and he was started on eliquis. He is agreeable to to short term anticoagulation.   He returns today for post hospital follow up.  Feeling great.  Stopped amio with rash (had in the past). Did not get Eliquis or ranexa with lack of prescription coverage and couldn't afford. Breathing is good. Can walk around [employer] without having to stop. Denies SOB on inclines.  No orthopnea or CP. Continues to  pee very well on po lasix. Weight stable at 186 at home.   Labs (3/16): K 3.4, creatinine 2.34 Labs (04/01/2015) K 3.9 Creatinine 1.47  Labs (05/27/16) K 4.5, Creatinine 2.15  Past Medical History  Diagnosis Date  . Essential hypertension, benign   . Atrial fibrillation (HCC)   . Cardiomyopathy (HCC)     Transient cardiomyopathy with EF 30-35% 03/27/12, improved to 55-60% 04/16/12  . Acute renal failure (HCC)     03/2012 (not on ACEI due to this) - renal US unremarkable  . Rectus sheath hematoma     Spontaneous during 03/2012 hospitalization managed conservatively with blood products  . Respiratory failure (HCC)     VDRF 03/2012 in setting of CHF  . Acute blood loss anemia     Due to rectus sheath hematoma 03/2012  . Unspecified sleep apnea     Suspected during 03/2012 hospitalization   . Hematuria 03/2012  . Hyperglycemia 03/2012  . Shock (HCC)     03/2012 hospitalization - Cardiogenic shock requiring pressors initially, followed by hemorrhagic shock later in hospitalization  . Pleural effusion 03/2012  . CHF (congestive heart failure) (HCC)      SH: Married, nonsmoker, occasional ETOH, lives in [place].   FH; No premature CAD.  No cardiomyopathy.   ROS: All systems reviewed and negative except as per HPI.   Current Outpatient Prescriptions  Medication Sig Dispense Refill  . atorvastatin (LIPITOR) 80 MG tablet Take 1 tablet (80 mg total) by mouth daily at 6 PM.  30 tablet 6  . furosemide (LASIX) 40 MG tablet Take 1 tablet (40 mg total) by mouth daily. 30 tablet 6   No current facility-administered medications for this encounter.   BP 118/72 mmHg  Pulse 79  Wt 188 lb (85.276 kg)  SpO2 99% General: In bed. NAD HEENT: Normal except for poor dentition Neck: supple. JVP 7-8. Carotids 2+ bilat; no bruits. No thyromegaly or nodule noted.  Cor: RRR, no murmurs appreciated.   Lungs: CTAB, normal effort Abdomen: soft, NT, mild/mod distended no HSM. No bruits or masses. +BS + mass in  R adbomen Extremities: no cyanosis, clubbing, rash. 1+ ankle edema Neuro: alert & orientedx3, cranial nerves grossly intact. moves all 4 extremities w/o difficulty. Affect pleasant  EKG: NSR 74 bpm, QTc 426  Assessment/Plan: 1. Chronic systolic CHF: Suspect nonischemic cardiomyopathy. ECHO 06/04/2015 showed EF 55-60%  Improved in the past with termination of atrial fibrillation.  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   NYHA II.  - Volume status stable, but trending up slightly.  Continue 40 mg lasix daily. Take an extra 40 mg as needed for weights of 190 lbs or greater by his home scale.    - No ACEI or spironolactone with elevated creatinine. 2. Rash: Erythematous patch-like rash on trunk with pruritis. -  Resolved once he stopped amio.  3. Atrial flutter/atrial fibrillation:  - s/p dccv 05/25/16  - Sent home on ranexa and amio, had to stop amio due to rash. Never started ranexa due to financial reasons.  - EKG today shows NSR 74 bpm.  - Resume eliquis at 5 mg BID. CBC today.  - He has previously refused anticoag with previous rectus sheath bleed, but has agreed to short term.  - Refer to Dr Johney Frame for rhythm control. Can't use amio with rash, or tikosyn with Creatinine. m 4. CAD On admit troponin peaked 37. No LHC with elevated creatinine but Lexiscan with concern for ischemia--> apical septal, apical inferior and apex location.  - Will check BMET today with plans for eventual LHC.  - Continue atorvastatin. Attempting to get pt assistance program for Eliquis.  statin and eliquis.  - No BB with cardiogenic shock  4. CKD Stage III:  - Initially renal function worsened but with IV milrinone this improved over time. Creatinine peaked at 3.2 and trended down to 2.15. He will not be on Ace/spiro for this reason. BMET today.  5. Suspect OSA:  Was set up for sleep study but he cancelled and because he decided he does not want to pursue.  Discussed sliding scale diuretics.  Resume  Eliquis.  BMET/CBC today.  Will make referral to see Dr Johney Frame as soon as possible for consideration of AV node ablation and CRT-P.  Pt decompensates very quickly in Afib.  Follow up 6 weeks   Graciella Freer PA-C  06/06/2016   Patient seen and examined with Otilio Saber, PA-C. We discussed all aspects of the encounter. I agree with the assessment and plan as stated above.   He is stable post-hospitalization. Volume status looks good. Maintaining NSR. Unfortunately he has run out of his Eliquis and had a rash with amio. Given CKD and systolic HF no other good anti-arrhythmic options. It seems as if he is allergic to oral amio but no IV will ask pharmacy about formulation. As her decompensated rapidly with AF will refer to EP for possible AV node ablation and BiV pacing.   Vianka Ertel,MD 11:11 PM

## 2016-06-07 ENCOUNTER — Telehealth: Payer: Self-pay | Admitting: Licensed Clinical Social Worker

## 2016-06-07 NOTE — Telephone Encounter (Signed)
CSW referred to assist with information on Medicare D. Patient reports he never applied for Medicare D when he became medicare eligible. Patient has been on medicare for close to 4 years as he reports. CSW encouraged patient to call [employer] to follow up on appropriate Medicare D plan and assistance with application. Patient's wife took information and verbalizes understanding and will follow up. CSW available as needed. Lasandra Beech, LCSW [phone]

## 2016-06-13 ENCOUNTER — Encounter: Payer: Self-pay | Admitting: Internal Medicine

## 2016-06-15 ENCOUNTER — Institutional Professional Consult (permissible substitution): Payer: Medicare Other | Admitting: Internal Medicine

## 2016-07-05 ENCOUNTER — Telehealth (HOSPITAL_COMMUNITY): Payer: Self-pay | Admitting: Vascular Surgery

## 2016-07-05 NOTE — Telephone Encounter (Signed)
Left pt message to reschedule appt 07/22/16 or poss. Keep appt and see Mclean

## 2016-07-19 ENCOUNTER — Telehealth (HOSPITAL_COMMUNITY): Payer: Self-pay | Admitting: Vascular Surgery

## 2016-07-19 NOTE — Telephone Encounter (Signed)
Left pt message to resch appt per DB

## 2016-07-22 ENCOUNTER — Encounter (HOSPITAL_COMMUNITY): Payer: Medicare Other | Admitting: Internal Medicine

## 2016-07-22 ENCOUNTER — Encounter (HOSPITAL_COMMUNITY): Payer: Medicare Other

## 2016-07-28 ENCOUNTER — Encounter (HOSPITAL_COMMUNITY): Payer: Medicare Other | Admitting: Internal Medicine

## 2016-09-14 ENCOUNTER — Ambulatory Visit (HOSPITAL_COMMUNITY)
Admission: RE | Admit: 2016-09-14 | Discharge: 2016-09-14 | Disposition: A | Discharge status: Home / Self Care | Payer: Medicare Other | Source: Ambulatory Visit | Attending: Internal Medicine | Admitting: Internal Medicine

## 2016-09-14 VITALS — BP 118/74 | HR 69 | Wt 187.2 lb

## 2016-09-14 DIAGNOSIS — Z79899 Other long term (current) drug therapy: Secondary | ICD-10-CM | POA: Insufficient documentation

## 2016-09-14 DIAGNOSIS — I4891 Unspecified atrial fibrillation: Secondary | ICD-10-CM | POA: Insufficient documentation

## 2016-09-14 DIAGNOSIS — I130 Hypertensive heart and chronic kidney disease with heart failure and stage 1 through stage 4 chronic kidney disease, or unspecified chronic kidney disease: Secondary | ICD-10-CM | POA: Diagnosis not present

## 2016-09-14 DIAGNOSIS — N183 Chronic kidney disease, stage 3 unspecified: Secondary | ICD-10-CM

## 2016-09-14 DIAGNOSIS — I2510 Atherosclerotic heart disease of native coronary artery without angina pectoris: Secondary | ICD-10-CM | POA: Diagnosis not present

## 2016-09-14 DIAGNOSIS — Z7901 Long term (current) use of anticoagulants: Secondary | ICD-10-CM | POA: Insufficient documentation

## 2016-09-14 DIAGNOSIS — I5022 Chronic systolic (congestive) heart failure: Secondary | ICD-10-CM | POA: Diagnosis not present

## 2016-09-14 DIAGNOSIS — R21 Rash and other nonspecific skin eruption: Secondary | ICD-10-CM | POA: Diagnosis not present

## 2016-09-14 DIAGNOSIS — I429 Cardiomyopathy, unspecified: Secondary | ICD-10-CM | POA: Insufficient documentation

## 2016-09-14 DIAGNOSIS — R040 Epistaxis: Secondary | ICD-10-CM | POA: Diagnosis not present

## 2016-09-14 DIAGNOSIS — I480 Paroxysmal atrial fibrillation: Secondary | ICD-10-CM | POA: Diagnosis not present

## 2016-09-14 MED ORDER — CARVEDILOL 3.125 MG PO TABS: 3.1250 mg | ORAL_TABLET | Freq: Two times a day (BID) | ORAL | 3 refills | Status: DC

## 2016-09-14 NOTE — Patient Instructions (Signed)
Start Carvedilol 3.125 mg Twice daily   Your physician recommends that you schedule a follow-up appointment in: 6 weeks  

## 2016-09-14 NOTE — Progress Notes (Signed)
Patient ID: Martin [name], male   DOB: 04-23-1946, 70 y.o.   MRN: [id number]     Advanced Heart Failure Clinic Note   Primary HF: Dr. Gala Romney   70 yo with history of prior cardiomyopathy presents for followup after admission for atypical atrial flutter with RVR and acute systolic CHF.  Patient was admitted in 4/13 with cardiogenic shock in the setting of atrial fibrillation, EF 30-35% by echo at that time.  With medical treatment, he improved, and later in 4/13, EF was up to normal range.  He was started on coumadin for atrial fibrillation but developed a rectus sheath hematoma (large) and anticoagulation was stopped. He was then lost to cardiology followup for several years.    Admitted to [facility] 05/17/16 with increased dyspnea progressively over the last few weeks. On admit creatine 1.7, K 4.4, BNP 905. Troponin 0.47 -> 25.87 -> 37.38. Due to worsening renal function and poor response to IV diuresis he transferred to [facility] for Advanced Heart Failure Team to manage. On arrival he was SOB at rest, 3+ lower extremity edema, hypotension, A fib RVR, and cool extremities. Had central access placed to assess mixed venous saturation and guide diuresis. Placed on milrinone for cardiogenic shock as well as norepi and amio drip. Diuresed with IV lasix and once euvolemic he transitioned to lasix 40 mg daily. Overall he diuresed 29 pounds. Discharge weight 182 lbs.   Failed TEE/DC-CV 05/20/16  for persistent A fib on IV amio. s/p successful DCCV 05/25/16 once euvolemic he had successful cardioversion. Heparin was stopped and he was started on eliquis. He is agreeable to to short term anticoagulation.   He returns today for HF follow up.  Since the last visit he stopped eliquis due to nose bleed and says he will not take another blood thinner. Overall feeling great. Denies SOB/PND/Orthopnea.  Weight at home 182 pounds. stable at 186 at home.   Labs (3/16): K 3.4, creatinine 2.34 Labs (04/01/2015) K 3.9  Creatinine 1.47  Labs (05/27/16) K 4.5, Creatinine 2.15 Labs 06/06/2016: K 4.7 Creatinine 1.33  Past Medical History:  Diagnosis Date  . Acute blood loss anemia    Due to rectus sheath hematoma 03/2012  . Acute renal failure (HCC)    03/2012 (not on ACEI due to this) - renal US unremarkable  . Atrial fibrillation (HCC)   . Cardiomyopathy (HCC)    Transient cardiomyopathy with EF 30-35% 03/27/12, improved to 55-60% 04/16/12  . CHF (congestive heart failure) (HCC)   . Essential hypertension, benign   . Hematuria 03/2012  . Hyperglycemia 03/2012  . Pleural effusion 03/2012  . Rectus sheath hematoma    Spontaneous during 03/2012 hospitalization managed conservatively with blood products  . Respiratory failure (HCC)    VDRF 03/2012 in setting of CHF  . Shock (HCC)    03/2012 hospitalization - Cardiogenic shock requiring pressors initially, followed by hemorrhagic shock later in hospitalization  . Unspecified sleep apnea    Suspected during 03/2012 hospitalization      SH: Married, nonsmoker, occasional ETOH, lives in [place].   FH; No premature CAD.  No cardiomyopathy.   ROS: All systems reviewed and negative except as per HPI.   Current Outpatient Prescriptions  Medication Sig Dispense Refill  . apixaban (ELIQUIS) 5 MG TABS tablet Take 1 tablet (5 mg total) by mouth 2 (two) times daily. 180 tablet 3  . atorvastatin (LIPITOR) 80 MG tablet Take 1 tablet (80 mg total) by mouth daily at 6 PM. 30 tablet  6  . furosemide (LASIX) 40 MG tablet Take 1 tablet (40 mg total) by mouth daily. Take an extra 40mg  (1 tablet) for weight 190lbs or greater 15 tablet 6   No current facility-administered medications for this encounter.    BP 118/74 (BP Location: Right Arm, Patient Position: Sitting, Cuff Size: Normal)   Pulse 69   Wt 187 lb 4 oz (84.9 kg)   SpO2 98%   BMI 31.16 kg/m  General: NAD. Ambulated in the clinic HEENT: Normal except for poor dentition Neck: supple. JVP flat. Carotids 2+ bilat; no  bruits. No thyromegaly or nodule noted.  Cor: RRR, no murmurs appreciated.   Lungs: CTAB, normal effort Abdomen: soft, NT, mild/mod distended no HSM. No bruits or masses. +BS + mass in R adbomen Extremities: no cyanosis, clubbing, rash. No edema.  Neuro: alert & orientedx3, cranial nerves grossly intact. moves all 4 extremities w/o difficulty. Affect pleasant    Assessment/Plan: 1. Chronic systolic CHF: Suspect nonischemic cardiomyopathy. ECHO 06/04/2015 showed EF 55-60%  Improved in the past with termination of atrial fibrillation.  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 Repeat ECHO.   NYHA II. - Volume status stable. Continue 40 mg lasix daily.  - Add low dose bb, carvedilol 3.125 mg twice a day.  - Would like to add ARB or spiro but he would like to hold off. . 2. Rash: Erythematous patch-like rash on trunk with pruritis. -  Resolved once he stopped amio.  3. Atrial flutter/atrial fibrillation:  - s/p dccv 05/25/16  - Intolerant and amio. Never started ranexa due to financial reasons.  - Stopped eliquis due to nose bleed. Refuses to take anticoagulants.   - Offered referral to Dr Johney FrameAllred ablation however he refuses.  Can't use amio with rash, or tikosyn with Creatinine.  4. CAD On admit troponin peaked 37. No LHC with elevated creatinine but Lexiscan with concern for ischemia--> apical septal, apical inferior and apex location.  - Will check BMET today with plans for eventual LHC.  - Continue atorvastatin.  4. CKD Stage III:  - Initially renal function worsened but with IV milrinone this improved over time. Creatinine peaked at 3.2 and trended down to 2.15. Creatinine back down to 1.33. He wants to hold off on ARB/spiro for now.   5. Suspect OSA:  Was set up for sleep study but he cancelled.    Follow up 6 weeks. Plan to repeat ECHO.    [name] NP-C  09/14/2016

## 2016-10-26 ENCOUNTER — Encounter (HOSPITAL_COMMUNITY): Payer: Medicare Other | Admitting: Internal Medicine

## 2016-11-03 ENCOUNTER — Ambulatory Visit (HOSPITAL_COMMUNITY)
Admission: RE | Admit: 2016-11-03 | Discharge: 2016-11-03 | Disposition: A | Discharge status: Home / Self Care | Payer: Medicare Other | Source: Ambulatory Visit | Attending: Internal Medicine | Admitting: Internal Medicine

## 2016-11-03 ENCOUNTER — Ambulatory Visit (HOSPITAL_BASED_OUTPATIENT_CLINIC_OR_DEPARTMENT_OTHER)
Admission: RE | Admit: 2016-11-03 | Discharge: 2016-11-03 | Disposition: A | Discharge status: Home / Self Care | Payer: Medicare Other | Source: Ambulatory Visit | Attending: Internal Medicine | Admitting: Internal Medicine

## 2016-11-03 ENCOUNTER — Encounter (HOSPITAL_COMMUNITY): Payer: Self-pay | Admitting: Internal Medicine

## 2016-11-03 VITALS — BP 132/80 | HR 66 | Wt 189.0 lb

## 2016-11-03 DIAGNOSIS — I429 Cardiomyopathy, unspecified: Secondary | ICD-10-CM | POA: Diagnosis not present

## 2016-11-03 DIAGNOSIS — I5022 Chronic systolic (congestive) heart failure: Secondary | ICD-10-CM

## 2016-11-03 DIAGNOSIS — N183 Chronic kidney disease, stage 3 (moderate): Secondary | ICD-10-CM | POA: Insufficient documentation

## 2016-11-03 DIAGNOSIS — I2510 Atherosclerotic heart disease of native coronary artery without angina pectoris: Secondary | ICD-10-CM | POA: Diagnosis not present

## 2016-11-03 DIAGNOSIS — I4891 Unspecified atrial fibrillation: Secondary | ICD-10-CM | POA: Insufficient documentation

## 2016-11-03 DIAGNOSIS — R739 Hyperglycemia, unspecified: Secondary | ICD-10-CM | POA: Insufficient documentation

## 2016-11-03 DIAGNOSIS — R21 Rash and other nonspecific skin eruption: Secondary | ICD-10-CM | POA: Diagnosis not present

## 2016-11-03 DIAGNOSIS — I5021 Acute systolic (congestive) heart failure: Secondary | ICD-10-CM | POA: Diagnosis not present

## 2016-11-03 DIAGNOSIS — G4730 Sleep apnea, unspecified: Secondary | ICD-10-CM | POA: Insufficient documentation

## 2016-11-03 DIAGNOSIS — R040 Epistaxis: Secondary | ICD-10-CM | POA: Diagnosis not present

## 2016-11-03 DIAGNOSIS — I130 Hypertensive heart and chronic kidney disease with heart failure and stage 1 through stage 4 chronic kidney disease, or unspecified chronic kidney disease: Secondary | ICD-10-CM | POA: Diagnosis not present

## 2016-11-03 DIAGNOSIS — N179 Acute kidney failure, unspecified: Secondary | ICD-10-CM | POA: Diagnosis not present

## 2016-11-03 LAB — BASIC METABOLIC PANEL
Anion gap: 12 (ref 5–15)
BUN: 22 mg/dL — AB (ref 6–20)
CHLORIDE: 103 mmol/L (ref 101–111)
CO2: 23 mmol/L (ref 22–32)
Calcium: 9.7 mg/dL (ref 8.9–10.3)
Creatinine, Ser: 1.29 mg/dL — ABNORMAL HIGH (ref 0.61–1.24)
GFR calc Af Amer: >60 mL/min (ref >60)
GFR calc non Af Amer: 55 mL/min — ABNORMAL LOW (ref >60)
GLUCOSE: 90 mg/dL (ref 65–99)
POTASSIUM: 4.9 mmol/L (ref 3.5–5.1)
Sodium: 138 mmol/L (ref 135–145)

## 2016-11-03 NOTE — Patient Instructions (Signed)
Routine lab work today. Will notify you of abnormal results  You have been referred to [facility] for Atrial Fibrillation.  Follow up with Dr.Bensimhon in 6 months.

## 2016-11-03 NOTE — Progress Notes (Signed)
Patient ID: Martin [name], male   DOB: 04-Feb-1946, 70 y.o.   MRN: [id number]     Advanced Heart Failure Clinic Note   Primary HF: Dr. Gala Romney   70 yo with history of prior cardiomyopathy presents for followup after admission for atypical atrial flutter with RVR and acute systolic CHF.  Patient was admitted in 4/13 with cardiogenic shock in the setting of atrial fibrillation, EF 30-35% by echo at that time.  With medical treatment, he improved, and later in 4/13, EF was up to normal range.  He was started on coumadin for atrial fibrillation but developed a rectus sheath hematoma (large) and anticoagulation was stopped. He was then lost to cardiology followup for several years.    Admitted to [facility] 05/17/16 with increased dyspnea progressively over the last few weeks. On admit creatine 1.7, K 4.4, BNP 905. Troponin 0.47 -> 25.87 -> 37.38. Due to worsening renal function and poor response to IV diuresis he transferred to [facility] for Advanced Heart Failure Team to manage. On arrival he was SOB at rest, 3+ lower extremity edema, hypotension, A fib RVR, and cool extremities. EF 20-25% Had central access placed to assess mixed venous saturation and guide diuresis. Placed on milrinone for cardiogenic shock as well as norepi and amio drip. Diuresed with IV lasix and once euvolemic he transitioned to lasix 40 mg daily. Overall he diuresed 29 pounds. Discharge weight 182 lbs.   Failed TEE/DC-CV 05/20/16  for persistent A fib on IV amio. s/p successful DCCV 05/25/16   He returns today for HF follow up.  He previouslystopped eliquis due to nose bleed and says he will not take another blood thinner. (Previously had large rectus sheath hematoma). Overall feeling very good. Denies SOB/PND/Orthopnea.  Weight at home 185pounds. Does all ADLs without problem.   ECHO today EF 40-45% Severe LAE  Labs (3/16): K 3.4, creatinine 2.34 Labs (04/01/2015) K 3.9 Creatinine 1.47  Labs (05/27/16) K 4.5, Creatinine 2.15 Labs  06/06/2016: K 4.7 Creatinine 1.33  Past Medical History:  Diagnosis Date  . Acute blood loss anemia    Due to rectus sheath hematoma 03/2012  . Acute renal failure (HCC)    03/2012 (not on ACEI due to this) - renal US unremarkable  . Atrial fibrillation (HCC)   . Cardiomyopathy (HCC)    Transient cardiomyopathy with EF 30-35% 03/27/12, improved to 55-60% 04/16/12  . CHF (congestive heart failure) (HCC)   . Essential hypertension, benign   . Hematuria 03/2012  . Hyperglycemia 03/2012  . Pleural effusion 03/2012  . Rectus sheath hematoma    Spontaneous during 03/2012 hospitalization managed conservatively with blood products  . Respiratory failure (HCC)    VDRF 03/2012 in setting of CHF  . Shock (HCC)    03/2012 hospitalization - Cardiogenic shock requiring pressors initially, followed by hemorrhagic shock later in hospitalization  . Unspecified sleep apnea    Suspected during 03/2012 hospitalization      SH: Married, nonsmoker, occasional ETOH, lives in [place].   FH; No premature CAD.  No cardiomyopathy.   ROS: All systems reviewed and negative except as per HPI.   Current Outpatient Prescriptions  Medication Sig Dispense Refill  . atorvastatin (LIPITOR) 80 MG tablet Take 1 tablet (80 mg total) by mouth daily at 6 PM. 30 tablet 6  . carvedilol (COREG) 3.125 MG tablet Take 1 tablet (3.125 mg total) by mouth 2 (two) times daily. 60 tablet 3  . furosemide (LASIX) 40 MG tablet Take 1 tablet (40 mg  total) by mouth daily. Take an extra 40mg  (1 tablet) for weight 190lbs or greater 15 tablet 6   No current facility-administered medications for this encounter.    Wt Readings from Last 3 Encounters:  11/03/16 189 lb (85.7 kg)  09/14/16 187 lb 4 oz (84.9 kg)  06/06/16 188 lb (85.3 kg)     BP 132/80   Pulse 66   Wt 189 lb (85.7 kg)   SpO2 98%   BMI 31.45 kg/m  General: NAD. Ambulated in the clinic HEENT: Normal except for poor dentition Neck: supple. JVP flat. Carotids 2+ bilat; no  bruits. No thyromegaly or nodule noted.  Cor: RRR, no murmurs appreciated.   Lungs: CTAB, normal effort Abdomen: soft, NT, mild/mod distended no HSM. No bruits or masses. +BS + mass in R adbomen Extremities: no cyanosis, clubbing, rash. No edema.  Neuro: alert & orientedx3, cranial nerves grossly intact. moves all 4 extremities w/o difficulty. Affect pleasant    Assessment/Plan: 1. Chronic systolic CHF: Suspect nonischemic cardiomyopathy. ECHO 06/04/2015 showed EF 55-60%  Improved in the past with termination of atrial fibrillation.  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 Echo to 40-45%.   NYHA II. - Volume status stable. Continue 40 mg lasix daily.  - Continue carvedilol 3.125 mg twice a day.  - Would like to add ARB or spiro but he refuses further medicine titration. 2. Rash: Erythematous patch-like rash on trunk with pruritis. -  Resolved once he stopped amio.  3. Atrial flutter/atrial fibrillation:  - s/p dccv 05/25/16  - Intolerant of amio.  - Stopped eliquis due to nose bleed. Refuses to take anticoagulants.   - Long talk about likely recurrence of AF down the road and fact that he usually goes into shock when this happens. Discussed using another AA or considering ablation (though LA is big). He is reluctant to do much here but willing to discuss with Dr. Johney FrameAllred. 4. CAD On admit troponin peaked 37. No LHC with elevated creatinine but Lexiscan with concern for ischemia--> apical septal, apical inferior and apex location.  - He refuses LHC or statin.  4. CKD Stage III:  - Initially renal function worsened but with IV milrinone this improved over time. Creatinine peaked at 3.2 and trended down to 2.15. Creatinine back down to 1.33. Recehck today 5. Suspect OSA:  Was set up for sleep study but he cancelled.    Follow up 6 months   Arvilla MeresBensimhon, Georgianne Gritz MD 11/03/2016

## 2016-11-03 NOTE — Progress Notes (Signed)
  Echocardiogram 2D Echocardiogram has been performed.  Cathie Beams 11/03/2016, 2:52 PM

## 2016-11-29 ENCOUNTER — Encounter: Payer: Self-pay | Admitting: Internal Medicine

## 2017-01-31 ENCOUNTER — Inpatient Hospital Stay (HOSPITAL_COMMUNITY)
Admission: EM | Admit: 2017-01-31 | Discharge: 2017-02-07 | DRG: 291 | Disposition: A | Discharge status: Home / Self Care | Payer: Medicare Other | Attending: Cardiovascular Disease | Admitting: Cardiovascular Disease

## 2017-01-31 ENCOUNTER — Emergency Department (HOSPITAL_COMMUNITY): Payer: Medicare Other

## 2017-01-31 ENCOUNTER — Encounter (HOSPITAL_COMMUNITY): Payer: Self-pay | Admitting: Emergency Medicine

## 2017-01-31 DIAGNOSIS — R570 Cardiogenic shock: Secondary | ICD-10-CM | POA: Diagnosis present

## 2017-01-31 DIAGNOSIS — I5033 Acute on chronic diastolic (congestive) heart failure: Secondary | ICD-10-CM | POA: Diagnosis not present

## 2017-01-31 DIAGNOSIS — I428 Other cardiomyopathies: Secondary | ICD-10-CM | POA: Diagnosis present

## 2017-01-31 DIAGNOSIS — R21 Rash and other nonspecific skin eruption: Secondary | ICD-10-CM | POA: Diagnosis present

## 2017-01-31 DIAGNOSIS — I5043 Acute on chronic combined systolic (congestive) and diastolic (congestive) heart failure: Secondary | ICD-10-CM | POA: Diagnosis present

## 2017-01-31 DIAGNOSIS — I1310 Hypertensive heart and chronic kidney disease without heart failure, with stage 1 through stage 4 chronic kidney disease, or unspecified chronic kidney disease: Secondary | ICD-10-CM | POA: Diagnosis not present

## 2017-01-31 DIAGNOSIS — I2510 Atherosclerotic heart disease of native coronary artery without angina pectoris: Secondary | ICD-10-CM | POA: Diagnosis not present

## 2017-01-31 DIAGNOSIS — L299 Pruritus, unspecified: Secondary | ICD-10-CM | POA: Diagnosis present

## 2017-01-31 DIAGNOSIS — Z7982 Long term (current) use of aspirin: Secondary | ICD-10-CM

## 2017-01-31 DIAGNOSIS — N183 Chronic kidney disease, stage 3 unspecified: Secondary | ICD-10-CM | POA: Diagnosis present

## 2017-01-31 DIAGNOSIS — Z9114 Patient's other noncompliance with medication regimen: Secondary | ICD-10-CM | POA: Diagnosis not present

## 2017-01-31 DIAGNOSIS — I130 Hypertensive heart and chronic kidney disease with heart failure and stage 1 through stage 4 chronic kidney disease, or unspecified chronic kidney disease: Secondary | ICD-10-CM | POA: Diagnosis not present

## 2017-01-31 DIAGNOSIS — I252 Old myocardial infarction: Secondary | ICD-10-CM | POA: Diagnosis not present

## 2017-01-31 DIAGNOSIS — I484 Atypical atrial flutter: Secondary | ICD-10-CM | POA: Diagnosis present

## 2017-01-31 DIAGNOSIS — N179 Acute kidney failure, unspecified: Secondary | ICD-10-CM | POA: Diagnosis not present

## 2017-01-31 DIAGNOSIS — I4891 Unspecified atrial fibrillation: Secondary | ICD-10-CM | POA: Diagnosis not present

## 2017-01-31 DIAGNOSIS — I255 Ischemic cardiomyopathy: Secondary | ICD-10-CM | POA: Diagnosis present

## 2017-01-31 DIAGNOSIS — Z7901 Long term (current) use of anticoagulants: Secondary | ICD-10-CM

## 2017-01-31 DIAGNOSIS — N170 Acute kidney failure with tubular necrosis: Secondary | ICD-10-CM | POA: Diagnosis not present

## 2017-01-31 DIAGNOSIS — N189 Chronic kidney disease, unspecified: Secondary | ICD-10-CM

## 2017-01-31 DIAGNOSIS — Z888 Allergy status to other drugs, medicaments and biological substances status: Secondary | ICD-10-CM

## 2017-01-31 DIAGNOSIS — I5023 Acute on chronic systolic (congestive) heart failure: Secondary | ICD-10-CM | POA: Diagnosis not present

## 2017-01-31 DIAGNOSIS — R002 Palpitations: Secondary | ICD-10-CM | POA: Diagnosis not present

## 2017-01-31 DIAGNOSIS — I952 Hypotension due to drugs: Secondary | ICD-10-CM | POA: Diagnosis not present

## 2017-01-31 DIAGNOSIS — I480 Paroxysmal atrial fibrillation: Secondary | ICD-10-CM | POA: Diagnosis present

## 2017-01-31 DIAGNOSIS — I429 Cardiomyopathy, unspecified: Secondary | ICD-10-CM | POA: Diagnosis present

## 2017-01-31 DIAGNOSIS — I481 Persistent atrial fibrillation: Secondary | ICD-10-CM | POA: Diagnosis present

## 2017-01-31 DIAGNOSIS — T461X5A Adverse effect of calcium-channel blockers, initial encounter: Secondary | ICD-10-CM | POA: Diagnosis not present

## 2017-01-31 DIAGNOSIS — I509 Heart failure, unspecified: Secondary | ICD-10-CM

## 2017-01-31 DIAGNOSIS — I5021 Acute systolic (congestive) heart failure: Secondary | ICD-10-CM | POA: Diagnosis not present

## 2017-01-31 DIAGNOSIS — R0602 Shortness of breath: Secondary | ICD-10-CM | POA: Diagnosis not present

## 2017-01-31 DIAGNOSIS — I340 Nonrheumatic mitral (valve) insufficiency: Secondary | ICD-10-CM | POA: Diagnosis not present

## 2017-01-31 LAB — BASIC METABOLIC PANEL
ANION GAP: 11 (ref 5–15)
BUN: 42 mg/dL — AB (ref 6–20)
CHLORIDE: 103 mmol/L (ref 101–111)
CO2: 26 mmol/L (ref 22–32)
Calcium: 9.2 mg/dL (ref 8.9–10.3)
Creatinine, Ser: 1.98 mg/dL — ABNORMAL HIGH (ref 0.61–1.24)
GFR calc Af Amer: 38 mL/min — ABNORMAL LOW (ref >60)
GFR, EST NON AFRICAN AMERICAN: 33 mL/min — AB (ref >60)
Glucose, Bld: 154 mg/dL — ABNORMAL HIGH (ref 65–99)
POTASSIUM: 4.3 mmol/L (ref 3.5–5.1)
SODIUM: 140 mmol/L (ref 135–145)

## 2017-01-31 LAB — I-STAT CHEM 8, ED
BUN: 62 mg/dL — AB (ref 6–20)
CHLORIDE: 102 mmol/L (ref 101–111)
CREATININE: 2.1 mg/dL — AB (ref 0.61–1.24)
Calcium, Ion: 1.05 mmol/L — ABNORMAL LOW (ref 1.15–1.40)
Glucose, Bld: 204 mg/dL — ABNORMAL HIGH (ref 65–99)
HEMATOCRIT: 53 % — AB (ref 39.0–52.0)
Hemoglobin: 18 g/dL — ABNORMAL HIGH (ref 13.0–17.0)
POTASSIUM: 7.1 mmol/L — AB (ref 3.5–5.1)
SODIUM: 137 mmol/L (ref 135–145)
TCO2: 28 mmol/L (ref 0–100)

## 2017-01-31 LAB — CBC
HEMATOCRIT: 48.8 % (ref 39.0–52.0)
HEMOGLOBIN: 16.2 g/dL (ref 13.0–17.0)
MCH: 31.5 pg (ref 26.0–34.0)
MCHC: 33.2 g/dL (ref 30.0–36.0)
MCV: 94.8 fL (ref 78.0–100.0)
Platelets: 153 10*3/uL (ref 150–400)
RBC: 5.15 MIL/uL (ref 4.22–5.81)
RDW: 14.8 % (ref 11.5–15.5)
WBC: 7.9 10*3/uL (ref 4.0–10.5)

## 2017-01-31 LAB — HEPATIC FUNCTION PANEL
ALBUMIN: 3.4 g/dL — AB (ref 3.5–5.0)
ALT: 122 U/L — AB (ref 17–63)
AST: 78 U/L — AB (ref 15–41)
Alkaline Phosphatase: 85 U/L (ref 38–126)
Bilirubin, Direct: 0.6 mg/dL — ABNORMAL HIGH (ref 0.1–0.5)
Indirect Bilirubin: 1.2 mg/dL — ABNORMAL HIGH (ref 0.3–0.9)
TOTAL PROTEIN: 6.7 g/dL (ref 6.5–8.1)
Total Bilirubin: 1.8 mg/dL — ABNORMAL HIGH (ref 0.3–1.2)

## 2017-01-31 LAB — I-STAT TROPONIN, ED: Troponin i, poc: 0.13 ng/mL (ref 0.00–0.08)

## 2017-01-31 LAB — BRAIN NATRIURETIC PEPTIDE: B Natriuretic Peptide: 1005 pg/mL — ABNORMAL HIGH (ref 0.0–100.0)

## 2017-01-31 MED ORDER — METOPROLOL TARTRATE 5 MG/5ML IV SOLN
5.0000 mg | Freq: Once | INTRAVENOUS | Status: AC
  Administered 2017-01-31: 5 mg via INTRAVENOUS
  Filled 2017-01-31: qty 5

## 2017-01-31 MED ORDER — ADENOSINE 6 MG/2ML IV SOLN
INTRAVENOUS | Status: AC
  Filled 2017-01-31: qty 2

## 2017-01-31 MED ORDER — DILTIAZEM HCL-DEXTROSE 100-5 MG/100ML-% IV SOLN (PREMIX)
INTRAVENOUS | Status: AC
  Filled 2017-01-31: qty 100

## 2017-01-31 MED ORDER — ALBUTEROL SULFATE (2.5 MG/3ML) 0.083% IN NEBU: 5.0000 mg | INHALATION_SOLUTION | Freq: Once | RESPIRATORY_TRACT | Status: DC

## 2017-01-31 MED ORDER — DILTIAZEM HCL-DEXTROSE 100-5 MG/100ML-% IV SOLN (PREMIX)
5.0000 mg/h | Freq: Once | INTRAVENOUS | Status: AC
  Administered 2017-01-31: 5 mg/h via INTRAVENOUS
  Administered 2017-02-01: 15 mg/h via INTRAVENOUS

## 2017-01-31 MED ORDER — DILTIAZEM HCL 25 MG/5ML IV SOLN
10.0000 mg | Freq: Once | INTRAVENOUS | Status: AC
  Administered 2017-01-31: 10 mg via INTRAVENOUS

## 2017-01-31 MED ORDER — SODIUM CHLORIDE 0.9 % IV BOLUS (SEPSIS)
250.0000 mL | Freq: Once | INTRAVENOUS | Status: AC
  Administered 2017-01-31: 250 mL via INTRAVENOUS

## 2017-01-31 MED ORDER — DILTIAZEM HCL 25 MG/5ML IV SOLN
10.0000 mg | Freq: Once | INTRAVENOUS | Status: AC
  Administered 2017-01-31: 10 mg via INTRAVENOUS
  Filled 2017-01-31: qty 5

## 2017-01-31 MED ORDER — DILTIAZEM HCL 25 MG/5ML IV SOLN: 5.0000 mg | Freq: Once | INTRAVENOUS | Status: DC

## 2017-01-31 MED ORDER — SODIUM CHLORIDE 0.9 % IV SOLN
Freq: Once | INTRAVENOUS | Status: AC
  Administered 2017-02-03: 11:00:00 via INTRAVENOUS

## 2017-01-31 NOTE — ED Notes (Addendum)
CRITICAL VALUE ALERT  Critical value received:  Troponin 0.13  Date of notification:  01/31/17  Time of notification:  1808  Critical value read back:Yes.    Nurse who received alert:  Fransico Him, RN  MD notified (1st page):  Dr. Estell Harpin  Time of first page:  1808  MD notified (2nd page):  Time of second page:  Responding MD:  Dr. Estell Harpin  Time MD responded:  [phone]

## 2017-01-31 NOTE — ED Provider Notes (Signed)
[facility]-EMERGENCY DEPT Provider Note   CSN: [id number] Arrival date & time: 01/31/17  1726     History   Chief Complaint Chief Complaint  Patient presents with  . Shortness of Breath    HPI Martin [name] is a 71 y.o. male.  Patient complains of shortness of breath. He states he's been some short of breath for 2 weeks. The last couple days he's been very short of breath   The history is provided by the patient. No language interpreter was used.  Shortness of Breath  This is a recurrent problem. The problem occurs continuously.The current episode started more than 2 days ago. The problem has not changed since onset.Pertinent negatives include no fever, no headaches, no cough, no chest pain, no abdominal pain and no rash. It is unknown what precipitated the problem. Risk factors: afib. He has tried nothing for the symptoms.    Past Medical History:  Diagnosis Date  . Acute blood loss anemia    Due to rectus sheath hematoma 03/2012  . Acute renal failure (HCC)    03/2012 (not on ACEI due to this) - renal US unremarkable  . Atrial fibrillation (HCC)   . Cardiomyopathy (HCC)    Transient cardiomyopathy with EF 30-35% 03/27/12, improved to 55-60% 04/16/12  . CHF (congestive heart failure) (HCC)   . Essential hypertension, benign   . Hematuria 03/2012  . Hyperglycemia 03/2012  . Pleural effusion 03/2012  . Rectus sheath hematoma    Spontaneous during 03/2012 hospitalization managed conservatively with blood products  . Respiratory failure (HCC)    VDRF 03/2012 in setting of CHF  . Shock (HCC)    03/2012 hospitalization - Cardiogenic shock requiring pressors initially, followed by hemorrhagic shock later in hospitalization  . Unspecified sleep apnea    Suspected during 03/2012 hospitalization     Patient Active Problem List   Diagnosis Date Noted  . NSTEMI (non-ST elevated myocardial infarction) (HCC) 05/18/2016  . Hyperkalemia 05/18/2016  . Thrombocytopenia (HCC) 05/18/2016  . Acute on  chronic combined systolic and diastolic heart failure (HCC)   . Acute on chronic renal failure (HCC)   . Atrial fibrillation with RVR (HCC) 05/17/2016  . Acute on chronic systolic CHF (congestive heart failure) (HCC) 05/17/2016  . Pleural effusion, right 05/17/2016  . CKD (chronic kidney disease) stage 3, GFR 30-59 ml/min 04/02/2015  . Atrial fibrillation, unspecified 03/17/2015  . Chronic systolic CHF (congestive heart failure) (HCC) 03/10/2015  . Cardiogenic shock (HCC)   . Acute systolic HF (heart failure) (HCC)   . Acute kidney injury (HCC)   . Atrial flutter, unspecified   . CHF exacerbation (HCC) 03/01/2015  . Secondary cardiomyopathy (HCC) 05/24/2012  . CKD (chronic kidney disease), stage III 05/24/2012  . Rectus sheath hematoma 04/06/2012  . Atrial fibrillation (HCC) 03/27/2012    Past Surgical History:  Procedure Laterality Date  . CARDIOVERSION N/A 03/05/2015   Procedure: CARDIOVERSION;  Surgeon: Dolores Patty, MD;  Location: [facility] ENDOSCOPY;  Service: Cardiovascular;  Laterality: N/A;  . CARDIOVERSION N/A 05/20/2016   Procedure: CARDIOVERSION;  Surgeon: Dolores Patty, MD;  Location: [facility] OR;  Service: Cardiovascular;  Laterality: N/A;  . CARDIOVERSION N/A 05/25/2016   Procedure: CARDIOVERSION;  Surgeon: Vesta Mixer, MD;  Location: [facility] ENDOSCOPY;  Service: Cardiovascular;  Laterality: N/A;  . TEE WITHOUT CARDIOVERSION N/A 03/05/2015   Procedure: TRANSESOPHAGEAL ECHOCARDIOGRAM (TEE);  Surgeon: Dolores Patty, MD;  Location: [facility] ENDOSCOPY;  Service: Cardiovascular;  Laterality: N/A;  . TEE WITHOUT CARDIOVERSION N/A 05/20/2016  Procedure: TRANSESOPHAGEAL ECHOCARDIOGRAM (TEE);  Surgeon: Dolores Patty, MD;  Location: [facility] OR;  Service: Cardiovascular;  Laterality: N/A;       Home Medications    Prior to Admission medications   Medication Sig Start Date End Date Taking? Authorizing Provider  atorvastatin (LIPITOR) 80 MG tablet Take 1 tablet (80 mg total) by mouth  daily at 6 PM. 05/27/16  Yes Amy D Clegg, NP  furosemide (LASIX) 40 MG tablet Take 1 tablet (40 mg total) by mouth daily. Take an extra 40mg  (1 tablet) for weight 190lbs or greater 06/06/16  Yes Dolores Patty, MD  Multiple Vitamin (MULTIVITAMIN WITH MINERALS) TABS tablet Take 1 tablet by mouth daily.   Yes Historical Provider, MD  carvedilol (COREG) 3.125 MG tablet Take 1 tablet (3.125 mg total) by mouth 2 (two) times daily. Patient not taking: Reported on 01/31/2017 09/14/16 12/13/16  Dolores Patty, MD    Family History No family history on file.  Social History Social History  Substance Use Topics  . Smoking status: Never Smoker  . Smokeless tobacco: Never Used  . Alcohol use Yes     Comment: former     Allergies   Amiodarone   Review of Systems Review of Systems  Constitutional: Negative for appetite change, fatigue and fever.  HENT: Negative for congestion, ear discharge and sinus pressure.   Eyes: Negative for discharge.  Respiratory: Positive for shortness of breath. Negative for cough.   Cardiovascular: Negative for chest pain.  Gastrointestinal: Negative for abdominal pain and diarrhea.  Genitourinary: Negative for frequency and hematuria.  Musculoskeletal: Negative for back pain.  Skin: Negative for rash.  Neurological: Negative for seizures and headaches.  Psychiatric/Behavioral: Negative for hallucinations.     Physical Exam Updated Vital Signs BP 106/80   Pulse (!) 25 Comment: not picking up correctly due to irregular HR  Temp 98.2 F (36.8 C) (Oral)   Resp 20   Ht 5\' 5"  (1.651 m)   Wt 182 lb (82.6 kg)   SpO2 95%   BMI 30.29 kg/m   Physical Exam  Constitutional: He is oriented to person, place, and time. He appears well-developed.  HENT:  Head: Normocephalic.  Eyes: Conjunctivae and EOM are normal. No scleral icterus.  Neck: Neck supple. No thyromegaly present.  Cardiovascular: Exam reveals no gallop and no friction rub.   No murmur  heard. Rapid irregular heartbeat  Pulmonary/Chest: No stridor. He has no wheezes. He has no rales. He exhibits no tenderness.  Abdominal: He exhibits no distension. There is no tenderness. There is no rebound.  Musculoskeletal: Normal range of motion. He exhibits no edema.  Lymphadenopathy:    He has no cervical adenopathy.  Neurological: He is oriented to person, place, and time. He exhibits normal muscle tone. Coordination normal.  Skin: No rash noted. No erythema.  Psychiatric: He has a normal mood and affect. His behavior is normal.     ED Treatments / Results  Labs (all labs ordered are listed, but only abnormal results are displayed) Labs Reviewed  BASIC METABOLIC PANEL - Abnormal; Notable for the following:       Result Value   Glucose, Bld 154 (*)    BUN 42 (*)    Creatinine, Ser 1.98 (*)    GFR calc non Af Amer 33 (*)    GFR calc Af Amer 38 (*)    All other components within normal limits  I-STAT TROPOININ, ED - Abnormal; Notable for the following:    Troponin  i, poc 0.13 (*)    All other components within normal limits  I-STAT CHEM 8, ED - Abnormal; Notable for the following:    Potassium 7.1 (*)    BUN 62 (*)    Creatinine, Ser 2.10 (*)    Glucose, Bld 204 (*)    Calcium, Ion 1.05 (*)    Hemoglobin 18.0 (*)    HCT 53.0 (*)    All other components within normal limits  CBC  HEPATIC FUNCTION PANEL  BRAIN NATRIURETIC PEPTIDE    EKG  EKG Interpretation  Date/Time:  Tuesday January 31 2017 17:44:22 EST Ventricular Rate:  173 PR Interval:    QRS Duration: 80 QT Interval:  278 QTC Calculation: 472 R Axis:   39 Text Interpretation:  Atrial fibrillation with rapid V-rate Nonspecific T abnormalities, lateral leads Confirmed by Eddis Pingleton  MD, Hailie Searight [id number]) on 01/31/2017 7:13:28 PM       Radiology Dg Chest Portable 1 View  Result Date: 01/31/2017 CLINICAL DATA:  Shortness of breath for several weeks EXAM: PORTABLE CHEST 1 VIEW COMPARISON:  05/18/2016 FINDINGS:  Cardiac shadow is enlarged. The lungs are well aerated bilaterally. There is improved aeration in the right lung base when compared with the prior exam. No acute bony abnormality is seen. IMPRESSION: No active disease. Electronically Signed   By: Alcide Clever M.D.   On: 01/31/2017 18:51    Procedures Procedures (including critical care time)  Medications Ordered in ED Medications  diltiazem (CARDIZEM) injection 5 mg (5 mg Intravenous Not Given 01/31/17 1850)  0.9 %  sodium chloride infusion ( Intravenous Restarted 01/31/17 1925)  diltiazem (CARDIZEM) injection 10 mg (10 mg Intravenous Given 01/31/17 1816)  diltiazem (CARDIZEM) 100 mg in dextrose 5% (1 mg/mL) infusion (10 mg/hr Intravenous Rate/Dose Change 01/31/17 1923)  diltiazem (CARDIZEM) injection 10 mg (10 mg Intravenous Given 01/31/17 1845)  sodium chloride 0.9 % bolus 250 mL (0 mLs Intravenous Stopped 01/31/17 1920)  metoprolol (LOPRESSOR) injection 5 mg (5 mg Intravenous Given 01/31/17 1953)     Initial Impression / Assessment and Plan / ED Course  I have reviewed the triage vital signs and the nursing notes.  Pertinent labs & imaging results that were available during my care of the patient were reviewed by me and considered in my medical decision making (see chart for details).     CRITICAL CARE Performed by: Simon Llamas L Total critical care time: 40 minutes Critical care time was exclusive of separately billable procedures and treating other patients. Critical care was necessary to treat or prevent imminent or life-threatening deterioration. Critical care was time spent personally by me on the following activities: development of treatment plan with patient and/or surrogate as well as nursing, discussions with consultants, evaluation of patient's response to treatment, examination of patient, obtaining history from patient or surrogate, ordering and performing treatments and interventions, ordering and review of laboratory  studies, ordering and review of radiographic studies, pulse oximetry and re-evaluation of patient's condition.  Final Clinical Impressions(s) / ED Diagnoses   Final diagnoses:  Atrial fibrillation with RVR (HCC)   Patient with rapid irregular heartbeat and atrial fib.  Patient given 20 mg of Cardizem IV and also put on a drip of 10 mg a cardiogram IV in our along with Lopressor 5 mg IV. Patient still was in atrial fibrillation at a rate about 120. He'll be admitted to stepdown on a Cardizem drip New Prescriptions New Prescriptions   No medications on file     Bethann Berkshire,  MD 01/31/17 2029

## 2017-01-31 NOTE — H&P (Signed)
History and Physical    Martin [name] ZOX:[id number] DOB: [birth year] DOA: 01/31/2017  PCP: No PCP Per Patient  Patient coming from: Home.    Chief Complaint:   Feeling weak and malaise for 4 days.   HPI: Martin [name] is an 71 y.o. male with hx of refractory afib, previously on Coumadin, stopped due to large rectus sheath hematoma, subsequently on Eliquis, stopped due to epistaxis, hx of cardiogenic shock with EF 30 percent, but with medical treatment, EF recovered to normal range, previously failed TEE/DC CV, subsequently IV amiodarone was given, switch to oral, and was finally sucessfully cardioverted.  He also has CKD, and his Cr has been in the 2.0.  He came to the ER in afib with RVR at 160, and felt ill for several days, but able to tolerate this tachycardia without significant symptoms, at least no CP/SOB.  He was given IV cardiazem boluses and IV lopressor, and maintained drip at 10mg  per hour.  When I saw him, though doing well, his SBP was 85, and his drip was decreased to 5mg 'hr. Work up included Cr of 1.98 with normal K, and no leukocytosis, with normal Hb.  EKG showed afib with RVR.  Troponin was elevated at 0.13, and his CXR was clear.  Hospitalist was asked to admit him for afib with RVR.   ED Course:  See above.  Rewiew of Systems:  Constitutional: Negative for malaise, fever and chills. No significant weight loss or weight gain Eyes: Negative for eye pain, redness and discharge, diplopia, visual changes, or flashes of light. ENMT: Negative for ear pain, hoarseness, nasal congestion, sinus pressure and sore throat. No headaches; tinnitus, drooling, or problem swallowing. Cardiovascular: Negative for chest pain, palpitations, diaphoresis, dyspnea and peripheral edema. ; No orthopnea, PND Respiratory: Negative for cough, hemoptysis, wheezing and stridor. No pleuritic chestpain. Gastrointestinal: Negative for diarrhea, constipation,  melena, blood in stool, hematemesis, jaundice and  rectal bleeding.    Genitourinary: Negative for frequency, dysuria, incontinence,flank pain and hematuria; Musculoskeletal: Negative for back pain and neck pain. Negative for swelling and trauma.;  Skin: . Negative for pruritus, rash, abrasions, bruising and skin lesion.; ulcerations Neuro: Negative for headache, lightheadedness and neck stiffness. Negative for  altered level of consciousness , altered mental status, extremity weakness, burning feet, involuntary movement, seizure and syncope.  Psych: negative for anxiety, depression, insomnia, tearfulness, panic attacks, hallucinations, paranoia, suicidal or homicidal ideation    Past Medical History:  Diagnosis Date  . Acute blood loss anemia    Due to rectus sheath hematoma 03/2012  . Acute renal failure (HCC)    03/2012 (not on ACEI due to this) - renal US unremarkable  . Atrial fibrillation (HCC)   . Cardiomyopathy (HCC)    Transient cardiomyopathy with EF 30-35% 03/27/12, improved to 55-60% 04/16/12  . CHF (congestive heart failure) (HCC)   . Essential hypertension, benign   . Hematuria 03/2012  . Hyperglycemia 03/2012  . Pleural effusion 03/2012  . Rectus sheath hematoma    Spontaneous during 03/2012 hospitalization managed conservatively with blood products  . Respiratory failure (HCC)    VDRF 03/2012 in setting of CHF  . Shock (HCC)    03/2012 hospitalization - Cardiogenic shock requiring pressors initially, followed by hemorrhagic shock later in hospitalization  . Unspecified sleep apnea    Suspected during 03/2012 hospitalization      Past Surgical History:  Procedure Laterality Date  . CARDIOVERSION N/A 03/05/2015   Procedure: CARDIOVERSION;  Surgeon: Dolores Patty, MD;  Location:  [facility] ENDOSCOPY;  Service: Cardiovascular;  Laterality: N/A;  . CARDIOVERSION N/A 05/20/2016   Procedure: CARDIOVERSION;  Surgeon: Dolores Patty, MD;  Location: [facility] OR;  Service: Cardiovascular;  Laterality: N/A;  . CARDIOVERSION N/A 05/25/2016    Procedure: CARDIOVERSION;  Surgeon: Vesta Mixer, MD;  Location: [facility] ENDOSCOPY;  Service: Cardiovascular;  Laterality: N/A;  . TEE WITHOUT CARDIOVERSION N/A 03/05/2015   Procedure: TRANSESOPHAGEAL ECHOCARDIOGRAM (TEE);  Surgeon: Dolores Patty, MD;  Location: [facility] ENDOSCOPY;  Service: Cardiovascular;  Laterality: N/A;  . TEE WITHOUT CARDIOVERSION N/A 05/20/2016   Procedure: TRANSESOPHAGEAL ECHOCARDIOGRAM (TEE);  Surgeon: Dolores Patty, MD;  Location: [facility] OR;  Service: Cardiovascular;  Laterality: N/A;     reports that he has never smoked. He has never used smokeless tobacco. He reports that he drinks alcohol. He reports that he does not use drugs.  Allergies  Allergen Reactions  . Amiodarone Rash    Rash resolved when amio stopped 2013, retried in 2017 with same reaction    No family history on file.   Prior to Admission medications   Medication Sig Start Date End Date Taking? Authorizing Provider  atorvastatin (LIPITOR) 80 MG tablet Take 1 tablet (80 mg total) by mouth daily at 6 PM. 05/27/16  Yes Amy D Clegg, NP  furosemide (LASIX) 40 MG tablet Take 1 tablet (40 mg total) by mouth daily. Take an extra 40mg  (1 tablet) for weight 190lbs or greater 06/06/16  Yes Dolores Patty, MD  Multiple Vitamin (MULTIVITAMIN WITH MINERALS) TABS tablet Take 1 tablet by mouth daily.   Yes Historical Provider, MD  carvedilol (COREG) 3.125 MG tablet Take 1 tablet (3.125 mg total) by mouth 2 (two) times daily. Patient not taking: Reported on 01/31/2017 09/14/16 12/13/16  Dolores Patty, MD    Physical Exam: Vitals:   01/31/17 1958 01/31/17 2004 01/31/17 2026 01/31/17 2050  BP:  106/80  100/85  Pulse:  88 (!) 25   Resp: 19 19 20 20   Temp:      TempSrc:      SpO2:  95% 95% 100%  Weight:      Height:          Constitutional: NAD, calm, comfortable Vitals:   01/31/17 1958 01/31/17 2004 01/31/17 2026 01/31/17 2050  BP:  106/80  100/85  Pulse:  88 (!) 25   Resp: 19 19 20 20   Temp:        TempSrc:      SpO2:  95% 95% 100%  Weight:      Height:       Eyes: PERRL, lids and conjunctivae normal ENMT: Mucous membranes are moist. Posterior pharynx clear of any exudate or lesions.Normal dentition.  Neck: normal, supple, no masses, no thyromegaly Respiratory: clear to auscultation bilaterally, no wheezing, no crackles. Normal respiratory effort. No accessory muscle use.  Cardiovascular: Regular rate and rhythm, no murmurs / rubs / gallops. No extremity edema. 2+ pedal pulses. No carotid bruits.  Abdomen: no tenderness, no masses palpated. No hepatosplenomegaly. Bowel sounds positive.  Musculoskeletal: no clubbing / cyanosis. No joint deformity upper and lower extremities. Good ROM, no contractures. Normal muscle tone.  Skin: no rashes, lesions, ulcers. No induration Neurologic: CN 2-12 grossly intact. Sensation intact, DTR normal. Strength 5/5 in all 4.  Psychiatric: Normal judgment and insight. Alert and oriented x 3. Normal mood.     Labs on Admission: I have personally reviewed following labs and imaging studies  CBC:  Recent Labs Lab 01/31/17 1854 01/31/17  1900  WBC  --  7.9  HGB 18.0* 16.2  HCT 53.0* 48.8  MCV  --  94.8  PLT  --  153   Basic Metabolic Panel:  Recent Labs Lab 01/31/17 1854 01/31/17 1900  NA 137 140  K 7.1* 4.3  CL 102 103  CO2  --  26  GLUCOSE 204* 154*  BUN 62* 42*  CREATININE 2.10* 1.98*  CALCIUM  --  9.2   GFR: Estimated Creatinine Clearance: 35.3 mL/min (by C-G formula based on SCr of 1.98 mg/dL (H)). Liver Function Tests:  Recent Labs Lab 01/31/17 1900  AST 78*  ALT 122*  ALKPHOS 85  BILITOT 1.8*  PROT 6.7  ALBUMIN 3.4*   Urine analysis:    Component Value Date/Time   COLORURINE YELLOW 03/02/2015 1450   APPEARANCEUR CLEAR 03/02/2015 1450   LABSPEC 1.010 03/02/2015 1450   PHURINE 5.0 03/02/2015 1450   GLUCOSEU NEGATIVE 03/02/2015 1450   HGBUR LARGE (A) 03/02/2015 1450   BILIRUBINUR NEGATIVE 03/02/2015 1450    KETONESUR NEGATIVE 03/02/2015 1450   PROTEINUR 30 (A) 03/02/2015 1450   UROBILINOGEN 1.0 03/02/2015 1450   NITRITE NEGATIVE 03/02/2015 1450   LEUKOCYTESUR NEGATIVE 03/02/2015 1450   Radiological Exams on Admission: Dg Chest Portable 1 View  Result Date: 01/31/2017 CLINICAL DATA:  Shortness of breath for several weeks EXAM: PORTABLE CHEST 1 VIEW COMPARISON:  05/18/2016 FINDINGS: Cardiac shadow is enlarged. The lungs are well aerated bilaterally. There is improved aeration in the right lung base when compared with the prior exam. No acute bony abnormality is seen. IMPRESSION: No active disease. Electronically Signed   By: Alcide Clever M.D.   On: 01/31/2017 18:51    EKG: Independently reviewed.  Assessment/Plan Active Problems:   Atrial fibrillation with RVR (HCC)   Acute on chronic combined systolic and diastolic heart failure (HCC)   Acute on chronic renal failure (HCC)   A-fib (HCC)    PLAN:   Afib with RVR:  Admit to ICU tonight.  Will continue with IV cardiazem low dose tonight if BP tolerates.  If not, will d/c IV cardiazem.  Will use Digitalis required.  Will consult cardiology.  He may be a candidate for the Watchman's device.  Give ASA at 81mg  per day.  CHF:  He had recovered from prior low EF.  Will repeat ECHO to see where he is at this point.   Keep on oral Lasix for now.   Hypotension:  He is asymptomatic with SPB in the mid 80's.  Will taper Cardiazem drip as required.  His HR is now controlled.   DVT prophylaxis: sub Q heparin.  Code Status: FULL CODE.  Family Communication: wife at bedside.  Disposition Plan: to home when appropriate.  Consults called: None. Admission status: Inpatient.    Peytyn Trine MD FACP. [facility]  If 7PM-7AM, please contact night-coverage [email or web address] Password [email or web address]  01/31/2017, 8:59 PM

## 2017-01-31 NOTE — ED Notes (Signed)
ED Provider at bedside. 

## 2017-01-31 NOTE — ED Triage Notes (Signed)
Patient complains of shortness of breath x 2 weeks. Patient states out of breath when standing a walking a few steps.

## 2017-02-01 ENCOUNTER — Encounter (HOSPITAL_COMMUNITY): Payer: Self-pay

## 2017-02-01 DIAGNOSIS — I4891 Unspecified atrial fibrillation: Secondary | ICD-10-CM

## 2017-02-01 DIAGNOSIS — I5023 Acute on chronic systolic (congestive) heart failure: Secondary | ICD-10-CM

## 2017-02-01 DIAGNOSIS — I481 Persistent atrial fibrillation: Secondary | ICD-10-CM

## 2017-02-01 DIAGNOSIS — I5043 Acute on chronic combined systolic (congestive) and diastolic (congestive) heart failure: Secondary | ICD-10-CM

## 2017-02-01 LAB — BASIC METABOLIC PANEL
Anion gap: 9 (ref 5–15)
BUN: 40 mg/dL — AB (ref 6–20)
CALCIUM: 8.7 mg/dL — AB (ref 8.9–10.3)
CO2: 28 mmol/L (ref 22–32)
CREATININE: 1.83 mg/dL — AB (ref 0.61–1.24)
Chloride: 101 mmol/L (ref 101–111)
GFR calc Af Amer: 42 mL/min — ABNORMAL LOW (ref >60)
GFR calc non Af Amer: 36 mL/min — ABNORMAL LOW (ref >60)
GLUCOSE: 113 mg/dL — AB (ref 65–99)
Potassium: 4.6 mmol/L (ref 3.5–5.1)
Sodium: 138 mmol/L (ref 135–145)

## 2017-02-01 LAB — POCT I-STAT, CHEM 8
BUN: 62 mg/dL — AB (ref 6–20)
CALCIUM ION: 1.05 mmol/L — AB (ref 1.15–1.40)
CHLORIDE: 102 mmol/L (ref 101–111)
CREATININE: 2.1 mg/dL — AB (ref 0.61–1.24)
Glucose, Bld: 204 mg/dL — ABNORMAL HIGH (ref 65–99)
HCT: 53 % — ABNORMAL HIGH (ref 39.0–52.0)
Hemoglobin: 18 g/dL — ABNORMAL HIGH (ref 13.0–17.0)
Potassium: 7.1 mmol/L (ref 3.5–5.1)
Sodium: 137 mmol/L (ref 135–145)
TCO2: 28 mmol/L (ref 0–100)

## 2017-02-01 LAB — TROPONIN I
Troponin I: 0.56 ng/mL (ref <0.03)
Troponin I: 0.54 ng/mL (ref <0.03)
Troponin I: 0.45 ng/mL (ref <0.03)

## 2017-02-01 LAB — TSH: TSH: 1.377 u[IU]/mL (ref 0.350–4.500)

## 2017-02-01 LAB — CBC
HCT: 46.3 % (ref 39.0–52.0)
Hemoglobin: 15.4 g/dL (ref 13.0–17.0)
MCH: 31.4 pg (ref 26.0–34.0)
MCHC: 33.3 g/dL (ref 30.0–36.0)
MCV: 94.5 fL (ref 78.0–100.0)
PLATELETS: 131 10*3/uL — AB (ref 150–400)
RBC: 4.9 MIL/uL (ref 4.22–5.81)
RDW: 14.9 % (ref 11.5–15.5)
WBC: 8.8 10*3/uL (ref 4.0–10.5)

## 2017-02-01 LAB — GLUCOSE, CAPILLARY: Glucose-Capillary: 119 mg/dL — ABNORMAL HIGH (ref 65–99)

## 2017-02-01 LAB — HEPARIN LEVEL (UNFRACTIONATED): Heparin Unfractionated: 0.7 IU/mL (ref 0.30–0.70)

## 2017-02-01 LAB — MRSA PCR SCREENING: MRSA BY PCR: NEGATIVE

## 2017-02-01 MED ORDER — DIGOXIN 0.25 MG/ML IJ SOLN
0.5000 mg | Freq: Once | INTRAMUSCULAR | Status: AC
  Administered 2017-02-01: 0.5 mg via INTRAVENOUS
  Filled 2017-02-01: qty 2

## 2017-02-01 MED ORDER — DILTIAZEM HCL-DEXTROSE 100-5 MG/100ML-% IV SOLN (PREMIX)
5.0000 mg/h | Freq: Once | INTRAVENOUS | Status: AC
  Administered 2017-02-01: 15 mg/h via INTRAVENOUS

## 2017-02-01 MED ORDER — ASPIRIN EC 81 MG PO TBEC
81.0000 mg | DELAYED_RELEASE_TABLET | Freq: Every day | ORAL | Status: DC
  Administered 2017-02-01 – 2017-02-07 (×6): 81 mg via ORAL
  Filled 2017-02-01 (×6): qty 1

## 2017-02-01 MED ORDER — ACETAMINOPHEN 325 MG PO TABS: 650.0000 mg | ORAL_TABLET | Freq: Four times a day (QID) | ORAL | Status: DC | PRN

## 2017-02-01 MED ORDER — ONDANSETRON HCL 4 MG/2ML IJ SOLN: 4.0000 mg | Freq: Four times a day (QID) | INTRAMUSCULAR | Status: DC | PRN

## 2017-02-01 MED ORDER — HEPARIN BOLUS VIA INFUSION
4000.0000 [IU] | Freq: Once | INTRAVENOUS | Status: DC
  Filled 2017-02-01: qty 4000

## 2017-02-01 MED ORDER — DILTIAZEM HCL-DEXTROSE 100-5 MG/100ML-% IV SOLN (PREMIX)
5.0000 mg/h | Freq: Once | INTRAVENOUS | Status: AC
  Administered 2017-02-01: 15 mg/h via INTRAVENOUS
  Filled 2017-02-01: qty 100

## 2017-02-01 MED ORDER — HEPARIN SODIUM (PORCINE) 5000 UNIT/ML IJ SOLN
5000.0000 [IU] | Freq: Three times a day (TID) | INTRAMUSCULAR | Status: DC
  Administered 2017-02-01 (×2): 5000 [IU] via SUBCUTANEOUS
  Filled 2017-02-01 (×2): qty 1

## 2017-02-01 MED ORDER — HEPARIN (PORCINE) IN NACL 100-0.45 UNIT/ML-% IJ SOLN
1000.0000 [IU]/h | INTRAMUSCULAR | Status: DC
  Administered 2017-02-01 – 2017-02-02 (×2): 1200 [IU]/h via INTRAVENOUS
  Administered 2017-02-03 – 2017-02-05 (×3): 1000 [IU]/h via INTRAVENOUS
  Filled 2017-02-01 (×5): qty 250

## 2017-02-01 MED ORDER — PNEUMOCOCCAL VAC POLYVALENT 25 MCG/0.5ML IJ INJ
0.5000 mL | INJECTION | INTRAMUSCULAR | Status: DC
  Filled 2017-02-01: qty 0.5

## 2017-02-01 MED ORDER — AMIODARONE HCL IN DEXTROSE 360-4.14 MG/200ML-% IV SOLN
30.0000 mg/h | INTRAVENOUS | Status: DC
  Administered 2017-02-01 – 2017-02-06 (×10): 30 mg/h via INTRAVENOUS
  Filled 2017-02-01 (×11): qty 200

## 2017-02-01 MED ORDER — DIGOXIN 0.25 MG/ML IJ SOLN
0.2500 mg | Freq: Four times a day (QID) | INTRAMUSCULAR | Status: AC
  Administered 2017-02-01 (×2): 0.25 mg via INTRAVENOUS
  Filled 2017-02-01 (×2): qty 2

## 2017-02-01 MED ORDER — FUROSEMIDE 40 MG PO TABS
40.0000 mg | ORAL_TABLET | Freq: Every day | ORAL | Status: DC
  Administered 2017-02-01: 40 mg via ORAL
  Filled 2017-02-01: qty 1

## 2017-02-01 MED ORDER — HEPARIN BOLUS VIA INFUSION
2000.0000 [IU] | Freq: Once | INTRAVENOUS | Status: AC
  Administered 2017-02-01: 2000 [IU] via INTRAVENOUS
  Filled 2017-02-01: qty 2000

## 2017-02-01 MED ORDER — ATORVASTATIN CALCIUM 80 MG PO TABS
80.0000 mg | ORAL_TABLET | Freq: Every day | ORAL | Status: DC
  Administered 2017-02-02 – 2017-02-06 (×5): 80 mg via ORAL
  Filled 2017-02-01 (×5): qty 1

## 2017-02-01 MED ORDER — ACETAMINOPHEN 650 MG RE SUPP: 650.0000 mg | Freq: Four times a day (QID) | RECTAL | Status: DC | PRN

## 2017-02-01 MED ORDER — ONDANSETRON HCL 4 MG PO TABS: 4.0000 mg | ORAL_TABLET | Freq: Four times a day (QID) | ORAL | Status: DC | PRN

## 2017-02-01 MED ORDER — AMIODARONE HCL IN DEXTROSE 360-4.14 MG/200ML-% IV SOLN
60.0000 mg/h | INTRAVENOUS | Status: AC
  Administered 2017-02-01: 60 mg/h via INTRAVENOUS
  Filled 2017-02-01 (×2): qty 200

## 2017-02-01 NOTE — Progress Notes (Signed)
ANTICOAGULATION CONSULT NOTE - Initial Consult  Pharmacy Consult for HEPARIN Indication: atrial fibrillation  Allergies  Allergen Reactions  . Amiodarone Rash    Rash resolved when amio stopped 2013, retried in 2017 with same reaction   Patient Measurements: Height: 5\' 5"  (165.1 cm) Weight: 195 lb 1.7 oz (88.5 kg) IBW/kg (Calculated) : 61.5 HEPARIN DW (KG): 78.6  Vital Signs: Temp: 97.8 F (36.6 C) (02/07 1200) Temp Source: Oral (02/07 1200) BP: 124/95 (02/07 1200) Pulse Rate: 111 (02/07 1200)  Labs:  Recent Labs  01/31/17 1854 01/31/17 1900 02/01/17 0108 02/01/17 0629 02/01/17 1228  HGB 18.0*  18.0* 16.2  --  15.4  --   HCT 53.0*  53.0* 48.8  --  46.3  --   PLT  --  153  --  131*  --   CREATININE 2.10*  2.10* 1.98*  --  1.83*  --   TROPONINI  --   --  0.56* 0.54* 0.45*   Estimated Creatinine Clearance: 39.5 mL/min (by C-G formula based on SCr of 1.83 mg/dL (H)).  Medical History: Past Medical History:  Diagnosis Date  . Acute blood loss anemia    Due to rectus sheath hematoma 03/2012  . Acute renal failure (HCC)    03/2012 (not on ACEI due to this) - renal US unremarkable  . Atrial fibrillation (HCC)   . Cardiomyopathy (HCC)    Transient cardiomyopathy with EF 30-35% 03/27/12, improved to 55-60% 04/16/12  . CHF (congestive heart failure) (HCC)   . Essential hypertension, benign   . Hematuria 03/2012  . Hyperglycemia 03/2012  . Pleural effusion 03/2012  . Rectus sheath hematoma    Spontaneous during 03/2012 hospitalization managed conservatively with blood products  . Respiratory failure (HCC)    VDRF 03/2012 in setting of CHF  . Shock (HCC)    03/2012 hospitalization - Cardiogenic shock requiring pressors initially, followed by hemorrhagic shock later in hospitalization  . Unspecified sleep apnea    Suspected during 03/2012 hospitalization    Medications:  Prescriptions Prior to Admission  Medication Sig Dispense Refill Last Dose  . atorvastatin (LIPITOR) 80  MG tablet Take 1 tablet (80 mg total) by mouth daily at 6 PM. 30 tablet 6 01/30/2017 at Unknown time  . furosemide (LASIX) 40 MG tablet Take 1 tablet (40 mg total) by mouth daily. Take an extra 40mg  (1 tablet) for weight 190lbs or greater 15 tablet 6 01/31/2017 at Unknown time  . Multiple Vitamin (MULTIVITAMIN WITH MINERALS) TABS tablet Take 1 tablet by mouth daily.   01/30/2017 at Unknown time  . carvedilol (COREG) 3.125 MG tablet Take 1 tablet (3.125 mg total) by mouth 2 (two) times daily. (Patient not taking: Reported on 01/31/2017) 60 tablet 3 Not Taking at Unknown time   Assessment: 71yo male admitted with afib.  Asked to initiate heparin infusion.  No anticoagulants listed on PTA med list.  Pt did receive SQ heparin 5000 units today for VTE prophylaxis.    Goal of Therapy:  Heparin level 0.3-0.7 units/ml Monitor platelets by anticoagulation protocol: Yes   Plan:  Heparin 2000 units bolus now x 1 (lower dose due to SQ heparin given) Heparin infusion at 1200 units/hr Check heparin level in 6-8 hours then daily CBC daily while on Heparin  Margo Aye, Lua Feng A 02/01/2017,2:10 PM

## 2017-02-01 NOTE — Progress Notes (Addendum)
Patient HR went back up to 140 , BP is soft at 100 systolic. Given his troponin leak now at 0.5, will slow his HR down with Digitalis. Give O.5mg  IV x 1, then 25 mcg Q 6 hours x 2 for full Load.  He is allergic to Amiodarone.  He has no CP and remained clinically stable. He has hx of CHF, so will be cautious with IVF.  Houston Siren MD FACP. Hospitalist.

## 2017-02-01 NOTE — Progress Notes (Signed)
PROGRESS NOTE    Martin [name]  ZOX:[id number] DOB: 10/22/1946 DOA: 01/31/2017 PCP: No PCP Per Patient    Brief Narrative:  Martin [name] is an 71 y.o. male with hx of refractory afib, previously on Coumadin, stopped due to large rectus sheath hematoma, subsequently on Eliquis, stopped due to epistaxis, hx of cardiogenic shock with EF 30 percent, but with medical treatment, EF recovered to normal range, previously failed TEE/DC CV, subsequently IV amiodarone was given, switch to oral, and was finally sucessfully cardioverted.  He also has CKD, and his Cr has been in the 2.0.  He came to the ER in afib with RVR at 160, and felt ill for several days, but able to tolerate this tachycardia without significant symptoms, at least no CP/SOB.  He was given IV cardiazem boluses and IV lopressor, and maintained drip at 10mg  per hour.   Hospitalist was asked to admit him for afib with RVR.   Assessment & Plan:   Active Problems:   Atrial fibrillation with RVR (HCC)   Acute on chronic combined systolic and diastolic heart failure (HCC)   Acute on chronic renal failure (HCC)   A-fib (HCC)   1. Atrial fibrillation with RVR. Has been intolerant to amiodarone in the past. Started on diltiazem infusion as well as digoxin. Heart rate remains uncontrolled. Cardiology following. Not on anticoagulation due to history of rectus sheath hematoma/epistaxis. He has refused to restart any further anticoagulation.  2. Acute on chronic combined diastolic congestive heart failure. Dry weight is approximately 182 pounds. Weight on admission is 195 pounds. Currently on oral Lasix. Blood pressure is low making diuresis challenging. Cardiology following.  3. CKD stage III. Creatinine is currently 1.8. Continue to monitor  4. Coronary artery disease. Previously had MI approximately one year ago with troponin peak of 37. Lexi scan with concern for ischemia. He did refuse LHC or statin at that time.   DVT prophylaxis:  heparin Code Status: full Family Communication: discussed with patient Disposition Plan: pending hospital course   Consultants:   cardiology  Procedures:     Antimicrobials:      Subjective: Shortness of breath is better. No chest pain  Objective: Vitals:   02/01/17 0400 02/01/17 0500 02/01/17 0600 02/01/17 0800  BP: 106/90 (!) 115/103 114/87 110/88  Pulse: 99 98 (!) 115 (!) 103  Resp: 18 (!) 22 (!) 24 19  Temp: 97.4 F (36.3 C)     TempSrc: Oral     SpO2: 100% 100% 98% 96%  Weight:  88.5 kg (195 lb 1.7 oz)    Height:        Intake/Output Summary (Last 24 hours) at 02/01/17 1114 Last data filed at 02/01/17 1000  Gross per 24 hour  Intake          1468.69 ml  Output                0 ml  Net          1468.69 ml   Filed Weights   01/31/17 1734 02/01/17 0500  Weight: 82.6 kg (182 lb) 88.5 kg (195 lb 1.7 oz)    Examination:  General exam: Appears calm and comfortable  Respiratory system: Clear to auscultation. Respiratory effort normal. Cardiovascular system: S1 & S2 heard, irregular. No JVD, murmurs, rubs, gallops or clicks. 1-2+ pedal edema. Gastrointestinal system: Abdomen is nondistended, soft and nontender. No organomegaly or masses felt. Normal bowel sounds heard. Central nervous system: Alert and oriented. No focal neurological deficits. Extremities:  Symmetric 5 x 5 power. Skin: No rashes, lesions or ulcers Psychiatry: Judgement and insight appear normal. Mood & affect appropriate.     Data Reviewed: I have personally reviewed following labs and imaging studies  CBC:  Recent Labs Lab 01/31/17 1854 01/31/17 1900 02/01/17 0629  WBC  --  7.9 8.8  HGB 18.0*  18.0* 16.2 15.4  HCT 53.0*  53.0* 48.8 46.3  MCV  --  94.8 94.5  PLT  --  153 131*   Basic Metabolic Panel:  Recent Labs Lab 01/31/17 1854 01/31/17 1900 02/01/17 0629  NA 137  137 140 138  K 7.1*  7.1* 4.3 4.6  CL 102  102 103 101  CO2  --  26 28  GLUCOSE 204*  204* 154*  113*  BUN 62*  62* 42* 40*  CREATININE 2.10*  2.10* 1.98* 1.83*  CALCIUM  --  9.2 8.7*   GFR: Estimated Creatinine Clearance: 39.5 mL/min (by C-G formula based on SCr of 1.83 mg/dL (H)). Liver Function Tests:  Recent Labs Lab 01/31/17 1900  AST 78*  ALT 122*  ALKPHOS 85  BILITOT 1.8*  PROT 6.7  ALBUMIN 3.4*   No results for input(s): LIPASE, AMYLASE in the last 168 hours. No results for input(s): AMMONIA in the last 168 hours. Coagulation Profile: No results for input(s): INR, PROTIME in the last 168 hours. Cardiac Enzymes:  Recent Labs Lab 02/01/17 0108 02/01/17 0629  TROPONINI 0.56* 0.54*   BNP (last 3 results) No results for input(s): PROBNP in the last 8760 hours. HbA1C: No results for input(s): HGBA1C in the last 72 hours. CBG: No results for input(s): GLUCAP in the last 168 hours. Lipid Profile: No results for input(s): CHOL, HDL, LDLCALC, TRIG, CHOLHDL, LDLDIRECT in the last 72 hours. Thyroid Function Tests:  Recent Labs  02/01/17 0108  TSH 1.377   Anemia Panel: No results for input(s): VITAMINB12, FOLATE, FERRITIN, TIBC, IRON, RETICCTPCT in the last 72 hours. Sepsis Labs: No results for input(s): PROCALCITON, LATICACIDVEN in the last 168 hours.  Recent Results (from the past 240 hour(s))  MRSA PCR Screening     Status: None   Collection Time: 02/01/17 12:46 AM  Result Value Ref Range Status   MRSA by PCR NEGATIVE NEGATIVE Final    Comment:        The GeneXpert MRSA Assay (FDA approved for NASAL specimens only), is one component of a comprehensive MRSA colonization surveillance program. It is not intended to diagnose MRSA infection nor to guide or monitor treatment for MRSA infections.          Radiology Studies: Dg Chest Portable 1 View  Result Date: 01/31/2017 CLINICAL DATA:  Shortness of breath for several weeks EXAM: PORTABLE CHEST 1 VIEW COMPARISON:  05/18/2016 FINDINGS: Cardiac shadow is enlarged. The lungs are well aerated  bilaterally. There is improved aeration in the right lung base when compared with the prior exam. No acute bony abnormality is seen. IMPRESSION: No active disease. Electronically Signed   By: Alcide Clever M.D.   On: 01/31/2017 18:51        Scheduled Meds: . sodium chloride   Intravenous Once  . aspirin EC  81 mg Oral Daily  . atorvastatin  80 mg Oral q1800  . digoxin  0.25 mg Intravenous Q6H  . diltiazem  5 mg Intravenous Once  . furosemide  40 mg Oral Daily  . heparin  5,000 Units Subcutaneous Q8H  . [START ON 02/02/2017] pneumococcal 23 valent vaccine  0.5  mL Intramuscular Tomorrow-1000   Continuous Infusions:   LOS: 1 day    Time spent:    Ronak Duquette, MD [facility] Pager [phone]  If 7PM-7AM, please contact night-coverage [email or web address] Password [email or web address] 02/01/2017, 11:14 AM

## 2017-02-01 NOTE — Consult Note (Signed)
Cardiology Consultation   Patient ID: Martin [name]; [id number]; 1946-10-30   Admit date: 01/31/2017 Date of Consult: 02/01/2017  Referring MD: Dr. Kerry Hough Cardiologist:Dr. Bensimhon Consulting Cardiologist: Dr. Tenny Craw  Patient Care Team: No Pcp Per Patient as PCP - General (General Practice) Jonelle Sidle, MD (Cardiology)    Reason for Consultation: afib rvr and chf   History of Present Illness: Martin [name] is a 71 y.o. male with a hx of with history of prior cardiomyopathy presents for followup after admission for atypical atrial flutter with RVR and acute systolic CHF.  Patient was admitted in 4/13 with cardiogenic shock in the setting of atrial fibrillation, EF 30-35% by echo at that time.  With medical treatment, he improved, and later in 4/13, EF was up to normal range.  He was started on coumadin for atrial fibrillation but developed a rectus sheath hematoma (large) and anticoagulation was stopped. He stopped Eliquis secondary to nose bleeds and refuses any anticoagulation     Admitted to [facility] 05/17/16 with increased dyspnea progressively over the last few weeks. On admit creatine 1.7, K 4.4, BNP 905. Troponin 0.47 -> 25.87 -> 37.38.  Due to worsening renal function and poor response to IV diuresis he transferred to [facility] for Advanced Heart Failure Team to manage. On arrival he was SOB at rest, 3+ lower extremity edema, hypotension, A fib RVR, and cool extremities. EF 20-25% Had central access placed to assess mixed venous saturation and guide diuresis. Placed on milrinone for cardiogenic shock as well as norepi and amio drip. Diuresed with IV lasix and once euvolemic he transitioned to lasix 40 mg daily. Overall he diuresed 29 pounds. Discharge weight 182 lbs.    Failed TEE/DC-CV 05/20/16  for persistent A fib on IV amio.  s/p successful DCCV 05/25/16    ECHO 11/03/16 EF 40-45% Severe LAE  Patient says he was shoveling snow with the last snow and that's when his heart went out of  rhythm. Didn't call but had progressive worsening shortness of breath and edema. Became hypotensive last night with IV Cardizem and stopped. Digoxin given but now back on low dose IV dilt 15mg /hr.     Past Medical History:  Diagnosis Date  . Acute blood loss anemia    Due to rectus sheath hematoma 03/2012  . Acute renal failure (HCC)    03/2012 (not on ACEI due to this) - renal US unremarkable  . Atrial fibrillation (HCC)   . Cardiomyopathy (HCC)    Transient cardiomyopathy with EF 30-35% 03/27/12, improved to 55-60% 04/16/12  . CHF (congestive heart failure) (HCC)   . Essential hypertension, benign   . Hematuria 03/2012  . Hyperglycemia 03/2012  . Pleural effusion 03/2012  . Rectus sheath hematoma    Spontaneous during 03/2012 hospitalization managed conservatively with blood products  . Respiratory failure (HCC)    VDRF 03/2012 in setting of CHF  . Shock (HCC)    03/2012 hospitalization - Cardiogenic shock requiring pressors initially, followed by hemorrhagic shock later in hospitalization  . Unspecified sleep apnea    Suspected during 03/2012 hospitalization     Past Surgical History:  Procedure Laterality Date  . CARDIOVERSION N/A 03/05/2015   Procedure: CARDIOVERSION;  Surgeon: Dolores Patty, MD;  Location: [facility] ENDOSCOPY;  Service: Cardiovascular;  Laterality: N/A;  . CARDIOVERSION N/A 05/20/2016   Procedure: CARDIOVERSION;  Surgeon: Dolores Patty, MD;  Location: [facility] OR;  Service: Cardiovascular;  Laterality: N/A;  . CARDIOVERSION N/A 05/25/2016   Procedure: CARDIOVERSION;  Surgeon:  Vesta Mixer, MD;  Location: [facility] ENDOSCOPY;  Service: Cardiovascular;  Laterality: N/A;  . TEE WITHOUT CARDIOVERSION N/A 03/05/2015   Procedure: TRANSESOPHAGEAL ECHOCARDIOGRAM (TEE);  Surgeon: Dolores Patty, MD;  Location: [facility] ENDOSCOPY;  Service: Cardiovascular;  Laterality: N/A;  . TEE WITHOUT CARDIOVERSION N/A 05/20/2016   Procedure: TRANSESOPHAGEAL ECHOCARDIOGRAM (TEE);  Surgeon: Dolores Patty, MD;  Location: [facility] OR;  Service: Cardiovascular;  Laterality: N/A;      Home Meds: Prior to Admission medications   Medication Sig Start Date End Date Taking? Authorizing Provider  atorvastatin (LIPITOR) 80 MG tablet Take 1 tablet (80 mg total) by mouth daily at 6 PM. 05/27/16  Yes Amy D Clegg, NP  furosemide (LASIX) 40 MG tablet Take 1 tablet (40 mg total) by mouth daily. Take an extra 40mg  (1 tablet) for weight 190lbs or greater 06/06/16  Yes Dolores Patty, MD  Multiple Vitamin (MULTIVITAMIN WITH MINERALS) TABS tablet Take 1 tablet by mouth daily.   Yes Historical Provider, MD  carvedilol (COREG) 3.125 MG tablet Take 1 tablet (3.125 mg total) by mouth 2 (two) times daily. Patient not taking: Reported on 01/31/2017 09/14/16 12/13/16  Dolores Patty, MD    Current Medications: . sodium chloride   Intravenous Once  . aspirin EC  81 mg Oral Daily  . atorvastatin  80 mg Oral q1800  . digoxin  0.25 mg Intravenous Q6H  . diltiazem  5 mg Intravenous Once  . furosemide  40 mg Oral Daily  . heparin  5,000 Units Subcutaneous Q8H  . [START ON 02/02/2017] pneumococcal 23 valent vaccine  0.5 mL Intramuscular Tomorrow-1000     Allergies:    Allergies  Allergen Reactions  . Amiodarone Rash    Rash resolved when amio stopped 2013, retried in 2017 with same reaction    Social History:   The patient  reports that he has never smoked. He has never used smokeless tobacco. He reports that he drinks alcohol. He reports that he does not use drugs.    Family History:   The patient's family history is not on file.   ROS:  Please see the history of present illness.  Review of Systems  Constitution: Positive for weakness and malaise/fatigue.  Cardiovascular: Positive for dyspnea on exertion, irregular heartbeat, leg swelling, orthopnea, palpitations and paroxysmal nocturnal dyspnea.  Respiratory: Positive for shortness of breath and sleep disturbances due to breathing.   Musculoskeletal:  Positive for muscle weakness.   All other ROS reviewed and negative.      Vital Signs: Blood pressure 114/87, pulse (!) 115, temperature 97.4 F (36.3 C), temperature source Oral, resp. rate (!) 24, height 5\' 5"  (1.651 m), weight 195 lb 1.7 oz (88.5 kg), SpO2 98 %.   PHYSICAL EXAM: General:  Well nourished, well developed, in no acute distress  HEENT: normal except poor dentition Lymph: no adenopathy Neck: increased JVD Endocrine:  No thryomegaly Vascular: No carotid bruits; FA pulses 2+ bilaterally without bruits  Cardiac:  irreg irreg with 2/6 sys murmur LSB, norub, bruit, thrill, or heave Lungs:  Decreased breath sounds with bibasilar rales  Abd: soft, nontender, no hepatomegaly  Ext: plus 2 edema, Good distal pulses bilaterally Musculoskeletal:  No deformities, BUE and BLE strength normal and equal Skin: warm and dry  Neuro:  CNs 2-12 intact, no focal abnormalities noted Psych:  Normal affect    EKG: Atrial fibrillation with RVR, PVC's nonspecific ST changes  Telemetry: Atrial fib at 120/m  Labs:  Recent Labs  02/01/17 0108 02/01/17 0629  TROPONINI 0.56* 0.54*   Lab Results  Component Value Date   WBC 8.8 02/01/2017   HGB 15.4 02/01/2017   HCT 46.3 02/01/2017   MCV 94.5 02/01/2017   PLT 131 (L) 02/01/2017    Recent Labs Lab 01/31/17 1900 02/01/17 0629  NA 140 138  K 4.3 4.6  CL 103 101  CO2 26 28  BUN 42* 40*  CREATININE 1.98* 1.83*  CALCIUM 9.2 8.7*  PROT 6.7  --   BILITOT 1.8*  --   ALKPHOS 85  --   ALT 122*  --   AST 78*  --   GLUCOSE 154* 113*   No results found for: CHOL, HDL, LDLCALC, TRIG No results found for: DDIMER  Radiology/Studies:  Dg Chest Portable 1 View  Result Date: 01/31/2017 CLINICAL DATA:  Shortness of breath for several weeks EXAM: PORTABLE CHEST 1 VIEW COMPARISON:  05/18/2016 FINDINGS: Cardiac shadow is enlarged. The lungs are well aerated bilaterally. There is improved aeration in the right lung base when compared with  the prior exam. No acute bony abnormality is seen. IMPRESSION: No active disease. Electronically Signed   By: Alcide Clever M.D.   On: 01/31/2017 18:51     PROBLEM LIST:  Active Problems:   Atrial fibrillation with RVR (HCC)   Acute on chronic combined systolic and diastolic heart failure (HCC)   Acute on chronic renal failure (HCC)   A-fib (HCC)     ASSESSMENT AND PLAN:  Atrial fibrillation with RVR S/P DCCV 05/25/16, intolerant of amio, usually goes into shock and lov discused using another antiarrythmic or considering ablation although LA is big.He was reluctant to do much but agreed to discuss with Dr. Johney Frame. Last night became hypotensive on IV Cardizem, given Digoxin and now back on IV Cardizem. Suspect we will have to transfer to [facility] for management of CHF and afib with history of cardiogenic shock when in afib. Need EPS input for antiarrythmic. With severe LAE will be difficult to maintain NSR. Will discuss with Dr. Tenny Craw   Acute on Chronic systolic CHF: Suspect nonischemic cardiomyopathy. ECHO 06/04/2015 showed EF 55-60%  Improved in the past with termination of atrial fibrillation.  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 Echo to 40-45%.  On 40 mg po lasix daily. Needs IV but BP low. - Continue carvedilol 3.125 mg twice a day.  - Would like to add ARB or spiro but he refused further medicine titration in Nov.   Rash: Erythematous patch-like rash on trunk with pruritis. -  Resolved once he stopped amio.    CAD On last admit troponin peaked 37. No LHC with elevated creatinine but Lexiscan with concern for ischemia-->  apical septal, apical inferior and apex location.  He refuses LHC or statin. Now troponins elevated but flat. No chest pain  CKD Stage III:  - Initially renal function worsened but with IV milrinone this improved over time. Creatinine peaked at 3.2 and trended down to 2.15. Creatinine back down to 1.33. Now 1.83.   Suspect OSA:  Was set up for sleep study but he  cancelled.      Signed, Jacolyn Reedy, PA-C  02/01/2017 9:53 AM   Pt seen and examined  I agree with findings as noted above by Leda Gauze   I have also reviewed with D Bensimhon Pt is a 71 yo with chronic systolic CHF, PAF, Possible CAD, CKD stage III Admtted yesterday with increased SOB  Found to be in afib with RVR. Currently on IV  diltiazem, heart rates remain uncontrolled    On exam, pt denies palpitations  Lungs with rales at bases  Cardiac exam:  Irreg Irreg  No S3  Abd supple Ext with 1-2 + edema  Tele with afib rates 110 to 160 (on 15 mg / hour dilt) Labs signif for Cr 1.83  (baseline 1.29)     Pt has not tolerated afib will in the past  Long hosp in 2017 with cardiogenic shock.  Would recomm pt be transferred to [facility] for further care by the advanced CHF service  Wll start IV heparin    He tolerated IV amiodarone in past  Had rash on pill version  Will go ahead and start IV  Pharmacy will need to review.  Dietrich Pates

## 2017-02-02 ENCOUNTER — Inpatient Hospital Stay (HOSPITAL_COMMUNITY): Payer: Medicare Other

## 2017-02-02 DIAGNOSIS — N183 Chronic kidney disease, stage 3 (moderate): Secondary | ICD-10-CM

## 2017-02-02 DIAGNOSIS — I509 Heart failure, unspecified: Secondary | ICD-10-CM

## 2017-02-02 DIAGNOSIS — I5033 Acute on chronic diastolic (congestive) heart failure: Secondary | ICD-10-CM

## 2017-02-02 DIAGNOSIS — R002 Palpitations: Secondary | ICD-10-CM

## 2017-02-02 DIAGNOSIS — N179 Acute kidney failure, unspecified: Secondary | ICD-10-CM

## 2017-02-02 LAB — BASIC METABOLIC PANEL
Anion gap: 11 (ref 5–15)
Anion gap: 12 (ref 5–15)
Anion gap: 12 (ref 5–15)
BUN: 32 mg/dL — ABNORMAL HIGH (ref 6–20)
BUN: 29 mg/dL — AB (ref 6–20)
BUN: 28 mg/dL — AB (ref 6–20)
CALCIUM: 8.5 mg/dL — AB (ref 8.9–10.3)
CALCIUM: 8.6 mg/dL — AB (ref 8.9–10.3)
CHLORIDE: 100 mmol/L — AB (ref 101–111)
CO2: 26 mmol/L (ref 22–32)
CO2: 24 mmol/L (ref 22–32)
CO2: 28 mmol/L (ref 22–32)
CREATININE: 1.65 mg/dL — AB (ref 0.61–1.24)
CREATININE: 1.54 mg/dL — AB (ref 0.61–1.24)
CREATININE: 1.74 mg/dL — AB (ref 0.61–1.24)
Calcium: 8.6 mg/dL — ABNORMAL LOW (ref 8.9–10.3)
Chloride: 100 mmol/L — ABNORMAL LOW (ref 101–111)
Chloride: 95 mmol/L — ABNORMAL LOW (ref 101–111)
GFR calc Af Amer: 52 mL/min — ABNORMAL LOW (ref >60)
GFR calc Af Amer: 45 mL/min — ABNORMAL LOW (ref >60)
GFR, EST AFRICAN AMERICAN: 48 mL/min — AB (ref >60)
GFR, EST NON AFRICAN AMERICAN: 41 mL/min — AB (ref >60)
GFR, EST NON AFRICAN AMERICAN: 45 mL/min — AB (ref >60)
GFR, EST NON AFRICAN AMERICAN: 39 mL/min — AB (ref >60)
GLUCOSE: 111 mg/dL — AB (ref 65–99)
GLUCOSE: 121 mg/dL — AB (ref 65–99)
Glucose, Bld: 129 mg/dL — ABNORMAL HIGH (ref 65–99)
POTASSIUM: 3.8 mmol/L (ref 3.5–5.1)
POTASSIUM: 4.3 mmol/L (ref 3.5–5.1)
Potassium: 4.1 mmol/L (ref 3.5–5.1)
SODIUM: 137 mmol/L (ref 135–145)
SODIUM: 136 mmol/L (ref 135–145)
SODIUM: 135 mmol/L (ref 135–145)

## 2017-02-02 LAB — CBC
HCT: 46.6 % (ref 39.0–52.0)
Hemoglobin: 15.2 g/dL (ref 13.0–17.0)
MCH: 30.6 pg (ref 26.0–34.0)
MCHC: 32.6 g/dL (ref 30.0–36.0)
MCV: 93.8 fL (ref 78.0–100.0)
PLATELETS: 129 10*3/uL — AB (ref 150–400)
RBC: 4.97 MIL/uL (ref 4.22–5.81)
RDW: 14.6 % (ref 11.5–15.5)
WBC: 8.4 10*3/uL (ref 4.0–10.5)

## 2017-02-02 LAB — TROPONIN I
Troponin I: 0.33 ng/mL (ref <0.03)
Troponin I: 0.3 ng/mL (ref <0.03)
Troponin I: 0.26 ng/mL (ref <0.03)

## 2017-02-02 LAB — TSH: TSH: 1.092 u[IU]/mL (ref 0.350–4.500)

## 2017-02-02 LAB — BRAIN NATRIURETIC PEPTIDE: B NATRIURETIC PEPTIDE 5: 820.8 pg/mL — AB (ref 0.0–100.0)

## 2017-02-02 LAB — HEPARIN LEVEL (UNFRACTIONATED): Heparin Unfractionated: 0.46 IU/mL (ref 0.30–0.70)

## 2017-02-02 LAB — PROTIME-INR
INR: 1.21
PROTHROMBIN TIME: 15.4 s — AB (ref 11.4–15.2)

## 2017-02-02 LAB — ECHOCARDIOGRAM COMPLETE
HEIGHTINCHES: 65 in
WEIGHTICAEL: 3163.2 [oz_av]

## 2017-02-02 LAB — GLUCOSE, CAPILLARY: GLUCOSE-CAPILLARY: 114 mg/dL — AB (ref 65–99)

## 2017-02-02 MED ORDER — ONDANSETRON HCL 4 MG/2ML IJ SOLN: 4.0000 mg | Freq: Four times a day (QID) | INTRAMUSCULAR | Status: DC | PRN

## 2017-02-02 MED ORDER — POTASSIUM CHLORIDE CRYS ER 20 MEQ PO TBCR
40.0000 meq | EXTENDED_RELEASE_TABLET | Freq: Two times a day (BID) | ORAL | Status: DC
  Administered 2017-02-02 – 2017-02-04 (×4): 40 meq via ORAL
  Filled 2017-02-02 (×4): qty 2

## 2017-02-02 MED ORDER — SODIUM CHLORIDE 0.9% FLUSH: 3.0000 mL | INTRAVENOUS | Status: DC | PRN

## 2017-02-02 MED ORDER — AMIODARONE HCL IN DEXTROSE 360-4.14 MG/200ML-% IV SOLN
60.0000 mg/h | INTRAVENOUS | Status: DC
Start: 2017-02-02 — End: 2017-02-02

## 2017-02-02 MED ORDER — DILTIAZEM LOAD VIA INFUSION
10.0000 mg | Freq: Once | INTRAVENOUS | Status: AC
  Administered 2017-02-02: 10 mg via INTRAVENOUS
  Filled 2017-02-02: qty 10

## 2017-02-02 MED ORDER — ACETAMINOPHEN 325 MG PO TABS: 650.0000 mg | ORAL_TABLET | ORAL | Status: DC | PRN

## 2017-02-02 MED ORDER — SODIUM CHLORIDE 0.9% FLUSH
3.0000 mL | Freq: Two times a day (BID) | INTRAVENOUS | Status: DC
  Administered 2017-02-02 (×2): 3 mL via INTRAVENOUS

## 2017-02-02 MED ORDER — SODIUM CHLORIDE 0.9% FLUSH
3.0000 mL | Freq: Two times a day (BID) | INTRAVENOUS | Status: DC
  Administered 2017-02-02: 3 mL via INTRAVENOUS

## 2017-02-02 MED ORDER — AMIODARONE HCL IN DEXTROSE 360-4.14 MG/200ML-% IV SOLN
30.0000 mg/h | INTRAVENOUS | Status: DC
Start: 2017-02-02 — End: 2017-02-02

## 2017-02-02 MED ORDER — FUROSEMIDE 10 MG/ML IJ SOLN
40.0000 mg | Freq: Four times a day (QID) | INTRAMUSCULAR | Status: DC
  Administered 2017-02-02: 40 mg via INTRAVENOUS
  Filled 2017-02-02: qty 4

## 2017-02-02 MED ORDER — SODIUM CHLORIDE 0.9 % IV SOLN: 250.0000 mL | INTRAVENOUS | Status: DC | PRN

## 2017-02-02 MED ORDER — DEXTROSE 5 % IV SOLN
5.0000 mg/h | INTRAVENOUS | Status: DC
  Administered 2017-02-02: 5 mg/h via INTRAVENOUS
  Filled 2017-02-02: qty 100

## 2017-02-02 MED ORDER — FUROSEMIDE 10 MG/ML IJ SOLN
80.0000 mg | Freq: Two times a day (BID) | INTRAMUSCULAR | Status: DC
  Administered 2017-02-02 – 2017-02-04 (×6): 80 mg via INTRAVENOUS
  Filled 2017-02-02 (×7): qty 8

## 2017-02-02 MED ORDER — SODIUM CHLORIDE 0.9 % IV SOLN: 250.0000 mL | INTRAVENOUS | Status: DC

## 2017-02-02 NOTE — Progress Notes (Signed)
   Transferred from [facility] for Advanced Heart Failure Management. Well known to the HF team.  In 2013 he had suspected tachy mediated cardiomyopathy from A Fib in 2013 complicated by cardiogenic shock. EF improved with medical management. Placed on coumadin for anticoagulation but had rectus sheath hematoma so coumadin stopped.   In May 2017 he had successful DC-CV. He has also tried eliquis but had nose bleed. Has history of rash on po amio.   Most recent ECHO 10/2016 EF 40-45% Grade II DD and normal RV.   Admitted to [facility] with increased dyspnea. Started on amio drip + diltiazem for A fib RVR. JVP up jaw. R and LLE warm 3+ edema. Abdomen distended. A Fib RVR 120s.    1. A fib RVR 2. A/C Systolic HF - NICM ECHO 10/2016 EF 40-45%. Grade II DD -> 20-25% 3. CKD III  Marked volume overload. Diurese with 80 mg IV lasix x 2. Stop diltiazem drip. Continue amio drip and heparin. Will need to try and restore regular rhythm. Plan TEE /DC-CV. Add ted hose. Follow renal function closely. He understands he will need anticoagulation after procedure.   [name] NP-C  10:26 AM   Patient seen and examined with [name], NP. We discussed all aspects of the encounter. I agree with the assessment and plan as stated above.   Patient well known to me from previous admissions with similar presentations in which he develops recurrent AF followed by severe LV dysfunction and cardiogenic shock. In the past has been successfully treated with amiodarone followed by electrical cardioversion. However once he improved he is unwilling to proceed with cath or continue anti-arrhythmic meds or anticoagulation.  He is now quite ill again with marked volume overload and progressive LV dysfunction. Echo reviewed personally today and LVEF now down to 20-25%. With moderate RV dysfunction. Not diuresing very well. AF running 120-130 on IV amio. Will plan TEE and DCCV in am. Continue heparin and IV amio. As he will not take amio  long-term and now having more frequent life-threatening episodes will discuss with EP regarding possible AVN ablation and CRT placement (vs AF ablation) - if he will agree to it.   The patient is critically ill with multiple organ systems failure and requires high complexity decision making for assessment and support, frequent evaluation and titration of therapies, application of advanced monitoring technologies and extensive interpretation of multiple databases.   Critical Care Time devoted to patient care services described in this note is 45 Minutes.  Dajsha Massaro,MD 5:08 PM

## 2017-02-02 NOTE — H&P (Addendum)
CARDIOLOGY INPATIENT HISTORY AND PHYSICAL EXAMINATION NOTE  Patient ID: Martin [name] MRN: [id number], DOB/AGE: November 21, 1946   Admit date: 01/31/2017   Primary Physician: No PCP Per Patient Primary Cardiologist: Nicholes Mango MD  Reason for admission: Atrial fib w/RVR and CHF  HPI: This is a 71 y.o. male with history of prior cardiomyopathy presents for followup after admission for atypical atrial flutter with RVR and acute systolic CHF. Prior admit on 4/13 significant for cardiogenic shock w/AF, LVEF 30-35% at that time. LVEF recovered after that admission. He was started on coumadin for atrial fibrillation but developed a rectus sheath hematoma (large) and anticoagulation was stopped. He stopped Eliquis secondary to nose bleeds and refuses any anticoagulation   Later on he was admitted to [facility] 05/17/16 with increased dyspnea progressively over the last few weeks. On admit creatine 1.7, K 4.4, BNP 905. Troponin 0.47 ->25.87 ->37.38.Due to worsening renal function and poor response to IV diuresis he transferred to [facility] for Advanced Heart Failure Team to manage. On arrival he was SOB at rest, 3+ lower extremity edema, hypotension, A fib RVR, and cool extremities. EF 20-25% Had central access placed to assess mixed venous saturation and guide diuresis. Placed on milrinone for cardiogenic shock as well as norepi and amio drip. Diuresed with IV lasix and once euvolemic he transitioned to lasix 40 mg daily. Overall he diuresed 29 pounds. Discharge weight 182 lbs. Failed TEE/DC-CV 05/20/16 for persistent A fib on IV amio.s/p successful DCCV 05/25/16  Patient says he was shoveling snow with the last snow and that's when his heart went out of rhythm. Didn't call but had progressive worsening shortness of breath and edema. Became hypotensive last night with IV Cardizem and stopped. Digoxin given but now back on low dose IV dilt 15mg /hr. Patient was placed on amiodarone drip due to  borderline blood pressures and was transferred to [facility] for further care.   Problem List: Past Medical History:  Diagnosis Date  . Acute blood loss anemia    Due to rectus sheath hematoma 03/2012  . Acute renal failure (HCC)    03/2012 (not on ACEI due to this) - renal US unremarkable  . Atrial fibrillation (HCC)   . Cardiomyopathy (HCC)    Transient cardiomyopathy with EF 30-35% 03/27/12, improved to 55-60% 04/16/12  . CHF (congestive heart failure) (HCC)   . Essential hypertension, benign   . Hematuria 03/2012  . Hyperglycemia 03/2012  . Pleural effusion 03/2012  . Rectus sheath hematoma    Spontaneous during 03/2012 hospitalization managed conservatively with blood products  . Respiratory failure (HCC)    VDRF 03/2012 in setting of CHF  . Shock (HCC)    03/2012 hospitalization - Cardiogenic shock requiring pressors initially, followed by hemorrhagic shock later in hospitalization  . Unspecified sleep apnea    Suspected during 03/2012 hospitalization     Past Surgical History:  Procedure Laterality Date  . CARDIOVERSION N/A 03/05/2015   Procedure: CARDIOVERSION;  Surgeon: Dolores Patty, MD;  Location: [facility] ENDOSCOPY;  Service: Cardiovascular;  Laterality: N/A;  . CARDIOVERSION N/A 05/20/2016   Procedure: CARDIOVERSION;  Surgeon: Dolores Patty, MD;  Location: [facility] OR;  Service: Cardiovascular;  Laterality: N/A;  . CARDIOVERSION N/A 05/25/2016   Procedure: CARDIOVERSION;  Surgeon: Vesta Mixer, MD;  Location: [facility] ENDOSCOPY;  Service: Cardiovascular;  Laterality: N/A;  . TEE WITHOUT CARDIOVERSION N/A 03/05/2015   Procedure: TRANSESOPHAGEAL ECHOCARDIOGRAM (TEE);  Surgeon: Dolores Patty, MD;  Location: [facility] ENDOSCOPY;  Service: Cardiovascular;  Laterality: N/A;  . TEE WITHOUT CARDIOVERSION N/A 05/20/2016   Procedure: TRANSESOPHAGEAL ECHOCARDIOGRAM (TEE);  Surgeon: Dolores Patty, MD;  Location: [facility] OR;  Service: Cardiovascular;  Laterality: N/A;     Allergies:  Allergies  Allergen  Reactions  . Amiodarone Rash    Rash resolved when amio stopped 2013, retried in 2017 with same reaction     Home Medications Current Facility-Administered Medications  Medication Dose Route Frequency Provider Last Rate Last Dose  . 0.9 %  sodium chloride infusion   Intravenous Once Bethann Berkshire, MD   Stopped at 01/31/17 2042  . 0.9 %  sodium chloride infusion  250 mL Intravenous PRN Pricilla Riffle, MD      . acetaminophen (TYLENOL) tablet 650 mg  650 mg Oral Q6H PRN Houston Siren, MD       Or  . acetaminophen (TYLENOL) suppository 650 mg  650 mg Rectal Q6H PRN Houston Siren, MD      . acetaminophen (TYLENOL) tablet 650 mg  650 mg Oral Q4H PRN Pricilla Riffle, MD      . amiodarone (NEXTERONE PREMIX) 360-4.14 MG/200ML-% (1.8 mg/mL) IV infusion  30 mg/hr Intravenous Continuous Dietrich Pates V, MD 16.7 mL/hr at 02/02/17 0615 30 mg/hr at 02/02/17 0615  . aspirin EC tablet 81 mg  81 mg Oral Daily Houston Siren, MD   81 mg at 02/01/17 0959  . atorvastatin (LIPITOR) tablet 80 mg  80 mg Oral q1800 Houston Siren, MD      . diltiazem (CARDIZEM) 100 mg in dextrose 5 % 100 mL (1 mg/mL) infusion  5-15 mg/hr Intravenous Continuous Joellyn Rued, MD 10 mL/hr at 02/02/17 0515 10 mg/hr at 02/02/17 0515  . furosemide (LASIX) tablet 40 mg  40 mg Oral Daily Houston Siren, MD   40 mg at 02/01/17 0959  . heparin ADULT infusion 100 units/mL (25000 units/240mL sodium chloride 0.45%)  1,200 Units/hr Intravenous Continuous Erick Blinks, MD 12 mL/hr at 02/02/17 0615 1,200 Units/hr at 02/02/17 0615  . ondansetron (ZOFRAN) tablet 4 mg  4 mg Oral Q6H PRN Houston Siren, MD       Or  . ondansetron [facility]) injection 4 mg  4 mg Intravenous Q6H PRN Houston Siren, MD      . pneumococcal 23 valent vaccine (PNU-IMMUNE) injection 0.5 mL  0.5 mL Intramuscular Tomorrow-1000 Houston Siren, MD      . sodium chloride flush (NS) 0.9 % injection 3 mL  3 mL Intravenous Q12H Pricilla Riffle, MD   3 mL at 02/02/17 0359  . sodium chloride flush (NS) 0.9 % injection 3 mL  3 mL  Intravenous PRN Dietrich Pates V, MD      . sodium chloride flush (NS) 0.9 % injection 3 mL  3 mL Intravenous Q12H Joellyn Rued, MD   3 mL at 02/02/17 0359     History reviewed. No pertinent family history.   Social History   Social History  . Marital status: Married    Spouse name: N/A  . Number of children: N/A  . Years of education: N/A   Occupational History  . Not on file.   Social History Main Topics  . Smoking status: Never Smoker  . Smokeless tobacco: Never Used  . Alcohol use Yes     Comment: former  . Drug use: No  . Sexual activity: Yes   Other Topics Concern  . Not on file   Social History Narrative  . No narrative on file  Review of Systems: General: weight gain to 197 lbs  Cardiovascular: dyspnea, PND, orthopnea, leg swelling Dermatological: negative for rash Respiratory: negative for cough or wheezing Urologic: negative for hematuria Abdominal: negative for nausea, vomiting, diarrhea, bright red blood per rectum, melena, or hematemesis Neurologic: negative for visual changes, syncope, or dizziness Endocrine: no diabetes, no hypothyroidism Immunological: no lymph adenopathy Psych: non homicidal/suicidal  Physical Exam: Vitals: BP 110/78   Pulse (!) 108   Temp 98 F (36.7 C) (Oral)   Resp (!) 24   Ht 5\' 5"  (1.651 m)   Wt 89.7 kg (197 lb 11.2 oz)   SpO2 99%   BMI 32.90 kg/m  General: not in acute distress Neck: JVP 10 cm, neck supple Heart: irregularly irregular HR, systolic grade II/VI murmur on left sternal border Lungs: rales bilaterally GI: non tender, non distended, bowel sounds present Extremities: 2+ edema Neuro: AAO x 3  Psych: normal affect, no anxiety   Labs:   Results for orders placed or performed during the hospital encounter of 01/31/17 (from the past 24 hour(s))  Troponin I     Status: Abnormal   Collection Time: 02/01/17 12:28 PM  Result Value Ref Range   Troponin I 0.45 (HH) <0.03 ng/mL  Glucose, capillary      Status: Abnormal   Collection Time: 02/01/17  4:30 PM  Result Value Ref Range   Glucose-Capillary 119 (H) 65 - 99 mg/dL   Comment 1 Notify RN    Comment 2 Document in Chart   Heparin level (unfractionated)     Status: None   Collection Time: 02/01/17  7:27 PM  Result Value Ref Range   Heparin Unfractionated 0.70 0.30 - 0.70 IU/mL  Basic metabolic panel     Status: Abnormal   Collection Time: 02/02/17  2:58 AM  Result Value Ref Range   Sodium 137 135 - 145 mmol/L   Potassium 4.1 3.5 - 5.1 mmol/L   Chloride 100 (L) 101 - 111 mmol/L   CO2 26 22 - 32 mmol/L   Glucose, Bld 129 (H) 65 - 99 mg/dL   BUN 32 (H) 6 - 20 mg/dL   Creatinine, Ser 9.14 (H) 0.61 - 1.24 mg/dL   Calcium 8.6 (L) 8.9 - 10.3 mg/dL   GFR calc non Af Amer 41 (L) >60 mL/min   GFR calc Af Amer 48 (L) >60 mL/min   Anion gap 11 5 - 15  Brain natriuretic peptide     Status: Abnormal   Collection Time: 02/02/17  2:58 AM  Result Value Ref Range   B Natriuretic Peptide 820.8 (H) 0.0 - 100.0 pg/mL  TSH     Status: None   Collection Time: 02/02/17  2:58 AM  Result Value Ref Range   TSH 1.092 0.350 - 4.500 uIU/mL  Troponin I     Status: Abnormal   Collection Time: 02/02/17  2:58 AM  Result Value Ref Range   Troponin I 0.33 (HH) <0.03 ng/mL  Heparin level (unfractionated)     Status: None   Collection Time: 02/02/17  3:26 AM  Result Value Ref Range   Heparin Unfractionated 0.46 0.30 - 0.70 IU/mL  CBC     Status: Abnormal   Collection Time: 02/02/17  3:26 AM  Result Value Ref Range   WBC 8.4 4.0 - 10.5 K/uL   RBC 4.97 4.22 - 5.81 MIL/uL   Hemoglobin 15.2 13.0 - 17.0 g/dL   HCT 78.2 95.6 - 21.3 %   MCV 93.8 78.0 - 100.0 fL  MCH 30.6 26.0 - 34.0 pg   MCHC 32.6 30.0 - 36.0 g/dL   RDW 16.1 09.6 - 04.5 %   Platelets 129 (L) 150 - 400 K/uL     Radiology/Studies: Dg Chest Portable 1 View  Result Date: 01/31/2017 CLINICAL DATA:  Shortness of breath for several weeks EXAM: PORTABLE CHEST 1 VIEW COMPARISON:  05/18/2016  FINDINGS: Cardiac shadow is enlarged. The lungs are well aerated bilaterally. There is improved aeration in the right lung base when compared with the prior exam. No acute bony abnormality is seen. IMPRESSION: No active disease. Electronically Signed   By: Alcide Clever M.D.   On: 01/31/2017 18:51    EKG: atrial fibrillation with RVR  Echo:  11/03/16 EF 40-45% Severe LAE  Cardiac cath: not available  Medical decision making:  Discussed care with the patient Discussed care with the physician on the phone Reviewed labs and imaging personally Reviewed prior records  ASSESSMENT AND PLAN:  This is a 71 y.o. male with possible ischemic CMP, atypical atrial flutter/fib with persistent Afib s/p 2 DCCV presented with CHF symptoms and afib with RVR.    Active Problems:   CKD (chronic kidney disease) stage 3, GFR 30-59 ml/min   Atrial fibrillation with RVR (HCC)   Acute on chronic combined systolic and diastolic heart failure (HCC)   Acute on chronic renal failure (HCC)   A-fib (HCC)   CHF (congestive heart failure) (HCC)   Acute on chronic CHF, likely diastolic since LVEF recovered - IV lasix, daily wts, low sodium diet, strict I/Os, BID BMP, fluid restrict  CKD stage 3 with acute component BMP BID, IV lasix  Coronary artery disease possible, lexiscan showed apical septal and apical inferior ischemia. Pt refused LHC and statin, trop elevated again but flat. On IV heparin for anticoagulation and possible DCCV  Atrial fib w/RVR (CHADS2VASc score = 3 age, CHF, CAD) - on dilt drip and amiodarone IV, rate better controlled. Holding oral A/C for possible procedures   Signed, Joellyn Rued, MD MS 02/02/2017, 6:39 AM

## 2017-02-02 NOTE — Care Management Note (Signed)
Case Management Note  Patient Details  Name: Martin [name] MRN: [id number] Date of Birth: February 20, 1946  Subjective/Objective:        Adm w arrthymia, chf            Action/Plan:lives at home   Expected Discharge Date:                  Expected Discharge Plan:  Home w Home Health Services  In-House Referral:     Discharge planning Services     Post Acute Care Choice:    Choice offered to:     DME Arranged:    DME Agency:     HH Arranged:    HH Agency:     Status of Service:  In process, will continue to follow  If discussed at Long Length of Stay Meetings, dates discussed:    Additional Comments:will moniter for dc needs as pt progresses.  [name], RN 02/02/2017, 10:10 AM

## 2017-02-02 NOTE — Progress Notes (Signed)
  Echocardiogram 2D Echocardiogram has been performed.  Arvil Chaco 02/02/2017, 2:54 PM

## 2017-02-02 NOTE — Progress Notes (Signed)
ANTICOAGULATION CONSULT NOTE - Follow-up Consult  Pharmacy Consult for HEPARIN Indication: atrial fibrillation  Allergies  Allergen Reactions  . Amiodarone Rash    Rash resolved when amio stopped 2013, retried in 2017 with same reaction   Patient Measurements: Height: 5\' 5"  (165.1 cm) Weight: 197 lb 11.2 oz (89.7 kg) IBW/kg (Calculated) : 61.5 HEPARIN DW (KG): 78.6  Vital Signs: Temp: 98.4 F (36.9 C) (02/08 0815) Temp Source: Oral (02/08 0815) BP: 110/89 (02/08 0815) Pulse Rate: 134 (02/08 0600)  Labs:  Recent Labs  01/31/17 1900  02/01/17 0629 02/01/17 1228 02/01/17 1927 02/02/17 0258 02/02/17 0326 02/02/17 0717  HGB 16.2  --  15.4  --   --   --  15.2  --   HCT 48.8  --  46.3  --   --   --  46.6  --   PLT 153  --  131*  --   --   --  129*  --   LABPROT  --   --   --   --   --   --   --  15.4*  INR  --   --   --   --   --   --   --  1.21  HEPARINUNFRC  --   --   --   --  0.70  --  0.46  --   CREATININE 1.98*  --  1.83*  --   --  1.65*  --  1.54*  TROPONINI  --   < > 0.54* 0.45*  --  0.33*  --  0.30*  < > = values in this interval not displayed. Estimated Creatinine Clearance: 47.3 mL/min (by C-G formula based on SCr of 1.54 mg/dL (H)).  Assessment: 71yo male on heparin for afib. Transferred from [facility]. Heparin level remains therapeutic (0.46) on gtt at 1200 units/hr. Plans noted for TEE/DCCV  Goal of Therapy:  Heparin level 0.3-0.7 units/ml Monitor platelets by anticoagulation protocol: Yes   Plan:  Continue Heparin infusion at 1200 units/hr Check heparin level and CBC daily while on Heparin Will follow anticoagulation plans  Harland German, Pharm D 02/02/2017 11:22 AM

## 2017-02-02 NOTE — Progress Notes (Signed)
ANTICOAGULATION CONSULT NOTE - Follow-up Consult  Pharmacy Consult for HEPARIN Indication: atrial fibrillation  Allergies  Allergen Reactions  . Amiodarone Rash    Rash resolved when amio stopped 2013, retried in 2017 with same reaction   Patient Measurements: Height: 5\' 5"  (165.1 cm) Weight: 195 lb 1.7 oz (88.5 kg) IBW/kg (Calculated) : 61.5 HEPARIN DW (KG): 78.6  Vital Signs: Temp: 97.7 F (36.5 C) (02/08 0000) Temp Source: Axillary (02/08 0000) BP: 93/81 (02/08 0100) Pulse Rate: 94 (02/08 0100)  Labs:  Recent Labs  01/31/17 1854 01/31/17 1900 02/01/17 0108 02/01/17 0629 02/01/17 1228 02/01/17 1927  HGB 18.0*  18.0* 16.2  --  15.4  --   --   HCT 53.0*  53.0* 48.8  --  46.3  --   --   PLT  --  153  --  131*  --   --   HEPARINUNFRC  --   --   --   --   --  0.70  CREATININE 2.10*  2.10* 1.98*  --  1.83*  --   --   TROPONINI  --   --  0.56* 0.54* 0.45*  --    Estimated Creatinine Clearance: 39.5 mL/min (by C-G formula based on SCr of 1.83 mg/dL (H)).  Assessment: 71yo male on heparin for afib. Transferred from [facility]. Heparin level therapeutic (0.7) on gtt at 1200 units/hr last night.   Goal of Therapy:  Heparin level 0.3-0.7 units/ml Monitor platelets by anticoagulation protocol: Yes   Plan:  Continue Heparin infusion at 1200 units/hr Check heparin level and CBC daily while on Heparin  Christoper Fabian, PharmD, BCPS Clinical pharmacist, pager [phone] 02/02/2017,3:17 AM

## 2017-02-03 ENCOUNTER — Inpatient Hospital Stay (HOSPITAL_COMMUNITY): Payer: Medicare Other | Admitting: Anesthesiology

## 2017-02-03 ENCOUNTER — Inpatient Hospital Stay (HOSPITAL_COMMUNITY): Payer: Medicare Other

## 2017-02-03 ENCOUNTER — Encounter (HOSPITAL_COMMUNITY)
Admission: EM | Disposition: A | Discharge status: Home / Self Care | Payer: Self-pay | Source: Home / Self Care | Attending: Cardiovascular Disease

## 2017-02-03 ENCOUNTER — Encounter (HOSPITAL_COMMUNITY): Payer: Self-pay

## 2017-02-03 DIAGNOSIS — I340 Nonrheumatic mitral (valve) insufficiency: Secondary | ICD-10-CM

## 2017-02-03 HISTORY — PX: TEE WITHOUT CARDIOVERSION: SHX5443

## 2017-02-03 HISTORY — PX: CARDIOVERSION: SHX1299

## 2017-02-03 LAB — BASIC METABOLIC PANEL
ANION GAP: 12 (ref 5–15)
ANION GAP: 10 (ref 5–15)
BUN: 29 mg/dL — ABNORMAL HIGH (ref 6–20)
BUN: 26 mg/dL — ABNORMAL HIGH (ref 6–20)
CALCIUM: 8.7 mg/dL — AB (ref 8.9–10.3)
CO2: 30 mmol/L (ref 22–32)
CO2: 29 mmol/L (ref 22–32)
Calcium: 8.5 mg/dL — ABNORMAL LOW (ref 8.9–10.3)
Chloride: 93 mmol/L — ABNORMAL LOW (ref 101–111)
Chloride: 97 mmol/L — ABNORMAL LOW (ref 101–111)
Creatinine, Ser: 1.71 mg/dL — ABNORMAL HIGH (ref 0.61–1.24)
Creatinine, Ser: 1.67 mg/dL — ABNORMAL HIGH (ref 0.61–1.24)
GFR calc Af Amer: 46 mL/min — ABNORMAL LOW (ref >60)
GFR calc Af Amer: 47 mL/min — ABNORMAL LOW (ref >60)
GFR calc non Af Amer: 39 mL/min — ABNORMAL LOW (ref >60)
GFR calc non Af Amer: 40 mL/min — ABNORMAL LOW (ref >60)
GLUCOSE: 116 mg/dL — AB (ref 65–99)
GLUCOSE: 130 mg/dL — AB (ref 65–99)
POTASSIUM: 4.3 mmol/L (ref 3.5–5.1)
POTASSIUM: 6 mmol/L — AB (ref 3.5–5.1)
Sodium: 135 mmol/L (ref 135–145)
Sodium: 136 mmol/L (ref 135–145)

## 2017-02-03 LAB — CBC
HEMATOCRIT: 50.7 % (ref 39.0–52.0)
Hemoglobin: 16.7 g/dL (ref 13.0–17.0)
MCH: 30.8 pg (ref 26.0–34.0)
MCHC: 32.9 g/dL (ref 30.0–36.0)
MCV: 93.4 fL (ref 78.0–100.0)
Platelets: 137 10*3/uL — ABNORMAL LOW (ref 150–400)
RBC: 5.43 MIL/uL (ref 4.22–5.81)
RDW: 14.5 % (ref 11.5–15.5)
WBC: 8.3 10*3/uL (ref 4.0–10.5)

## 2017-02-03 LAB — HEPARIN LEVEL (UNFRACTIONATED)
Heparin Unfractionated: 0.9 IU/mL — ABNORMAL HIGH (ref 0.30–0.70)
Heparin Unfractionated: 0.69 IU/mL (ref 0.30–0.70)

## 2017-02-03 LAB — HEMOGLOBIN A1C
Hgb A1c MFr Bld: 6 % — ABNORMAL HIGH (ref 4.8–5.6)
Mean Plasma Glucose: 126 mg/dL

## 2017-02-03 LAB — GLUCOSE, CAPILLARY: GLUCOSE-CAPILLARY: 119 mg/dL — AB (ref 65–99)

## 2017-02-03 SURGERY — ECHOCARDIOGRAM, TRANSESOPHAGEAL
Anesthesia: General

## 2017-02-03 MED ORDER — LIDOCAINE VISCOUS 2 % MT SOLN
OROMUCOSAL | Status: DC | PRN
  Administered 2017-02-03: 15 mL via OROMUCOSAL

## 2017-02-03 MED ORDER — PROPOFOL 500 MG/50ML IV EMUL
INTRAVENOUS | Status: DC | PRN
  Administered 2017-02-03: 125 ug/kg/min via INTRAVENOUS

## 2017-02-03 MED ORDER — PHENYLEPHRINE HCL 10 MG/ML IJ SOLN
INTRAMUSCULAR | Status: DC | PRN
  Administered 2017-02-03 (×2): 120 ug via INTRAVENOUS

## 2017-02-03 MED ORDER — LIDOCAINE VISCOUS 2 % MT SOLN
OROMUCOSAL | Status: AC
  Filled 2017-02-03: qty 15

## 2017-02-03 MED ORDER — SODIUM CHLORIDE 0.9 % IV SOLN: INTRAVENOUS | Status: DC

## 2017-02-03 MED ORDER — LIDOCAINE HCL (CARDIAC) 20 MG/ML IV SOLN
INTRAVENOUS | Status: DC | PRN
  Administered 2017-02-03: 50 mg via INTRATRACHEAL

## 2017-02-03 NOTE — Transfer of Care (Signed)
Immediate Anesthesia Transfer of Care Note  Patient: Martin [name]  Procedure(s) Performed: Procedure(s): TRANSESOPHAGEAL ECHOCARDIOGRAM (TEE) (N/A) CARDIOVERSION (N/A)  Patient Location: Endoscopy Unit  Anesthesia Type:General  Level of Consciousness: awake, alert , oriented and patient cooperative  Airway & Oxygen Therapy: Patient Spontanous Breathing and Patient connected to nasal cannula oxygen  Post-op Assessment: Report given to RN and Post -op Vital signs reviewed and stable  Post vital signs: Reviewed and stable  Last Vitals:  Vitals:   02/03/17 1011 02/03/17 1124  BP: (!) 131/95 97/74  Pulse: (!) 137 73  Resp: 20 20  Temp:  36.6 C    Last Pain:  Vitals:   02/03/17 1124  TempSrc: Axillary  PainSc:          Complications: No apparent anesthesia complications

## 2017-02-03 NOTE — Consult Note (Signed)
ELECTROPHYSIOLOGY CONSULT NOTE    Patient ID: Martin [name] MRN: [id number], DOB/AGE: 06/19/1946 71 y.o.  Admit date: 01/31/2017 Date of Consult: 02/03/2017   Primary Physician: No PCP Per Patient Primary Cardiologist: Dr. Gala Romney Requesting MD: Dr. Gala Romney  Reason for Consultation: AFib, RVR  HPI: Martin [name] is a 71 y.o. male with PMHx of atypical aflutter w/RVR and AFib that he has had cardiogengic shock with historically with decline in his EF and noted improvement with SR.  Initially on warfarin for a/c though suffered a rectus sheath hematoma requiring this to be stopped, subsequently started on Eliquis with resultant recurrent nose bleeds, and going forward the patient has declined anticoagulation, outside of short term use here peri-cardioversion.  Notes report he is suspect to have OSA though has cancelled his sleep test out patient.  In May 2015 hospitalized at [facility] with CHF, Trop as high as 37, initially not cathed given ARF, eventually had an abnormal stress test recommended cath but the patient did not want to proceed (also declined statin).  Record mention he developed rash with amiodarone at some point, is OK with using this short term, but not willing to long term.  Dr. Prescott Gum last office not states alternative AAD options were mentioned bu the patient and perhaps ablation, while he would be willing to discuss a procedure, was reluctant at least to consider other meds.  (Also note he refused addition HF medicines such as ARB or spironolactone).  The patient was admitted to 01/31/17 to [facility] with c/o progressively worsening SOB, felt he had gone out of rhythm for a few days prior to coming in but not particularly symptomatic until the day or so prior to seeking attention.  He was noted in rapid AFib initially treated with diltiazem gtt though hypotension required it be stopped and given IV dig, eventually placed back on the dilt gtt, given his history of instability with his AF  and difficulty controlling rate, he was transferred to [facility] 02/01/17.  He had marked volume OL and CHF team has been addressing, dilt gtt was stopped and changed to amiodarone gtt (and has been on heparin gtt).  The patient underwent today TEE/DCCV successfully restoring SR.  He is feeling well at this time without active complaints.  AFib hx 1st diagnose 03/2012 w/RVR associated with cardiogenic shock and reduced EF With return of SR and medicine EF improved May 2017 recurrent AF w/RVR and CHF/shock requiring milrinone, pressors. DCCV 05/20/17 failed DCCV 05/25/16 successful on amiodarone gtt Feb 2018 recurrent CHF w rapid AFib 02/03/17 DCCV successful on IV amiodarone  Past Medical History:  Diagnosis Date  . Acute blood loss anemia    Due to rectus sheath hematoma 03/2012  . Acute renal failure (HCC)    03/2012 (not on ACEI due to this) - renal US unremarkable  . Atrial fibrillation (HCC)   . Cardiomyopathy (HCC)    Transient cardiomyopathy with EF 30-35% 03/27/12, improved to 55-60% 04/16/12  . CHF (congestive heart failure) (HCC)   . Essential hypertension, benign   . Hematuria 03/2012  . Hyperglycemia 03/2012  . Pleural effusion 03/2012  . Rectus sheath hematoma    Spontaneous during 03/2012 hospitalization managed conservatively with blood products  . Respiratory failure (HCC)    VDRF 03/2012 in setting of CHF  . Shock (HCC)    03/2012 hospitalization - Cardiogenic shock requiring pressors initially, followed by hemorrhagic shock later in hospitalization  . Unspecified sleep apnea    Suspected during 03/2012 hospitalization  Surgical History:  Past Surgical History:  Procedure Laterality Date  . CARDIOVERSION N/A 03/05/2015   Procedure: CARDIOVERSION;  Surgeon: Dolores Patty, MD;  Location: [facility] ENDOSCOPY;  Service: Cardiovascular;  Laterality: N/A;  . CARDIOVERSION N/A 05/20/2016   Procedure: CARDIOVERSION;  Surgeon: Dolores Patty, MD;  Location: [facility] OR;  Service:  Cardiovascular;  Laterality: N/A;  . CARDIOVERSION N/A 05/25/2016   Procedure: CARDIOVERSION;  Surgeon: Vesta Mixer, MD;  Location: [facility] ENDOSCOPY;  Service: Cardiovascular;  Laterality: N/A;  . TEE WITHOUT CARDIOVERSION N/A 03/05/2015   Procedure: TRANSESOPHAGEAL ECHOCARDIOGRAM (TEE);  Surgeon: Dolores Patty, MD;  Location: [facility] ENDOSCOPY;  Service: Cardiovascular;  Laterality: N/A;  . TEE WITHOUT CARDIOVERSION N/A 05/20/2016   Procedure: TRANSESOPHAGEAL ECHOCARDIOGRAM (TEE);  Surgeon: Dolores Patty, MD;  Location: [facility] OR;  Service: Cardiovascular;  Laterality: N/A;     Prescriptions Prior to Admission  Medication Sig Dispense Refill Last Dose  . atorvastatin (LIPITOR) 80 MG tablet Take 1 tablet (80 mg total) by mouth daily at 6 PM. 30 tablet 6 01/30/2017 at Unknown time  . furosemide (LASIX) 40 MG tablet Take 1 tablet (40 mg total) by mouth daily. Take an extra 40mg  (1 tablet) for weight 190lbs or greater 15 tablet 6 01/31/2017 at Unknown time  . Multiple Vitamin (MULTIVITAMIN WITH MINERALS) TABS tablet Take 1 tablet by mouth daily.   01/30/2017 at Unknown time  . carvedilol (COREG) 3.125 MG tablet Take 1 tablet (3.125 mg total) by mouth 2 (two) times daily. (Patient not taking: Reported on 01/31/2017) 60 tablet 3 Not Taking at Unknown time    Inpatient Medications:  . aspirin EC  81 mg Oral Daily  . atorvastatin  80 mg Oral q1800  . furosemide  80 mg Intravenous BID  . pneumococcal 23 valent vaccine  0.5 mL Intramuscular Tomorrow-1000  . potassium chloride  40 mEq Oral BID    Allergies:  Allergies  Allergen Reactions  . Amiodarone Rash    Rash resolved when amio stopped 2013, retried in 2017 with same reaction    Social History   Social History  . Marital status: Married    Spouse name: N/A  . Number of children: N/A  . Years of education: N/A   Occupational History  . Not on file.   Social History Main Topics  . Smoking status: Never Smoker  . Smokeless tobacco: Never  Used  . Alcohol use Yes     Comment: former  . Drug use: No  . Sexual activity: Yes   Other Topics Concern  . Not on file   Social History Narrative  . No narrative on file     Family History  Problem Relation Age of Onset  . Emphysema Father   . Heart disease Father   . Other Mother     died during a leg surgery  . Diabetes Brother   . Heart attack Brother      Review of Systems: All other systems reviewed and are otherwise negative except as noted above.  Physical Exam: Vitals:   02/03/17 1330 02/03/17 1400 02/03/17 1500 02/03/17 1626  BP: 122/78 (!) 115/101 114/77 112/88  Pulse:  71 72 71  Resp: (!) 21 18 (!) 26 (!) 24  Temp:    97.6 F (36.4 C)  TempSrc:    Oral  SpO2:  98% 97% 99%  Weight:      Height:          GEN- The patient is well appearing, alert  and oriented x 3 today.   HEENT: normocephalic, atraumatic; sclera clear, conjunctiva pink; hearing intact; oropharynx clear; neck supple, no JVP Lymph- no cervical lymphadenopathy Lungs- CTA b/l, normal work of breathing.  No wheezes, rales, rhonchi Heart-  RRR, no murmurs, rubs or gallops, PMI not laterally displaced GI- soft, non-tender, non-distended, bowel sounds present Extremities- no clubbing, cyanosis, 1++ edema MS- no significant deformity or atrophy Skin- warm and dry, no rash or lesion Psych- euthymic mood, full affect Neuro- no gross deficits observed  Labs:   Lab Results  Component Value Date   WBC 8.3 02/03/2017   HGB 16.7 02/03/2017   HCT 50.7 02/03/2017   MCV 93.4 02/03/2017   PLT 137 (L) 02/03/2017    Recent Labs Lab 01/31/17 1900  02/03/17 1627  NA 140  < > 136  K 4.3  < > 6.0*  CL 103  < > 97*  CO2 26  < > 29  BUN 42*  < > 26*  CREATININE 1.98*  < > 1.67*  CALCIUM 9.2  < > 8.5*  PROT 6.7  --   --   BILITOT 1.8*  --   --   ALKPHOS 85  --   --   ALT 122*  --   --   AST 78*  --   --   GLUCOSE 154*  < > 130*  < > = values in this interval not displayed.      Radiology/Studies:  Dg Chest Portable 1 View Result Date: 01/31/2017 CLINICAL DATA:  Shortness of breath for several weeks EXAM: PORTABLE CHEST 1 VIEW COMPARISON:  05/18/2016 FINDINGS: Cardiac shadow is enlarged. The lungs are well aerated bilaterally. There is improved aeration in the right lung base when compared with the prior exam. No acute bony abnormality is seen. IMPRESSION: No active disease. Electronically Signed   By: Alcide Clever M.D.   On: 01/31/2017 18:51    EKG: #1 AFib 173bpm, PVCs Today is SR TELEMETRY: AFib w/RVR 120's-140's >> SR 60's  01/02/17: TTE Study Conclusions - Left ventricle: The cavity size was normal. Wall thickness was   normal. Systolic function was severely reduced. The estimated   ejection fraction was in the range of 20% to 25%. Severe diffuse   hypokinesis with no identifiable regional variations. - Mitral valve: Moderate, holosystolicprolapse, involving the   anterior leaflet. There was moderate to severe regurgitation   directed eccentrically and posteriorly. - Left atrium: The atrium was severely dilated. - Right ventricle: Systolic function was moderately reduced. - Right atrium: The atrium was moderately dilated.    Assessment and Plan:   1. Paroxysmal Afib w/RVR     CHA2DS2Vasc is 2, historically on a/c though with bleeding, currently on heparin gtt     Dr. Elberta Fortis discussed at length with the patient recommendation to retry a/c, he seemed agreeable to a long term plan  The patient historically apparently had a rash with amiodarone, he mentions one medicine made his skin burn in the sun. Discussed possibly using alternative AAD, he seemed agreeable to this as well, though historically seems to have been against medicine options.  Dr. Elberta Fortis Annarae Macnair discuss the case with Dr. Gala Romney who knows the patient well.   2. Acute CHF     Cardiomyopathy     Likely RVR given historically has improved with SR     Unknown coronary anatomy with an  abnormal stress test  3. ARI   Norma Fredrickson, Cordelia Poche 02/03/2017 6:56 PM   I  have seen and examined this patient with Francis Dowse.  Agree with above, note added to reflect my findings.  On exam, regular rhythm, no murmurs, lungs clear.  Presented to the hospital with a HF exacerbation. Was found to be in atrial fibrillation. Cardioverted to sinus rhythm and now negative 1.2L. Feeling much improved. Has EF of 20-25% with a severely dilated LA. He is currently on amiodarone but has a history of noncompliance and HF when in AF. He states that he is willing to take medications, but has a history of noncompliance. Would potentially benefit from CRT-P with AV nodal ablation to help control is HR.  Risks and benefits discussed. He would like think about this over the weekend.   Coalton Arch M. Tokiko Diefenderfer MD 02/03/2017 7:06 PM

## 2017-02-03 NOTE — Progress Notes (Signed)
  Echocardiogram Echocardiogram Transesophageal has been performed.  Nolon Rod 02/03/2017, 1:29 PM

## 2017-02-03 NOTE — Op Note (Signed)
    TRANSESOPHAGEAL ECHOCARDIOGRAM GUIDED DIRECT CURRENT CARDIOVERSION  NAME:  Martin [name]   MRN: [id number] DOB:  [birth year]   ADMIT DATE: 01/31/2017  INDICATIONS: AFib with RVR   PROCEDURE:   Informed consent was obtained prior to the procedure. The risks, benefits and alternatives for the procedure were discussed and the patient comprehended these risks.  Risks include, but are not limited to, cough, sore throat, vomiting, nausea, somnolence, esophageal and stomach trauma or perforation, bleeding, low blood pressure, aspiration, pneumonia, infection, trauma to the teeth and death.    After a procedural time-out, the patient was sedated by the anesthesia service with IV propofol.  The transesophageal probe was inserted in the esophagus and stomach without difficulty and multiple views were obtained.    FINDINGS:  LEFT VENTRICLE: EF = 15-20% severe global HK  RIGHT VENTRICLE: Severe global HK  LEFT ATRIUM: Moderately to severely dilated. LA diameter 5.6 cm  LEFT ATRIAL APPENDAGE: No clot  RIGHT ATRIUM: Dilated  AORTIC VALVE:  Trileaflet. Trivial AI  MITRAL VALVE:    Moderate to severe eccentric MR  TRICUSPID VALVE: Mild TR     PULMONIC VALVE: Mild PI  INTERATRIAL SEPTUM: No PFO or ASD  PERICARDIUM: No effusion  DESCENDING AORTA: No significant plaque   CARDIOVERSION:     Indications:  Atrial Fibrillation  Procedure Details:  Once the TEE was complete, the patient had the defibrillator pads placed in the anterior and posterior position. Once an appropriate level of sedation was achieved, the patient received a single biphasic, synchronized 200J shock with prompt conversion to sinus rhythm. No apparent complications.  COMPLICATIONS:    There were no immediate complications.   CONCLUSION:   1.  Successful TEE guided cardioversion.   Bensimhon, Daniel,MD 11:30 AM

## 2017-02-03 NOTE — Progress Notes (Signed)
Advanced Heart Failure Rounding Note   Subjective:    Diuresed well overnight. Weight down 8 pounds. Breathing much better. Remains in AF with RVR    Objective:   Weight Range:  Vital Signs:   Temp:  [97.7 F (36.5 C)-98.3 F (36.8 C)] 97.9 F (36.6 C) (02/09 0830) Pulse Rate:  [33-126] 93 (02/09 0830) Resp:  [21-32] 23 (02/09 0830) BP: (100-127)/(63-101) 112/101 (02/09 0830) SpO2:  [96 %-100 %] 99 % (02/09 0300) Weight:  [86 kg (189 lb 9.5 oz)] 86 kg (189 lb 9.5 oz) (02/09 0500) Last BM Date: 02/01/17  Weight change: Filed Weights   02/01/17 0500 02/02/17 0248 02/03/17 0500  Weight: 88.5 kg (195 lb 1.7 oz) 89.7 kg (197 lb 11.2 oz) 86 kg (189 lb 9.5 oz)    Intake/Output:   Intake/Output Summary (Last 24 hours) at 02/03/17 0853 Last data filed at 02/03/17 0600  Gross per 24 hour  Intake          1329.28 ml  Output             3805 ml  Net         -2475.72 ml     Physical Exam: General:  Lying in bed NAD HEENT: normal poor dentition Neck: supple. JVP jaw. Carotids 2+ bilat; no bruits. No lymphadenopathy or thryomegaly appreciated. Cor: PMI nondisplaced. Irregular rate & rhythm. No rubs, gallops or murmurs. Lungs: crackles att bases Abdomen: soft, nontender, distended. No hepatosplenomegaly. No bruits or masses. Good bowel sounds. Extremities: no cyanosis, clubbing, rash,2+  edema Neuro: alert & orientedx3, cranial nerves grossly intact. moves all 4 extremities w/o difficulty. Affect pleasant  Telemetry: AF 130s  Labs: Basic Metabolic Panel:  Recent Labs Lab 02/01/17 0629 02/02/17 0258 02/02/17 0717 02/02/17 1621 02/03/17 0313  NA 138 137 136 135 135  K 4.6 4.1 3.8 4.3 4.3  CL 101 100* 100* 95* 93*  CO2 28 26 24 28 30   GLUCOSE 113* 129* 111* 121* 116*  BUN 40* 32* 29* 28* 29*  CREATININE 1.83* 1.65* 1.54* 1.74* 1.71*  CALCIUM 8.7* 8.6* 8.5* 8.6* 8.7*    Liver Function Tests:  Recent Labs Lab 01/31/17 1900  AST 78*  ALT 122*  ALKPHOS 85   BILITOT 1.8*  PROT 6.7  ALBUMIN 3.4*   No results for input(s): LIPASE, AMYLASE in the last 168 hours. No results for input(s): AMMONIA in the last 168 hours.  CBC:  Recent Labs Lab 01/31/17 1854 01/31/17 1900 02/01/17 0629 02/02/17 0326 02/03/17 0313  WBC  --  7.9 8.8 8.4 8.3  HGB 18.0*  18.0* 16.2 15.4 15.2 16.7  HCT 53.0*  53.0* 48.8 46.3 46.6 50.7  MCV  --  94.8 94.5 93.8 93.4  PLT  --  153 131* 129* 137*    Cardiac Enzymes:  Recent Labs Lab 02/01/17 0629 02/01/17 1228 02/02/17 0258 02/02/17 0717 02/02/17 1357  TROPONINI 0.54* 0.45* 0.33* 0.30* 0.26*    BNP: BNP (last 3 results)  Recent Labs  05/17/16 1250 01/31/17 1900 02/02/17 0258  BNP 905.0* 1,005.0* 820.8*    ProBNP (last 3 results) No results for input(s): PROBNP in the last 8760 hours.    Other results:  Imaging:  No results found.   Medications:     Scheduled Medications: . sodium chloride   Intravenous Once  . aspirin EC  81 mg Oral Daily  . atorvastatin  80 mg Oral q1800  . furosemide  80 mg Intravenous BID  . pneumococcal 23  valent vaccine  0.5 mL Intramuscular Tomorrow-1000  . potassium chloride  40 mEq Oral BID  . sodium chloride flush  3 mL Intravenous Q12H  . sodium chloride flush  3 mL Intravenous Q12H  . sodium chloride flush  3 mL Intravenous Q12H     Infusions: . sodium chloride    . sodium chloride    . amiodarone 30 mg/hr (02/03/17 0504)  . heparin 1,000 Units/hr (02/03/17 0504)     PRN Medications:  sodium chloride, acetaminophen **OR** acetaminophen, acetaminophen, ondansetron **OR** ondansetron (ZOFRAN) IV, sodium chloride flush, sodium chloride flush   Assessment:   1. Acute systolic HF. Echo 02/03/17 EF 20-25%. (down from 40-45% 2. AF with RVR    -This patients CHA2DS2-VASc Score and unadjusted Ischemic Stroke Rate (% per year) is equal to 4.8 % stroke rate/year from a score of 4 3. AKI 4. Presumed CAD  Plan/Discussion:     He remains in  AF with RVR - like previous events, rate not responding to amiodarone and EF getting worse quickly. Will plan TEE and DC-CV later today.  He refuses to take amio long-term or be anticoagulated due to previous complications. This is the 3rd episode he has had in several years and each time he has wound up with severe LV dysfunction and severe HF/shock. Will ask EP to see for possible AVN ablation with CRT. (versus AF ablation)  Continue diuresis and heparin for now. He is willing to use [facility] for several weeks but not long-term at this point due to previous rectus sheath hematoma.   Length of Stay: 3   Daleyza Gadomski MD 02/03/2017, 8:53 AM  Advanced Heart Failure Team Pager [phone] (M-F; 7a - 4p)  Please contact [facility] for night-coverage after hours (4p -7a ) and weekends on [email or web address]

## 2017-02-03 NOTE — Progress Notes (Signed)
Pharmacist Heart Failure Core Measure Documentation  Assessment: Martin [name] has an EF documented as 20-25%* on 02/02/17 by ECHO.  Rationale: Heart failure patients with left ventricular systolic dysfunction (LVSD) and an EF < 40% should be prescribed an angiotensin converting enzyme inhibitor (ACEI) or angiotensin receptor blocker (ARB) at discharge unless a contraindication is documented in the medical record.  This patient is not currently on an ACEI or ARB for HF.  This note is being placed in the record in order to provide documentation that a contraindication to the use of these agents is present for this encounter.  ACE Inhibitor or Angiotensin Receptor Blocker is contraindicated (specify all that apply)  []   ACEI allergy AND ARB allergy []   Angioedema []   Moderate or severe aortic stenosis []   Hyperkalemia []   Hypotension []   Renal artery stenosis [x]   Worsening renal function, preexisting renal disease or dysfunction   [name] 02/03/2017 12:34 PM

## 2017-02-03 NOTE — Interval H&P Note (Signed)
History and Physical Interval Note:  02/03/2017 11:27 AM  Martin [name]  has presented today for surgery, with the diagnosis of a fib  The various methods of treatment have been discussed with the patient and family. After consideration of risks, benefits and other options for treatment, the patient has consented to  Procedure(s): TRANSESOPHAGEAL ECHOCARDIOGRAM (TEE) (N/A) CARDIOVERSION (N/A) as a surgical intervention .  The patient's history has been reviewed, patient examined, no change in status, stable for surgery.  I have reviewed the patient's chart and labs.  Questions were answered to the patient's satisfaction.     [name], Reuel Boom

## 2017-02-03 NOTE — Anesthesia Preprocedure Evaluation (Addendum)
Anesthesia Evaluation  Patient identified by MRN, date of birth, ID band Patient awake    Reviewed: Allergy & Precautions, NPO status , Patient's Chart, lab work & pertinent test results  Airway Mallampati: II  TM Distance: >3 FB Neck ROM: Full    Dental  (+) Teeth Intact, Dental Advisory Given, Poor Dentition, Missing   Pulmonary sleep apnea ,    Pulmonary exam normal breath sounds clear to auscultation       Cardiovascular hypertension, Pt. on medications (-) angina+ Past MI and +CHF  + dysrhythmias Atrial Fibrillation  Rhythm:Irregular Rate:Abnormal  Echo 02/02/17: Study Conclusions  - Left ventricle: The cavity size was normal. Wall thickness was normal. Systolic function was severely reduced. The estimated ejection fraction was in the range of 20% to 25%. Severe diffuse hypokinesis with no identifiable regional variations. - Mitral valve: Moderate, holosystolicprolapse, involving the   anterior leaflet. There was moderate to severe regurgitation directed eccentrically and posteriorly. - Left atrium: The atrium was severely dilated. - Right ventricle: Systolic function was moderately reduced. - Right atrium: The atrium was moderately dilated.   Neuro/Psych negative neurological ROS  negative psych ROS   GI/Hepatic negative GI ROS, Neg liver ROS,   Endo/Other  Obesity   Renal/GU Renal InsufficiencyRenal disease     Musculoskeletal negative musculoskeletal ROS (+)   Abdominal   Peds  Hematology  (+) Blood dyscrasia (Thrombocytopenia), anemia ,   Anesthesia Other Findings Day of surgery medications reviewed with the patient.  Reproductive/Obstetrics                            Anesthesia Physical Anesthesia Plan  ASA: IV  Anesthesia Plan: MAC   Post-op Pain Management:    Induction: Intravenous  Airway Management Planned: Nasal Cannula  Additional Equipment:   Intra-op Plan:    Post-operative Plan:   Informed Consent: I have reviewed the patients History and Physical, chart, labs and discussed the procedure including the risks, benefits and alternatives for the proposed anesthesia with the patient or authorized representative who has indicated his/her understanding and acceptance.   Dental advisory given  Plan Discussed with: CRNA and Anesthesiologist  Anesthesia Plan Comments: (Discussed risks/benefits/alternatives to MAC sedation including need for ventilatory support, hypotension, need for conversion to general anesthesia.  All patient questions answered.  Patient/guardian wishes to proceed.)        Anesthesia Quick Evaluation

## 2017-02-03 NOTE — Anesthesia Postprocedure Evaluation (Signed)
Anesthesia Post Note  Patient: Martin [name]  Procedure(s) Performed: Procedure(s) (LRB): TRANSESOPHAGEAL ECHOCARDIOGRAM (TEE) (N/A) CARDIOVERSION (N/A)  Patient location during evaluation: PACU Anesthesia Type: General Level of consciousness: awake and alert Pain management: pain level controlled Vital Signs Assessment: post-procedure vital signs reviewed and stable Respiratory status: spontaneous breathing, nonlabored ventilation, respiratory function stable and patient connected to nasal cannula oxygen Cardiovascular status: blood pressure returned to baseline and stable Postop Assessment: no signs of nausea or vomiting Anesthetic complications: no       Last Vitals:  Vitals:   02/03/17 1140 02/03/17 1150  BP: (!) 117/94 111/86  Pulse: 75 72  Resp: 17 (!) 24  Temp:      Last Pain:  Vitals:   02/03/17 1124  TempSrc: Axillary  PainSc:                  [name]

## 2017-02-03 NOTE — H&P (View-Only) (Signed)
Advanced Heart Failure Rounding Note   Subjective:    Diuresed well overnight. Weight down 8 pounds. Breathing much better. Remains in AF with RVR    Objective:   Weight Range:  Vital Signs:   Temp:  [97.7 F (36.5 C)-98.3 F (36.8 C)] 97.9 F (36.6 C) (02/09 0830) Pulse Rate:  [33-126] 93 (02/09 0830) Resp:  [21-32] 23 (02/09 0830) BP: (100-127)/(63-101) 112/101 (02/09 0830) SpO2:  [96 %-100 %] 99 % (02/09 0300) Weight:  [86 kg (189 lb 9.5 oz)] 86 kg (189 lb 9.5 oz) (02/09 0500) Last BM Date: 02/01/17  Weight change: Filed Weights   02/01/17 0500 02/02/17 0248 02/03/17 0500  Weight: 88.5 kg (195 lb 1.7 oz) 89.7 kg (197 lb 11.2 oz) 86 kg (189 lb 9.5 oz)    Intake/Output:   Intake/Output Summary (Last 24 hours) at 02/03/17 0853 Last data filed at 02/03/17 0600  Gross per 24 hour  Intake          1329.28 ml  Output             3805 ml  Net         -2475.72 ml     Physical Exam: General:  Lying in bed NAD HEENT: normal poor dentition Neck: supple. JVP jaw. Carotids 2+ bilat; no bruits. No lymphadenopathy or thryomegaly appreciated. Cor: PMI nondisplaced. Irregular rate & rhythm. No rubs, gallops or murmurs. Lungs: crackles att bases Abdomen: soft, nontender, distended. No hepatosplenomegaly. No bruits or masses. Good bowel sounds. Extremities: no cyanosis, clubbing, rash,2+  edema Neuro: alert & orientedx3, cranial nerves grossly intact. moves all 4 extremities w/o difficulty. Affect pleasant  Telemetry: AF 130s  Labs: Basic Metabolic Panel:  Recent Labs Lab 02/01/17 0629 02/02/17 0258 02/02/17 0717 02/02/17 1621 02/03/17 0313  NA 138 137 136 135 135  K 4.6 4.1 3.8 4.3 4.3  CL 101 100* 100* 95* 93*  CO2 28 26 24 28 30   GLUCOSE 113* 129* 111* 121* 116*  BUN 40* 32* 29* 28* 29*  CREATININE 1.83* 1.65* 1.54* 1.74* 1.71*  CALCIUM 8.7* 8.6* 8.5* 8.6* 8.7*    Liver Function Tests:  Recent Labs Lab 01/31/17 1900  AST 78*  ALT 122*  ALKPHOS 85   BILITOT 1.8*  PROT 6.7  ALBUMIN 3.4*   No results for input(s): LIPASE, AMYLASE in the last 168 hours. No results for input(s): AMMONIA in the last 168 hours.  CBC:  Recent Labs Lab 01/31/17 1854 01/31/17 1900 02/01/17 0629 02/02/17 0326 02/03/17 0313  WBC  --  7.9 8.8 8.4 8.3  HGB 18.0*  18.0* 16.2 15.4 15.2 16.7  HCT 53.0*  53.0* 48.8 46.3 46.6 50.7  MCV  --  94.8 94.5 93.8 93.4  PLT  --  153 131* 129* 137*    Cardiac Enzymes:  Recent Labs Lab 02/01/17 0629 02/01/17 1228 02/02/17 0258 02/02/17 0717 02/02/17 1357  TROPONINI 0.54* 0.45* 0.33* 0.30* 0.26*    BNP: BNP (last 3 results)  Recent Labs  05/17/16 1250 01/31/17 1900 02/02/17 0258  BNP 905.0* 1,005.0* 820.8*    ProBNP (last 3 results) No results for input(s): PROBNP in the last 8760 hours.    Other results:  Imaging:  No results found.   Medications:     Scheduled Medications: . sodium chloride   Intravenous Once  . aspirin EC  81 mg Oral Daily  . atorvastatin  80 mg Oral q1800  . furosemide  80 mg Intravenous BID  . pneumococcal 23  valent vaccine  0.5 mL Intramuscular Tomorrow-1000  . potassium chloride  40 mEq Oral BID  . sodium chloride flush  3 mL Intravenous Q12H  . sodium chloride flush  3 mL Intravenous Q12H  . sodium chloride flush  3 mL Intravenous Q12H     Infusions: . sodium chloride    . sodium chloride    . amiodarone 30 mg/hr (02/03/17 0504)  . heparin 1,000 Units/hr (02/03/17 0504)     PRN Medications:  sodium chloride, acetaminophen **OR** acetaminophen, acetaminophen, ondansetron **OR** ondansetron (ZOFRAN) IV, sodium chloride flush, sodium chloride flush   Assessment:   1. Acute systolic HF. Echo 02/03/17 EF 20-25%. (down from 40-45% 2. AF with RVR    -This patients CHA2DS2-VASc Score and unadjusted Ischemic Stroke Rate (% per year) is equal to 4.8 % stroke rate/year from a score of 4 3. AKI 4. Presumed CAD  Plan/Discussion:     He remains in  AF with RVR - like previous events, rate not responding to amiodarone and EF getting worse quickly. Will plan TEE and DC-CV later today.  He refuses to take amio long-term or be anticoagulated due to previous complications. This is the 3rd episode he has had in several years and each time he has wound up with severe LV dysfunction and severe HF/shock. Will ask EP to see for possible AVN ablation with CRT. (versus AF ablation)  Continue diuresis and heparin for now. He is willing to use [facility] for several weeks but not long-term at this point due to previous rectus sheath hematoma.   Length of Stay: 3   Bensimhon, Daniel MD 02/03/2017, 8:53 AM  Advanced Heart Failure Team Pager [phone] (M-F; 7a - 4p)  Please contact [facility] for night-coverage after hours (4p -7a ) and weekends on [email or web address]

## 2017-02-03 NOTE — Progress Notes (Signed)
ANTICOAGULATION CONSULT NOTE - Follow Up Consult  Pharmacy Consult for Heparin Indication: atrial fibrillation  Allergies  Allergen Reactions  . Amiodarone Rash    Rash resolved when amio stopped 2013, retried in 2017 with same reaction    Patient Measurements: Height: 5\' 5"  (165.1 cm) Weight: 189 lb 9.5 oz (86 kg) IBW/kg (Calculated) : 61.5 Heparin Dosing Weight: 79.6kg  Vital Signs: Temp: 97.9 F (36.6 C) (02/09 1124) Temp Source: Axillary (02/09 1124) BP: 96/78 (02/09 1300) Pulse Rate: 80 (02/09 1300)  Labs:  Recent Labs  02/01/17 0629  02/02/17 0258 02/02/17 0326 02/02/17 0717 02/02/17 1357 02/02/17 1621 02/03/17 0313 02/03/17 1302  HGB 15.4  --   --  15.2  --   --   --  16.7  --   HCT 46.3  --   --  46.6  --   --   --  50.7  --   PLT 131*  --   --  129*  --   --   --  137*  --   LABPROT  --   --   --   --  15.4*  --   --   --   --   INR  --   --   --   --  1.21  --   --   --   --   HEPARINUNFRC  --   < >  --  0.46  --   --   --  0.90* 0.69  CREATININE 1.83*  --  1.65*  --  1.54*  --  1.74* 1.71*  --   TROPONINI 0.54*  < > 0.33*  --  0.30* 0.26*  --   --   --   < > = values in this interval not displayed.  Estimated Creatinine Clearance: 41.7 mL/min (by C-G formula based on SCr of 1.71 mg/dL (H)).   Medications:  Heparin @ 1000 units/hr  Assessment: 70yom continues on heparin for afib s/p successful DCCV today. Heparin level is therapeutic at 0.69. CBC stable. No bleeding.  Goal of Therapy:  Heparin level 0.3-0.7 units/ml Monitor platelets by anticoagulation protocol: Yes   Plan:  1) Continue heparin at 1000 units/hr 2) Daily heparin level and CBC  Fredrik Rigger 02/03/2017,2:00 PM

## 2017-02-03 NOTE — Progress Notes (Signed)
ANTICOAGULATION CONSULT NOTE - Follow Up Consult  Pharmacy Consult for heparin Indication: atrial fibrillation  Labs:  Recent Labs  02/01/17 0629  02/01/17 1927 02/02/17 0258 02/02/17 0326 02/02/17 0717 02/02/17 1357 02/02/17 1621 02/03/17 0313  HGB 15.4  --   --   --  15.2  --   --   --  16.7  HCT 46.3  --   --   --  46.6  --   --   --  50.7  PLT 131*  --   --   --  129*  --   --   --  137*  LABPROT  --   --   --   --   --  15.4*  --   --   --   INR  --   --   --   --   --  1.21  --   --   --   HEPARINUNFRC  --   --  0.70  --  0.46  --   --   --  0.90*  CREATININE 1.83*  --   --  1.65*  --  1.54*  --  1.74*  --   TROPONINI 0.54*  < >  --  0.33*  --  0.30* 0.26*  --   --   < > = values in this interval not displayed.   Assessment: 70yo male now above goal on heparin after two levels at goal, lab drawn correctly.  Goal of Therapy:  Heparin level 0.3-0.7 units/ml   Plan:  Will decrease heparin gtt by 2-3 units/kg/hr to 1000 units/hr and check level in 8hr.  Vernard Gambles, PharmD, BCPS  02/03/2017,4:48 AM

## 2017-02-04 DIAGNOSIS — I5021 Acute systolic (congestive) heart failure: Secondary | ICD-10-CM

## 2017-02-04 DIAGNOSIS — N170 Acute kidney failure with tubular necrosis: Secondary | ICD-10-CM

## 2017-02-04 LAB — BASIC METABOLIC PANEL
Anion gap: 12 (ref 5–15)
Anion gap: 13 (ref 5–15)
BUN: 26 mg/dL — AB (ref 6–20)
BUN: 24 mg/dL — AB (ref 6–20)
CALCIUM: 8.4 mg/dL — AB (ref 8.9–10.3)
CALCIUM: 8.9 mg/dL (ref 8.9–10.3)
CO2: 31 mmol/L (ref 22–32)
CO2: 29 mmol/L (ref 22–32)
CREATININE: 1.58 mg/dL — AB (ref 0.61–1.24)
CREATININE: 1.86 mg/dL — AB (ref 0.61–1.24)
Chloride: 91 mmol/L — ABNORMAL LOW (ref 101–111)
Chloride: 92 mmol/L — ABNORMAL LOW (ref 101–111)
GFR calc non Af Amer: 43 mL/min — ABNORMAL LOW (ref >60)
GFR calc non Af Amer: 36 mL/min — ABNORMAL LOW (ref >60)
GFR, EST AFRICAN AMERICAN: 50 mL/min — AB (ref >60)
GFR, EST AFRICAN AMERICAN: 41 mL/min — AB (ref >60)
GLUCOSE: 115 mg/dL — AB (ref 65–99)
Glucose, Bld: 109 mg/dL — ABNORMAL HIGH (ref 65–99)
Potassium: 3.6 mmol/L (ref 3.5–5.1)
Potassium: 4.8 mmol/L (ref 3.5–5.1)
Sodium: 134 mmol/L — ABNORMAL LOW (ref 135–145)
Sodium: 134 mmol/L — ABNORMAL LOW (ref 135–145)

## 2017-02-04 LAB — CBC
HEMATOCRIT: 48.5 % (ref 39.0–52.0)
Hemoglobin: 15.9 g/dL (ref 13.0–17.0)
MCH: 30.6 pg (ref 26.0–34.0)
MCHC: 32.8 g/dL (ref 30.0–36.0)
MCV: 93.4 fL (ref 78.0–100.0)
Platelets: 131 10*3/uL — ABNORMAL LOW (ref 150–400)
RBC: 5.19 MIL/uL (ref 4.22–5.81)
RDW: 14.3 % (ref 11.5–15.5)
WBC: 7.9 10*3/uL (ref 4.0–10.5)

## 2017-02-04 LAB — GLUCOSE, CAPILLARY
GLUCOSE-CAPILLARY: 130 mg/dL — AB (ref 65–99)
Glucose-Capillary: 101 mg/dL — ABNORMAL HIGH (ref 65–99)

## 2017-02-04 LAB — HEPARIN LEVEL (UNFRACTIONATED): Heparin Unfractionated: 0.61 IU/mL (ref 0.30–0.70)

## 2017-02-04 MED ORDER — SPIRONOLACTONE 25 MG PO TABS
12.5000 mg | ORAL_TABLET | Freq: Every day | ORAL | Status: DC
  Administered 2017-02-04: 12.5 mg via ORAL
  Filled 2017-02-04: qty 1

## 2017-02-04 NOTE — Progress Notes (Signed)
Advanced Heart Failure Rounding Note   Subjective:    Underwent DC-CV on 2/9. Maintaining NSR on IV amio. Diuresing well. Weight down another 3 pounds. Breathing better. No orthopnea or PND. Remains on heparin.   Seen by EP yesterday.    Objective:   Weight Range:  Vital Signs:   Temp:  [97.2 F (36.2 C)-98.3 F (36.8 C)] 98.3 F (36.8 C) (02/10 1554) Pulse Rate:  [70-82] 77 (02/10 1000) Resp:  [15-26] 22 (02/10 1554) BP: (104-147)/(72-100) 104/72 (02/10 1554) SpO2:  [94 %-98 %] 98 % (02/10 1554) Weight:  [84.7 kg (186 lb 11.2 oz)] 84.7 kg (186 lb 11.2 oz) (02/10 0400) Last BM Date: 02/01/17  Weight change: Filed Weights   02/02/17 0248 02/03/17 0500 02/04/17 0400  Weight: 89.7 kg (197 lb 11.2 oz) 86 kg (189 lb 9.5 oz) 84.7 kg (186 lb 11.2 oz)    Intake/Output:   Intake/Output Summary (Last 24 hours) at 02/04/17 1641 Last data filed at 02/04/17 1600  Gross per 24 hour  Intake            840.7 ml  Output             1725 ml  Net           -884.3 ml     Physical Exam: General:  Lying in bed NAD HEENT: normal poor dentition Neck: supple. JVP 8-9. Carotids 2+ bilat; no bruits. No lymphadenopathy or thryomegaly appreciated. Cor: PMI nondisplaced. Regular rate & rhythm. 2/6 MR. Lungs: clear Abdomen: soft, nontender, distended. No hepatosplenomegaly. No bruits or masses. Good bowel sounds. Extremities: no cyanosis, clubbing, rash,trace edema Neuro: alert & orientedx3, cranial nerves grossly intact. moves all 4 extremities w/o difficulty. Affect pleasant  Telemetry: NSR 70s   Labs: Basic Metabolic Panel:  Recent Labs Lab 02/02/17 0717 02/02/17 1621 02/03/17 0313 02/03/17 1627 02/04/17 0308  NA 136 135 135 136 134*  K 3.8 4.3 4.3 6.0* 3.6  CL 100* 95* 93* 97* 91*  CO2 24 28 30 29 31   GLUCOSE 111* 121* 116* 130* 109*  BUN 29* 28* 29* 26* 26*  CREATININE 1.54* 1.74* 1.71* 1.67* 1.58*  CALCIUM 8.5* 8.6* 8.7* 8.5* 8.4*    Liver Function  Tests:  Recent Labs Lab 01/31/17 1900  AST 78*  ALT 122*  ALKPHOS 85  BILITOT 1.8*  PROT 6.7  ALBUMIN 3.4*   No results for input(s): LIPASE, AMYLASE in the last 168 hours. No results for input(s): AMMONIA in the last 168 hours.  CBC:  Recent Labs Lab 01/31/17 1900 02/01/17 0629 02/02/17 0326 02/03/17 0313 02/04/17 0308  WBC 7.9 8.8 8.4 8.3 7.9  HGB 16.2 15.4 15.2 16.7 15.9  HCT 48.8 46.3 46.6 50.7 48.5  MCV 94.8 94.5 93.8 93.4 93.4  PLT 153 131* 129* 137* 131*    Cardiac Enzymes:  Recent Labs Lab 02/01/17 0629 02/01/17 1228 02/02/17 0258 02/02/17 0717 02/02/17 1357  TROPONINI 0.54* 0.45* 0.33* 0.30* 0.26*    BNP: BNP (last 3 results)  Recent Labs  05/17/16 1250 01/31/17 1900 02/02/17 0258  BNP 905.0* 1,005.0* 820.8*    ProBNP (last 3 results) No results for input(s): PROBNP in the last 8760 hours.    Other results:  Imaging: No results found.   Medications:     Scheduled Medications: . aspirin EC  81 mg Oral Daily  . atorvastatin  80 mg Oral q1800  . furosemide  80 mg Intravenous BID  . pneumococcal 23 valent vaccine  0.5  mL Intramuscular Tomorrow-1000  . potassium chloride  40 mEq Oral BID    Infusions: . amiodarone 30 mg/hr (02/04/17 0700)  . heparin 1,000 Units/hr (02/04/17 0700)    PRN Medications: acetaminophen **OR** acetaminophen, acetaminophen, ondansetron **OR** ondansetron (ZOFRAN) IV   Assessment:   1. Acute systolic HF. Echo 02/03/17 EF 20-25%. (down from 40-45) in setting of recurrent AF with RVR 2. AF with RVR    -This patients CHA2DS2-VASc Score and unadjusted Ischemic Stroke Rate (% per year) is equal to 4.8 % stroke rate/year from a score of 4     --s/p DC-CV on 2/9 3. AKI    -resolved 4. Presumed CAD  Plan/Discussion:    Much improved with DC-CV. Volume status improving. Renal function stable. Tolerating heparin without bleeding.   He says he is now willing to consider taking amio and AC long-term  which he has refused before. However previously amio stopped due to rash. Given LA size, medication noncompliance and fact that this is the 3rd episode he has had in several years and each time he has wound up with severe LV dysfunction and severe HF/shock, I strongly favor AVN ablation with CRT-P. I discussed with Dr. Elberta Fortis personally.  Will pan for procedure early this week and then will switch to Apixaban. Continue IV diuresis one more day then switch to po.   Add low-dose spiro. No bb or ACE/ARB yet.   Length of Stay: 4  Bensimhon, Daniel MD 02/04/2017, 4:41 PM  Advanced Heart Failure Team Pager [phone] (M-F; 7a - 4p)  Please contact [facility] for night-coverage after hours (4p -7a ) and weekends on [email or web address]

## 2017-02-04 NOTE — Progress Notes (Signed)
ANTICOAGULATION CONSULT NOTE - Follow Up Consult  Pharmacy Consult for Heparin Indication: atrial fibrillation  Allergies  Allergen Reactions  . Amiodarone Rash    Rash resolved when amio stopped 2013, retried in 2017 with same reaction    Patient Measurements: Height: 5\' 5"  (165.1 cm) Weight: 186 lb 11.2 oz (84.7 kg) IBW/kg (Calculated) : 61.5 Heparin Dosing Weight: 79.6kg  Vital Signs: Temp: 97.6 F (36.4 C) (02/10 0757) Temp Source: Oral (02/10 0757) BP: 122/85 (02/10 0800) Pulse Rate: 78 (02/10 0800)  Labs:  Recent Labs  02/02/17 0258  02/02/17 0326 02/02/17 0717 02/02/17 1357  02/03/17 0313 02/03/17 1302 02/03/17 1627 02/04/17 0308  HGB  --   < > 15.2  --   --   --  16.7  --   --  15.9  HCT  --   --  46.6  --   --   --  50.7  --   --  48.5  PLT  --   --  129*  --   --   --  137*  --   --  131*  LABPROT  --   --   --  15.4*  --   --   --   --   --   --   INR  --   --   --  1.21  --   --   --   --   --   --   HEPARINUNFRC  --   --  0.46  --   --   --  0.90* 0.69  --  0.61  CREATININE 1.65*  --   --  1.54*  --   < > 1.71*  --  1.67* 1.58*  TROPONINI 0.33*  --   --  0.30* 0.26*  --   --   --   --   --   < > = values in this interval not displayed.  Estimated Creatinine Clearance: 44.8 mL/min (by C-G formula based on SCr of 1.58 mg/dL (H)).   Medications:  Heparin @ 1000 units/hr  Assessment: Martin [name] continues on heparin for afib s/p successful DCCV 2/9. Heparin level is therapeutic at 0.61 CBC stable. No bleeding.  Goal of Therapy:  Heparin level 0.3-0.7 units/ml Monitor platelets by anticoagulation protocol: Yes   Plan:  1) Continue heparin at 1000 units/hr 2) Daily heparin level and CBC  Leota Sauers Pharm.D. CPP, BCPS Clinical Pharmacist [phone] 02/04/2017 8:46 AM

## 2017-02-05 LAB — BASIC METABOLIC PANEL
ANION GAP: 11 (ref 5–15)
Anion gap: 11 (ref 5–15)
BUN: 28 mg/dL — AB (ref 6–20)
BUN: 29 mg/dL — ABNORMAL HIGH (ref 6–20)
CALCIUM: 9.1 mg/dL (ref 8.9–10.3)
CALCIUM: 9 mg/dL (ref 8.9–10.3)
CO2: 32 mmol/L (ref 22–32)
CO2: 26 mmol/L (ref 22–32)
Chloride: 91 mmol/L — ABNORMAL LOW (ref 101–111)
Chloride: 97 mmol/L — ABNORMAL LOW (ref 101–111)
Creatinine, Ser: 1.83 mg/dL — ABNORMAL HIGH (ref 0.61–1.24)
Creatinine, Ser: 1.81 mg/dL — ABNORMAL HIGH (ref 0.61–1.24)
GFR calc Af Amer: 42 mL/min — ABNORMAL LOW (ref >60)
GFR calc Af Amer: 43 mL/min — ABNORMAL LOW (ref >60)
GFR calc non Af Amer: 37 mL/min — ABNORMAL LOW (ref >60)
GFR, EST NON AFRICAN AMERICAN: 36 mL/min — AB (ref >60)
GLUCOSE: 143 mg/dL — AB (ref 65–99)
Glucose, Bld: 106 mg/dL — ABNORMAL HIGH (ref 65–99)
POTASSIUM: 4.6 mmol/L (ref 3.5–5.1)
Potassium: 4.7 mmol/L (ref 3.5–5.1)
Sodium: 134 mmol/L — ABNORMAL LOW (ref 135–145)
Sodium: 134 mmol/L — ABNORMAL LOW (ref 135–145)

## 2017-02-05 LAB — GLUCOSE, CAPILLARY: Glucose-Capillary: 102 mg/dL — ABNORMAL HIGH (ref 65–99)

## 2017-02-05 LAB — CBC
HCT: 50.7 % (ref 39.0–52.0)
Hemoglobin: 17.1 g/dL — ABNORMAL HIGH (ref 13.0–17.0)
MCH: 31.4 pg (ref 26.0–34.0)
MCHC: 33.7 g/dL (ref 30.0–36.0)
MCV: 93.2 fL (ref 78.0–100.0)
PLATELETS: 125 10*3/uL — AB (ref 150–400)
RBC: 5.44 MIL/uL (ref 4.22–5.81)
RDW: 14.3 % (ref 11.5–15.5)
WBC: 9.9 10*3/uL (ref 4.0–10.5)

## 2017-02-05 LAB — HEPARIN LEVEL (UNFRACTIONATED): Heparin Unfractionated: 0.67 IU/mL (ref 0.30–0.70)

## 2017-02-05 NOTE — Progress Notes (Addendum)
Advanced Heart Failure Rounding Note   Subjective:    Underwent DC-CV on 2/9. Maintaining NSR on IV amio.   Lasix stopped this am due to worsening renal function. Weight down 16 pounds with diuresis.   Denies SOB or CP.  No orthopnea or PND. Remains on heparin. Hgb stable. Ambulated room.   Seen by EP yesterday.    Objective:   Weight Range:  Vital Signs:   Temp:  [97.2 F (36.2 C)-98.3 F (36.8 C)] 97.7 F (36.5 C) (02/11 0809) Pulse Rate:  [66-91] 76 (02/11 0809) Resp:  [16-26] 26 (02/11 0809) BP: (87-132)/(56-105) 132/105 (02/11 0809) SpO2:  [94 %-99 %] 97 % (02/11 0809) Weight:  [82.5 kg (181 lb 14.1 oz)] 82.5 kg (181 lb 14.1 oz) (02/11 0417) Last BM Date: 02/01/17  Weight change: Filed Weights   02/03/17 0500 02/04/17 0400 02/05/17 0417  Weight: 86 kg (189 lb 9.5 oz) 84.7 kg (186 lb 11.2 oz) 82.5 kg (181 lb 14.1 oz)    Intake/Output:   Intake/Output Summary (Last 24 hours) at 02/05/17 1022 Last data filed at 02/05/17 0810  Gross per 24 hour  Intake            747.3 ml  Output             3190 ml  Net          -2442.7 ml     Physical Exam: General:  Lying in bed NAD HEENT: normal poor dentition Neck: supple. JVP flat. Carotids 2+ bilat; no bruits. No lymphadenopathy or thryomegaly appreciated. Cor: PMI nondisplaced. Regular rate & rhythm. 2/6 MR. Lungs: clear Abdomen: soft, nontender, distended. No hepatosplenomegaly. No bruits or masses. Good bowel sounds. Extremities: no cyanosis, clubbing, rash, no  edema Neuro: alert & orientedx3, cranial nerves grossly intact. moves all 4 extremities w/o difficulty. Affect pleasant  Telemetry: NSR 70s   Labs: Basic Metabolic Panel:  Recent Labs Lab 02/03/17 0313 02/03/17 1627 02/04/17 0308 02/04/17 1620 02/05/17 0514  NA 135 136 134* 134* 134*  K 4.3 6.0* 3.6 4.8 4.6  CL 93* 97* 91* 92* 91*  CO2 30 29 31 29  32  GLUCOSE 116* 130* 109* 115* 106*  BUN 29* 26* 26* 24* 28*  CREATININE 1.71* 1.67*  1.58* 1.86* 1.83*  CALCIUM 8.7* 8.5* 8.4* 8.9 9.1    Liver Function Tests:  Recent Labs Lab 01/31/17 1900  AST 78*  ALT 122*  ALKPHOS 85  BILITOT 1.8*  PROT 6.7  ALBUMIN 3.4*   No results for input(s): LIPASE, AMYLASE in the last 168 hours. No results for input(s): AMMONIA in the last 168 hours.  CBC:  Recent Labs Lab 02/01/17 0629 02/02/17 0326 02/03/17 0313 02/04/17 0308 02/05/17 0514  WBC 8.8 8.4 8.3 7.9 9.9  HGB 15.4 15.2 16.7 15.9 17.1*  HCT 46.3 46.6 50.7 48.5 50.7  MCV 94.5 93.8 93.4 93.4 93.2  PLT 131* 129* 137* 131* 125*    Cardiac Enzymes:  Recent Labs Lab 02/01/17 0629 02/01/17 1228 02/02/17 0258 02/02/17 0717 02/02/17 1357  TROPONINI 0.54* 0.45* 0.33* 0.30* 0.26*    BNP: BNP (last 3 results)  Recent Labs  05/17/16 1250 01/31/17 1900 02/02/17 0258  BNP 905.0* 1,005.0* 820.8*    ProBNP (last 3 results) No results for input(s): PROBNP in the last 8760 hours.    Other results:  Imaging: No results found.   Medications:     Scheduled Medications: . aspirin EC  81 mg Oral Daily  . atorvastatin  80 mg Oral q1800  . pneumococcal 23 valent vaccine  0.5 mL Intramuscular Tomorrow-1000    Infusions: . amiodarone 30 mg/hr (02/05/17 0515)  . heparin 1,000 Units/hr (02/05/17 0515)    PRN Medications: acetaminophen **OR** acetaminophen, acetaminophen, ondansetron **OR** ondansetron (ZOFRAN) IV   Assessment:   1. Acute systolic HF. Echo 02/03/17 EF 20-25%. (down from 40-45) in setting of recurrent AF with RVR 2. AF with RVR    -This patients CHA2DS2-VASc Score and unadjusted Ischemic Stroke Rate (% per year) is equal to 4.8 % stroke rate/year from a score of 4     --s/p DC-CV on 2/9 3. AKI 4. Presumed CAD  Plan/Discussion:    Much improved with DC-CV. Maintaining NSR on amio   Weight down 16 pounds but creatinine climbing. Will hold diuretics.   Tolerating heparin without bleeding.   CR to mobilize.   He says he is now  willing to consider taking amio and AC long-term which he has refused before. However previously amio stopped due to rash. Given LA size, medication noncompliance and fact that this is the 3rd episode he has had in several years and each time he has wound up with severe LV dysfunction and severe HF/shock, I strongly favor AVN ablation with CRT-P. I discussed with Dr. Elberta Fortis personally.  Will plan for procedure early this week and then will switch to Apixaban for at least 4 weeks (longer if we will agree).   No bb or ACE/ARB yet until more stable and renal function improved.   Length of Stay: 5  Amarilys Lyles MD 02/05/2017, 10:22 AM  Advanced Heart Failure Team Pager [phone] (M-F; 7a - 4p)  Please contact [facility] for night-coverage after hours (4p -7a ) and weekends on [email or web address]

## 2017-02-05 NOTE — Progress Notes (Signed)
ANTICOAGULATION CONSULT NOTE - Follow Up Consult  Pharmacy Consult for Heparin Indication: atrial fibrillation  Allergies  Allergen Reactions  . Amiodarone Rash    Rash resolved when amio stopped 2013, retried in 2017 with same reaction    Patient Measurements: Height: 5\' 5"  (165.1 cm) Weight: 181 lb 14.1 oz (82.5 kg) IBW/kg (Calculated) : 61.5 Heparin Dosing Weight: 79.6kg  Vital Signs: Temp: 97.7 F (36.5 C) (02/11 0809) Temp Source: Oral (02/11 0809) BP: 132/105 (02/11 0809) Pulse Rate: 76 (02/11 0809)  Labs:  Recent Labs  02/02/17 1357  02/03/17 0313 02/03/17 1302  02/04/17 0308 02/04/17 1620 02/05/17 0514  HGB  --   < > 16.7  --   --  15.9  --  17.1*  HCT  --   --  50.7  --   --  48.5  --  50.7  PLT  --   --  137*  --   --  131*  --  125*  HEPARINUNFRC  --   < > 0.90* 0.69  --  0.61  --  0.67  CREATININE  --   < > 1.71*  --   < > 1.58* 1.86* 1.83*  TROPONINI 0.26*  --   --   --   --   --   --   --   < > = values in this interval not displayed.  Estimated Creatinine Clearance: 38.2 mL/min (by C-G formula based on SCr of 1.83 mg/dL (H)).    Assessment: 70yom  on heparin for afib s/p successful DCCV 2/9 - continues on amiodarone drip pending long term plan. Heparin drip 1000 uts/hr  level is therapeutic at 0.67 CBC stable. No bleeding.  Goal of Therapy:  Heparin level 0.3-0.7 units/ml Monitor platelets by anticoagulation protocol: Yes   Plan:  1) Continue heparin at 1000 units/hr 2) Daily heparin level and CBC  Leota Sauers Pharm.D. CPP, BCPS Clinical Pharmacist [phone] 02/05/2017 8:49 AM

## 2017-02-06 ENCOUNTER — Encounter (HOSPITAL_COMMUNITY): Payer: Self-pay | Admitting: Internal Medicine

## 2017-02-06 LAB — BASIC METABOLIC PANEL
Anion gap: 10 (ref 5–15)
Anion gap: 15 (ref 5–15)
BUN: 25 mg/dL — ABNORMAL HIGH (ref 6–20)
BUN: 25 mg/dL — ABNORMAL HIGH (ref 6–20)
CALCIUM: 8.8 mg/dL — AB (ref 8.9–10.3)
CALCIUM: 9.1 mg/dL (ref 8.9–10.3)
CO2: 30 mmol/L (ref 22–32)
CO2: 26 mmol/L (ref 22–32)
CREATININE: 1.76 mg/dL — AB (ref 0.61–1.24)
CREATININE: 1.8 mg/dL — AB (ref 0.61–1.24)
Chloride: 92 mmol/L — ABNORMAL LOW (ref 101–111)
Chloride: 93 mmol/L — ABNORMAL LOW (ref 101–111)
GFR, EST AFRICAN AMERICAN: 44 mL/min — AB (ref >60)
GFR, EST AFRICAN AMERICAN: 43 mL/min — AB (ref >60)
GFR, EST NON AFRICAN AMERICAN: 38 mL/min — AB (ref >60)
GFR, EST NON AFRICAN AMERICAN: 37 mL/min — AB (ref >60)
Glucose, Bld: 104 mg/dL — ABNORMAL HIGH (ref 65–99)
Glucose, Bld: 106 mg/dL — ABNORMAL HIGH (ref 65–99)
Potassium: 4.3 mmol/L (ref 3.5–5.1)
Potassium: 4.5 mmol/L (ref 3.5–5.1)
SODIUM: 132 mmol/L — AB (ref 135–145)
SODIUM: 134 mmol/L — AB (ref 135–145)

## 2017-02-06 LAB — CBC
HCT: 50.2 % (ref 39.0–52.0)
Hemoglobin: 16.7 g/dL (ref 13.0–17.0)
MCH: 30.9 pg (ref 26.0–34.0)
MCHC: 33.3 g/dL (ref 30.0–36.0)
MCV: 92.8 fL (ref 78.0–100.0)
Platelets: 118 10*3/uL — ABNORMAL LOW (ref 150–400)
RBC: 5.41 MIL/uL (ref 4.22–5.81)
RDW: 14.2 % (ref 11.5–15.5)
WBC: 8.5 10*3/uL (ref 4.0–10.5)

## 2017-02-06 LAB — HEPARIN LEVEL (UNFRACTIONATED): HEPARIN UNFRACTIONATED: 0.53 [IU]/mL (ref 0.30–0.70)

## 2017-02-06 LAB — GLUCOSE, CAPILLARY
GLUCOSE-CAPILLARY: 100 mg/dL — AB (ref 65–99)
GLUCOSE-CAPILLARY: 87 mg/dL (ref 65–99)

## 2017-02-06 MED ORDER — SODIUM CHLORIDE 0.9 % IV SOLN: INTRAVENOUS | Status: DC

## 2017-02-06 MED ORDER — APIXABAN 5 MG PO TABS
5.0000 mg | ORAL_TABLET | Freq: Two times a day (BID) | ORAL | Status: DC
  Administered 2017-02-06 – 2017-02-07 (×3): 5 mg via ORAL
  Filled 2017-02-06 (×3): qty 1

## 2017-02-06 NOTE — Progress Notes (Signed)
SUBJECTIVE: The patient is improved today. No shortness of breath or chest pain. Maintaining SR   CURRENT MEDICATIONS: . aspirin EC  81 mg Oral Daily  . atorvastatin  80 mg Oral q1800  . pneumococcal 23 valent vaccine  0.5 mL Intramuscular Tomorrow-1000   . amiodarone 30 mg/hr (02/06/17 0401)  . heparin 1,000 Units/hr (02/06/17 0401)    OBJECTIVE: Physical Exam: Vitals:   02/05/17 2300 02/06/17 0000 02/06/17 0516 02/06/17 0700  BP: 122/77 116/76 109/73 113/85  Pulse: (!) 111 67 76 73  Resp: 16 20 17 20   Temp:  98.4 F (36.9 C) 99.1 F (37.3 C) 99.1 F (37.3 C)  TempSrc:  Oral Oral Oral  SpO2: (!) 81% 98% 99% 97%  Weight:      Height:        Intake/Output Summary (Last 24 hours) at 02/06/17 0804 Last data filed at 02/06/17 0700  Gross per 24 hour  Intake              774 ml  Output             1230 ml  Net             -456 ml    Telemetry reveals sinus rhythm, short runs AF  GEN- The patient is well appearing, alert and oriented x 3 today.   Head- normocephalic, atraumatic Eyes-  Sclera clear, conjunctiva pink Ears- hearing intact Oropharynx- clear Neck- supple  Lungs- Clear to ausculation bilaterally, normal work of breathing Heart- Regular rate and rhythm  GI- soft, NT, ND, + BS Extremities- no clubbing, cyanosis, +compression hose  Skin- no rash or lesion Psych- euthymic mood, full affect Neuro- strength and sensation are intact  LABS: Basic Metabolic Panel:  Recent Labs  96/04/54 1839 02/06/17 0508  NA 134* 132*  K 4.7 4.3  CL 97* 92*  CO2 26 30  GLUCOSE 143* 104*  BUN 29* 25*  CREATININE 1.81* 1.76*  CALCIUM 9.0 8.8*   CBC:  Recent Labs  02/05/17 0514 02/06/17 0508  WBC 9.9 8.5  HGB 17.1* 16.7  HCT 50.7 50.2  MCV 93.2 92.8  PLT 125* 118*    RADIOLOGY: Dg Chest Portable 1 View Result Date: 01/31/2017 CLINICAL DATA:  Shortness of breath for several weeks EXAM: PORTABLE CHEST 1 VIEW COMPARISON:  05/18/2016 FINDINGS: Cardiac  shadow is enlarged. The lungs are well aerated bilaterally. There is improved aeration in the right lung base when compared with the prior exam. No acute bony abnormality is seen. IMPRESSION: No active disease. Electronically Signed   By: Alcide Clever M.D.   On: 01/31/2017 18:51    ASSESSMENT AND PLAN:  Active Problems:   CKD (chronic kidney disease) stage 3, GFR 30-59 ml/min   Atrial fibrillation with RVR (HCC)   Acute on chronic diastolic heart failure (HCC)   Acute on chronic renal failure (HCC)   A-fib (HCC)   CHF (congestive heart failure) (HCC)   1.  AF with RVR Maintaining SR on IV amiodarone CHADS2VASC is 3. Currently on IV Heparin Options are to continue amiodarone for now vs AVN ablation/CRTP With issues with compliance, I worry about AVN ablation/CRT implant.  Discussed with patient today.  He would like to continue amiodarone for now which is reasonable Ayris Carano discuss with AHF team - now that he is in SR, could bring back as outpatient for AVN ablation/CRT to better evaluate   2. Acute on chronic systolic heart failure Improved in SR Management per  AHF team  3.  AKI Resolved   Dr Elberta Fortis to discuss with Dr Gala Romney today. If plan is CRT/AVN ablation this admission, would recommend switching IV heparin to NOAC prior to implant.  Schedule Virna Livengood allow for device implant tomorrow.   Gypsy Balsam, NP 02/06/2017 8:09 AM  I have seen and examined this patient with Gypsy Balsam.  Agree with above, note added to reflect my findings.  On exam, regular rhythm, no murmurs, lungs clear. Continued to be in sinus rhythm today on amiodarone and anticoagulated with heparin. I did discuss with him the possibility of AV nodal ablation and pacemaker placement. She understands. Tentatively plan for that tomorrow, as he does have a history of medication noncompliance and multiple episodes of A. fib with heart failure, with an improvement in his ejection fraction when he is not overly tachycardic.  That being said, if the patient does not feel like this is an appropriate management decision, Jakeira Seeman plan for follow-up in clinic.    Kord Monette M. Tina Gruner MD 02/06/2017 9:25 AM

## 2017-02-06 NOTE — Progress Notes (Signed)
ANTICOAGULATION CONSULT NOTE - Follow Up Consult  Pharmacy Consult for Heparin Indication: atrial fibrillation  Allergies  Allergen Reactions  . Amiodarone Rash    Rash resolved when amio stopped 2013, retried in 2017 with same reaction    Patient Measurements: Height: 5\' 5"  (165.1 cm) Weight: 181 lb 14.1 oz (82.5 kg) IBW/kg (Calculated) : 61.5 Heparin Dosing Weight: 79.6kg  Vital Signs: Temp: 99.1 F (37.3 C) (02/12 0700) Temp Source: Oral (02/12 0700) BP: 113/85 (02/12 0700) Pulse Rate: 73 (02/12 0700)  Labs:  Recent Labs  02/04/17 0308  02/05/17 0514 02/05/17 1839 02/06/17 0508  HGB 15.9  --  17.1*  --  16.7  HCT 48.5  --  50.7  --  50.2  PLT 131*  --  125*  --  118*  HEPARINUNFRC 0.61  --  0.67  --  0.53  CREATININE 1.58*  < > 1.83* 1.81* 1.76*  < > = values in this interval not displayed.  Estimated Creatinine Clearance: 39.7 mL/min (by C-G formula based on SCr of 1.76 mg/dL (H)).    Assessment: 70yom  on heparin for afib s/p successful DCCV 2/9 - continues on amiodarone drip pending long term plan. He is having a rash which is consistent with his history when placed on amiodarone. Will follow closely if he can tolerate until AVN ablation/CRT-P.  Patient currently anticoagulated with Heparin drip at 1000 units/hr, his level is therapeutic at 0.5,  CBC stable. No bleeding. Plan is for eventual apixaban.   Goal of Therapy:  Heparin level 0.3-0.7 units/ml Monitor platelets by anticoagulation protocol: Yes   Plan:  1) Continue heparin at 1000 units/hr 2) Daily heparin level and CBC  Sheppard Coil PharmD., BCPS Clinical Pharmacist Pager [phone] 02/06/2017 7:52 AM

## 2017-02-06 NOTE — Progress Notes (Signed)
CARDIAC REHAB PHASE I   PRE:  Rate/Rhythm: 74 SR  BP:  Sitting: 132/84        SaO2: 98 RA  MODE:  Ambulation: 780 ft   POST:  Rate/Rhythm: 101 ST  BP:  Sitting: 132/91         SaO2: 100 RA  Pt ambulated 780 ft on RA, IV, assist x1, steady gait, tolerated well with no complaints. Completed CHF education with pt and wife at bedside. Reviewed CHF booklet and zone tool, sodium and fluid restrictions, heart healthy diet, daily weights, exercise guidelines and phase 2 cardiac rehab. Pt and wife verbalized understanding, able to perform teach back. Pt states he is not interested in phase 2 cardiac rehab at this time. Pt to recliner after walk, feet elevated, call bell within reach. Will follow.   [phone] Joylene Grapes, RN, BSN 02/06/2017 9:46 AM

## 2017-02-06 NOTE — Progress Notes (Signed)
Attempted to obtain consent for pacemaker placement tomorrow, pt stated he wanted more time to think about it, unsigned consent placed on paper chart.

## 2017-02-06 NOTE — Progress Notes (Signed)
Advanced Heart Failure Rounding Note   Subjective:    Underwent DC-CV on 2/9. Maintaining NSR on IV amio.   Lasix stopped 02/05/17. Weight down at least 16 pounds with diuresis. No weight this am.   Feeling OK overall. Having rash on back.  Itchy, but otherwise asymptomatic. Remains in NSR. Ambulating halls without difficulty.   EP following. Plan for AV node ablation and CRT-P tomorrow.   Objective:   Weight Range:  Vital Signs:   Temp:  [97.4 F (36.3 C)-99.1 F (37.3 C)] 99.1 F (37.3 C) (02/12 0516) Pulse Rate:  [67-111] 76 (02/12 0516) Resp:  [16-26] 17 (02/12 0516) BP: (96-144)/(59-105) 109/73 (02/12 0516) SpO2:  [81 %-100 %] 99 % (02/12 0516) Last BM Date: 02/05/17  Weight change: Filed Weights   02/03/17 0500 02/04/17 0400 02/05/17 0417  Weight: 189 lb 9.5 oz (86 kg) 186 lb 11.2 oz (84.7 kg) 181 lb 14.1 oz (82.5 kg)    Intake/Output:   Intake/Output Summary (Last 24 hours) at 02/06/17 0743 Last data filed at 02/06/17 0500  Gross per 24 hour  Intake            880.8 ml  Output             1080 ml  Net           -199.2 ml     Physical Exam: General:  Sitting on edge of bed, NAD  HEENT: Normal, except for poor dentition.  Neck: supple. JVP flat. Carotids 2+ bilat; no bruits. No thyromegaly or nodule noted.  Cor: PMI nondisplaced. RRR. 2/6 MR. Lungs: CTAB, normal effort Abdomen: soft, NT, ND, no HSM. No bruits or masses. +BS  Extremities: no cyanosis, clubbing, rash, no edema.  Neuro: alert & orientedx3, cranial nerves grossly intact. moves all 4 extremities w/o difficulty. Affect pleasant Skin: Red, maculopapular rash  Diffusely on back.   Telemetry: Reviewed, NSR 70-80s  Labs: Basic Metabolic Panel:  Recent Labs Lab 02/04/17 0308 02/04/17 1620 02/05/17 0514 02/05/17 1839 02/06/17 0508  NA 134* 134* 134* 134* 132*  K 3.6 4.8 4.6 4.7 4.3  CL 91* 92* 91* 97* 92*  CO2 31 29 32 26 30  GLUCOSE 109* 115* 106* 143* 104*  BUN 26* 24* 28* 29*  25*  CREATININE 1.58* 1.86* 1.83* 1.81* 1.76*  CALCIUM 8.4* 8.9 9.1 9.0 8.8*    Liver Function Tests:  Recent Labs Lab 01/31/17 1900  AST 78*  ALT 122*  ALKPHOS 85  BILITOT 1.8*  PROT 6.7  ALBUMIN 3.4*   No results for input(s): LIPASE, AMYLASE in the last 168 hours. No results for input(s): AMMONIA in the last 168 hours.  CBC:  Recent Labs Lab 02/02/17 0326 02/03/17 0313 02/04/17 0308 02/05/17 0514 02/06/17 0508  WBC 8.4 8.3 7.9 9.9 8.5  HGB 15.2 16.7 15.9 17.1* 16.7  HCT 46.6 50.7 48.5 50.7 50.2  MCV 93.8 93.4 93.4 93.2 92.8  PLT 129* 137* 131* 125* 118*    Cardiac Enzymes:  Recent Labs Lab 02/01/17 0629 02/01/17 1228 02/02/17 0258 02/02/17 0717 02/02/17 1357  TROPONINI 0.54* 0.45* 0.33* 0.30* 0.26*    BNP: BNP (last 3 results)  Recent Labs  05/17/16 1250 01/31/17 1900 02/02/17 0258  BNP 905.0* 1,005.0* 820.8*    ProBNP (last 3 results) No results for input(s): PROBNP in the last 8760 hours.    Other results:  Imaging: No results found.   Medications:     Scheduled Medications: . aspirin EC  81  mg Oral Daily  . atorvastatin  80 mg Oral q1800  . pneumococcal 23 valent vaccine  0.5 mL Intramuscular Tomorrow-1000    Infusions: . amiodarone 30 mg/hr (02/06/17 0401)  . heparin 1,000 Units/hr (02/06/17 0401)    PRN Medications: acetaminophen **OR** acetaminophen, acetaminophen, ondansetron **OR** ondansetron (ZOFRAN) IV   Assessment:   1. Acute systolic HF. Echo 02/03/17 EF 20-25%. (down from 40-45) in setting of recurrent AF with RVR 2. AF with RVR    -This patients CHA2DS2-VASc Score and unadjusted Ischemic Stroke Rate (% per year) is equal to 4.8 % stroke rate/year from a score of 4     --s/p DC-CV on 2/9 3. AKI 4. Presumed CAD  Plan/Discussion:    Much improved with DC-CV. Maintaining NSR on amio. Plan for AV node ablation and CRT-P tomorrow per EP.   Despite mild rash, will continue amiodarone today with very poor  tolerance of Afib. Will plan to discontinue with ablation tomorrow.   Weight down 16 pounds. Creatinine stable to improved with holding diuretics. Will leave off lasix for one more day and resume home dose lasix    Stop heparin. Start Eliquis 5 mg BID.   CR to mobilize.   No bb or ACE/ARB until more stable and creatinine improved.    Length of Stay: [address]  Luane School  02/06/2017, 7:43 AM  Advanced Heart Failure Team Pager [phone] (M-F; 7a - 4p)  Please contact [facility] for night-coverage after hours (4p -7a ) and weekends on [email or web address]  Patient seen and examined with Otilio Saber, PA-C. We discussed all aspects of the encounter. I agree with the assessment and plan as stated above.   Overall improved. Volume status looks good. Renal function improving. Hold diuretics one more day.  Maintaining NSR on amio however now has developed recurrent rash which is likely do to amio. Will have to stop. Will switch heparin to Eliquis.    Again, had long talk with him and his wife about options. Given inability to use amio and high risk of recurrence of AF and shock, I think only option is AVN ablation and CRT-P. They agree to proceed but continue to ask for other options. I d/w Dr. Elberta Fortis in EP personally.   Macario Shear,MD 5:09 PM

## 2017-02-06 NOTE — Progress Notes (Signed)
Upon shift assessment red rash noted to patients back. Patient denies any itching. RN offered to put lotion on it and he refused. Will continue to monitor and let MD know in am.

## 2017-02-07 ENCOUNTER — Encounter (HOSPITAL_COMMUNITY)
Admission: EM | Disposition: A | Discharge status: Home / Self Care | Payer: Self-pay | Source: Home / Self Care | Attending: Cardiovascular Disease

## 2017-02-07 LAB — CBC
HCT: 46.5 % (ref 39.0–52.0)
Hemoglobin: 15.5 g/dL (ref 13.0–17.0)
MCH: 30.7 pg (ref 26.0–34.0)
MCHC: 33.3 g/dL (ref 30.0–36.0)
MCV: 92.1 fL (ref 78.0–100.0)
PLATELETS: 132 10*3/uL — AB (ref 150–400)
RBC: 5.05 MIL/uL (ref 4.22–5.81)
RDW: 14 % (ref 11.5–15.5)
WBC: 7.5 10*3/uL (ref 4.0–10.5)

## 2017-02-07 LAB — GLUCOSE, CAPILLARY: Glucose-Capillary: 111 mg/dL — ABNORMAL HIGH (ref 65–99)

## 2017-02-07 SURGERY — BIV PACEMAKER INSERTION CRT-P

## 2017-02-07 MED ORDER — LOSARTAN POTASSIUM 25 MG PO TABS: 12.5000 mg | ORAL_TABLET | Freq: Every day | ORAL | Status: DC

## 2017-02-07 MED ORDER — FUROSEMIDE 40 MG PO TABS
40.0000 mg | ORAL_TABLET | Freq: Every day | ORAL | Status: DC
  Administered 2017-02-07: 40 mg via ORAL
  Filled 2017-02-07: qty 1

## 2017-02-07 MED ORDER — FUROSEMIDE 40 MG PO TABS: 40.0000 mg | ORAL_TABLET | Freq: Every day | ORAL | 6 refills | Status: DC

## 2017-02-07 MED ORDER — LOSARTAN POTASSIUM 25 MG PO TABS: 12.5000 mg | ORAL_TABLET | Freq: Every day | ORAL | 6 refills | Status: DC

## 2017-02-07 MED ORDER — APIXABAN 5 MG PO TABS: 5.0000 mg | ORAL_TABLET | Freq: Two times a day (BID) | ORAL | 5 refills | Status: DC

## 2017-02-07 NOTE — Progress Notes (Signed)
CARDIAC REHAB PHASE I   PRE:  Rate/Rhythm: 73 SR  BP:  Supine:   Sitting: 131/85  Standing:    SaO2: 98%RA  MODE:  Ambulation: 470 ft   POST:  Rate/Rhythm: 90 SR  BP:  Supine:   Sitting: 129/89  Standing:    SaO2: 97%RA 1115-1135 Pt walked 470 ft on RA with steady gait and tolerated well. Would have gone farther but he c/o bottom of his feet hurting during walk.    Luetta Nutting, RN BSN  02/07/2017 11:32 AM

## 2017-02-07 NOTE — Progress Notes (Signed)
Advanced Heart Failure Rounding Note   Subjective:    Underwent DC-CV on 2/9. Maintaining NSR on IV amio.   Lasix stopped 02/05/17. Weight down at least 16 pounds with diuresis. No weight this am.   Feeling OK this am.  Now refusing AV node ablation and CRT-P.  States he has a bad feeling and wants a "second opinion".  Understands that he doesn't tolerate amiodarone and their may not be any other options apart from that for him.   Creatinine 1.8  Objective:   Weight Range:  Vital Signs:   Temp:  [97.3 F (36.3 C)-99 F (37.2 C)] 97.3 F (36.3 C) (02/13 0724) Pulse Rate:  [65-128] 69 (02/12 2343) Resp:  [17-25] 17 (02/13 0724) BP: (107-136)/(72-102) 126/72 (02/13 0724) SpO2:  [95 %-100 %] 100 % (02/13 0724) Weight:  [183 lb 1.6 oz (83.1 kg)] 183 lb 1.6 oz (83.1 kg) (02/13 0500) Last BM Date: 02/05/17  Weight change: Filed Weights   02/04/17 0400 02/05/17 0417 02/07/17 0500  Weight: 186 lb 11.2 oz (84.7 kg) 181 lb 14.1 oz (82.5 kg) 183 lb 1.6 oz (83.1 kg)    Intake/Output:   Intake/Output Summary (Last 24 hours) at 02/07/17 0816 Last data filed at 02/07/17 0600  Gross per 24 hour  Intake           367.41 ml  Output             1225 ml  Net          -857.59 ml     Physical Exam: General:  Sitting on edge of bed, NAD  HEENT: Normal, except for poor dentition.  Neck: supple. JVP 7-8 cm. Carotids 2+ bilat; no bruits. No thyromegaly or nodule noted.  Cor: PMI nondisplaced. RRR. 2/6 MR. Lungs: Clear, normal effort.  Abdomen: soft, NT, ND, no HSM. No bruits or masses. +BS  Extremities: No cyanosis, clubbing, rash, no edema. Neuro: alert & orientedx3, cranial nerves grossly intact. moves all 4 extremities w/o difficulty. Affect pleasant Skin: Red, maculopapular rash  Diffusely on back. Unresolved.  Telemetry: Reviewed, NSR 80s   Labs: Basic Metabolic Panel:  Recent Labs Lab 02/04/17 1620 02/05/17 0514 02/05/17 1839 02/06/17 0508 02/06/17 1641  NA 134*  134* 134* 132* 134*  K 4.8 4.6 4.7 4.3 4.5  CL 92* 91* 97* 92* 93*  CO2 29 32 26 30 26   GLUCOSE 115* 106* 143* 104* 106*  BUN 24* 28* 29* 25* 25*  CREATININE 1.86* 1.83* 1.81* 1.76* 1.80*  CALCIUM 8.9 9.1 9.0 8.8* 9.1    Liver Function Tests:  Recent Labs Lab 01/31/17 1900  AST 78*  ALT 122*  ALKPHOS 85  BILITOT 1.8*  PROT 6.7  ALBUMIN 3.4*   No results for input(s): LIPASE, AMYLASE in the last 168 hours. No results for input(s): AMMONIA in the last 168 hours.  CBC:  Recent Labs Lab 02/03/17 0313 02/04/17 0308 02/05/17 0514 02/06/17 0508 02/07/17 0229  WBC 8.3 7.9 9.9 8.5 7.5  HGB 16.7 15.9 17.1* 16.7 15.5  HCT 50.7 48.5 50.7 50.2 46.5  MCV 93.4 93.4 93.2 92.8 92.1  PLT 137* 131* 125* 118* 132*    Cardiac Enzymes:  Recent Labs Lab 02/01/17 0629 02/01/17 1228 02/02/17 0258 02/02/17 0717 02/02/17 1357  TROPONINI 0.54* 0.45* 0.33* 0.30* 0.26*    BNP: BNP (last 3 results)  Recent Labs  05/17/16 1250 01/31/17 1900 02/02/17 0258  BNP 905.0* 1,005.0* 820.8*    ProBNP (last 3 results) No results  for input(s): PROBNP in the last 8760 hours.    Other results:  Imaging: No results found.   Medications:     Scheduled Medications: . apixaban  5 mg Oral BID  . aspirin EC  81 mg Oral Daily  . atorvastatin  80 mg Oral q1800  . pneumococcal 23 valent vaccine  0.5 mL Intramuscular Tomorrow-1000    Infusions: . sodium chloride Stopped (02/07/17 0541)  . amiodarone 30 mg/hr (02/06/17 2300)    PRN Medications: acetaminophen **OR** acetaminophen, ondansetron **OR** ondansetron (ZOFRAN) IV   Assessment:   1. Acute systolic HF. Echo 02/03/17 EF 20-25%. (down from 40-45) in setting of recurrent AF with RVR 2. AF with RVR    -This patients CHA2DS2-VASc Score and unadjusted Ischemic Stroke Rate (% per year) is equal to 4.8 % stroke rate/year from a score of 4     --s/p DC-CV on 2/9 3. AKI 4. Presumed CAD  Plan/Discussion:    Much improved  with DC-CV. Maintaining NSR on amio.   Had initially planned for AV node ablation and CRT-P today, but pt now refusing.  Will discuss further with MD.  Will need to stop amiodarone with rash, and suspect very high change of afib re-occurrence.   Weight down 14 pounds. Creatinine stable. Resume home dose lasix.     Continue Eliquis 5 mg BID.   CR to mobilize.   No bb or ACE/ARB until more stable and creatinine improved.    Likely home today as he is refusing any further procedure.  Not candidate for po amio with rash.   Length of Stay: [address]  Graciella Freer, [place]  02/07/2017, 8:16 AM  Advanced Heart Failure Team Pager [phone] (M-F; 7a - 4p)  Please contact [facility] for night-coverage after hours (4p -7a ) and weekends on [email or web address]  Patient seen and examined with Otilio Saber, PA-C. We discussed all aspects of the encounter. I agree with the assessment and plan as stated above.   Despite multiple discussions regarding the need for definitive therapy to prevent recurrence of AF and resultant cardiogenic shock, he is now refusing AVN ablation and CRT. He is unable to take amiodarone due to rash. I stressed to him and his wife over and over that risk for recurrence and severe decompensation (and possibly renal failure/death) are extremely high but he remains steadfast in his decision. Can go home today on current meds including apixaban. He wants to f/u at the [facility] for a second opinion. He has historically been very noncompliant in following recommendations and with f/u.   Would start losartan 12.5 mg at night. Check BMET 1 week. Hold off on carvedilol for now until EF more stable. Not candidate for amio or Tikosyn.   Bensimhon, Daniel,MD 10:29 AM

## 2017-02-07 NOTE — Care Management Note (Addendum)
Case Management Note  Patient Details  Name: Martin [name] MRN: [id number] Date of Birth: 06-Feb-1946  Subjective/Objective:        Adm w chf, at fib            Action/Plan: to return home w wife  Expected Discharge Date:                  Expected Discharge Plan:  Home w Home Health Services  In-House Referral:     Discharge planning Services  CM Consult, Medication Assistance  Post Acute Care Choice:    Choice offered to:     DME Arranged:    DME Agency:     HH Arranged:    HH Agency:     Status of Service:  In process, will continue to follow  If discussed at Long Length of Stay Meetings, dates discussed:    Additional Comments:spoke w pt and wife. They do not have medicare d plan for meds. Gave pt 30day free eliquis card. Pt assist form gave to md to sign then will give to pt to mail in. md filled out pt assist form and fam filled out rest of form. Faxed in to [employer] squibb but also gave original forms to wife to mail in w proof of income.  [name], RN 02/07/2017, 10:21 AM

## 2017-02-07 NOTE — Discharge Summary (Signed)
Advanced Heart Failure Discharge Note  Discharge Summary   Patient ID: Martin [name] MRN: [id number], DOB/AGE: 1946/01/21 71 y.o. Admit date: 01/31/2017 D/C date:     02/07/2017   Primary Discharge Diagnoses:  1. Acute systolic HF: Echo 02/03/17 LVEF 40-98% down from 40-45% in setting of recurrent AF with RVR 2. AF with RVR - CHA2DS2-VASc Score 4 - s/p DC-CV on 2/9 3. AKI 4. Presumed CAD  Hospital Course:   Martin [name] is a 71 y.o. 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   Presented to [facility] 01/31/17 with malaise and weakness. Found to be in Afib RVR and with volume overload. Started on IV amiodarone with no real improvement in rate.  Also non responsive to IV diltiazem. Pt has refused long term amiodarone due to rash.   Pt diuresed and then underwent successful DC-CV on 02/03/17 and maintained NSR on IV amiodarone. Discussed with EP and considered for AVN ablation with CRT-P due to recurrent AF, each time leading to cardiogenic shock.   Diuretics held 02/05/17 with mild rise in creatinine. Weight down at least 16 lbs this admission. Discharge weight 183 lbs. Restarted on po meds as creatinine stabilized.   Despite multiple discussions regarding need for definitive therapy to prevent recurrence of AF, pt refused AVN ablation and CRT-P, after initially considering.  Stressed complications of recurrence including further decompensation, renal failure, and up to death.  He continues to refuse and request to seek a second opinion at the [place] office.    Pt will be discharged to home in stable condition with close follow up as below.  Pt knows to call HF clinic sooner with any recurrent or worsening symptoms.  Stressed importance of compliance with medication and follow up repeatedly.  Pt assistance form filled out and faxed with assistance of case management for patients Eliquis.     Discharge Weight Range: 183 lbs Discharge Vitals: Blood pressure 93/65, pulse 69, temperature 97.2 F (36.2 C), temperature source Oral, resp. rate (!) 22, height 5\' 5"  (1.651 m), weight 183 lb 1.6 oz (83.1 kg), SpO2 100 %.  Labs: Lab Results  Component Value Date   WBC 7.5 02/07/2017   HGB 15.5 02/07/2017   HCT 46.5 02/07/2017   MCV 92.1 02/07/2017   PLT 132 (L) 02/07/2017    Recent Labs Lab 01/31/17 1900  02/06/17 1641  NA 140  < > 134*  K 4.3  < > 4.5  CL 103  < > 93*  CO2 26  < > 26  BUN 42*  < > 25*  CREATININE 1.98*  < > 1.80*  CALCIUM 9.2  < > 9.1  PROT 6.7  --   --   BILITOT 1.8*  --   --   ALKPHOS 85  --   --   ALT 122*  --   --   AST 78*  --   --   GLUCOSE 154*  < > 106*  < > = values in this interval not displayed. No results found for: CHOL, HDL, LDLCALC, TRIG BNP (last 3 results)  Recent Labs  05/17/16 1250 01/31/17 1900 02/02/17 0258  BNP 905.0* 1,005.0* 820.8*    ProBNP (last 3 results) No results for input(s): PROBNP in the last 8760 hours.   Diagnostic Studies/Procedures   No results found.  Discharge Medications   Allergies as of 02/07/2017  Reactions   Amiodarone Rash   Rash resolved when amio stopped 2013, retried in 2017 with same reaction      Medication List    STOP taking these medications   carvedilol 3.125 MG tablet Commonly known as:  COREG     TAKE these medications   apixaban 5 MG Tabs tablet Commonly known as:  ELIQUIS Take 1 tablet (5 mg total) by mouth 2 (two) times daily.   atorvastatin 80 MG tablet Commonly known as:  LIPITOR Take 1 tablet (80 mg total) by mouth daily at 6 PM.   furosemide 40 MG tablet Commonly known as:  LASIX Take 1 tablet (40 mg total) by mouth daily. Take an extra 40mg  (1 tablet) for weight 190lbs or greater   losartan 25 MG tablet Commonly known as:  COZAAR Take 0.5 tablets (12.5 mg total) by mouth at bedtime.   multivitamin with minerals Tabs tablet Take 1 tablet by mouth  daily.       Disposition   The patient will be discharged in stable condition to home. Discharge Instructions    (HEART FAILURE PATIENTS) Call MD:  Anytime you have any of the following symptoms: 1) 3 pound weight gain in 24 hours or 5 pounds in 1 week 2) shortness of breath, with or without a dry hacking cough 3) swelling in the hands, feet or stomach 4) if you have to sleep on extra pillows at night in order to breathe.    Complete by:  As directed    Call MD for:  persistant dizziness or light-headedness    Complete by:  As directed    Diet - low sodium heart healthy    Complete by:  As directed    Heart Failure patients record your daily weight using the same scale at the same time of day    Complete by:  As directed    Increase activity slowly    Complete by:  As directed    STOP any activity that causes chest pain, shortness of breath, dizziness, sweating, or exessive weakness    Complete by:  As directed      Follow-up Information    [name], NP Follow up on 02/22/2017.   Specialty:  Cardiology Why:  at 1200 pm for post hospital follow up. Please bring all of your medications to your visit. The code for parking is 5001. Contact information: [address] [place] [address] [phone]        [facility] Follow up on 02/14/2017.   Specialty:  Cardiology Why:  at 2 pm for post hospital LABS. (BMET/CBC).  Parking code is 5001. Contact information: [address] [id number] mc [place] [address] [phone]            Duration of Discharge Encounter: Greater than 35 minutes   Signed, Luane School 02/07/2017, 1:07 PM  Patient seen and examined with Otilio Saber, PA-C. We discussed all aspects of the encounter. I agree with the assessment and plan as stated above.   He is improved with DC-CV. Despite numerous discussion he continues to refuse definitve therapy for his AF.  He is aware of risk of recurrence resulting in possible severe decompensation and death. He again developed a rash with amiodarone and is not a candidate for long-term amio therapy.   Artasia Thang,MD 12:09 AM

## 2017-02-07 NOTE — Discharge Instructions (Signed)

## 2017-02-07 NOTE — Progress Notes (Signed)
Late note from 02/06/17 @1945 : pt noted with raised, bruised area to lt forearm. When asked about etiology, he stated ," I don't know, I think its from being stuck". Area tender and warm to touch, ice pack applied for relief.

## 2017-02-14 ENCOUNTER — Ambulatory Visit (HOSPITAL_COMMUNITY)
Admission: RE | Admit: 2017-02-14 | Discharge: 2017-02-14 | Disposition: A | Discharge status: Home / Self Care | Payer: Medicare Other | Source: Ambulatory Visit | Attending: Cardiology | Admitting: Cardiology

## 2017-02-14 ENCOUNTER — Encounter (HOSPITAL_COMMUNITY): Payer: Medicare Other

## 2017-02-14 ENCOUNTER — Other Ambulatory Visit (HOSPITAL_COMMUNITY): Payer: Self-pay | Admitting: *Deleted

## 2017-02-14 DIAGNOSIS — I5022 Chronic systolic (congestive) heart failure: Secondary | ICD-10-CM

## 2017-02-14 LAB — BASIC METABOLIC PANEL
Anion gap: 12 (ref 5–15)
BUN: 20 mg/dL (ref 6–20)
CO2: 26 mmol/L (ref 22–32)
CREATININE: 1.34 mg/dL — AB (ref 0.61–1.24)
Calcium: 9.4 mg/dL (ref 8.9–10.3)
Chloride: 103 mmol/L (ref 101–111)
GFR calc non Af Amer: 53 mL/min — ABNORMAL LOW (ref >60)
Glucose, Bld: 86 mg/dL (ref 65–99)
POTASSIUM: 4.5 mmol/L (ref 3.5–5.1)
Sodium: 141 mmol/L (ref 135–145)

## 2017-02-15 ENCOUNTER — Telehealth (HOSPITAL_COMMUNITY): Payer: Self-pay | Admitting: Pharmacist

## 2017-02-15 NOTE — Telephone Encounter (Signed)
[employer] patient assistance approved for Eliquis 5 mg BID through 12/25/17.   Tyler Deis. Bonnye Fava, PharmD, BCPS, CPP Clinical Pharmacist Pager: [phone] Phone: [phone] 02/15/2017 11:30 AM

## 2017-02-22 ENCOUNTER — Encounter: Payer: Self-pay | Admitting: *Deleted

## 2017-02-22 ENCOUNTER — Ambulatory Visit (HOSPITAL_COMMUNITY)
Admission: RE | Admit: 2017-02-22 | Discharge: 2017-02-22 | Disposition: A | Discharge status: Home / Self Care | Payer: Medicare Other | Source: Ambulatory Visit | Attending: Internal Medicine | Admitting: Internal Medicine

## 2017-02-22 VITALS — BP 130/80 | HR 80 | Wt 185.0 lb

## 2017-02-22 DIAGNOSIS — N183 Chronic kidney disease, stage 3 unspecified: Secondary | ICD-10-CM

## 2017-02-22 DIAGNOSIS — I480 Paroxysmal atrial fibrillation: Secondary | ICD-10-CM | POA: Diagnosis not present

## 2017-02-22 DIAGNOSIS — D62 Acute posthemorrhagic anemia: Secondary | ICD-10-CM | POA: Insufficient documentation

## 2017-02-22 DIAGNOSIS — I4891 Unspecified atrial fibrillation: Secondary | ICD-10-CM | POA: Insufficient documentation

## 2017-02-22 DIAGNOSIS — Z7902 Long term (current) use of antithrombotics/antiplatelets: Secondary | ICD-10-CM | POA: Diagnosis not present

## 2017-02-22 DIAGNOSIS — I5022 Chronic systolic (congestive) heart failure: Secondary | ICD-10-CM | POA: Insufficient documentation

## 2017-02-22 DIAGNOSIS — I429 Cardiomyopathy, unspecified: Secondary | ICD-10-CM | POA: Diagnosis not present

## 2017-02-22 DIAGNOSIS — I2510 Atherosclerotic heart disease of native coronary artery without angina pectoris: Secondary | ICD-10-CM | POA: Insufficient documentation

## 2017-02-22 DIAGNOSIS — I130 Hypertensive heart and chronic kidney disease with heart failure and stage 1 through stage 4 chronic kidney disease, or unspecified chronic kidney disease: Secondary | ICD-10-CM | POA: Diagnosis not present

## 2017-02-22 LAB — BASIC METABOLIC PANEL
Anion gap: 7 (ref 5–15)
BUN: 16 mg/dL (ref 6–20)
CALCIUM: 9.6 mg/dL (ref 8.9–10.3)
CO2: 29 mmol/L (ref 22–32)
CREATININE: 1.19 mg/dL (ref 0.61–1.24)
Chloride: 102 mmol/L (ref 101–111)
Glucose, Bld: 100 mg/dL — ABNORMAL HIGH (ref 65–99)
Potassium: 4.5 mmol/L (ref 3.5–5.1)
SODIUM: 138 mmol/L (ref 135–145)

## 2017-02-22 NOTE — Progress Notes (Signed)
Advanced Heart Failure Medication Review by a Pharmacist  Does the patient  feel that his/her medications are working for him/her?  yes  Has the patient been experiencing any side effects to the medications prescribed?  yes  Does the patient measure his/her own blood pressure or blood glucose at home?  no   Does the patient have any problems obtaining medications due to transportation or finances?   no  Understanding of regimen: fair Understanding of indications: fair Potential of compliance: fair Patient understands to avoid NSAIDs. Patient understands to avoid decongestants.  Issues to address at subsequent visits: Compliance with anticoag   Pharmacist comments:  Martin [name] is a 71 yo M presenting with his wife and without a medication list. He reports good compliance with his regimen but refuses to take Eliquis 2/2 having a bleeding event in 2013. I asked if he has ever used Xarelto in the past and his wife stated that she thinks he did and it also "didn't agree with him". We discussed the risks of not taking anticoagulation with atrial fibrillation and they verbalized understanding but still were uncomfortable restarting an anticoagulant.   Martin [name]. Bonnye Fava, PharmD, BCPS, CPP Clinical Pharmacist Pager: [phone] Phone: [phone] 02/22/2017 12:05 PM      Time with patient: 10 minutes Preparation and documentation time: 2 minutes Total time: 12 minutes

## 2017-02-22 NOTE — Progress Notes (Signed)
Patient ID: Martin [name], male   DOB: 10/02/1946, 71 y.o.   MRN: [id number]     Advanced Heart Failure Clinic Note   Primary HF: Dr. Gala Romney  PCP: None   71 yo with history of prior cardiomyopathy presents for followup after admission for atypical atrial flutter with RVR and acute systolic CHF.  Patient was admitted in 4/13 with cardiogenic shock in the setting of atrial fibrillation, EF 30-35% by echo at that time.  With medical treatment, he improved, and later in 4/13, EF was up to normal range.  He was started on coumadin for atrial fibrillation but developed a rectus sheath hematoma (large) and anticoagulation was stopped. He was then lost to cardiology followup for several years.    Admitted to [facility] 05/17/16 with increased dyspnea progressively over the last few weeks. On admit creatine 1.7, K 4.4, BNP 905. Troponin 0.47 -> 25.87 -> 37.38. Due to worsening renal function and poor response to IV diuresis he transferred to [facility] for Advanced Heart Failure Team to manage. On arrival he was SOB at rest, 3+ lower extremity edema, hypotension, A fib RVR, and cool extremities. EF 20-25% Had central access placed to assess mixed venous saturation and guide diuresis. Placed on milrinone for cardiogenic shock as well as norepi and amio drip. Diuresed with IV lasix and once euvolemic he transitioned to lasix 40 mg daily. Overall he diuresed 29 pounds. Discharge weight 182 lbs.   Failed TEE/DC-CV 05/20/16  for persistent A fib on IV amio. s/p successful DCCV 05/25/16   Readmitted 2/6 through 02/07/2017 with marked volume overload and A fib RVR.Underwent successful DC-CV on 02/03/17 and maintained NSR on IV amiodarone. Discussed with EP and considered for AVN ablation with CRT-P due to recurrent AF, each time leading to cardiogenic shock. Refused AVN ablation and CRT-P. He was discharged on eliquis 5 mg twice a day. He requested a second opinion. Discharge weight 183 pounds.  Today he returns for post  hospital follow up with his wife. Overall feeling great. Says he is not interested in a second opinion for AVN ablation. Denies SOB/PND/Orthopnea. He never started taking eliquis after discharge. Weight at home 182 pounds. Taking all medications except eliquis. Able to walk 1 mile without stopping.   ECHO EF 20-25% 02/02/2017.   Labs (3/16): K 3.4, creatinine 2.34 Labs (04/01/2015) K 3.9 Creatinine 1.47  Labs (05/27/16) K 4.5, Creatinine 2.15 Labs 06/06/2016: K 4.7 Creatinine 1.33 Labs 02/14/2017: K 4.5 Creatinine 1.34   Past Medical History:  Diagnosis Date  . Acute blood loss anemia    Due to rectus sheath hematoma 03/2012  . Acute renal failure (HCC)    03/2012 (not on ACEI due to this) - renal US unremarkable  . Atrial fibrillation (HCC)   . Cardiomyopathy (HCC)    Transient cardiomyopathy with EF 30-35% 03/27/12, improved to 55-60% 04/16/12  . CHF (congestive heart failure) (HCC)   . Essential hypertension, benign   . Hematuria 03/2012  . Hyperglycemia 03/2012  . Pleural effusion 03/2012  . Rectus sheath hematoma    Spontaneous during 03/2012 hospitalization managed conservatively with blood products  . Respiratory failure (HCC)    VDRF 03/2012 in setting of CHF  . Shock (HCC)    03/2012 hospitalization - Cardiogenic shock requiring pressors initially, followed by hemorrhagic shock later in hospitalization  . Unspecified sleep apnea    Suspected during 03/2012 hospitalization      SH: Married, nonsmoker, occasional ETOH, lives in [place].   FH; No premature  CAD.  No cardiomyopathy.   ROS: All systems reviewed and negative except as per HPI.   Current Outpatient Prescriptions  Medication Sig Dispense Refill  . apixaban (ELIQUIS) 5 MG TABS tablet Take 1 tablet (5 mg total) by mouth 2 (two) times daily. 60 tablet 5  . atorvastatin (LIPITOR) 80 MG tablet Take 1 tablet (80 mg total) by mouth daily at 6 PM. 30 tablet 6  . furosemide (LASIX) 40 MG tablet Take 1 tablet (40 mg total) by mouth  daily. Take an extra 40mg  (1 tablet) for weight 190lbs or greater 30 tablet 6  . losartan (COZAAR) 25 MG tablet Take 0.5 tablets (12.5 mg total) by mouth at bedtime. 30 tablet 6  . Multiple Vitamin (MULTIVITAMIN WITH MINERALS) TABS tablet Take 1 tablet by mouth daily.     No current facility-administered medications for this encounter.    Wt Readings from Last 3 Encounters:  02/22/17 185 lb (83.9 kg)  02/07/17 183 lb 1.6 oz (83.1 kg)  11/03/16 189 lb (85.7 kg)     BP 130/80 (BP Location: Right Arm, Patient Position: Sitting, Cuff Size: Normal)   Pulse 80   Wt 185 lb (83.9 kg)   SpO2 100%   BMI 30.79 kg/m  General: NAD. Walked in the clinic. Wife present.  HEENT: Normal except for poor dentition Neck: supple. JVP 5-6. Carotids 2+ bilat; no bruits. No thyromegaly or nodule noted.  Cor: RRR,  no murmurs appreciated.   Lungs: CTAB on room air.  Abdomen: soft, NT, ND.  no HSM. No bruits or masses. +BS + mass in R adbomen Extremities: no cyanosis, clubbing, rash. No edema. Warm.   Neuro: alert & orientedx3, cranial nerves grossly intact. moves all 4 extremities w/o difficulty. Affect pleasant  EKG: NSR 97 bpm   Assessment/Plan: 1. Chronic systolic CHF: Suspect nonischemic cardiomyopathy. ECHO 01/2017 EF 20-25%.  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    NYHA II. Volume status stable. Continue lasix 40 mg daily.  Hold off on BB for now. Consider at next visit.  - Continue losartan 12.5 mg daily.  Do the following things EVERYDAY: 1) Weigh yourself in the morning before breakfast. Write it down and keep it in a log. 2) Take your medicines as prescribed 3) Eat low salt foods-Limit salt (sodium) to 2000 mg per day.  4) Stay as active as you can everyday 5) Limit all fluids for the day to less than 2 liters 2. Rash: -  Resolved once he stopped amio.  3. Atrial flutter/atrial fibrillation:  - s/p dccv 02/03/2017   - Intolerant of amio.  - He stopped eliquis once discharged.  He understands he is at risk for stroke.  Refuses to take anticoagulants.   He does not want to pursue AVN ablation.  4. CAD No CP.  - He refuses LHC . Continue statin.   4. CKD Stage III:  - Check BMET today. Creatinine 1.8 on discharge  5. Suspect OSA:  Was set up for sleep study but he cancelled.   Follow up 4-6 weeks.   [name] NP-C  02/22/2017

## 2017-02-22 NOTE — Patient Instructions (Signed)
Routine lab work today. Will notify you of abnormal results, otherwise no news is good news!  Follow up 6-8 weeks with [name] NP-C.  Do the following things EVERYDAY: 1) Weigh yourself in the morning before breakfast. Write it down and keep it in a log. 2) Take your medicines as prescribed 3) Eat low salt foods-Limit salt (sodium) to 2000 mg per day.  4) Stay as active as you can everyday 5) Limit all fluids for the day to less than 2 liters

## 2017-04-05 ENCOUNTER — Ambulatory Visit (HOSPITAL_COMMUNITY)
Admission: RE | Admit: 2017-04-05 | Discharge: 2017-04-05 | Disposition: A | Discharge status: Home / Self Care | Payer: Medicare Other | Source: Ambulatory Visit | Attending: Cardiology | Admitting: Cardiology

## 2017-04-05 VITALS — BP 130/68 | HR 75 | Wt 185.4 lb

## 2017-04-05 DIAGNOSIS — I130 Hypertensive heart and chronic kidney disease with heart failure and stage 1 through stage 4 chronic kidney disease, or unspecified chronic kidney disease: Secondary | ICD-10-CM | POA: Insufficient documentation

## 2017-04-05 DIAGNOSIS — I5022 Chronic systolic (congestive) heart failure: Secondary | ICD-10-CM | POA: Insufficient documentation

## 2017-04-05 DIAGNOSIS — I2510 Atherosclerotic heart disease of native coronary artery without angina pectoris: Secondary | ICD-10-CM | POA: Insufficient documentation

## 2017-04-05 DIAGNOSIS — I480 Paroxysmal atrial fibrillation: Secondary | ICD-10-CM

## 2017-04-05 DIAGNOSIS — I429 Cardiomyopathy, unspecified: Secondary | ICD-10-CM | POA: Insufficient documentation

## 2017-04-05 DIAGNOSIS — N183 Chronic kidney disease, stage 3 unspecified: Secondary | ICD-10-CM

## 2017-04-05 DIAGNOSIS — I4891 Unspecified atrial fibrillation: Secondary | ICD-10-CM | POA: Diagnosis not present

## 2017-04-05 DIAGNOSIS — G4730 Sleep apnea, unspecified: Secondary | ICD-10-CM | POA: Diagnosis not present

## 2017-04-05 DIAGNOSIS — I493 Ventricular premature depolarization: Secondary | ICD-10-CM | POA: Diagnosis not present

## 2017-04-05 MED ORDER — CARVEDILOL 3.125 MG PO TABS: 3.1250 mg | ORAL_TABLET | Freq: Two times a day (BID) | ORAL | 6 refills | Status: DC

## 2017-04-05 NOTE — Progress Notes (Signed)
Advanced Heart Failure Medication Review by a Pharmacist  Does the patient  feel that his/her medications are working for him/her?  yes  Has the patient been experiencing any side effects to the medications prescribed?  no  Does the patient measure his/her own blood pressure or blood glucose at home?  no   Does the patient have any problems obtaining medications due to transportation or finances?   no  Understanding of regimen: poor Understanding of indications: poor Potential of compliance: poor Patient understands to avoid NSAIDs. Patient understands to avoid decongestants.  Issues to address at subsequent visits: Compliance   Pharmacist comments: Martin [name] is a 71 yo M presenting with his wife and a medication list. He reports good compliance with his regimen but is still refusing anticoagulation. No other medication-related questions or concerns for me at this time.   Tyler Deis. Bonnye Fava, PharmD, BCPS, CPP Clinical Pharmacist Pager: [phone] Phone: [phone] 04/05/2017 9:50 AM      Time with patient: 6 minutes Preparation and documentation time: 2 minutes Total time: 8 minutes

## 2017-04-05 NOTE — Patient Instructions (Signed)
START Coreg 3.125 mg, one tab twice a day  Your physician recommends that you schedule a follow-up appointment in: 3 months  Do the following things EVERYDAY: 1) Weigh yourself in the morning before breakfast. Write it down and keep it in a log. 2) Take your medicines as prescribed 3) Eat low salt foods-Limit salt (sodium) to 2000 mg per day.  4) Stay as active as you can everyday 5) Limit all fluids for the day to less than 2 liters

## 2017-04-05 NOTE — Progress Notes (Signed)
Patient ID: Martin [name], male   DOB: [birth year], 71 y.o.   MRN: [id number]     Advanced Heart Failure Clinic Note   Primary HF: Dr. Gala Romney  PCP: None   71 yo with history of prior cardiomyopathy presents for followup after admission for atypical atrial flutter with RVR and acute systolic CHF.  Patient was admitted in 4/13 with cardiogenic shock in the setting of atrial fibrillation, EF 30-35% by echo at that time.  With medical treatment, he improved, and later in 4/13, EF was up to normal range.  He was started on coumadin for atrial fibrillation but developed a rectus sheath hematoma (large) and anticoagulation was stopped. He was then lost to cardiology followup for several years.    Admitted to [facility] 05/17/16 with increased dyspnea progressively over the last few weeks. On admit creatine 1.7, K 4.4, BNP 905. Troponin 0.47 -> 25.87 -> 37.38. Due to worsening renal function and poor response to IV diuresis he transferred to [facility] for Advanced Heart Failure Team to manage. On arrival he was SOB at rest, 3+ lower extremity edema, hypotension, A fib RVR, and cool extremities. EF 20-25% Had central access placed to assess mixed venous saturation and guide diuresis. Placed on milrinone for cardiogenic shock as well as norepi and amio drip. Diuresed with IV lasix and once euvolemic he transitioned to lasix 40 mg daily. Overall he diuresed 29 pounds. Discharge weight 182 lbs.   Failed TEE/DC-CV 05/20/16  for persistent A fib on IV amio. s/p successful DCCV 05/25/16   Readmitted 2/6 through 02/07/2017 with marked volume overload and A fib RVR.Underwent successful DC-CV on 02/03/17 and maintained NSR on IV amiodarone. Discussed with EP and considered for AVN ablation with CRT-P due to recurrent AF, each time leading to cardiogenic shock. Refused AVN ablation and CRT-P. He was discharged on eliquis 5 mg twice a day. He requested a second opinion. Discharge weight 183 pounds.  Today he returns for follow  for HF. Overall feeling great. Denies SOB/PND/Orthopnea. No CP. No fever or chills. Appetite good. Weight at home 182 pounds. Able to walk over a mile 4-5 times a week. He pays for all medications out of pocket.   ECHO EF 20-25% 02/02/2017.   Labs (3/16): K 3.4, creatinine 2.34 Labs (04/01/2015) K 3.9 Creatinine 1.47  Labs (05/27/16) K 4.5, Creatinine 2.15 Labs 06/06/2016: K 4.7 Creatinine 1.33 Labs 02/14/2017: K 4.5 Creatinine 1.34  Labs 02/22/2017: Creatinine 1.19   Past Medical History:  Diagnosis Date  . Acute blood loss anemia    Due to rectus sheath hematoma 03/2012  . Acute renal failure (HCC)    03/2012 (not on ACEI due to this) - renal US unremarkable  . Atrial fibrillation (HCC)   . Cardiomyopathy (HCC)    Transient cardiomyopathy with EF 30-35% 03/27/12, improved to 55-60% 04/16/12  . CHF (congestive heart failure) (HCC)   . Essential hypertension, benign   . Hematuria 03/2012  . Hyperglycemia 03/2012  . Pleural effusion 03/2012  . Rectus sheath hematoma    Spontaneous during 03/2012 hospitalization managed conservatively with blood products  . Respiratory failure (HCC)    VDRF 03/2012 in setting of CHF  . Shock (HCC)    03/2012 hospitalization - Cardiogenic shock requiring pressors initially, followed by hemorrhagic shock later in hospitalization  . Unspecified sleep apnea    Suspected during 03/2012 hospitalization      SH: Married, nonsmoker, occasional ETOH, lives in [place].   FH; No premature CAD.  No cardiomyopathy.  ROS: All systems reviewed and negative except as per HPI.   Current Outpatient Prescriptions  Medication Sig Dispense Refill  . atorvastatin (LIPITOR) 80 MG tablet Take 1 tablet (80 mg total) by mouth daily at 6 PM. 30 tablet 6  . furosemide (LASIX) 40 MG tablet Take 1 tablet (40 mg total) by mouth daily. Take an extra 40mg  (1 tablet) for weight 190lbs or greater 30 tablet 6  . losartan (COZAAR) 25 MG tablet Take 0.5 tablets (12.5 mg total) by mouth at  bedtime. 30 tablet 6  . Multiple Vitamin (MULTIVITAMIN WITH MINERALS) TABS tablet Take 1 tablet by mouth daily.     No current facility-administered medications for this encounter.    Wt Readings from Last 3 Encounters:  04/05/17 185 lb 6.4 oz (84.1 kg)  02/22/17 185 lb (83.9 kg)  02/07/17 183 lb 1.6 oz (83.1 kg)     BP 130/68 (BP Location: Right Arm, Patient Position: Sitting, Cuff Size: Normal)   Pulse 75   Wt 185 lb 6.4 oz (84.1 kg)   SpO2 97%   BMI 30.85 kg/m    General:  Well appearing. No resp difficulty. Walked in the clinic without difficulty  HEENT: normal Neck: supple. no JVD. Carotids 2+ bilat; no bruits. No lymphadenopathy or thryomegaly appreciated. Cor: PMI nondisplaced. Regular rate & rhythm. No rubs, gallops or murmurs. Lungs: clear Abdomen: soft, nontender, nondistended. No hepatosplenomegaly. No bruits or masses. Good bowel sounds.+ mass R abdomen Extremities: no cyanosis, clubbing, rash, edema Neuro: alert & orientedx3, cranial nerves grossly intact. moves all 4 extremities w/o difficulty. Affect pleasant  EKG: Personally reviewed. NSR with frequent PVCs 73 bpm.    Assessment/Plan: 1. Chronic systolic CHF: Suspect nonischemic cardiomyopathy. ECHO 01/2017 EF 20-25%.  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 NYHA II. Volume status stable. Continue lasix 40 mg daily.  Add 3.125 mg carvedilol twice a day. Encouraged to take with frequent pVCs.  Continue 12.5 mg losartan daily.  Next visit check BMET.  2. Atrial flutter/atrial fibrillation:  - s/p dccv 02/03/2017. Today he is in NSR  He refuses anticoagulants and understands he is at risk for stroke. He refuses  due to history of spontaneous hematoma.  -He does not want to pursue AVN ablation.  3. CAD- NO CP. Continue statin and adding bb as above. No CP   4.  CKD Stage III:  Reviewed Creatinine from 2/28. Stable 1.19. Repeat BMET next visit.  5. . Suspect OSA:  Was set up for sleep study but he  cancelled. 6. PVCs- Note on EKG today. Adding carvedilol. Discussed purpose of carvedilol. He was some what reluctant to start a medication.     Follow up in 3 months. Set up repeat ECHO at that time.   [name] NP-C  10:36 AM     [name] NP-C  04/05/2017

## 2017-04-17 DIAGNOSIS — H1132 Conjunctival hemorrhage, left eye: Secondary | ICD-10-CM | POA: Diagnosis not present

## 2017-07-05 ENCOUNTER — Encounter (HOSPITAL_COMMUNITY): Payer: Medicare Other

## 2018-06-18 DIAGNOSIS — N179 Acute kidney failure, unspecified: Secondary | ICD-10-CM | POA: Diagnosis not present

## 2018-06-18 DIAGNOSIS — I4891 Unspecified atrial fibrillation: Secondary | ICD-10-CM | POA: Diagnosis not present

## 2018-06-18 DIAGNOSIS — N183 Chronic kidney disease, stage 3 (moderate): Secondary | ICD-10-CM | POA: Diagnosis not present

## 2018-06-18 DIAGNOSIS — I5031 Acute diastolic (congestive) heart failure: Secondary | ICD-10-CM | POA: Diagnosis not present

## 2018-06-18 DIAGNOSIS — R0602 Shortness of breath: Secondary | ICD-10-CM | POA: Diagnosis not present

## 2018-06-18 DIAGNOSIS — E872 Acidosis: Secondary | ICD-10-CM | POA: Diagnosis not present

## 2018-06-18 DIAGNOSIS — I2699 Other pulmonary embolism without acute cor pulmonale: Secondary | ICD-10-CM | POA: Diagnosis not present

## 2018-06-18 DIAGNOSIS — N289 Disorder of kidney and ureter, unspecified: Secondary | ICD-10-CM | POA: Diagnosis not present

## 2018-06-18 DIAGNOSIS — Z79899 Other long term (current) drug therapy: Secondary | ICD-10-CM | POA: Diagnosis not present

## 2018-06-19 ENCOUNTER — Inpatient Hospital Stay (HOSPITAL_COMMUNITY): Payer: Medicare HMO

## 2018-06-19 ENCOUNTER — Inpatient Hospital Stay (HOSPITAL_COMMUNITY)
Admission: AD | Admit: 2018-06-19 | Discharge: 2018-06-29 | DRG: 291 | Disposition: A | Discharge status: Home / Self Care | Payer: Medicare HMO | Source: Other Acute Inpatient Hospital | Attending: Internal Medicine | Admitting: Internal Medicine

## 2018-06-19 DIAGNOSIS — I248 Other forms of acute ischemic heart disease: Secondary | ICD-10-CM | POA: Diagnosis present

## 2018-06-19 DIAGNOSIS — J810 Acute pulmonary edema: Secondary | ICD-10-CM

## 2018-06-19 DIAGNOSIS — I130 Hypertensive heart and chronic kidney disease with heart failure and stage 1 through stage 4 chronic kidney disease, or unspecified chronic kidney disease: Secondary | ICD-10-CM | POA: Diagnosis not present

## 2018-06-19 DIAGNOSIS — Z79899 Other long term (current) drug therapy: Secondary | ICD-10-CM

## 2018-06-19 DIAGNOSIS — R570 Cardiogenic shock: Secondary | ICD-10-CM | POA: Diagnosis present

## 2018-06-19 DIAGNOSIS — J9601 Acute respiratory failure with hypoxia: Secondary | ICD-10-CM | POA: Diagnosis not present

## 2018-06-19 DIAGNOSIS — R748 Abnormal levels of other serum enzymes: Secondary | ICD-10-CM | POA: Diagnosis not present

## 2018-06-19 DIAGNOSIS — I428 Other cardiomyopathies: Secondary | ICD-10-CM | POA: Diagnosis present

## 2018-06-19 DIAGNOSIS — I509 Heart failure, unspecified: Secondary | ICD-10-CM | POA: Diagnosis not present

## 2018-06-19 DIAGNOSIS — N179 Acute kidney failure, unspecified: Secondary | ICD-10-CM | POA: Diagnosis not present

## 2018-06-19 DIAGNOSIS — I2699 Other pulmonary embolism without acute cor pulmonale: Secondary | ICD-10-CM | POA: Diagnosis not present

## 2018-06-19 DIAGNOSIS — I4891 Unspecified atrial fibrillation: Secondary | ICD-10-CM | POA: Diagnosis not present

## 2018-06-19 DIAGNOSIS — I959 Hypotension, unspecified: Secondary | ICD-10-CM

## 2018-06-19 DIAGNOSIS — J189 Pneumonia, unspecified organism: Secondary | ICD-10-CM | POA: Diagnosis not present

## 2018-06-19 DIAGNOSIS — G4733 Obstructive sleep apnea (adult) (pediatric): Secondary | ICD-10-CM | POA: Diagnosis not present

## 2018-06-19 DIAGNOSIS — Z452 Encounter for adjustment and management of vascular access device: Secondary | ICD-10-CM

## 2018-06-19 DIAGNOSIS — N189 Chronic kidney disease, unspecified: Secondary | ICD-10-CM | POA: Diagnosis not present

## 2018-06-19 DIAGNOSIS — E876 Hypokalemia: Secondary | ICD-10-CM | POA: Diagnosis not present

## 2018-06-19 DIAGNOSIS — E871 Hypo-osmolality and hyponatremia: Secondary | ICD-10-CM | POA: Diagnosis not present

## 2018-06-19 DIAGNOSIS — T148XXA Other injury of unspecified body region, initial encounter: Secondary | ICD-10-CM

## 2018-06-19 DIAGNOSIS — Z8249 Family history of ischemic heart disease and other diseases of the circulatory system: Secondary | ICD-10-CM

## 2018-06-19 DIAGNOSIS — R7989 Other specified abnormal findings of blood chemistry: Secondary | ICD-10-CM | POA: Diagnosis present

## 2018-06-19 DIAGNOSIS — J9801 Acute bronchospasm: Secondary | ICD-10-CM | POA: Diagnosis not present

## 2018-06-19 DIAGNOSIS — Z888 Allergy status to other drugs, medicaments and biological substances status: Secondary | ICD-10-CM

## 2018-06-19 DIAGNOSIS — I5043 Acute on chronic combined systolic (congestive) and diastolic (congestive) heart failure: Secondary | ICD-10-CM | POA: Diagnosis not present

## 2018-06-19 DIAGNOSIS — M7981 Nontraumatic hematoma of soft tissue: Secondary | ICD-10-CM | POA: Diagnosis not present

## 2018-06-19 DIAGNOSIS — I4892 Unspecified atrial flutter: Secondary | ICD-10-CM | POA: Diagnosis present

## 2018-06-19 DIAGNOSIS — E872 Acidosis, unspecified: Secondary | ICD-10-CM | POA: Diagnosis present

## 2018-06-19 DIAGNOSIS — R778 Other specified abnormalities of plasma proteins: Secondary | ICD-10-CM | POA: Diagnosis present

## 2018-06-19 DIAGNOSIS — D631 Anemia in chronic kidney disease: Secondary | ICD-10-CM | POA: Diagnosis present

## 2018-06-19 DIAGNOSIS — I480 Paroxysmal atrial fibrillation: Secondary | ICD-10-CM | POA: Diagnosis not present

## 2018-06-19 DIAGNOSIS — I5082 Biventricular heart failure: Secondary | ICD-10-CM | POA: Diagnosis present

## 2018-06-19 DIAGNOSIS — J9600 Acute respiratory failure, unspecified whether with hypoxia or hypercapnia: Secondary | ICD-10-CM

## 2018-06-19 DIAGNOSIS — Z9114 Patient's other noncompliance with medication regimen: Secondary | ICD-10-CM | POA: Diagnosis not present

## 2018-06-19 DIAGNOSIS — I5023 Acute on chronic systolic (congestive) heart failure: Secondary | ICD-10-CM | POA: Diagnosis not present

## 2018-06-19 DIAGNOSIS — E875 Hyperkalemia: Secondary | ICD-10-CM | POA: Diagnosis not present

## 2018-06-19 DIAGNOSIS — J90 Pleural effusion, not elsewhere classified: Secondary | ICD-10-CM

## 2018-06-19 DIAGNOSIS — R1900 Intra-abdominal and pelvic swelling, mass and lump, unspecified site: Secondary | ICD-10-CM

## 2018-06-19 DIAGNOSIS — R0602 Shortness of breath: Secondary | ICD-10-CM | POA: Diagnosis not present

## 2018-06-19 DIAGNOSIS — T502X5A Adverse effect of carbonic-anhydrase inhibitors, benzothiadiazides and other diuretics, initial encounter: Secondary | ICD-10-CM | POA: Diagnosis not present

## 2018-06-19 DIAGNOSIS — R945 Abnormal results of liver function studies: Secondary | ICD-10-CM | POA: Diagnosis not present

## 2018-06-19 DIAGNOSIS — R0989 Other specified symptoms and signs involving the circulatory and respiratory systems: Secondary | ICD-10-CM | POA: Diagnosis not present

## 2018-06-19 DIAGNOSIS — E785 Hyperlipidemia, unspecified: Secondary | ICD-10-CM | POA: Diagnosis not present

## 2018-06-19 DIAGNOSIS — J9690 Respiratory failure, unspecified, unspecified whether with hypoxia or hypercapnia: Secondary | ICD-10-CM | POA: Diagnosis not present

## 2018-06-19 DIAGNOSIS — N183 Chronic kidney disease, stage 3 unspecified: Secondary | ICD-10-CM | POA: Diagnosis present

## 2018-06-19 DIAGNOSIS — N170 Acute kidney failure with tubular necrosis: Secondary | ICD-10-CM | POA: Diagnosis not present

## 2018-06-19 DIAGNOSIS — R1901 Right upper quadrant abdominal swelling, mass and lump: Secondary | ICD-10-CM | POA: Diagnosis not present

## 2018-06-19 DIAGNOSIS — N182 Chronic kidney disease, stage 2 (mild): Secondary | ICD-10-CM | POA: Diagnosis not present

## 2018-06-19 LAB — BLOOD GAS, ARTERIAL
ACID-BASE DEFICIT: 16.9 mmol/L — AB (ref 0.0–2.0)
Bicarbonate: 9.7 mmol/L — ABNORMAL LOW (ref 20.0–28.0)
DRAWN BY: 39898
O2 CONTENT: 2 L/min
O2 SAT: 91.9 %
PATIENT TEMPERATURE: 99.1
pCO2 arterial: 26.4 mmHg — ABNORMAL LOW (ref 32.0–48.0)
pH, Arterial: 7.19 — CL (ref 7.350–7.450)
pO2, Arterial: 83.7 mmHg (ref 83.0–108.0)

## 2018-06-19 LAB — BASIC METABOLIC PANEL
ANION GAP: 24 — AB (ref 5–15)
BUN: 59 mg/dL — ABNORMAL HIGH (ref 8–23)
CHLORIDE: 102 mmol/L (ref 98–111)
CO2: 8 mmol/L — AB (ref 22–32)
CREATININE: 3.58 mg/dL — AB (ref 0.61–1.24)
Calcium: 8.2 mg/dL — ABNORMAL LOW (ref 8.9–10.3)
GFR calc non Af Amer: 16 mL/min — ABNORMAL LOW (ref >60)
GFR, EST AFRICAN AMERICAN: 19 mL/min — AB (ref >60)
Glucose, Bld: 148 mg/dL — ABNORMAL HIGH (ref 70–99)
POTASSIUM: 6.1 mmol/L — AB (ref 3.5–5.1)
Sodium: 134 mmol/L — ABNORMAL LOW (ref 135–145)

## 2018-06-19 LAB — MRSA PCR SCREENING: MRSA by PCR: NEGATIVE

## 2018-06-19 LAB — BRAIN NATRIURETIC PEPTIDE: B Natriuretic Peptide: 1948 pg/mL — ABNORMAL HIGH (ref 0.0–100.0)

## 2018-06-19 LAB — TROPONIN I: TROPONIN I: 0.27 ng/mL — AB (ref <0.03)

## 2018-06-19 MED ORDER — SODIUM POLYSTYRENE SULFONATE 15 GM/60ML PO SUSP
30.0000 g | Freq: Once | ORAL | Status: DC
  Filled 2018-06-19: qty 120

## 2018-06-19 MED ORDER — HYDRALAZINE HCL 20 MG/ML IJ SOLN: 5.0000 mg | INTRAMUSCULAR | Status: DC | PRN

## 2018-06-19 MED ORDER — MORPHINE SULFATE (PF) 2 MG/ML IV SOLN: 2.0000 mg | INTRAVENOUS | Status: DC | PRN

## 2018-06-19 MED ORDER — LEVALBUTEROL HCL 1.25 MG/0.5ML IN NEBU
1.2500 mg | INHALATION_SOLUTION | Freq: Four times a day (QID) | RESPIRATORY_TRACT | Status: DC
  Administered 2018-06-19: 1.25 mg via RESPIRATORY_TRACT
  Filled 2018-06-19: qty 0.5

## 2018-06-19 MED ORDER — APIXABAN 5 MG PO TABS: 10.0000 mg | ORAL_TABLET | Freq: Two times a day (BID) | ORAL | Status: DC

## 2018-06-19 MED ORDER — ATORVASTATIN CALCIUM 80 MG PO TABS: 80.0000 mg | ORAL_TABLET | Freq: Every day | ORAL | Status: DC

## 2018-06-19 MED ORDER — SODIUM CHLORIDE 0.9% FLUSH: 3.0000 mL | INTRAVENOUS | Status: DC | PRN

## 2018-06-19 MED ORDER — FUROSEMIDE 10 MG/ML IJ SOLN
120.0000 mg | Freq: Once | INTRAVENOUS | Status: DC
  Filled 2018-06-19: qty 12

## 2018-06-19 MED ORDER — DILTIAZEM HCL-DEXTROSE 100-5 MG/100ML-% IV SOLN (PREMIX): 5.0000 mg/h | INTRAVENOUS | Status: DC

## 2018-06-19 MED ORDER — ZOLPIDEM TARTRATE 5 MG PO TABS: 5.0000 mg | ORAL_TABLET | Freq: Every evening | ORAL | Status: DC | PRN

## 2018-06-19 MED ORDER — ADULT MULTIVITAMIN W/MINERALS CH
1.0000 | ORAL_TABLET | Freq: Every day | ORAL | Status: DC
  Administered 2018-06-21 – 2018-06-29 (×7): 1 via ORAL
  Filled 2018-06-19 (×7): qty 1

## 2018-06-19 MED ORDER — APIXABAN 5 MG PO TABS: 5.0000 mg | ORAL_TABLET | Freq: Two times a day (BID) | ORAL | Status: DC

## 2018-06-19 MED ORDER — DM-GUAIFENESIN ER 30-600 MG PO TB12: 1.0000 | ORAL_TABLET | Freq: Two times a day (BID) | ORAL | Status: DC | PRN

## 2018-06-19 MED ORDER — DEXTROSE 50 % IV SOLN: 50.0000 mL | INTRAVENOUS | Status: DC | PRN

## 2018-06-19 MED ORDER — ONDANSETRON HCL 4 MG/2ML IJ SOLN: 4.0000 mg | Freq: Three times a day (TID) | INTRAMUSCULAR | Status: DC | PRN

## 2018-06-19 MED ORDER — IPRATROPIUM BROMIDE 0.02 % IN SOLN
0.5000 mg | RESPIRATORY_TRACT | Status: DC
  Administered 2018-06-19: 0.5 mg via RESPIRATORY_TRACT
  Filled 2018-06-19: qty 2.5

## 2018-06-19 MED ORDER — INSULIN ASPART 100 UNIT/ML IV SOLN
5.0000 [IU] | Freq: Once | INTRAVENOUS | Status: AC
  Administered 2018-06-20: 5 [IU] via INTRAVENOUS

## 2018-06-19 MED ORDER — SODIUM CHLORIDE 0.9 % IV SOLN
250.0000 mL | INTRAVENOUS | Status: DC | PRN
  Administered 2018-06-20 – 2018-06-22 (×4): 250 mL via INTRAVENOUS

## 2018-06-19 MED ORDER — DEXTROSE 50 % IV SOLN
50.0000 mL | Freq: Once | INTRAVENOUS | Status: AC
  Administered 2018-06-20: 50 mL via INTRAVENOUS
  Filled 2018-06-19: qty 50

## 2018-06-19 MED ORDER — SODIUM CHLORIDE 0.9% FLUSH
3.0000 mL | Freq: Two times a day (BID) | INTRAVENOUS | Status: DC
  Administered 2018-06-20 – 2018-06-22 (×4): 3 mL via INTRAVENOUS

## 2018-06-19 NOTE — Consult Note (Addendum)
Reason for Consult: Afib, Advanced CHF   Requesting Physician/Service: [facility] Augusto Garbe)   PCP:  Patient, No Pcp Per Primary Cardiologist:Bensimhon  HPI: This is a 72 year old with history of probable nonischemic cardiomyopathy possibly chiefly related to tachycardia mediated cardiomyopathy followed by Dr. Gala Romney.  Other significant history includes acute renal failure, A. fib/flutter not on anticoagulation due to patient preference related to a previous large rectus sheath hematoma, issues with patient compliance.  History is significant for repeated episodes over the past 3 years of rapid ventricular response from atrial arrhythmias resulting in acute systolic heart failure.  One specific episode required IV milrinone, diuretics, norepinephrine to support him through diuresis.  He has required previous cardioversions.  His care has been complicated more recently by amiodarone rash x2.  He is been advised to have biventricular pacer with AV nodal ablation which she has previously declined.  He tells me that he has been out of his medications for a couple weeks.  This led to fluid accumulation and presentation to [facility] rocking him yesterday. Patient was admitted on 6/24.  He presented with 3 days of increased dyspnea and edema.  Was found to have A. fib with RVR.  D-dimer was elevated and CT angio showed right lower lobe pulmonary embolism.  Blood pressure was low which improved with fluid bolus.  He was started on IV diltiazem for rate control and admitted to the ICU at the outside hospital.  Hemoglobin 15.6.  Platelets 151,000.  Creatinine 1.84.  Bicarb 20.  BUN 41.  Creatinine is at baseline.  Lactate was elevated at 4.  BMP greater than 10,000.  Chest x-ray showed right basal infiltrate and small effusion.  TTE with biventricular dysfunction EF roughly 20%.  Overnight at outside hospital heart rates remained elevated and came down with metoprolol.  He later converted to sinus rhythm.  The next  morning, he became more dyspneic.  He got 40 mill grams of IV Lasix with some output.  Blood pressure was on the low side.  For his pulmonary embolism he was started on Eliquis 10 mg twice daily.  ECG here with sinus tachycardia, low voltage.  Upon arrival at [facility], he was in respiratory distress with respiratory rate in the 30s.  Blood gas showed a metabolic acidosis pH 7.14 with PCO2 in the 20s.  He was clearly unable to compensate and was placed on BiPAP.  Foley has been placed without any output as of yet.  Currently in sinus tachycardia rate low 100s.  He is dyspneic but feels better on BiPAP.  He is able to communicate and relays a story of his issues with compliance stating he ran out of medications recently.  He is self-pay.    Previous Cardiac Studies: TEE 01/2017 - Left ventricle: The cavity size was dilated. The estimated   ejection fraction was in the range of 15% to 20%. Diffuse   hypokinesis. - Aortic valve: No evidence of vegetation. There was trivial   regurgitation. - Mitral valve: There was moderate to severe regurgitation. - Left atrium: The atrium was moderately to severely dilated. No   evidence of thrombus in the atrial cavity or appendage. - Right ventricle: Systolic function was severely reduced. - Right atrium: The atrium was dilated. - Atrial septum: No defect or patent foramen ovale was identified. - Tricuspid valve: No evidence of vegetation. - Pulmonic valve: No evidence of vegetation.  TTE 01/2017 Study Conclusions - Left ventricle: The cavity size was normal. Wall thickness was  normal. Systolic function was severely reduced. The estimated   ejection fraction was in the range of 20% to 25%. Severe diffuse   hypokinesis with no identifiable regional variations. - Mitral valve: Moderate, holosystolicprolapse, involving the   anterior leaflet. There was moderate to severe regurgitation   directed eccentrically and posteriorly. - Left atrium: The atrium  was severely dilated. - Right ventricle: Systolic function was moderately reduced. - Right atrium: The atrium was moderately dilated.  Past Medical History:  Diagnosis Date  . Acute blood loss anemia    Due to rectus sheath hematoma 03/2012  . Acute renal failure (HCC)    03/2012 (not on ACEI due to this) - renal US unremarkable  . Atrial fibrillation (HCC)   . Cardiomyopathy (HCC)    Transient cardiomyopathy with EF 30-35% 03/27/12, improved to 55-60% 04/16/12  . CHF (congestive heart failure) (HCC)   . Essential hypertension, benign   . Hematuria 03/2012  . Hyperglycemia 03/2012  . Pleural effusion 03/2012  . Rectus sheath hematoma    Spontaneous during 03/2012 hospitalization managed conservatively with blood products  . Respiratory failure (HCC)    VDRF 03/2012 in setting of CHF  . Shock (HCC)    03/2012 hospitalization - Cardiogenic shock requiring pressors initially, followed by hemorrhagic shock later in hospitalization  . Unspecified sleep apnea    Suspected during 03/2012 hospitalization     Past Surgical History:  Procedure Laterality Date  . CARDIOVERSION N/A 03/05/2015   Procedure: CARDIOVERSION;  Surgeon: Dolores Patty, MD;  Location: [facility] ENDOSCOPY;  Service: Cardiovascular;  Laterality: N/A;  . CARDIOVERSION N/A 05/20/2016   Procedure: CARDIOVERSION;  Surgeon: Dolores Patty, MD;  Location: [facility] OR;  Service: Cardiovascular;  Laterality: N/A;  . CARDIOVERSION N/A 05/25/2016   Procedure: CARDIOVERSION;  Surgeon: Vesta Mixer, MD;  Location: [facility] ENDOSCOPY;  Service: Cardiovascular;  Laterality: N/A;  . CARDIOVERSION N/A 02/03/2017   Procedure: CARDIOVERSION;  Surgeon: Dolores Patty, MD;  Location: [facility] ENDOSCOPY;  Service: Cardiovascular;  Laterality: N/A;  . TEE WITHOUT CARDIOVERSION N/A 03/05/2015   Procedure: TRANSESOPHAGEAL ECHOCARDIOGRAM (TEE);  Surgeon: Dolores Patty, MD;  Location: [facility] ENDOSCOPY;  Service: Cardiovascular;  Laterality: N/A;  . TEE WITHOUT  CARDIOVERSION N/A 05/20/2016   Procedure: TRANSESOPHAGEAL ECHOCARDIOGRAM (TEE);  Surgeon: Dolores Patty, MD;  Location: [facility] OR;  Service: Cardiovascular;  Laterality: N/A;  . TEE WITHOUT CARDIOVERSION N/A 02/03/2017   Procedure: TRANSESOPHAGEAL ECHOCARDIOGRAM (TEE);  Surgeon: Dolores Patty, MD;  Location: [facility] ENDOSCOPY;  Service: Cardiovascular;  Laterality: N/A;    Family History  Problem Relation Age of Onset  . Emphysema Father   . Heart disease Father   . Other Mother        died during a leg surgery  . Diabetes Brother   . Heart attack Brother    Social History:  reports that he has never smoked. He has never used smokeless tobacco. He reports that he drinks alcohol. He reports that he does not use drugs.  Allergies:  Allergies  Allergen Reactions  . Amiodarone Rash    Rash resolved when amio stopped 2013, retried in 2017 with same reaction    No current facility-administered medications on file prior to encounter.    Current Outpatient Medications on File Prior to Encounter  Medication Sig Dispense Refill  . atorvastatin (LIPITOR) 80 MG tablet Take 1 tablet (80 mg total) by mouth daily at 6 PM. 30 tablet 6  . carvedilol (COREG) 3.125 MG tablet Take 1 tablet (  3.125 mg total) by mouth 2 (two) times daily with a meal. 60 tablet 6  . furosemide (LASIX) 40 MG tablet Take 1 tablet (40 mg total) by mouth daily. Take an extra 40mg  (1 tablet) for weight 190lbs or greater 30 tablet 6  . losartan (COZAAR) 25 MG tablet Take 0.5 tablets (12.5 mg total) by mouth at bedtime. 30 tablet 6  . Multiple Vitamin (MULTIVITAMIN WITH MINERALS) TABS tablet Take 1 tablet by mouth daily.      Results for orders placed or performed during the hospital encounter of 06/19/18 (from the past 48 hour(s))  Blood gas, arterial     Status: Abnormal   Collection Time: 06/19/18  9:44 PM  Result Value Ref Range   O2 Content 2.0 L/min   Delivery systems NASAL CANNULA    pH, Arterial 7.190 (LL) 7.350 -  7.450    Comment: CRITICAL RESULT CALLED TO, READ BACK BY AND VERIFIED WITH: BYRD,C RRT AT 2155 ON 06/19/2018 BY DAY,J RRT    pCO2 arterial 26.4 (L) 32.0 - 48.0 mmHg   pO2, Arterial 83.7 83.0 - 108.0 mmHg   Bicarbonate 9.7 (L) 20.0 - 28.0 mmol/L   Acid-base deficit 16.9 (H) 0.0 - 2.0 mmol/L   O2 Saturation 91.9 %   Patient temperature 99.1    Collection site LEFT RADIAL    Drawn by [phone]    Sample type ARTERIAL    Allens test (pass/fail) PASS PASS   Dg Chest Port 1 View  Result Date: 06/19/2018 CLINICAL DATA:  Dyspnea EXAM: PORTABLE CHEST 1 VIEW COMPARISON:  06/19/2018 chest radiograph. FINDINGS: Stable cardiomediastinal silhouette with mild cardiomegaly. No pneumothorax. Small right pleural effusion, stable. No left pleural effusion. No overt pulmonary edema. Mild bibasilar atelectasis. IMPRESSION: 1. Stable mild cardiomegaly without overt pulmonary edema. 2. Stable small right pleural effusion and mild bibasilar atelectasis. Electronically Signed   By: Delbert Phenix M.D.   On: 06/19/2018 22:02    ECG/TELE: Sinus tach v junctional tach rate 110 regular narrow complex no ST-T wave changes  ROS: As above. Otherwise, review of systems is negative unless per above HPI  Vitals:   06/19/18 2154  Pulse: (!) 107  Resp: (!) 36   Wt Readings from Last 10 Encounters:  04/05/17 84.1 kg (185 lb 6.4 oz)  02/22/17 83.9 kg (185 lb)  02/07/17 83.1 kg (183 lb 1.6 oz)  11/03/16 85.7 kg (189 lb)  09/14/16 84.9 kg (187 lb 4 oz)  06/06/16 85.3 kg (188 lb)  05/27/16 82.7 kg (182 lb 5.1 oz)  06/04/15 89.7 kg (197 lb 12 oz)  04/23/15 90.2 kg (198 lb 12.8 oz)  04/01/15 87.2 kg (192 lb 4 oz)    PE:  General: Respiratory distress HEENT: Atraumatic, EOMI, JVD at ear bilaterally at 40 degrees.  CV: Tachycardic, S3.  Respiratory: Exp wheezes and crackles, inc WOB ABD: Large grapefruit size hard mass RUQ (noted to be from prev hematoma) Extremities: 1+ radial pulses bilaterally. 2+ edema, cold  extremities Neuro/Psych: CN grossly intact, alert and oriented  Assessment/Plan Advanced heart failure EF 15 to 20% biventricular thought to be secondary to nonischemic etiology/tachycardia with acute systolic exacerbation Segmental pulmonary embolism, newly diagnosed Atrial fibrillation/flutter Respiratory distress on BiPAP Metabolic acidosis Chronic renal insufficiency   At this time, patient is in sinus tachycardia versus junctional tachycardia.  In transfer notes there is a CD with echocardiogram showing severe biventricular dysfunction EF 15 to 20%.  Likely exacerbated by tachycardia, recent medication noncompliance, segmental PE (unclear chronicity), and  possibly further exacerbated by fluid resuscitation and IV beta-blockade used to rate control A. Fib treat hypertension.  He is currently stabilizing with the addition of BiPAP.  He is volume overloaded and we will dose him with IV 120 mg Lasix (40 mg given at OSH) and monitor output overnight and re-dose as needed.  Cold and wet on exam. Hopefully, the recent contrast load from his CT scan in the setting of CKD will not affect GFR.  He has an amiodarone allergy which causes a rash and should he have recurrent A. fib with RVR we can consider IV digoxin loading dose plus or minus cardioversion depending on how unstable he is.  Ultimately, this episode again proves the importance of pursuing AV nodal ablation and biventricular pacing should he be open to this.  Recs: - IV lasix 120 mg IV x 1, re-dose pending output - Recurrent Afib, IV digoxin load per pharmacy recs (try to avoid IV BB given acute CHF exac) - Low threshold to transfer to higher level of care  Addendum: BMP returned and CRT elevated to 3.5 with K up to 6.9.  Patient transferred to ICU. Nephrology to be consulted.  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  Fluid removal will be challenging without UF. This limits ability for milrinone and his arrhythmia history makes  dobutamine challenged.  Goals of care discussions I.e. thoughts on long term dialysis are important.   Greig Castilla Means  MD 06/19/2018, 10:29 PM

## 2018-06-19 NOTE — Progress Notes (Signed)
ANTICOAGULATION CONSULT NOTE - Initial Consult  Pharmacy Consult for apixaban Indication: pulmonary embolus  Allergies  Allergen Reactions  . Amiodarone Rash    Rash resolved when amio stopped 2013, retried in 2017 with same reaction    Patient Measurements:   Heparin Dosing Weight: n/a   Vital Signs: Pulse Rate: 107 (06/25 2154)  Labs: No results for input(s): HGB, HCT, PLT, APTT, LABPROT, INR, HEPARINUNFRC, HEPRLOWMOCWT, CREATININE, CKTOTAL, CKMB, TROPONINI in the last 72 hours.  CrCl cannot be calculated (Patient's most recent lab result is older than the maximum 21 days allowed.).   Medical History: Past Medical History:  Diagnosis Date  . Acute blood loss anemia    Due to rectus sheath hematoma 03/2012  . Acute renal failure (HCC)    03/2012 (not on ACEI due to this) - renal US unremarkable  . Atrial fibrillation (HCC)   . Cardiomyopathy (HCC)    Transient cardiomyopathy with EF 30-35% 03/27/12, improved to 55-60% 04/16/12  . CHF (congestive heart failure) (HCC)   . Essential hypertension, benign   . Hematuria 03/2012  . Hyperglycemia 03/2012  . Pleural effusion 03/2012  . Rectus sheath hematoma    Spontaneous during 03/2012 hospitalization managed conservatively with blood products  . Respiratory failure (HCC)    VDRF 03/2012 in setting of CHF  . Shock (HCC)    03/2012 hospitalization - Cardiogenic shock requiring pressors initially, followed by hemorrhagic shock later in hospitalization  . Unspecified sleep apnea    Suspected during 03/2012 hospitalization     Medications:  Medications Prior to Admission  Medication Sig Dispense Refill Last Dose  . atorvastatin (LIPITOR) 80 MG tablet Take 1 tablet (80 mg total) by mouth daily at 6 PM. 30 tablet 6 Taking  . carvedilol (COREG) 3.125 MG tablet Take 1 tablet (3.125 mg total) by mouth 2 (two) times daily with a meal. 60 tablet 6   . furosemide (LASIX) 40 MG tablet Take 1 tablet (40 mg total) by mouth daily. Take an extra  40mg  (1 tablet) for weight 190lbs or greater 30 tablet 6 Taking  . losartan (COZAAR) 25 MG tablet Take 0.5 tablets (12.5 mg total) by mouth at bedtime. 30 tablet 6 Taking  . Multiple Vitamin (MULTIVITAMIN WITH MINERALS) TABS tablet Take 1 tablet by mouth daily.   Taking    Assessment: 61 YOM who transferred from OSH with new diagnosis of right lower lobe PE. Was started on apixaban today at OSH and given one dose of 10 mg in AM. Pharmacy consulted to resume treatment dose of apixaban. At OSH, labs were as follows, Hgb 15.6, Plt 151k. SCr 1.84   Goal of Therapy:  PE treatment Monitor platelets by anticoagulation protocol: Yes   Plan:  -Apixaban 10 mg twice daily x 7 days, then apixaban 5 mg twice daily -Monitor closely for s/s of bleeding -Educate patient on apixaban   Vinnie Level, PharmD., BCPS Clinical Pharmacist Clinical phone for 06/19/18 until 10:30pm: [id number] If after 10:30pm, please call main pharmacy at: [phone]

## 2018-06-20 ENCOUNTER — Inpatient Hospital Stay (HOSPITAL_COMMUNITY): Payer: Medicare HMO

## 2018-06-20 ENCOUNTER — Encounter (HOSPITAL_COMMUNITY): Payer: Medicare HMO

## 2018-06-20 ENCOUNTER — Other Ambulatory Visit (HOSPITAL_COMMUNITY): Payer: Medicare HMO

## 2018-06-20 DIAGNOSIS — J810 Acute pulmonary edema: Secondary | ICD-10-CM

## 2018-06-20 DIAGNOSIS — I2699 Other pulmonary embolism without acute cor pulmonale: Secondary | ICD-10-CM

## 2018-06-20 DIAGNOSIS — N170 Acute kidney failure with tubular necrosis: Secondary | ICD-10-CM

## 2018-06-20 DIAGNOSIS — E872 Acidosis: Secondary | ICD-10-CM

## 2018-06-20 DIAGNOSIS — N183 Chronic kidney disease, stage 3 (moderate): Secondary | ICD-10-CM

## 2018-06-20 DIAGNOSIS — E875 Hyperkalemia: Secondary | ICD-10-CM

## 2018-06-20 DIAGNOSIS — N182 Chronic kidney disease, stage 2 (mild): Secondary | ICD-10-CM

## 2018-06-20 DIAGNOSIS — N179 Acute kidney failure, unspecified: Secondary | ICD-10-CM

## 2018-06-20 DIAGNOSIS — R1901 Right upper quadrant abdominal swelling, mass and lump: Secondary | ICD-10-CM

## 2018-06-20 DIAGNOSIS — R945 Abnormal results of liver function studies: Secondary | ICD-10-CM

## 2018-06-20 DIAGNOSIS — I959 Hypotension, unspecified: Secondary | ICD-10-CM

## 2018-06-20 DIAGNOSIS — J9601 Acute respiratory failure with hypoxia: Secondary | ICD-10-CM

## 2018-06-20 DIAGNOSIS — R570 Cardiogenic shock: Secondary | ICD-10-CM

## 2018-06-20 DIAGNOSIS — R748 Abnormal levels of other serum enzymes: Secondary | ICD-10-CM

## 2018-06-20 LAB — BASIC METABOLIC PANEL
ANION GAP: 20 — AB (ref 5–15)
ANION GAP: 19 — AB (ref 5–15)
Anion gap: 20 — ABNORMAL HIGH (ref 5–15)
BUN: 61 mg/dL — AB (ref 8–23)
BUN: 71 mg/dL — AB (ref 8–23)
BUN: 82 mg/dL — ABNORMAL HIGH (ref 8–23)
CALCIUM: 7.8 mg/dL — AB (ref 8.9–10.3)
CALCIUM: 7.6 mg/dL — AB (ref 8.9–10.3)
CHLORIDE: 103 mmol/L (ref 98–111)
CO2: 13 mmol/L — AB (ref 22–32)
CO2: 14 mmol/L — ABNORMAL LOW (ref 22–32)
CO2: 22 mmol/L (ref 22–32)
CREATININE: 3.76 mg/dL — AB (ref 0.61–1.24)
CREATININE: 4.3 mg/dL — AB (ref 0.61–1.24)
Calcium: 8.1 mg/dL — ABNORMAL LOW (ref 8.9–10.3)
Chloride: 99 mmol/L (ref 98–111)
Chloride: 93 mmol/L — ABNORMAL LOW (ref 98–111)
Creatinine, Ser: 4.63 mg/dL — ABNORMAL HIGH (ref 0.61–1.24)
GFR calc Af Amer: 17 mL/min — ABNORMAL LOW (ref >60)
GFR calc Af Amer: 15 mL/min — ABNORMAL LOW (ref >60)
GFR calc Af Amer: 14 mL/min — ABNORMAL LOW (ref >60)
GFR calc non Af Amer: 15 mL/min — ABNORMAL LOW (ref >60)
GFR, EST NON AFRICAN AMERICAN: 13 mL/min — AB (ref >60)
GFR, EST NON AFRICAN AMERICAN: 12 mL/min — AB (ref >60)
GLUCOSE: 128 mg/dL — AB (ref 70–99)
GLUCOSE: 167 mg/dL — AB (ref 70–99)
Glucose, Bld: 134 mg/dL — ABNORMAL HIGH (ref 70–99)
Potassium: 6.9 mmol/L (ref 3.5–5.1)
Potassium: 5.9 mmol/L — ABNORMAL HIGH (ref 3.5–5.1)
Potassium: 4.4 mmol/L (ref 3.5–5.1)
Sodium: 136 mmol/L (ref 135–145)
Sodium: 133 mmol/L — ABNORMAL LOW (ref 135–145)
Sodium: 134 mmol/L — ABNORMAL LOW (ref 135–145)

## 2018-06-20 LAB — BLOOD GAS, ARTERIAL
ACID-BASE DEFICIT: 10.2 mmol/L — AB (ref 0.0–2.0)
Bicarbonate: 14.9 mmol/L — ABNORMAL LOW (ref 20.0–28.0)
Drawn by: 398981
O2 Saturation: 93.1 %
PATIENT TEMPERATURE: 98.6
PCO2 ART: 31.2 mmHg — AB (ref 32.0–48.0)
PH ART: 7.301 — AB (ref 7.350–7.450)
pO2, Arterial: 78.6 mmHg — ABNORMAL LOW (ref 83.0–108.0)

## 2018-06-20 LAB — HIV ANTIBODY (ROUTINE TESTING W REFLEX): HIV Screen 4th Generation wRfx: NONREACTIVE

## 2018-06-20 LAB — URINALYSIS, ROUTINE W REFLEX MICROSCOPIC
BILIRUBIN URINE: NEGATIVE
Glucose, UA: NEGATIVE mg/dL
Ketones, ur: NEGATIVE mg/dL
LEUKOCYTES UA: NEGATIVE
Nitrite: NEGATIVE
PH: 5 (ref 5.0–8.0)
Protein, ur: NEGATIVE mg/dL
RBC / HPF: >50 RBC/hpf — ABNORMAL HIGH (ref 0–5)
SPECIFIC GRAVITY, URINE: 1.008 (ref 1.005–1.030)

## 2018-06-20 LAB — COOXEMETRY PANEL
CARBOXYHEMOGLOBIN: 0.5 % (ref 0.5–1.5)
Carboxyhemoglobin: 0.7 % (ref 0.5–1.5)
Carboxyhemoglobin: 1 % (ref 0.5–1.5)
METHEMOGLOBIN: 0.8 % (ref 0.0–1.5)
METHEMOGLOBIN: 1.6 % — AB (ref 0.0–1.5)
METHEMOGLOBIN: 1.1 % (ref 0.0–1.5)
O2 SAT: 40.1 %
O2 Saturation: 56.4 %
O2 Saturation: 60.5 %
TOTAL HEMOGLOBIN: 14.3 g/dL (ref 12.0–16.0)
TOTAL HEMOGLOBIN: 12.9 g/dL (ref 12.0–16.0)
TOTAL HEMOGLOBIN: 12.6 g/dL (ref 12.0–16.0)

## 2018-06-20 LAB — LIPID PANEL
CHOLESTEROL: 178 mg/dL (ref 0–200)
HDL: 30 mg/dL — ABNORMAL LOW (ref >40)
LDL Cholesterol: 121 mg/dL — ABNORMAL HIGH (ref 0–99)
Total CHOL/HDL Ratio: 5.9 RATIO
Triglycerides: 136 mg/dL (ref <150)
VLDL: 27 mg/dL (ref 0–40)

## 2018-06-20 LAB — NA AND K (SODIUM & POTASSIUM), RAND UR
Potassium Urine: 14 mmol/L
Sodium, Ur: 112 mmol/L

## 2018-06-20 LAB — TROPONIN I
Troponin I: 0.27 ng/mL (ref <0.03)
Troponin I: 0.32 ng/mL (ref <0.03)
Troponin I: 0.28 ng/mL (ref <0.03)

## 2018-06-20 LAB — PROTIME-INR
INR: 4.46
INR: 4.88 — AB
INR: 4.63 — AB
PROTHROMBIN TIME: 45.2 s — AB (ref 11.4–15.2)
Prothrombin Time: 42.1 seconds — ABNORMAL HIGH (ref 11.4–15.2)
Prothrombin Time: 43.4 seconds — ABNORMAL HIGH (ref 11.4–15.2)

## 2018-06-20 LAB — HEPARIN LEVEL (UNFRACTIONATED): Heparin Unfractionated: >2.2 IU/mL — ABNORMAL HIGH (ref 0.30–0.70)

## 2018-06-20 LAB — APTT: aPTT: 83 seconds — ABNORMAL HIGH (ref 24–36)

## 2018-06-20 LAB — HEMOGLOBIN A1C
HEMOGLOBIN A1C: 6.1 % — AB (ref 4.8–5.6)
MEAN PLASMA GLUCOSE: 128.37 mg/dL

## 2018-06-20 LAB — CBC
HEMATOCRIT: 47.3 % (ref 39.0–52.0)
Hemoglobin: 14.7 g/dL (ref 13.0–17.0)
MCH: 29.9 pg (ref 26.0–34.0)
MCHC: 31.1 g/dL (ref 30.0–36.0)
MCV: 96.3 fL (ref 78.0–100.0)
PLATELETS: 120 10*3/uL — AB (ref 150–400)
RBC: 4.91 MIL/uL (ref 4.22–5.81)
RDW: 17.2 % — ABNORMAL HIGH (ref 11.5–15.5)
WBC: 17.8 10*3/uL — AB (ref 4.0–10.5)

## 2018-06-20 LAB — LACTIC ACID, PLASMA
LACTIC ACID, VENOUS: 3.6 mmol/L — AB (ref 0.5–1.9)
LACTIC ACID, VENOUS: 2.2 mmol/L — AB (ref 0.5–1.9)
LACTIC ACID, VENOUS: 1.8 mmol/L (ref 0.5–1.9)
Lactic Acid, Venous: 1.4 mmol/L (ref 0.5–1.9)
Lactic Acid, Venous: 5.4 mmol/L (ref 0.5–1.9)

## 2018-06-20 LAB — PROCALCITONIN: Procalcitonin: 2.82 ng/mL

## 2018-06-20 LAB — CREATININE, URINE, RANDOM: Creatinine, Urine: 14.38 mg/dL

## 2018-06-20 MED ORDER — PATIROMER SORBITEX CALCIUM 8.4 G PO PACK
16.8000 g | PACK | Freq: Once | ORAL | Status: DC
  Filled 2018-06-20: qty 2

## 2018-06-20 MED ORDER — LEVALBUTEROL HCL 1.25 MG/0.5ML IN NEBU: 1.2500 mg | INHALATION_SOLUTION | RESPIRATORY_TRACT | Status: DC | PRN

## 2018-06-20 MED ORDER — HEPARIN (PORCINE) IN NACL 100-0.45 UNIT/ML-% IJ SOLN
INTRAMUSCULAR | Status: AC
  Filled 2018-06-20: qty 250

## 2018-06-20 MED ORDER — FUROSEMIDE 10 MG/ML IJ SOLN
160.0000 mg | Freq: Once | INTRAVENOUS | Status: AC
  Administered 2018-06-20: 160 mg via INTRAVENOUS
  Filled 2018-06-20: qty 16

## 2018-06-20 MED ORDER — AMIODARONE LOAD VIA INFUSION
150.0000 mg | Freq: Once | INTRAVENOUS | Status: AC
  Administered 2018-06-20: 150 mg via INTRAVENOUS
  Filled 2018-06-20: qty 83.34

## 2018-06-20 MED ORDER — SODIUM CHLORIDE 0.9 % IV SOLN
1.0000 g | INTRAVENOUS | Status: AC
  Administered 2018-06-20 – 2018-06-26 (×7): 1 g via INTRAVENOUS
  Filled 2018-06-20 (×7): qty 1

## 2018-06-20 MED ORDER — NOREPINEPHRINE 16 MG/250ML-% IV SOLN
0.0000 ug/min | INTRAVENOUS | Status: DC
  Administered 2018-06-20: 5 ug/min via INTRAVENOUS
  Filled 2018-06-20: qty 250

## 2018-06-20 MED ORDER — IPRATROPIUM BROMIDE 0.02 % IN SOLN
0.5000 mg | Freq: Four times a day (QID) | RESPIRATORY_TRACT | Status: DC
  Administered 2018-06-20 – 2018-06-21 (×3): 0.5 mg via RESPIRATORY_TRACT
  Filled 2018-06-20 (×5): qty 2.5

## 2018-06-20 MED ORDER — SODIUM CHLORIDE 0.9% IV SOLUTION: Freq: Once | INTRAVENOUS | Status: DC

## 2018-06-20 MED ORDER — SODIUM BICARBONATE 8.4 % IV SOLN
INTRAVENOUS | Status: AC
  Filled 2018-06-20: qty 50

## 2018-06-20 MED ORDER — MILRINONE LACTATE IN DEXTROSE 20-5 MG/100ML-% IV SOLN
0.1250 ug/kg/min | INTRAVENOUS | Status: DC
  Administered 2018-06-20 – 2018-06-22 (×5): 0.25 ug/kg/min via INTRAVENOUS
  Filled 2018-06-20 (×7): qty 100

## 2018-06-20 MED ORDER — STERILE WATER FOR INJECTION IV SOLN
INTRAVENOUS | Status: AC
  Administered 2018-06-20 (×2): via INTRAVENOUS
  Filled 2018-06-20 (×3): qty 850

## 2018-06-20 MED ORDER — HEPARIN (PORCINE) IN NACL 100-0.45 UNIT/ML-% IJ SOLN: 1200.0000 [IU]/h | INTRAMUSCULAR | Status: DC

## 2018-06-20 MED ORDER — SODIUM BICARBONATE 8.4 % IV SOLN
INTRAVENOUS | Status: AC
Start: 2018-06-20 — End: 2018-06-20
  Filled 2018-06-20: qty 50

## 2018-06-20 MED ORDER — AMIODARONE HCL IN DEXTROSE 360-4.14 MG/200ML-% IV SOLN
60.0000 mg/h | INTRAVENOUS | Status: DC
  Administered 2018-06-20 (×2): 60 mg/h via INTRAVENOUS
  Filled 2018-06-20 (×2): qty 200

## 2018-06-20 MED ORDER — ENOXAPARIN SODIUM 80 MG/0.8ML ~~LOC~~ SOLN: 80.0000 mg | SUBCUTANEOUS | Status: DC

## 2018-06-20 MED ORDER — HEPARIN (PORCINE) IN NACL 100-0.45 UNIT/ML-% IJ SOLN
1050.0000 [IU]/h | INTRAMUSCULAR | Status: DC
Start: 2018-06-20 — End: 2018-06-21
  Administered 2018-06-20: 1200 [IU]/h via INTRAVENOUS
  Administered 2018-06-21: 1050 [IU]/h via INTRAVENOUS
  Filled 2018-06-20: qty 250

## 2018-06-20 MED ORDER — FUROSEMIDE 10 MG/ML IJ SOLN
15.0000 mg/h | INTRAVENOUS | Status: DC
  Administered 2018-06-20: 8 mg/h via INTRAVENOUS
  Administered 2018-06-21 (×2): 20 mg/h via INTRAVENOUS
  Administered 2018-06-22 – 2018-06-23 (×2): 15 mg/h via INTRAVENOUS
  Filled 2018-06-20: qty 21
  Filled 2018-06-20 (×6): qty 25
  Filled 2018-06-20: qty 20

## 2018-06-20 MED ORDER — HEPARIN (PORCINE) IN NACL 100-0.45 UNIT/ML-% IJ SOLN
1200.0000 [IU]/h | INTRAMUSCULAR | Status: DC
  Administered 2018-06-20 (×2): 1200 [IU]/h via INTRAVENOUS

## 2018-06-20 MED ORDER — FUROSEMIDE 10 MG/ML IJ SOLN
8.0000 mg/h | INTRAVENOUS | Status: DC
  Filled 2018-06-20: qty 25

## 2018-06-20 MED ORDER — AMIODARONE HCL IN DEXTROSE 360-4.14 MG/200ML-% IV SOLN
30.0000 mg/h | INTRAVENOUS | Status: DC
  Administered 2018-06-20 – 2018-06-21 (×2): 30 mg/h via INTRAVENOUS
  Filled 2018-06-20 (×2): qty 200

## 2018-06-20 MED ORDER — SODIUM BICARBONATE 8.4 % IV SOLN
150.0000 meq | Freq: Once | INTRAVENOUS | Status: AC
  Administered 2018-06-20: 150 meq via INTRAVENOUS
  Filled 2018-06-20: qty 150

## 2018-06-20 MED ORDER — NOREPINEPHRINE 4 MG/250ML-% IV SOLN
0.0000 ug/min | INTRAVENOUS | Status: DC
  Filled 2018-06-20: qty 250

## 2018-06-20 NOTE — Progress Notes (Addendum)
ANTICOAGULATION CONSULT NOTE - Initial Consult  Pharmacy Consult for Apixaban>>>>Heparin  Indication: pulmonary embolus, afib  Allergies  Allergen Reactions  . Amiodarone Rash    Rash resolved when amio stopped 2013, retried in 2017 with same reaction    Patient Measurements: 82 kg, per pt  Vital Signs: Pulse Rate: 107 (06/25 2154)  Labs: Recent Labs    06/19/18 2128  CREATININE 3.58*  TROPONINI 0.27*    CrCl cannot be calculated (Unknown ideal weight.).   Medical History: Past Medical History:  Diagnosis Date  . Acute blood loss anemia    Due to rectus sheath hematoma 03/2012  . Acute renal failure (HCC)    03/2012 (not on ACEI due to this) - renal US unremarkable  . Atrial fibrillation (HCC)   . Cardiomyopathy (HCC)    Transient cardiomyopathy with EF 30-35% 03/27/12, improved to 55-60% 04/16/12  . CHF (congestive heart failure) (HCC)   . Essential hypertension, benign   . Hematuria 03/2012  . Hyperglycemia 03/2012  . Pleural effusion 03/2012  . Rectus sheath hematoma    Spontaneous during 03/2012 hospitalization managed conservatively with blood products  . Respiratory failure (HCC)    VDRF 03/2012 in setting of CHF  . Shock (HCC)    03/2012 hospitalization - Cardiogenic shock requiring pressors initially, followed by hemorrhagic shock later in hospitalization  . Unspecified sleep apnea    Suspected during 03/2012 hospitalization    Assessment: 72 y/o transfer from outside hospital with afib and PE, pt started on apixaban but currently unable to take PO, changing to Heparin, last dose apixaban 12 hours ago, starting Heparin now, will need to use aPTT to dose for now given Apixaban influence on anti-Xa levels.   Goal of Therapy: Heparin level 0.3-0.7 units/mL APTT 66-102 secs  Monitor platelets by anticoagulation protocol: Yes   Plan:  Start heparin at 1200 units/hr 0900 aPTT/HL  Abran Duke 06/20/2018,12:05 AM

## 2018-06-20 NOTE — Progress Notes (Signed)
CRITICAL VALUE ALERT  Critical Value:  INR 4.88  Date & Time Notied:  06/19/18 0530  Provider Notified: Pola Corn Dr. Arsenio Loader

## 2018-06-20 NOTE — Progress Notes (Signed)
Asked to evaluate patient in the afternoon.  Was evaluated, UOP remained negligible.  Spoke with nephrology.  Concern is that patient received eliquis and was on a heparin drip with a very elevated INR.  The patient is not intubated.  So concern is that if we have to place an HD catheter then will need to intubate patient as well as the bleeding risk.  Patient was started on a lasix drip and a bolus.   Returned back 2 hours later, the patient is actually starting to make urine with adequate BP support and lasix.  Does not appear to need to be intubated at this time.  Will not place HD catheter.  Night team will recheck, if needs intubation then will likely intubate then.  PCCM will continue to follow.  The patient is critically ill with multiple organ systems failure and requires high complexity decision making for assessment and support, frequent evaluation and titration of therapies, application of advanced monitoring technologies and extensive interpretation of multiple databases.   Critical Care Time devoted to patient care services described in this note is  45  Minutes. This time reflects time of care of this signee Dr [name]. This critical care time does not reflect procedure time, or teaching time or supervisory time of PA/NP/Med student/Med Resident etc but could involve care discussion time.  Alyson Reedy, M.D. [facility] Pulmonary/Critical Care Medicine. Pager: [phone]. After hours pager: [phone].

## 2018-06-20 NOTE — Consult Note (Addendum)
Advanced Heart Failure Team Consult Note    Primary Cardiologist:  Bensimhon Consulting MD: Melanie Crazier  Reason for Consult:  Recurrent AF. Shock   HPI:     Martin [name] is a 72 yo with PAF/FL, systolic HF due to NICM, HTN,previous rectus sheath hematoma who was transferred from [facility] due to cardiogenic shock in setting of recurrent AF. We are asked to see patient by Dr. Zane Herald to help manage AF and shock.   Has had several previous hospitalizations over the past 5-6 years for similar presentation where he develops AF/AFL with resultant tachy CM and cardiogenic shock. Most recently admitted 2/6 through 02/07/2017 with marked volume overload and A fib RVR EF down to 20-25% (from 55%.Underwent successful DC-CV on 02/03/17 and maintained NSR on IV amiodarone. Discussed with EP and considered for AVN ablation with CRT-P due to recurrent AF, each time leading to cardiogenic shock. Patient refused AVN ablation and CRT-P. He was discharged on eliquis 5 mg twice a day. He had previously refused AC due to previous large rectus sheath hematoma  Stopped his cardiac meds about 6 months ago. Says he began to feel weak and SOB several days ago. Became acutely worse on 6/24 went to [facility]. EF 20-25% with biventricular failure. Transferred here.   On arrival ABG showed pH 7.19, P CO2 26, PO2 83.7. Blood pressure SBP 110s. HR 110s, RR 35-45. Ptwas put on BiPAP.Patient has hyperkalemia with potassium of 6.1 and creatinine 3.8 (up from 1.2) with lactate 5.5 Started on bicarb gtt and given  5 unit of NovoLog and 50 cc ofD50 wereordered. 30 g ofKayexalate was also ordered.  Chest CT with moderate R effusion and ? Small RLL PE (I viewed personally and don't see it)  PCT 2.8  Says he is starting to feel better. Still weak but breathing better. In AFL 100-120   ECHO EF 20-25% 02/02/2017.   Labs (3/16): K 3.4, creatinine 2.34 Labs (04/01/2015) K 3.9 Creatinine 1.47  Labs (05/27/16) K 4.5,  Creatinine 2.15 Labs 06/06/2016: K 4.7 Creatinine 1.33 Labs 02/14/2017: K 4.5 Creatinine 1.34  Labs 02/22/2017: Creatinine 1.19     Review of Systems: [y] = yes, [ ]  = no   General: Weight gain Cove.Etienne ]; Weight loss [ ] ; Anorexia [ ] ; Fatigue [ y]; Fever [ ] ; Chills [ ] ; Weakness Cove.Etienne ]  Cardiac: Chest pain/pressure [ ] ; Resting SOB Cove.Etienne ]; Exertional SOB Cove.Etienne ]; Orthopnea Cove.Etienne ]; Pedal Edema Cove.Etienne ]; Palpitations [ ] ; Syncope [ ] ; Presyncope [ ] ; Paroxysmal nocturnal dyspnea[y ]  Pulmonary: Cough [ ] ; Wheezing[ ] ; Hemoptysis[ ] ; Sputum [ ] ; Snoring [ ]   GI: Vomiting[ ] ; Dysphagia[ ] ; Melena[ ] ; Hematochezia [ ] ; Heartburn[ ] ; Abdominal pain [ ] ; Constipation [ ] ; Diarrhea [ ] ; BRBPR [ ]   GU: Hematuria[ ] ; Dysuria [ ] ; Nocturia[ ]   Vascular: Pain in legs with walking [ ] ; Pain in feet with lying flat [ ] ; Non-healing sores [ ] ; Stroke [ ] ; TIA [ ] ; Slurred speech [ ] ;  Neuro: Headaches[ ] ; Vertigo[ ] ; Seizures[ ] ; Paresthesias[ ] ;Blurred vision [ ] ; Diplopia [ ] ; Vision changes [ ]   Ortho/Skin: Arthritis Cove.Etienne ]; Joint pain Cove.Etienne ]; Muscle pain [ ] ; Joint swelling [ ] ; Back Pain [ ] ; Rash [ ]   Psych: Depression[ ] ; Anxiety[ ]   Heme: Bleeding problems [ ] ; Clotting disorders [ ] ; Anemia [ ]   Endocrine: Diabetes [ ] ; Thyroid dysfunction[ ]   Home Medications Prior to Admission medications   Medication  Sig Start Date End Date Taking? Authorizing Provider  atorvastatin (LIPITOR) 80 MG tablet Take 1 tablet (80 mg total) by mouth daily at 6 PM. 05/27/16   Clegg, Amy D, NP  carvedilol (COREG) 3.125 MG tablet Take 1 tablet (3.125 mg total) by mouth 2 (two) times daily with a meal. 04/05/17   Clegg, Amy D, NP  furosemide (LASIX) 40 MG tablet Take 1 tablet (40 mg total) by mouth daily. Take an extra 40mg  (1 tablet) for weight 190lbs or greater 02/07/17   Graciella Freer, PA-C  losartan (COZAAR) 25 MG tablet Take 0.5 tablets (12.5 mg total) by mouth at bedtime. 02/07/17   Graciella Freer, PA-C  Multiple Vitamin  (MULTIVITAMIN WITH MINERALS) TABS tablet Take 1 tablet by mouth daily.    [provider]    Past Medical History: Past Medical History:  Diagnosis Date  . Acute blood loss anemia    Due to rectus sheath hematoma 03/2012  . Acute renal failure (HCC)    03/2012 (not on ACEI due to this) - renal US unremarkable  . Atrial fibrillation (HCC)   . Cardiomyopathy (HCC)    Transient cardiomyopathy with EF 30-35% 03/27/12, improved to 55-60% 04/16/12  . CHF (congestive heart failure) (HCC)   . Essential hypertension, benign   . Hematuria 03/2012  . Hyperglycemia 03/2012  . Pleural effusion 03/2012  . Rectus sheath hematoma    Spontaneous during 03/2012 hospitalization managed conservatively with blood products  . Respiratory failure (HCC)    VDRF 03/2012 in setting of CHF  . Shock (HCC)    03/2012 hospitalization - Cardiogenic shock requiring pressors initially, followed by hemorrhagic shock later in hospitalization  . Unspecified sleep apnea    Suspected during 03/2012 hospitalization     Past Surgical History: Past Surgical History:  Procedure Laterality Date  . CARDIOVERSION N/A 03/05/2015   Procedure: CARDIOVERSION;  Surgeon: Dolores Patty, MD;  Location: [facility] ENDOSCOPY;  Service: Cardiovascular;  Laterality: N/A;  . CARDIOVERSION N/A 05/20/2016   Procedure: CARDIOVERSION;  Surgeon: Dolores Patty, MD;  Location: [facility] OR;  Service: Cardiovascular;  Laterality: N/A;  . CARDIOVERSION N/A 05/25/2016   Procedure: CARDIOVERSION;  Surgeon: Vesta Mixer, MD;  Location: [facility] ENDOSCOPY;  Service: Cardiovascular;  Laterality: N/A;  . CARDIOVERSION N/A 02/03/2017   Procedure: CARDIOVERSION;  Surgeon: Dolores Patty, MD;  Location: [facility] ENDOSCOPY;  Service: Cardiovascular;  Laterality: N/A;  . TEE WITHOUT CARDIOVERSION N/A 03/05/2015   Procedure: TRANSESOPHAGEAL ECHOCARDIOGRAM (TEE);  Surgeon: Dolores Patty, MD;  Location: [facility] ENDOSCOPY;  Service: Cardiovascular;  Laterality: N/A;  . TEE  WITHOUT CARDIOVERSION N/A 05/20/2016   Procedure: TRANSESOPHAGEAL ECHOCARDIOGRAM (TEE);  Surgeon: Dolores Patty, MD;  Location: [facility] OR;  Service: Cardiovascular;  Laterality: N/A;  . TEE WITHOUT CARDIOVERSION N/A 02/03/2017   Procedure: TRANSESOPHAGEAL ECHOCARDIOGRAM (TEE);  Surgeon: Dolores Patty, MD;  Location: [facility] ENDOSCOPY;  Service: Cardiovascular;  Laterality: N/A;    Family History: Family History  Problem Relation Age of Onset  . Emphysema Father   . Heart disease Father   . Other Mother        died during a leg surgery  . Diabetes Brother   . Heart attack Brother     Social History: Social History   Socioeconomic History  . Marital status: Married    Spouse name: Not on file  . Number of children: Not on file  . Years of education: Not on file  . Highest education level: Not on  file  Occupational History  . Not on file  Social Needs  . Financial resource strain: Not on file  . Food insecurity:    Worry: Not on file    Inability: Not on file  . Transportation needs:    Medical: Not on file    Non-medical: Not on file  Tobacco Use  . Smoking status: Never Smoker  . Smokeless tobacco: Never Used  Substance and Sexual Activity  . Alcohol use: Yes    Comment: former  . Drug use: No  . Sexual activity: Yes  Lifestyle  . Physical activity:    Days per week: Not on file    Minutes per session: Not on file  . Stress: Not on file  Relationships  . Social connections:    Talks on phone: Not on file    Gets together: Not on file    Attends religious service: Not on file    Active member of club or organization: Not on file    Attends meetings of clubs or organizations: Not on file    Relationship status: Not on file  Other Topics Concern  . Not on file  Social History Narrative  . Not on file    Allergies:  Allergies  Allergen Reactions  . Amiodarone Rash    Rash resolved when amio stopped 2013, retried in 2017 with same reaction    Objective:      Vital Signs:   Temp:  [96.7 F (35.9 C)-97 F (36.1 C)] 97 F (36.1 C) (06/26 0830) Pulse Rate:  [106-121] 120 (06/26 0900) Resp:  [16-36] 27 (06/26 0900) BP: (100-114)/(67-99) 111/81 (06/26 0900) SpO2:  [91 %-100 %] 100 % (06/26 0900) FiO2 (%):  [40 %] 40 % (06/25 2332) Weight:  [92.2 kg (203 lb 4.2 oz)] 92.2 kg (203 lb 4.2 oz) (06/26 0300) Last BM Date: 06/19/18 Filed Weights   06/20/18 0300  Weight: 92.2 kg (203 lb 4.2 oz)    Physical Exam: General:  Lying in bed Ill appearing. No resp difficulty HEENT: normal Neck: supple. JVP to ear . Carotids 2+ bilat; no bruits. No lymphadenopathy or thryomegaly appreciated. Cor: PMI laterally displaced. Tacy regular +s3 Lungs: dulla t right base Abdomen: soft, nontender, + distended. No hepatosplenomegaly. No bruits or masses. Good bowel sounds. Chronic hematoma on right side Extremities: no cyanosis, clubbing, rash, 2-3+ edema mildly mottled Neuro: alert & orientedx3, cranial nerves grossly intact. moves all 4 extremities w/o difficulty. Affect pleasant  Telemetry:  AFL vs atach 100-120 Personally reviewed   Labs: Basic Metabolic Panel: Recent Labs  Lab 06/19/18 2128 06/20/18 0113  NA 134* 136  K 6.1* 6.9*  CL 102 103  CO2 8* 13*  GLUCOSE 148* 134*  BUN 59* 61*  CREATININE 3.58* 3.76*  CALCIUM 8.2* 8.1*    Liver Function Tests: No results for input(s): AST, ALT, ALKPHOS, BILITOT, PROT, ALBUMIN in the last 168 hours. No results for input(s): LIPASE, AMYLASE in the last 168 hours. No results for input(s): AMMONIA in the last 168 hours.  CBC: Recent Labs  Lab 06/20/18 0113  WBC 17.8*  HGB 14.7  HCT 47.3  MCV 96.3  PLT 120*    Cardiac Enzymes: Recent Labs  Lab 06/19/18 2128 06/20/18 0420  TROPONINI 0.27* 0.27*    BNP: BNP (last 3 results) Recent Labs    06/19/18 2128  BNP 1,948.0*    ProBNP (last 3 results) No results for input(s): PROBNP in the last 8760 hours.   CBG: No results  for  input(s): GLUCAP in the last 168 hours.  Coagulation Studies: Recent Labs    06/20/18 0113 06/20/18 0420  LABPROT 42.1* 45.2*  INR 4.46* 4.88*    Other results: EKG:A trial tach vs atypical AFL  Imaging: Dg Chest Port 1 View  Result Date: 06/19/2018 CLINICAL DATA:  Dyspnea EXAM: PORTABLE CHEST 1 VIEW COMPARISON:  06/19/2018 chest radiograph. FINDINGS: Stable cardiomediastinal silhouette with mild cardiomegaly. No pneumothorax. Small right pleural effusion, stable. No left pleural effusion. No overt pulmonary edema. Mild bibasilar atelectasis. IMPRESSION: 1. Stable mild cardiomegaly without overt pulmonary edema. 2. Stable small right pleural effusion and mild bibasilar atelectasis. Electronically Signed   By: Delbert Phenix M.D.   On: 06/19/2018 22:02         Assessment:   Mr. Eppinger is a 72 yo with PAF/FL, systolic HF due to NICM, HTN,previous rectus sheath hematoma who was transferred from [facility] due to cardiogenic shock in setting of recurrent AF.    Plan/Discussion:    1. Cardiogenic shock, recurrent - EF 20-25% by Echo at [facility] - Likely NICM due to tachy-induced CM which is recurrent for him  - Central access placed with co-ox 40% - Start milrinone and IV lasix. May need norepi. - Once electrolytes stabilized will need DC-CV to restore NSR as he can never tolerate AFL/A tach. This will not be elective as he will not get out of shock without it so will not need TEE - Continue heparin  - No ACE/ARB with AKI. No bblocker  2. AKI - Due to #1 -  Inotrope support. High-dose lasix - May need CVVHD  3. Acute respiratory failure - Improving but tenuous. PCCM following  4. Paroxysmal AFL/atiral tach - Recurrent. Management as above. - Start amio   5. Moderate right pleural effusion with elevated PCT - no frank symptoms of infection but CT appearance concerning - start cefipime. Draw cultures.  - will eventually need tap   6. Rectus sheath  hematoma - chronic. No change  7. Pulmonary embolus - I have reviewed personally doubt this is present   CRITICAL CARE Performed by: [name]  Total critical care time: 45 minutes  Critical care time was exclusive of separately billable procedures and treating other patients.  Critical care was necessary to treat or prevent imminent or life-threatening deterioration.  Critical care was time spent personally by me (independent of midlevel providers or residents) on the following activities: development of treatment plan with patient and/or surrogate as well as nursing, discussions with consultants, evaluation of patient's response to treatment, examination of patient, obtaining history from patient or surrogate, ordering and performing treatments and interventions, ordering and review of laboratory studies, ordering and review of radiographic studies, pulse oximetry and re-evaluation of patient's condition.   Length of Stay: 1 [name] MD 06/20/2018, 9:29 AM  Advanced Heart Failure Team Pager [phone] (M-F; 7a - 4p)  Please contact [facility] for night-coverage after hours (4p -7a ) and weekends on [email or web address]

## 2018-06-20 NOTE — Progress Notes (Signed)
Attempted to call patient's wife to inform her that patient was being transferred to the ICU. There was no answer at either phone number and no option to leave a voicemail.

## 2018-06-20 NOTE — Progress Notes (Signed)
*  PRELIMINARY RESULTS* Vascular Ultrasound Bilateral Lower Extremity Venous has been completed.  Preliminary findings: No Evidence of deep vein thrombosis in the lower extremity bilaterally. No evidence of bakers cyst bilaterally.   Leta Jungling M 06/20/2018, 10:25 AM

## 2018-06-20 NOTE — Progress Notes (Signed)
Most recent BMP with K 6.9 was drawn just prior to insulin/D50 and Bicarb being given. Patient very thirsty drinking a lot of water and saying how good our water is here. Will give some gentle IVF's and allow patient to continue to orally rehydrate. Repeat BMP with morning labs.

## 2018-06-20 NOTE — H&P (Signed)
History and Physical    Martin [name] BJY:[id number] DOB: 25-Jun-1946 DOA: 06/19/2018  Referring MD/NP/PA:   PCP: Patient, No Pcp Per   Patient coming from:  The patient is coming from home.  At baseline, pt is independent for most of ADL.    Chief Complaint: SOB  HPI: Martin [name] is a 72 y.o. male with medical history significant of CHF with EF of 15%, atrial fibrillation not taking AC, hyperlipidemia,, OSA, CKD 3, rectal sheath hematoma, right sided abdominal mass due to hx of bleeding 2/2 blood thinner use, who was transferred from [facility] due to SOB.  Pt did not take his lasix and blood thinner for a while and developed shortness of breath for 3 days. He was initially seen in [facility].  Patient was found to have right lower lobe pulmonary embolism without evidence of right heart strain by CT angiogram. Pt was given one dose of Eliquis in AM. He also has atrial fibrillation with RVR with heart rate up to 165. Cardizem was started. Patient was hypotensive initially per report.  Denies chest pain.  They consulted cardiology, Dr. Eden Emms and pt is transferred to [facility] hospital for further evaluation and treatment. At pt arrival, I was called to see pt ASAP since pt is in respiratory distress.  I quickly evaluated pt. Pt is in acute respiratory distress. Patient seem to be uncomfortable.  Patient states that he has a dry cough, but no chest pain, no fever or chills.  He denies nausea, vomiting, diarrhea, abdominal pain, symptoms of UTI or unilateral weakness.  Stat ABG showed pH 7.19, P CO2 26, PO2 83.7. Blood pressure is at SBP 110s. HR 110s, RR 35-45. Pt is put on BiPAP.  Patient has hyperkalemia with potassium of 6.1.  5 unit of NovoLog and 50 cc of D50 were ordered.  30 g of Kayexalate was also ordered.  ED Course in [facility] hospital: pt was found to have troponin 0.06, proBNP 10900, negative urinalysis, potassium 5.3, worsening renal function, WBC 9.6,  lactic acid 3.5 d-dimer 3.82.  Review of Systems:   General: no fevers, chills, has poor appetite, has fatigue HEENT: no blurry vision, hearing changes or sore throat Respiratory: has dyspnea, coughing, wheezing CV: no chest pain, no palpitations GI: no nausea, vomiting, abdominal pain, diarrhea, constipation. Has right sided abdominal mass GU: no dysuria, burning on urination, increased urinary frequency, hematuria  Ext: has leg edema Neuro: no unilateral weakness, numbness, or tingling, no vision change or hearing loss Skin: no rash, no skin tear. MSK: No muscle spasm, no deformity, no limitation of range of movement in spin Heme: No easy bruising.  Travel history: No recent long distant travel.  Allergy:  Allergies  Allergen Reactions  . Amiodarone Rash    Rash resolved when amio stopped 2013, retried in 2017 with same reaction    Past Medical History:  Diagnosis Date  . Acute blood loss anemia    Due to rectus sheath hematoma 03/2012  . Acute renal failure (HCC)    03/2012 (not on ACEI due to this) - renal US unremarkable  . Atrial fibrillation (HCC)   . Cardiomyopathy (HCC)    Transient cardiomyopathy with EF 30-35% 03/27/12, improved to 55-60% 04/16/12  . CHF (congestive heart failure) (HCC)   . Essential hypertension, benign   . Hematuria 03/2012  . Hyperglycemia 03/2012  . Pleural effusion 03/2012  . Rectus sheath hematoma    Spontaneous during 03/2012 hospitalization managed conservatively with blood  products  . Respiratory failure (HCC)    VDRF 03/2012 in setting of CHF  . Shock (HCC)    03/2012 hospitalization - Cardiogenic shock requiring pressors initially, followed by hemorrhagic shock later in hospitalization  . Unspecified sleep apnea    Suspected during 03/2012 hospitalization     Past Surgical History:  Procedure Laterality Date  . CARDIOVERSION N/A 03/05/2015   Procedure: CARDIOVERSION;  Surgeon: Dolores Patty, MD;  Location: [facility] ENDOSCOPY;  Service:  Cardiovascular;  Laterality: N/A;  . CARDIOVERSION N/A 05/20/2016   Procedure: CARDIOVERSION;  Surgeon: Dolores Patty, MD;  Location: [facility] OR;  Service: Cardiovascular;  Laterality: N/A;  . CARDIOVERSION N/A 05/25/2016   Procedure: CARDIOVERSION;  Surgeon: Vesta Mixer, MD;  Location: [facility] ENDOSCOPY;  Service: Cardiovascular;  Laterality: N/A;  . CARDIOVERSION N/A 02/03/2017   Procedure: CARDIOVERSION;  Surgeon: Dolores Patty, MD;  Location: [facility] ENDOSCOPY;  Service: Cardiovascular;  Laterality: N/A;  . TEE WITHOUT CARDIOVERSION N/A 03/05/2015   Procedure: TRANSESOPHAGEAL ECHOCARDIOGRAM (TEE);  Surgeon: Dolores Patty, MD;  Location: [facility] ENDOSCOPY;  Service: Cardiovascular;  Laterality: N/A;  . TEE WITHOUT CARDIOVERSION N/A 05/20/2016   Procedure: TRANSESOPHAGEAL ECHOCARDIOGRAM (TEE);  Surgeon: Dolores Patty, MD;  Location: [facility] OR;  Service: Cardiovascular;  Laterality: N/A;  . TEE WITHOUT CARDIOVERSION N/A 02/03/2017   Procedure: TRANSESOPHAGEAL ECHOCARDIOGRAM (TEE);  Surgeon: Dolores Patty, MD;  Location: [facility] ENDOSCOPY;  Service: Cardiovascular;  Laterality: N/A;    Social History:  reports that he has never smoked. He has never used smokeless tobacco. He reports that he drinks alcohol. He reports that he does not use drugs.  Family History:  Family History  Problem Relation Age of Onset  . Emphysema Father   . Heart disease Father   . Other Mother        died during a leg surgery  . Diabetes Brother   . Heart attack Brother      Prior to Admission medications   Medication Sig Start Date End Date Taking? Authorizing Provider  atorvastatin (LIPITOR) 80 MG tablet Take 1 tablet (80 mg total) by mouth daily at 6 PM. 05/27/16   Clegg, Amy D, NP  carvedilol (COREG) 3.125 MG tablet Take 1 tablet (3.125 mg total) by mouth 2 (two) times daily with a meal. 04/05/17   Clegg, Amy D, NP  furosemide (LASIX) 40 MG tablet Take 1 tablet (40 mg total) by mouth daily. Take an extra 40mg  (1  tablet) for weight 190lbs or greater 02/07/17   Graciella Freer, PA-C  losartan (COZAAR) 25 MG tablet Take 0.5 tablets (12.5 mg total) by mouth at bedtime. 02/07/17   Graciella Freer, PA-C  Multiple Vitamin (MULTIVITAMIN WITH MINERALS) TABS tablet Take 1 tablet by mouth daily.    [provider]    Physical Exam: Vitals:   06/20/18 0400 06/20/18 0417 06/20/18 0500 06/20/18 0547  BP:  107/89 103/80   Pulse:   (!) 116 (!) 119  Resp: (!) 26 (!) 26 (!) 22 (!) 31  SpO2: 95% 92% 92% 98%  Weight:       General: in moderate acute respiratory distress HEENT:       Eyes: PERRL, EOMI, no scleral icterus.       ENT: No discharge from the ears and nose, no pharynx injection, no tonsillar enlargement.        Neck: No JVD, no bruit, no mass felt. Heme: No neck lymph node enlargement. Cardiac: S1/S2, RRR, No murmurs, No gallops  or rubs. Respiratory: has wheezing bilaterally. GI: Has a big mass in right sided abdomen (pt states that he this mass is bleeding 2/2 blood thinner use in 2013. This is a chronic issue) Ext: 1+ leg edema. 1+DP/PT pulse bilaterally.  Lower extremities clammy. GU: No hematuria Musculoskeletal: No joint deformities, No joint redness or warmth, no limitation of ROM in spin. Skin: No rashes.  Neuro: Alert, oriented X3, cranial nerves II-XII grossly intact, moves all extremities normally.  Psych: Patient is not psychotic, no suicidal or hemocidal ideation.  Labs on Admission: I have personally reviewed following labs and imaging studies  CBC: Recent Labs  Lab 06/20/18 0113  WBC 17.8*  HGB 14.7  HCT 47.3  MCV 96.3  PLT 120*   Basic Metabolic Panel: Recent Labs  Lab 06/19/18 2128 06/20/18 0113  NA 134* 136  K 6.1* 6.9*  CL 102 103  CO2 8* 13*  GLUCOSE 148* 134*  BUN 59* 61*  CREATININE 3.58* 3.76*  CALCIUM 8.2* 8.1*   GFR: CrCl cannot be calculated (Unknown ideal weight.). Liver Function Tests: No results for input(s): AST, ALT,  ALKPHOS, BILITOT, PROT, ALBUMIN in the last 168 hours. No results for input(s): LIPASE, AMYLASE in the last 168 hours. No results for input(s): AMMONIA in the last 168 hours. Coagulation Profile: Recent Labs  Lab 06/20/18 0113 06/20/18 0420  INR 4.46* 4.88*   Cardiac Enzymes: Recent Labs  Lab 06/19/18 2128  TROPONINI 0.27*   BNP (last 3 results) No results for input(s): PROBNP in the last 8760 hours. HbA1C: Recent Labs    06/20/18 0113  HGBA1C 6.1*   CBG: No results for input(s): GLUCAP in the last 168 hours. Lipid Profile: Recent Labs    06/20/18 0113  CHOL 178  HDL 30*  LDLCALC 121*  TRIG 136  CHOLHDL 5.9   Thyroid Function Tests: No results for input(s): TSH, T4TOTAL, FREET4, T3FREE, THYROIDAB in the last 72 hours. Anemia Panel: No results for input(s): VITAMINB12, FOLATE, FERRITIN, TIBC, IRON, RETICCTPCT in the last 72 hours. Urine analysis:    Component Value Date/Time   COLORURINE YELLOW 03/02/2015 1450   APPEARANCEUR CLEAR 03/02/2015 1450   LABSPEC 1.010 03/02/2015 1450   PHURINE 5.0 03/02/2015 1450   GLUCOSEU NEGATIVE 03/02/2015 1450   HGBUR LARGE (A) 03/02/2015 1450   BILIRUBINUR NEGATIVE 03/02/2015 1450   KETONESUR NEGATIVE 03/02/2015 1450   PROTEINUR 30 (A) 03/02/2015 1450   UROBILINOGEN 1.0 03/02/2015 1450   NITRITE NEGATIVE 03/02/2015 1450   LEUKOCYTESUR NEGATIVE 03/02/2015 1450   Sepsis Labs: @LABRCNTIP (procalcitonin:4,lacticidven:4) ) Recent Results (from the past 240 hour(s))  MRSA PCR Screening     Status: None   Collection Time: 06/19/18  9:51 PM  Result Value Ref Range Status   MRSA by PCR NEGATIVE NEGATIVE Final    Comment:        The GeneXpert MRSA Assay (FDA approved for NASAL specimens only), is one component of a comprehensive MRSA colonization surveillance program. It is not intended to diagnose MRSA infection nor to guide or monitor treatment for MRSA infections. Performed at [facility], [address],  [place], [place] [address]      Radiological Exams on Admission: Dg Chest Port 1 View  Result Date: 06/19/2018 CLINICAL DATA:  Dyspnea EXAM: PORTABLE CHEST 1 VIEW COMPARISON:  06/19/2018 chest radiograph. FINDINGS: Stable cardiomediastinal silhouette with mild cardiomegaly. No pneumothorax. Small right pleural effusion, stable. No left pleural effusion. No overt pulmonary edema. Mild bibasilar atelectasis. IMPRESSION: 1. Stable mild cardiomegaly  without overt pulmonary edema. 2. Stable small right pleural effusion and mild bibasilar atelectasis. Electronically Signed   By: Delbert Phenix M.D.   On: 06/19/2018 22:02     EKG: I repeated EKG and reviewed it independently at pt's arrival to floor.  Regular rhythm, QTC 451, low voltage, nonspecific T wave change.    Assessment/Plan Principal Problem:   Acute respiratory failure with hypoxia (HCC) Active Problems:   Acute renal failure superimposed on stage 3 chronic kidney disease (HCC)   Atrial fibrillation with RVR (HCC)   Hyperkalemia   Acute pulmonary embolism (HCC)   HLD (hyperlipidemia)   Abnormal LFTs   Metabolic acidosis   Elevated troponin   Acute on chronic systolic (congestive) heart failure (HCC)   PE (pulmonary thromboembolism) (HCC)   Acute respiratory failure with hypoxia (HCC): Likely due to combination of bronchospasm given wheezing on auscultation, new exacerbation given elevated BNP PE and possible CHF. PCCM and card were consulted. -will admit to SUD inpt. -Atrovent nebulizer, as needed Xopenex nebulizer, Mucinex for cough -BiPAP -IV heparin for PE -IV Lasix per cardiology -repeat CXR  PE: CTA showed right lower lobe pulmonary embolism without evidence of right heart strain. -IV heparin -check LE doppler for DVT  Acute renal failure superimposed on stage 3 chronic kidney disease (HCC): Baseline creatinine 1.2-1.4.  His creatinine is 1.84 in [facility]. Likely due to the use of ARB, diuretics. - Follow  up renal function by BMP - Hold losartan - US-abdomen was ordered PCCM  Atrial fibrillation with RVR [facility]): Current heart rate is irregular. HR is 110s. Cardizem drip is on hold. -prn cardizem. -f/u card's recommendations  Hyperkalemia: k=5.3-->6.1-->6/9.  -50 ml of D50 and 5 U of novology -30 g of Kayexalate per rectum -3 amps HCO3 per PCCM. Might need HCO3 drip per PCCM.  HLD (hyperlipidemia): -hold Liptor due to abnormal liver function.  Abnormal LFTs: AST was 72, ALT 225, total bilirubin 1.1, ALP 130.  May be due to congestion secondary to CHF. -abdominal US was ordered by PCCM -check hepatitis panel -Hold Lipitor.  - Avoid Tylenol  Metabolic acidosis: Need 11.6, anion gap 27.  Likely due to lactic acidosis.  Lactic acid was 3.5.  Possibly due to hypoperfusion given hypotension reported.  Leukocytosis.  Does not seem to have infection or sepsis. -Trend lactic acid  Elevated troponin: trop 0.06 in another hospital. 0.27 on repeated test here. No CP.  Possibly due to demand ischemia. - cycle CE q6 x3 and repeat EKG in the am  - prn Morphine - will not give aspirin since pt had Eliquis - hold lipitor due to abnormal liver function - Risk factor stratification: will check FLP and A1C  - 2d echo was done in [facility] hospital  Acute on chronic systolic (congestive) heart failure [facility]): pt has elevated BNP 1946, bilateral leg edema, indicating possible CHF exacerbation. -one dose of Lasix 120 mg by IV per Dr. Means-->d/ced per PCCM -trop x 3 -2d echo was done in [facility] -Risk factor stratification: A1c, FLP -Daily weights    DVT ppx: On IV Heparin   Family Communication: None at bed side.   Disposition Plan:  Anticipate discharge back to previous home environment Consults called:  PCCM and Card, Dr. Charlestine Night Admission status: SUD/inpt-->ICU    Date of Service 06/20/2018    Lorretta Harp [facility] Pager [phone]  If 7PM-7AM, please contact  night-coverage [email or web address] Password [facility] 06/20/2018, 5:48 AM

## 2018-06-20 NOTE — Progress Notes (Addendum)
PULMONARY / CRITICAL CARE MEDICINE  Name: Martin [name] MRN: [id number] DOB: 06-Mar-1946    ADMISSION DATE:  06/19/2018 CONSULTATION DATE:  06/19/18  REFERRING MD :  Blaine Hamper  CHIEF COMPLAINT:  SOB   BRIEF SUMMARY:  Martin [name] is a 72 y.o. male with a PMH as outlined below including but not limited to NICM, sCHF (EF 20% with biventricular failure from ECHO at [facility] 6/24), A.fib/flutter not on anticoagulation (pt preference due to hx large rectus sheath hematoma).  He presented to [facility] 6/24 with fluid accumulation and SOB after being out of his medication for a couple of weeks.  While there, he was found to have AFRVR and was treated with IV diltiazem.  He also had CTA due to elevated D-dimer.  CTA demonstrated RLL PE.  He was subsequently started on eliquis.  Per pt, he was playing lottery and then went to a party on 6/24 and was feeling fine, then suddenly had SOB, tachycardia, tachypnea.  Night of 6/24, he remained tachycardic and had worsening dyspnea.  He was therefore transferred to [facility] for further evaluation and management.  He was admitted by [facility].  He had tachypnea; therefore, ABG was obtained which demonstrated met + resp acidosis (7.19 / 26 / 83 / 9).  He was placed on BiPAP and PCCM was then called in consultation due to concerns of ABG and possibility of deterioration.   SUBJECTIVE:   Pt denies SOB, pain, n/v/d, fevers/chills.  States SOB is what brought him into the hospital but he feels much better.  ST/AT 1-teens on monitor.  Afebrile / WBC 17.8.    VITAL SIGNS: Temp:  [96.7 F (35.9 C)] 96.7 F (35.9 C) (06/26 0815) Pulse Rate:  [106-121] 118 (06/26 0815) Resp:  [16-36] 16 (06/26 0815) BP: (100-112)/(67-99) 103/81 (06/26 0815) SpO2:  [91 %-100 %] 100 % (06/26 0730) FiO2 (%):  [40 %] 40 % (06/25 2332) Weight:  [203 lb 4.2 oz (92.2 kg)] 203 lb 4.2 oz (92.2 kg) (06/26 0300)  PHYSICAL EXAMINATION: General: chronically ill appearing male in NAD HEENT: MM  pink/dry, jvd + Neuro: AAOx4, speech clear, MAE  CV: s1s2 rrr, tachy, no m/r/g PULM: even/non-labored, lungs bilaterally clear anterior, diminished RLL  MW:NUUV, non-tender, bsx4 active  Extremities: warm/dry, trace to 1+ pitting BLE edema  Skin: no rashes or lesions   Recent Labs  Lab 06/19/18 2128 06/20/18 0113  NA 134* 136  K 6.1* 6.9*  CL 102 103  CO2 8* 13*  BUN 59* 61*  CREATININE 3.58* 3.76*  GLUCOSE 148* 134*   Recent Labs  Lab 06/20/18 0113  HGB 14.7  HCT 47.3  WBC 17.8*  PLT 120*   Dg Chest Port 1 View  Result Date: 06/19/2018 CLINICAL DATA:  Dyspnea EXAM: PORTABLE CHEST 1 VIEW COMPARISON:  06/19/2018 chest radiograph. FINDINGS: Stable cardiomediastinal silhouette with mild cardiomegaly. No pneumothorax. Small right pleural effusion, stable. No left pleural effusion. No overt pulmonary edema. Mild bibasilar atelectasis. IMPRESSION: 1. Stable mild cardiomegaly without overt pulmonary edema. 2. Stable small right pleural effusion and mild bibasilar atelectasis. Electronically Signed   By: Ilona Sorrel M.D.   On: 06/19/2018 22:02    STUDIES:  CT chest 6/25 > RLL PE, large right pleural effusion with atelectasis. CXR 6/25 > cardiomegaly, small right effusion, mild atx. Renal US 6/25 >  [place] Abd 6/26 >   SIGNIFICANT EVENTS  6/24 Admitted to [facility]. 6/25 Transferred to [facility].  ANTIBIOTICS Cefepime 6/26 >>   CULTURES BCx2  6/26 >>  UA 6/26 >>  Sputum 6/26 >>   ASSESSMENT / PLAN:  Tachypnea - compensatory given metabolic acidosis.  Adequate compensation (per [place], expected pCO2 should be in 18 - 22 range). P: Supportive care O2 as needed for sats > 90% If WOB continues a such then will intubate  RLL PE - on CT chest from [facility] 6/24. P: Hold heparin gtt for central line per Dr. Haroldine Laws  Follow up LE venous doppler  Anticoagulation per cards  Right pleural effusion - presumed transudative in setting CHF. P: Consider thoracentesis  6/27 but will hold off for now Follow up coags.  Wait until 6/27 to allow eliquis time to clear  Follow up CXR in am  AGMA - presumed due to renal failure. ? Cardiogenic shock. Hyponatremia. Hyperkalemia - likely driven by acidosis + renal failure.  S/p temporizing measures. AoCKD. P: Sodium bicarbonate gtt at 8m/hr May need renal consult ?CRRT Repeat BMP now given hyperkalemia.  Apparently did not receive kayexalate that was ordered.  Shock - suspected cardiogenic but rule out infectious component  AoC [facility]% and biventricular failure). Troponin bump - suspect demand. Hx A.fib / flutter (not on anticoagulation due to pt preference given hx rectal sheath hematoma), NICM. P: ICU monitoring  CVP Q4  Trend lactate, co-ox Milrinone,levophed per CHF service  Follow troponin, BNP  Pan culture with elevated PCT, empiric cefepime for possible infectious component   Abdominal mass - pt attributes to rectal sheath hematoma (though this was 6 years ago). P: Follow up abdominal [place]  BNoe Gens NP-C [facility] Pgr: [phone] or if no answer [phone]/26/2019, 8:31 AM  Attending Note:  72year old male with extensive PMH to include heart failure, metabolic acidosis, fluid overload, renal failure and now evolving respiratory failure with mixed cardiac and renal concerns.  On exam, sluggish but interactive, moving all ext to command with very high JVDs, diffuse edema and pulmonary edema on exam.  I reviewed CXR myself, pulmonary edema noted and TLC is in normal position.  Discussed with CHF team.  Will hold of intubation for now, will ask renal to see patient, start lasix drip and levophed drip.  If fails above measures then will intubate and mechanically ventilate.  PCCM will continue to follow and will come back in the after noon to evaluate.  The patient is critically ill with multiple organ systems failure and requires  high complexity decision making for assessment and support, frequent evaluation and titration of therapies, application of advanced monitoring technologies and extensive interpretation of multiple databases.   Critical Care Time devoted to patient care services described in this note is  45  Minutes. This time reflects time of care of this signee Dr WJennet Maduro This critical care time does not reflect procedure time, or teaching time or supervisory time of PA/NP/Med student/Med Resident etc but could involve care discussion time.  WRush Farmer M.D. [facility]. Pager: [phone] After hours pager: [phone]

## 2018-06-20 NOTE — Progress Notes (Signed)
Gave report to [facility] 2 heart

## 2018-06-20 NOTE — Consult Note (Signed)
PULMONARY / CRITICAL CARE MEDICINE  Name: Martin [name] MRN: [id number] DOB: 1946/10/29    ADMISSION DATE:  06/19/2018 CONSULTATION DATE:  06/19/18  REFERRING MD :  Blaine Hamper  CHIEF COMPLAINT:  SOB   HISTORY OF PRESENT ILLNESS:  Martin [name] is a 72 y.o. male with a PMH as outlined below including but not limited to NICM, sCHF (EF 20% with biventricular failurefrom echo at [facility] 6/24), A.fib/flutter not on anticoagulation (pt preference due to hx large rectus sheath hematoma).  He presented to [facility] 6/24 with fluid accumulation and SOB after being out of his medication for a couple of weeks.  While there, he was found to have AFRVR and was treated with IV diltiazem.  He also had CTA due to elevated D-dimer.  CTA demonstrated RLL PE.  He was subsequently started on eliquis. Per pt, he was playing lottery and then went to a party on 6/24 and was feeling fine, then suddenly had SOB, tachycardia, tachypnea.  Night of 6/24, he remained tachycardic and had worsening dyspnea.  He was therefore transferred to [facility] for further evaluation and management.  He was admitted by [facility].  He had tachypnea; therefore, ABG was obtained which demonstrated met + resp acidosis (7.19 / 26 / 83 / 9).  He was placed on BiPAP and PCCM was then called in consultation due to concerns of ABG and possibility of deterioration.   PAST MEDICAL HISTORY :   has a past medical history of Acute blood loss anemia, Acute renal failure ([place]), Atrial fibrillation ([place]), Cardiomyopathy ([place]), CHF (congestive heart failure) ([place]), Essential hypertension, benign, Hematuria (03/2012), Hyperglycemia (03/2012), Pleural effusion (03/2012), Rectus sheath hematoma, Respiratory failure ([place]), Shock ([place]), and Unspecified sleep apnea.  has a past surgical history that includes TEE without cardioversion (N/A, 03/05/2015); Cardioversion (N/A, 03/05/2015); Cardioversion (N/A, 05/20/2016); TEE without cardioversion (N/A, 05/20/2016); Cardioversion  (N/A, 05/25/2016); TEE without cardioversion (N/A, 02/03/2017); and Cardioversion (N/A, 02/03/2017). Prior to Admission medications   Medication Sig Start Date End Date Taking? Authorizing Provider  atorvastatin (LIPITOR) 80 MG tablet Take 1 tablet (80 mg total) by mouth daily at 6 PM. 05/27/16   Clegg, Amy D, NP  carvedilol (COREG) 3.125 MG tablet Take 1 tablet (3.125 mg total) by mouth 2 (two) times daily with a meal. 04/05/17   Clegg, Amy D, NP  furosemide (LASIX) 40 MG tablet Take 1 tablet (40 mg total) by mouth daily. Take an extra '40mg'$  (1 tablet) for weight 190lbs or greater 02/07/17   Shirley Friar, PA-C  losartan (COZAAR) 25 MG tablet Take 0.5 tablets (12.5 mg total) by mouth at bedtime. 02/07/17   Shirley Friar, PA-C  Multiple Vitamin (MULTIVITAMIN WITH MINERALS) TABS tablet Take 1 tablet by mouth daily.    [provider]   Allergies  Allergen Reactions  . Amiodarone Rash    Rash resolved when amio stopped 2013, retried in 2017 with same reaction    FAMILY HISTORY:  family history includes Diabetes in his brother; Emphysema in his father; Heart attack in his brother; Heart disease in his father; Other in his mother. SOCIAL HISTORY:  reports that he has never smoked. He has never used smokeless tobacco. He reports that he drinks alcohol. He reports that he does not use drugs.  REVIEW OF SYSTEMS:   All negative; except for those that are bolded, which indicate positives.  Constitutional: weight loss, weight gain, night sweats, fevers, chills, fatigue, weakness.  HEENT: headaches, sore throat, sneezing, nasal congestion, post nasal drip,  difficulty swallowing, tooth/dental problems, visual complaints, visual changes, ear aches. Neuro: difficulty with speech, weakness, numbness, ataxia. CV:  chest pain, orthopnea, PND, swelling in lower extremities, dizziness, palpitations, syncope.  Resp: cough, hemoptysis, dyspnea - improved, wheezing. GI: heartburn,  indigestion, abdominal pain, nausea, vomiting, diarrhea, constipation, change in bowel habits, loss of appetite, hematemesis, melena, hematochezia.  GU: dysuria, change in color of urine, urgency or frequency, flank pain, hematuria. MSK: joint pain or swelling, decreased range of motion. Psych: change in mood or affect, depression, anxiety, suicidal ideations, homicidal ideations. Skin: rash, itching, bruising.   SUBJECTIVE:  Was on BiPAP on my arrival.  This has been removed.  He currently denies dyspnea, chest pain.  VITAL SIGNS: Pulse Rate:  [107] 107 (06/25 2154) Resp:  [36] 36 (06/25 2154)  PHYSICAL EXAMINATION: General: Adult male, resting in bed, in NAD. Neuro: A&O x 3, no deficits. HEENT: [place]/AT. Sclerae anicteric, EOMI. Cardiovascular: Tachy, regular, no M/R/G.  Lungs: Respirations shallow and rapid.  Expiratory wheeze bilaterally. Abdomen: BS x 4.  Abdominal mass noted in RUQ (pt attributes this to previous rectus sheath hematoma).  Non-tender.  Musculoskeletal: No gross deformities, 1+ edema.  Skin: Intact, cool, mottling of LE's.   Recent Labs  Lab 06/19/18 2128  NA 134*  K 6.1*  CL 102  CO2 8*  BUN 59*  CREATININE 3.58*  GLUCOSE 148*   No results for input(s): HGB, HCT, WBC, PLT in the last 168 hours. Dg Chest Port 1 View  Result Date: 06/19/2018 CLINICAL DATA:  Dyspnea EXAM: PORTABLE CHEST 1 VIEW COMPARISON:  06/19/2018 chest radiograph. FINDINGS: Stable cardiomediastinal silhouette with mild cardiomegaly. No pneumothorax. Small right pleural effusion, stable. No left pleural effusion. No overt pulmonary edema. Mild bibasilar atelectasis. IMPRESSION: 1. Stable mild cardiomegaly without overt pulmonary edema. 2. Stable small right pleural effusion and mild bibasilar atelectasis. Electronically Signed   By: Ilona Sorrel M.D.   On: 06/19/2018 22:02    STUDIES:  CT chest 6/25 > RLL PE, large right pleural effusion with atelectasis. CXR 6/25 > cardiomegaly, small  right effusion, mild atx. Renal US 6/25 >   SIGNIFICANT EVENTS  6/24 > admitted to [facility]. 6/25 > transferred to [facility].  ASSESSMENT / PLAN:  Tachypnea - compensatory given metabolic acidosis.  Adequate compensation (per [place], expected pCO2 should be in 18 - 22 range). Plan: D/c BiPAP. Nothing further needed for now, continue supportive care.  RLL PE - on CT chest from [facility] 6/24. Plan: D/c eliquis and change to heparin in case of any procedures needed tomorrow 6/26. F/u LE duplex.  Right pleural effusion - presumed transudative in setting CHF. Plan: Might need diagnostic and therapeutic thora, re-eval tomorrow AM 6/26.  AGMA - presumed due to renal failure. ? Cardiogenic shock. Hyponatremia. Hyperkalemia - likely driven by acidosis + renal failure.  S/p temporizing measures. AoCKD. Plan: 3 amps HCO3 now. Might need HCO3 drip. Day team to please consult nephrology.  ? Cardiogenic shock. AoC sCHF (Echo from [facility] 6/24 with EF 20% and biventricular failure). Troponin bump - suspect demand. Hx A.fib / flutter (not on anticoagulation due to pt preference given hx rectal sheath hematoma), NICM. Plan: Insert Arterial line to verify pressures. Hold further lasix for now given borderline pressures. Might need addition of inotropes. Trend troponins. Insert foley, strict I/O's. Cardiology following.  Abdominal mass - pt attributes to rectal sheath hematoma (though this was 6 years ago). Plan: Consider CT for further eval (concern that hematoma should have resolved  by now given 6 year hx).    Montey Hora, [facility] Pager: [phone]  or [phone] 06/20/2018, 12:04 AM

## 2018-06-20 NOTE — Progress Notes (Addendum)
ANTICOAGULATION CONSULT NOTE  Pharmacy Consult for Apixaban>>>>Heparin  Indication: pulmonary embolus, afib  Allergies  Allergen Reactions  . Amiodarone Rash    Rash resolved when amio stopped 2013, retried in 2017 with same reaction    Patient Measurements: Ht: 5'6" Wt: 92.2 kg Heparin Dose Weight: 83.5 kg  Vital Signs: Temp: 96.9 F (36.1 C) (06/26 1035) Temp Source: Oral (06/26 1035) BP: 91/74 (06/26 1104) Pulse Rate: 36 (06/26 1100)  Labs: Recent Labs    06/19/18 2128 06/20/18 0113 06/20/18 0420 06/20/18 0815  HGB  --  14.7  --   --   HCT  --  47.3  --   --   PLT  --  120*  --   --   APTT  --   --   --  83*  LABPROT  --  42.1* 45.2* 43.4*  INR  --  4.46* 4.88* 4.63*  HEPARINUNFRC  --   --   --  >2.20*  CREATININE 3.58* 3.76*  --  4.30*  TROPONINI 0.27*  --  0.27* 0.32*    Estimated Creatinine Clearance: 17.2 mL/min (A) (by C-G formula based on SCr of 4.3 mg/dL (H)).   Medical History: Past Medical History:  Diagnosis Date  . Acute blood loss anemia    Due to rectus sheath hematoma 03/2012  . Acute renal failure (HCC)    03/2012 (not on ACEI due to this) - renal US unremarkable  . Atrial fibrillation (HCC)   . Cardiomyopathy (HCC)    Transient cardiomyopathy with EF 30-35% 03/27/12, improved to 55-60% 04/16/12  . CHF (congestive heart failure) (HCC)   . Essential hypertension, benign   . Hematuria 03/2012  . Hyperglycemia 03/2012  . Pleural effusion 03/2012  . Rectus sheath hematoma    Spontaneous during 03/2012 hospitalization managed conservatively with blood products  . Respiratory failure (HCC)    VDRF 03/2012 in setting of CHF  . Shock (HCC)    03/2012 hospitalization - Cardiogenic shock requiring pressors initially, followed by hemorrhagic shock later in hospitalization  . Unspecified sleep apnea    Suspected during 03/2012 hospitalization    Assessment: 72 y/o transfer from outside hospital with afib and PE, pt started on apixaban for PE in RLL  without heart strain on CTA at [facility] (last dose on 6/25 in AM). Transitioned to heparin.   Initial heparin level falsely elevated (as anticipated given apixaban dose), aPTT therapeutic at 83, on 1200 units/hr. Hgb 14.7, plt 120. INR 4.88 down to 4.63 - likely related to shock and recent apixaban dose.   No s/sx of bleeding. No infusion issues. Heparin was stopped this morning for central line placement. Given increasing Scr, CCM consulted nephrology to determine if further renal intervention is needed.   Goal of Therapy: Heparin level 0.3-0.7 units/mL aPTT 66-102 secs  Monitor platelets by anticoagulation protocol: Yes   Plan:  Follow up plan from nephrology - will plan on restarting heparin infusion as soon as possible  -When resume, would restart at 1200 units/hr and get 8 hour confirmatory aPTT -Monitor daily HL/aPTT (continue to monitor both until correlate), CBC, and for s/sx of bleeding   Girard Cooter, PharmD Clinical Pharmacist  Pager: [phone] Phone: [phone] 06/20/2018,11:08 AM  ADDENDUM  Okay to resume heparin at this time per Dr Gala Romney. Will restart 1200 units/hr and obtain 8 hour aPTT. If plans for further renal intervention by Nephrology, can pause heparin and resume when appropriate.   Girard Cooter, PharmD Clinical Pharmacist

## 2018-06-20 NOTE — Consult Note (Signed)
[facility] Nephrology Consultation Note  Requesting MD: Dr. Nelda Marseille (ICU) Reason for consult: AKI  HPI:  Martin [name] is a 72 y.o. male.  With history of hypertension, atrial fibrillation, previous rectus sheath hematoma, nonischemic cardiomyopathy, systolic CHF with EF of 20 to 25%, transferred from [facility] due to cardiogenic shock in the setting of recurrent A. fib.  Patient reported that he was not taking medication for last few month.  He started having worsening shortness of breath, lower extremity edema.  He went out to outside hospital when patient was found to have A. fib with RVR, CT angios demonstrated right lower lobe lung PE.  He was a started on Eliquis.  Transferred to [facility] for further evaluation and management. Patient has CKD with baseline serum creatinine level around 1.8.  During this admission on 6/25, the serum creatinine level was 3.58, potassium 6.1 and CO2 of 8.  Patient was treated with IV fluid and medical management.  The serum creatinine level continued to elevate to 4.3 today associated with potassium of 5.9.  He has no urine output at least since yesterday. Temporary catheter was placed today for the medication.  Nephrology was consulted for further evaluation and management. Denied use of NSAIDs or herbal medication. Patient has BNP of 1946, troponin 0 0.32 He is currently receiving Levophed, sodium bicarbonate and  started on IV Lasix.  Patient's wife at bedside.  Creat  Date/Time Value Ref Range Status  11/30/2012 02:00 PM 1.25 0.50 - 1.35 mg/dL Final  05/24/2012 03:35 PM 1.28 0.50 - 1.35 mg/dL Final  04/23/2012 12:35 PM 1.68 (H) 0.50 - 1.35 mg/dL Final   Creatinine, Ser  Date/Time Value Ref Range Status  06/20/2018 08:15 AM 4.30 (H) 0.61 - 1.24 mg/dL Final  06/20/2018 01:13 AM 3.76 (H) 0.61 - 1.24 mg/dL Final  06/19/2018 09:28 PM 3.58 (H) 0.61 - 1.24 mg/dL Final  02/22/2017 12:17 PM 1.19 0.61 - 1.24 mg/dL Final  02/14/2017 02:01  PM 1.34 (H) 0.61 - 1.24 mg/dL Final  02/06/2017 04:41 PM 1.80 (H) 0.61 - 1.24 mg/dL Final  02/06/2017 05:08 AM 1.76 (H) 0.61 - 1.24 mg/dL Final  02/05/2017 06:39 PM 1.81 (H) 0.61 - 1.24 mg/dL Final  02/05/2017 05:14 AM 1.83 (H) 0.61 - 1.24 mg/dL Final  02/04/2017 04:20 PM 1.86 (H) 0.61 - 1.24 mg/dL Final  02/04/2017 03:08 AM 1.58 (H) 0.61 - 1.24 mg/dL Final  02/03/2017 04:27 PM 1.67 (H) 0.61 - 1.24 mg/dL Final  02/03/2017 03:13 AM 1.71 (H) 0.61 - 1.24 mg/dL Final  02/02/2017 04:21 PM 1.74 (H) 0.61 - 1.24 mg/dL Final  02/02/2017 07:17 AM 1.54 (H) 0.61 - 1.24 mg/dL Final  02/02/2017 02:58 AM 1.65 (H) 0.61 - 1.24 mg/dL Final  02/01/2017 06:29 AM 1.83 (H) 0.61 - 1.24 mg/dL Final  01/31/2017 07:00 PM 1.98 (H) 0.61 - 1.24 mg/dL Final  01/31/2017 06:54 PM 2.10 (H) 0.61 - 1.24 mg/dL Final  01/31/2017 06:54 PM 2.10 (H) 0.61 - 1.24 mg/dL Final  11/03/2016 03:43 PM 1.29 (H) 0.61 - 1.24 mg/dL Final  06/06/2016 11:41 AM 1.33 (H) 0.61 - 1.24 mg/dL Final  05/27/2016 03:30 AM 2.15 (H) 0.61 - 1.24 mg/dL Final  05/26/2016 12:15 PM 2.06 (H) 0.61 - 1.24 mg/dL Final  05/25/2016 03:27 AM 2.32 (H) 0.61 - 1.24 mg/dL Final  05/24/2016 03:15 AM 2.69 (H) 0.61 - 1.24 mg/dL Final  05/23/2016 05:14 AM 2.90 (H) 0.61 - 1.24 mg/dL Final  05/22/2016 05:20 AM 2.63 (H) 0.61 - 1.24 mg/dL Final  05/21/2016 03:15 AM 2.44 (H) 0.61 - 1.24 mg/dL Final  05/20/2016 04:00 AM 2.84 (H) 0.61 - 1.24 mg/dL Final  05/19/2016 04:30 PM 3.02 (H) 0.61 - 1.24 mg/dL Final  05/19/2016 04:45 AM 2.96 (H) 0.61 - 1.24 mg/dL Final  05/18/2016 02:40 PM 3.20 (H) 0.61 - 1.24 mg/dL Final  05/18/2016 09:10 AM 2.56 (H) 0.61 - 1.24 mg/dL Final  05/18/2016 04:39 AM 2.26 (H) 0.61 - 1.24 mg/dL Final  05/17/2016 01:01 PM 1.70 (H) 0.61 - 1.24 mg/dL Final  05/17/2016 12:50 PM 1.66 (H) 0.61 - 1.24 mg/dL Final  06/04/2015 10:50 AM 1.54 (H) 0.61 - 1.24 mg/dL Final  04/01/2015 11:00 AM 1.47 (H) 0.50 - 1.35 mg/dL Final  03/10/2015 10:15 AM 2.39 (H) 0.50 - 1.35  mg/dL Final  03/07/2015 04:30 AM 2.34 (H) 0.50 - 1.35 mg/dL Final  03/06/2015 04:35 AM 2.45 (H) 0.50 - 1.35 mg/dL Final  03/05/2015 06:25 AM 2.92 (H) 0.50 - 1.35 mg/dL Final  03/05/2015 05:00 AM 2.80 (H) 0.50 - 1.35 mg/dL Final  03/04/2015 04:03 AM 3.45 (H) 0.50 - 1.35 mg/dL Final  03/03/2015 04:31 AM 3.31 (H) 0.50 - 1.35 mg/dL Final  03/02/2015 01:50 PM 3.03 (H) 0.50 - 1.35 mg/dL Final  03/02/2015 05:16 AM 2.52 (H) 0.50 - 1.35 mg/dL Final  03/01/2015 11:10 PM 2.11 (H) 0.50 - 1.35 mg/dL Final  03/01/2015 10:10 AM 1.58 (H) 0.50 - 1.35 mg/dL Final  04/17/2012 06:10 AM 1.48 (H) 0.50 - 1.35 mg/dL Final  04/16/2012 05:22 AM 1.48 (H) 0.50 - 1.35 mg/dL Final     PMHx:   Past Medical History:  Diagnosis Date  . Acute blood loss anemia    Due to rectus sheath hematoma 03/2012  . Acute renal failure ([place])    03/2012 (not on ACEI due to this) - renal US unremarkable  . Atrial fibrillation ([place])   . Cardiomyopathy ([place])    Transient cardiomyopathy with EF 30-35% 03/27/12, improved to 55-60% 04/16/12  . CHF (congestive heart failure) ([place])   . Essential hypertension, benign   . Hematuria 03/2012  . Hyperglycemia 03/2012  . Pleural effusion 03/2012  . Rectus sheath hematoma    Spontaneous during 03/2012 hospitalization managed conservatively with blood products  . Respiratory failure ([place])    VDRF 03/2012 in setting of CHF  . Shock ([place])    03/2012 hospitalization - Cardiogenic shock requiring pressors initially, followed by hemorrhagic shock later in hospitalization  . Unspecified sleep apnea    Suspected during 03/2012 hospitalization     Past Surgical History:  Procedure Laterality Date  . CARDIOVERSION N/A 03/05/2015   Procedure: CARDIOVERSION;  Surgeon: Jolaine Artist, MD;  Location: [facility] ENDOSCOPY;  Service: Cardiovascular;  Laterality: N/A;  . CARDIOVERSION N/A 05/20/2016   Procedure: CARDIOVERSION;  Surgeon: Jolaine Artist, MD;  Location: [facility];  Service: Cardiovascular;  Laterality:  N/A;  . CARDIOVERSION N/A 05/25/2016   Procedure: CARDIOVERSION;  Surgeon: Thayer Headings, MD;  Location: [facility];  Service: Cardiovascular;  Laterality: N/A;  . CARDIOVERSION N/A 02/03/2017   Procedure: CARDIOVERSION;  Surgeon: Jolaine Artist, MD;  Location: [facility] ENDOSCOPY;  Service: Cardiovascular;  Laterality: N/A;  . TEE WITHOUT CARDIOVERSION N/A 03/05/2015   Procedure: TRANSESOPHAGEAL ECHOCARDIOGRAM (TEE);  Surgeon: Jolaine Artist, MD;  Location: [facility] ENDOSCOPY;  Service: Cardiovascular;  Laterality: N/A;  . TEE WITHOUT CARDIOVERSION N/A 05/20/2016   Procedure: TRANSESOPHAGEAL ECHOCARDIOGRAM (TEE);  Surgeon: Jolaine Artist, MD;  Location: [facility] OR;  Service: Cardiovascular;  Laterality: N/A;  . TEE  WITHOUT CARDIOVERSION N/A 02/03/2017   Procedure: TRANSESOPHAGEAL ECHOCARDIOGRAM (TEE);  Surgeon: Jolaine Artist, MD;  Location: [facility] ENDOSCOPY;  Service: Cardiovascular;  Laterality: N/A;    Family Hx:  Family History  Problem Relation Age of Onset  . Emphysema Father   . Heart disease Father   . Other Mother        died during a leg surgery  . Diabetes Brother   . Heart attack Brother     Social History:  reports that he has never smoked. He has never used smokeless tobacco. He reports that he drinks alcohol. He reports that he does not use drugs.  Allergies:  Allergies  Allergen Reactions  . Amiodarone Rash    Rash resolved when amio stopped 2013, retried in 2017 with same reaction    Medications: Prior to Admission medications   Medication Sig Start Date End Date Taking? Authorizing Provider  atorvastatin (LIPITOR) 80 MG tablet Take 1 tablet (80 mg total) by mouth daily at 6 PM. 05/27/16  Yes Clegg, Amy D, NP  carvedilol (COREG) 3.125 MG tablet Take 1 tablet (3.125 mg total) by mouth 2 (two) times daily with a meal. 04/05/17  Yes Clegg, Amy D, NP  furosemide (LASIX) 40 MG tablet Take 1 tablet (40 mg total) by mouth daily. Take an extra '40mg'$  (1 tablet) for weight 190lbs or  greater 02/07/17  Yes Tillery, Satira Mccallum, PA-C  losartan (COZAAR) 25 MG tablet Take 0.5 tablets (12.5 mg total) by mouth at bedtime. 02/07/17  Yes Shirley Friar, PA-C  Multiple Vitamin (MULTIVITAMIN WITH MINERALS) TABS tablet Take 1 tablet by mouth daily.   Yes [provider]    I have reviewed the patient's current medications.  Labs:  Results for orders placed or performed during the hospital encounter of 06/19/18 (from the past 48 hour(s))  Troponin I (q 6hr x 3)     Status: Abnormal   Collection Time: 06/19/18  9:28 PM  Result Value Ref Range   Troponin I 0.27 (HH) <0.03 ng/mL    Comment: CRITICAL RESULT CALLED TO, READ BACK BY AND VERIFIED WITH: A.HODGES,RN 2253 06/19/18 M.CAMPBELL Performed at [facility], [place] [address] [place], [place] [address]   Brain natriuretic peptide     Status: Abnormal   Collection Time: 06/19/18  9:28 PM  Result Value Ref Range   B Natriuretic Peptide 1,948.0 (H) 0.0 - 100.0 pg/mL    Comment: Performed at [facility] [address], [place], [place] [address]  Basic metabolic panel     Status: Abnormal   Collection Time: 06/19/18  9:28 PM  Result Value Ref Range   Sodium 134 (L) 135 - 145 mmol/L   Potassium 6.1 (H) 3.5 - 5.1 mmol/L   Chloride 102 98 - 111 mmol/L    Comment: Please note change in reference range.   CO2 8 (L) 22 - 32 mmol/L   Glucose, Bld 148 (H) 70 - 99 mg/dL    Comment: Please note change in reference range.   BUN 59 (H) 8 - 23 mg/dL    Comment: Please note change in reference range.   Creatinine, Ser 3.58 (H) 0.61 - 1.24 mg/dL   Calcium 8.2 (L) 8.9 - 10.3 mg/dL   GFR calc non Af Amer 16 (L) >60 mL/min   GFR calc Af Amer 19 (L) >60 mL/min    Comment: (NOTE) The eGFR has been calculated using the CKD EPI equation. This calculation has not been validated  in all clinical situations. eGFR's persistently <60 mL/min signify possible Chronic Kidney Disease.    Anion gap 24 (H) 5 - 15     Comment: Performed at [facility], [place] [address], [place], [place] [address]  Blood gas, arterial     Status: Abnormal   Collection Time: 06/19/18  9:44 PM  Result Value Ref Range   O2 Content 2.0 L/min   Delivery systems NASAL CANNULA    pH, Arterial 7.190 (LL) 7.350 - 7.450    Comment: CRITICAL RESULT CALLED TO, READ BACK BY AND VERIFIED WITH: BYRD,C RRT AT 2155 ON 06/19/2018 BY DAY,J RRT    pCO2 arterial 26.4 (L) 32.0 - 48.0 mmHg   pO2, Arterial 83.7 83.0 - 108.0 mmHg   Bicarbonate 9.7 (L) 20.0 - 28.0 mmol/L   Acid-base deficit 16.9 (H) 0.0 - 2.0 mmol/L   O2 Saturation 91.9 %   Patient temperature 99.1    Collection site LEFT RADIAL    Drawn by [phone]    Sample type ARTERIAL    Allens test (pass/fail) PASS PASS  MRSA PCR Screening     Status: None   Collection Time: 06/19/18  9:51 PM  Result Value Ref Range   MRSA by PCR NEGATIVE NEGATIVE    Comment:        The GeneXpert MRSA Assay (FDA approved for NASAL specimens only), is one component of a comprehensive MRSA colonization surveillance program. It is not intended to diagnose MRSA infection nor to guide or monitor treatment for MRSA infections. Performed at [facility], [place] [address], [place], [place] [address]   Hemoglobin A1c     Status: Abnormal   Collection Time: 06/20/18  1:13 AM  Result Value Ref Range   Hgb A1c MFr Bld 6.1 (H) 4.8 - 5.6 %    Comment: (NOTE) Pre diabetes:          5.7%-6.4% Diabetes:              >6.4% Glycemic control for   <7.0% adults with diabetes    Mean Plasma Glucose 128.37 mg/dL    Comment: Performed at [facility] [address] [place], [place] [address]  Lipid panel     Status: Abnormal   Collection Time: 06/20/18  1:13 AM  Result Value Ref Range   Cholesterol 178 0 - 200 mg/dL   Triglycerides 136 <150 mg/dL   HDL 30 (L) >40 mg/dL   Total CHOL/HDL Ratio 5.9 RATIO   VLDL 27 0 - 40 mg/dL   LDL Cholesterol 121 (H) 0 - 99 mg/dL    Comment:        Total  Cholesterol/HDL:CHD Risk Coronary Heart Disease Risk Table                     Men   Women  1/2 Average Risk   3.4   3.3  Average Risk       5.0   4.4  2 X Average Risk   9.6   7.1  3 X Average Risk  23.4   11.0        Use the calculated Patient Ratio above and the CHD Risk Table to determine the patient's CHD Risk.        ATP III CLASSIFICATION (LDL):  <100     mg/dL   Optimal  100-129  mg/dL   Near or Above  Optimal  130-159  mg/dL   Borderline  160-189  mg/dL   High  >190     mg/dL   Very High Performed at [facility] [address], [place], [place] [address]   CBC     Status: Abnormal   Collection Time: 06/20/18  1:13 AM  Result Value Ref Range   WBC 17.8 (H) 4.0 - 10.5 K/uL   RBC 4.91 4.22 - 5.81 MIL/uL   Hemoglobin 14.7 13.0 - 17.0 g/dL   HCT 47.3 39.0 - 52.0 %   MCV 96.3 78.0 - 100.0 fL   MCH 29.9 26.0 - 34.0 pg   MCHC 31.1 30.0 - 36.0 g/dL   RDW 17.2 (H) 11.5 - 15.5 %   Platelets 120 (L) 150 - 400 K/uL    Comment: Performed at [facility], [place] [address], [place], [place] [address]  Lactic acid, plasma     Status: None   Collection Time: 06/20/18  1:13 AM  Result Value Ref Range   Lactic Acid, Venous 1.4 0.5 - 1.9 mmol/L    Comment: Performed at [facility] [address], [place], [place] [address]  Basic metabolic panel     Status: Abnormal   Collection Time: 06/20/18  1:13 AM  Result Value Ref Range   Sodium 136 135 - 145 mmol/L   Potassium 6.9 (HH) 3.5 - 5.1 mmol/L    Comment: CRITICAL RESULT CALLED TO, READ BACK BY AND VERIFIED WITH: C.FLETCHER,RN 0158 06/20/18 M.CAMPBELL    Chloride 103 98 - 111 mmol/L    Comment: Please note change in reference range.   CO2 13 (L) 22 - 32 mmol/L   Glucose, Bld 134 (H) 70 - 99 mg/dL    Comment: Please note change in reference range.   BUN 61 (H) 8 - 23 mg/dL    Comment: Please note change in reference range.   Creatinine, Ser 3.76 (H) 0.61 - 1.24 mg/dL   Calcium 8.1 (L) 8.9  - 10.3 mg/dL   GFR calc non Af Amer 15 (L) >60 mL/min   GFR calc Af Amer 17 (L) >60 mL/min    Comment: (NOTE) The eGFR has been calculated using the CKD EPI equation. This calculation has not been validated in all clinical situations. eGFR's persistently <60 mL/min signify possible Chronic Kidney Disease.    Anion gap 20 (H) 5 - 15    Comment: Performed at [facility], [place] [address], [place], [place] [address]  Procalcitonin - Baseline     Status: None   Collection Time: 06/20/18  1:13 AM  Result Value Ref Range   Procalcitonin 2.82 ng/mL    Comment:        Interpretation: PCT > 2 ng/mL: Systemic infection (sepsis) is likely, unless other causes are known. (NOTE)       Sepsis PCT Algorithm           Lower Respiratory Tract                                      Infection PCT Algorithm    ----------------------------     ----------------------------         PCT < 0.25 ng/mL                PCT < 0.10 ng/mL         Strongly encourage  Strongly discourage   discontinuation of antibiotics    initiation of antibiotics    ----------------------------     -----------------------------       PCT 0.25 - 0.50 ng/mL            PCT 0.10 - 0.25 ng/mL               OR       >80% decrease in PCT            Discourage initiation of                                            antibiotics      Encourage discontinuation           of antibiotics    ----------------------------     -----------------------------         PCT >= 0.50 ng/mL              PCT 0.26 - 0.50 ng/mL               AND       <80% decrease in PCT              Encourage initiation of                                             antibiotics       Encourage continuation           of antibiotics    ----------------------------     -----------------------------        PCT >= 0.50 ng/mL                  PCT > 0.50 ng/mL               AND         increase in PCT                  Strongly encourage                                       initiation of antibiotics    Strongly encourage escalation           of antibiotics                                     -----------------------------                                           PCT <= 0.25 ng/mL                                                 OR                                        >  80% decrease in PCT                                     Discontinue / Do not initiate                                             antibiotics Performed at [facility], [place] [address] [place], [place] [address]   Protime-INR     Status: Abnormal   Collection Time: 06/20/18  1:13 AM  Result Value Ref Range   Prothrombin Time 42.1 (H) 11.4 - 15.2 seconds    Comment: REPEATED TO VERIFY   INR 4.46 (HH)     Comment: REPEATED TO VERIFY CRITICAL RESULT CALLED TO, READ BACK BY AND VERIFIED WITH: A. HODGES,RN 0150 06/20/2018 Mena Goes Performed at [facility], [place] [address] [place], [place] [address]   Blood gas, arterial     Status: Abnormal   Collection Time: 06/20/18  3:00 AM  Result Value Ref Range   pH, Arterial 7.301 (L) 7.350 - 7.450   pCO2 arterial 31.2 (L) 32.0 - 48.0 mmHg   pO2, Arterial 78.6 (L) 83.0 - 108.0 mmHg   Bicarbonate 14.9 (L) 20.0 - 28.0 mmol/L   Acid-base deficit 10.2 (H) 0.0 - 2.0 mmol/L   O2 Saturation 93.1 %   Patient temperature 98.6    Collection site LEFT RADIAL    Drawn by [id number]    Sample type ARTERIAL    Allens test (pass/fail) PASS PASS  Troponin I (q 6hr x 3)     Status: Abnormal   Collection Time: 06/20/18  4:20 AM  Result Value Ref Range   Troponin I 0.27 (HH) <0.03 ng/mL    Comment: CRITICAL VALUE NOTED.  VALUE IS CONSISTENT WITH PREVIOUSLY REPORTED AND CALLED VALUE. Performed at [facility], [place] [address], [place], [place] [address]   Lactic acid, plasma     Status: Abnormal   Collection Time: 06/20/18  4:20 AM  Result Value Ref Range   Lactic Acid, Venous 5.4 (HH) 0.5 - 1.9 mmol/L    Comment: CRITICAL  RESULT CALLED TO, READ BACK BY AND VERIFIED WITH: L.HARRISON,RN 2595 06/20/18 M.CAMPBELL Performed at [facility], [place] [address], [place], [place] [address]   Protime-INR     Status: Abnormal   Collection Time: 06/20/18  4:20 AM  Result Value Ref Range   Prothrombin Time 45.2 (H) 11.4 - 15.2 seconds    Comment: REPEATED TO VERIFY   INR 4.88 (HH)     Comment: REPEATED TO VERIFY CRITICAL RESULT CALLED TO, READ BACK BY AND VERIFIED WITH: L. Aline Brochure, RN 0530 06/20/2018 BY MACEDA,J. Performed at [facility], [place] [address], [place], [place] [address]   Prepare fresh frozen plasma     Status: None (Preliminary result)   Collection Time: 06/20/18  5:58 AM  Result Value Ref Range   Unit Number [id number]    Blood Component Type THAWED PLASMA    Unit division 00    Status of Unit ISSUED    Transfusion Status      OK TO TRANSFUSE Performed at [facility] [address] [place], [place] [address]    Unit Number [id number]    Blood Component Type THAWED PLASMA  Unit division 00    Status of Unit ISSUED    Transfusion Status OK TO TRANSFUSE    Unit Number [id number]    Blood Component Type THAWED PLASMA    Unit division 00    Status of Unit ISSUED    Transfusion Status OK TO TRANSFUSE   Heparin level (unfractionated)     Status: Abnormal   Collection Time: 06/20/18  8:15 AM  Result Value Ref Range   Heparin Unfractionated >2.20 (H) 0.30 - 0.70 IU/mL    Comment: RESULTS CONFIRMED BY MANUAL DILUTION (NOTE) If heparin results are below expected values, and patient dosage has  been confirmed, suggest follow up testing of antithrombin III levels. Performed at [facility], [place] [address] [place], [place] [address]   Basic metabolic panel     Status: Abnormal   Collection Time: 06/20/18  8:15 AM  Result Value Ref Range   Sodium 133 (L) 135 - 145 mmol/L   Potassium 5.9 (H) 3.5 - 5.1 mmol/L   Chloride 99 98 - 111 mmol/L    Comment: Please  note change in reference range.   CO2 14 (L) 22 - 32 mmol/L   Glucose, Bld 128 (H) 70 - 99 mg/dL    Comment: Please note change in reference range.   BUN 71 (H) 8 - 23 mg/dL    Comment: Please note change in reference range.   Creatinine, Ser 4.30 (H) 0.61 - 1.24 mg/dL   Calcium 7.8 (L) 8.9 - 10.3 mg/dL   GFR calc non Af Amer 13 (L) >60 mL/min   GFR calc Af Amer 15 (L) >60 mL/min    Comment: (NOTE) The eGFR has been calculated using the CKD EPI equation. This calculation has not been validated in all clinical situations. eGFR's persistently <60 mL/min signify possible Chronic Kidney Disease.    Anion gap 20 (H) 5 - 15    Comment: Performed at [facility], [place] [address], [place], [place] [address]  Troponin I     Status: Abnormal   Collection Time: 06/20/18  8:15 AM  Result Value Ref Range   Troponin I 0.32 (HH) <0.03 ng/mL    Comment: CRITICAL VALUE NOTED.  VALUE IS CONSISTENT WITH PREVIOUSLY REPORTED AND CALLED VALUE. Performed at [facility], [place] [address] [place], [place] [address]   Protime-INR     Status: Abnormal   Collection Time: 06/20/18  8:15 AM  Result Value Ref Range   Prothrombin Time 43.4 (H) 11.4 - 15.2 seconds   INR 4.63 (HH)     Comment: REPEATED TO VERIFY CRITICAL RESULT CALLED TO, READ BACK BY AND VERIFIED WITHRudene Anda RN [phone] 06/20/2018 BY A BENNETT Performed at [facility], [place] [address], [place], [place] [address]   APTT     Status: Abnormal   Collection Time: 06/20/18  8:15 AM  Result Value Ref Range   aPTT 83 (H) 24 - 36 seconds    Comment:        IF BASELINE aPTT IS ELEVATED, SUGGEST PATIENT RISK ASSESSMENT BE USED TO DETERMINE APPROPRIATE ANTICOAGULANT THERAPY. Performed at [facility], [place] [address] [place], [place] [address]   Lactic acid, plasma     Status: Abnormal   Collection Time: 06/20/18  8:58 AM  Result Value Ref Range   Lactic Acid, Venous 3.6 (HH) 0.5 - 1.9 mmol/L    Comment: CRITICAL RESULT  CALLED TO, READ BACK BY AND VERIFIED WITH: NIKKI  BEASLEY,RN AT 1013 06/20/18 BY ZBEECH. Performed at [facility], [place] [address] [place], [place] [address]   Cooxemetry Panel (carboxy, met, total hgb, O2 sat)     Status: None   Collection Time: 06/20/18  9:10 AM  Result Value Ref Range   Total hemoglobin 14.3 12.0 - 16.0 g/dL   O2 Saturation 40.1 %   Carboxyhemoglobin 0.7 0.5 - 1.5 %   Methemoglobin 0.8 0.0 - 1.5 %  Cooxemetry Panel (carboxy, met, total hgb, O2 sat)     Status: Abnormal   Collection Time: 06/20/18 12:50 PM  Result Value Ref Range   Total hemoglobin 12.9 12.0 - 16.0 g/dL   O2 Saturation 56.4 %   Carboxyhemoglobin 0.5 0.5 - 1.5 %   Methemoglobin 1.6 (H) 0.0 - 1.5 %     ROS:  Pertinent items noted in HPI and remainder of comprehensive ROS otherwise negative.  Physical Exam: Vitals:   06/20/18 1215 06/20/18 1230  BP: 117/83 (!) 114/96  Pulse: (!) 109 (!) 109  Resp: (!) 24 (!) 23  Temp:    SpO2: 100% 100%     General exam: Ill looking male, lying in bed Respiratory system: Decreased breath sound bilateral, respiratory effort normal  cardiovascular system: S1 & S2 heard, RRR.  Lower extremity pitting edema ++. Gastrointestinal system: Abdomen is distended, soft. Normal bowel sounds heard. Central nervous system: Alert and oriented. No focal neurological deficits. Extremities: Symmetric 5 x 5 power. Skin: No rashes, lesions or ulcers Psychiatry: Judgement and insight appear normal. Mood & affect appropriate.   Assessment/Plan:  #Acute on chronic kidney disease likely cardiorenal syndrome due to decompensated heart failure: Patient was hypotensive to 50s.  He is currently anuric.  The ultrasound of abdomen showed bilateral atrophy kidney with no obstruction.  Check urinalysis and urine electrolytes.  Started on IV Lasix drip about half an hour ago.  Closely monitor urine output in next 1-2 hours.  If no significant urine output then the plan is to start  CRRT.  I have discussed this with the patient and his wife at bedside.  The risk and benefit of dialysis discussed.  Patient needs another line for CRRT. -Avoid nephrotoxins, monitor BMP  #Hyperkalemia in the setting of renal failure: Currently on medical management including sodium bicarbonate drip.  I will order a dose of Veltassa.  Monitor lab.  #Severe lactic acidosis in the setting of poor perfusion: On sodium bicarbonate and medical treatment.  Blood pressure is better now.  #Compensated heart failure: Currently on milrinone.  For heart failure team.  Echo with EF of 20 to 25%.  #Cardiogenic shock: On Levophed.  Blood pressure improved.   I discussed with the patient, his wife, ICU nurse and Dr. Nelda Marseille.  Thank you for the consult.  Apphia Cropley Tanna Furry 06/20/2018, 1:19 PM  [employer].

## 2018-06-20 NOTE — Care Management (Signed)
This is a no charge note  72 year old man with past medical history of CHF with EF of 15%, atrial fibrillation not taking AC, hyperlipidemia, rectal sheath hematoma, OSA, CKD 3, who was transferred from [facility] due to SOB.  Pt did not take his lasix and blood thinner for a while and developed shortness of breath for 3 days. He was initially seen in [facility].  Patient was found to have right lower lobe pulmonary embolism without evidence of right heart strain by CT angiogram. Pt was given one dose of Eliquis in AM. He also has atrial fibrillation with RVR with heart rate up to 165. Cardizem was started. Patient was hypotensive initially per report.  Denies chest pain.  They consulted cardiology, Dr. Eden Emms and pt is transferred to [facility] hospital for further evaluation and treatment. At pt arrival, I was called to see pt ASAP since pt is in respiratory distress.  I quickly evaluated pt. Pt is in acute respiratory distress. Patient seem to be uncomfortable. Stat ABG showed pH 7.19, P CO2 26, PO2 83.7. Blood pressure is at SBP 110s. HR 110s, RR 35-45. Pt is put on BiPAP.  Patient has hyperkalemia with potassium of 6.1.  5 unit of NovoLog and 50 cc of D50 were ordered.  30 g of Kayexalate was also ordered.   Physical Exam:   Vitals:   06/20/18 0300 06/20/18 0400 06/20/18 0417 06/20/18 0500  BP: 111/88  107/89 103/80  Pulse: (!) 121   (!) 116  Resp: (!) 30 (!) 26 (!) 26 (!) 22  SpO2: 97% 95% 92% 92%  Weight: 92.2 kg (203 lb 4.2 oz)       General: in moderate acute respiratory distress HEENT: PERRL, EOMI, no scleral icterus, No JVD or bruit Cardiac: S1/S2, RRR, No murmurs, gallops or rubs Pulm: has wheezing bilaterally. Abd: Soft, nondistended, no rebound pain, no organomegaly, BS present. Has a big mass in right sided abdomen (pt states that he this mass is bleeding 2/2 blood thinner use in 2013. This is a chronic issue) Ext: 1+ leg edema. 1+DP/PT pulse bilaterally.   Lower extremities clammy. Musculoskeletal: No joint deformities, erythema, or stiffness, ROM full Skin: No rashes.  Neuro: Alert and oriented X3, cranial nerves II-XII grossly intact,  Psych: Patient is not psychotic, no suicidal or hemocidal ideation.  I consulted PCCM. Dr. Merlene Pulling and PA, Celine Mans evaluated pt and decided to transfer pt to ICU. I also consulted Cardiology, Dr. Charlestine Night. The care is transfered to [facility].   Lorretta Harp, MD  [facility] Pager [phone]  If 7PM-7AM, please contact night-coverage [email or web address] Password [facility] 06/20/2018, 5:05 AM

## 2018-06-20 NOTE — Progress Notes (Signed)
Started making urine with lasix. Continue to monitor BMP and urine output. No CRRT at this time. Continue to monitor.

## 2018-06-20 NOTE — Progress Notes (Signed)
Nephrology consulted for AKI.  Appreciate Dr. Dorena Cookey input.     Canary Brim, NP-C [facility] Pgr: [phone] or if no answer [phone] 06/20/2018, 10:42 AM

## 2018-06-20 NOTE — Progress Notes (Signed)
eLink Physician-Brief Progress Note Patient Name: Martin [name] DOB: 1946/07/29 MRN: [id number]   Date of Service  06/20/2018  HPI/Events of Note  INR = 4.88.  eICU Interventions  Will order: 1. Transfuse 3 units FFP. 2. Repeat PT/INR at 11 AM.     Intervention Category Intermediate Interventions: Coagulopathy - evaluation and management  Martin [name] 06/20/2018, 5:52 AM

## 2018-06-20 NOTE — Procedures (Signed)
Central Venous Catheter Insertion Procedure Note Martin [name] [id number] Jan 16, 1946  Procedure: Insertion of Central Venous Catheter Indications: Drug and/or fluid administration  Procedure Details Consent: Risks of procedure as well as the alternatives and risks of each were explained to the (patient/caregiver).  Consent for procedure obtained. Time Out: Verified patient identification, verified procedure, site/side was marked, verified correct patient position, special equipment/implants available, medications/allergies/relevent history reviewed, required imaging and test results available.  Performed  Maximum sterile technique was used including antiseptics, cap, gloves, gown, hand hygiene, mask and sheet. Skin prep: Chlorhexidine; local anesthetic administered A antimicrobial bonded/coated triple lumen catheter was placed in the left internal jugular vein using the Seldinger technique and real-time u/s guidance.  Evaluation Blood flow good Complications: No apparent complications Patient did tolerate procedure well. Chest X-ray ordered to verify placement.  CXR: pending.  Martin Meres MD 06/20/2018, 9:27 AM

## 2018-06-20 NOTE — Progress Notes (Signed)
[facility] Dr. Arsenio Loader notified of critical lactic acid at 5.4.

## 2018-06-21 ENCOUNTER — Inpatient Hospital Stay (HOSPITAL_COMMUNITY): Payer: Medicare HMO

## 2018-06-21 DIAGNOSIS — I480 Paroxysmal atrial fibrillation: Secondary | ICD-10-CM

## 2018-06-21 DIAGNOSIS — J810 Acute pulmonary edema: Secondary | ICD-10-CM

## 2018-06-21 LAB — CBC
HCT: 40.8 % (ref 39.0–52.0)
HEMOGLOBIN: 13.5 g/dL (ref 13.0–17.0)
MCH: 29.6 pg (ref 26.0–34.0)
MCHC: 33.1 g/dL (ref 30.0–36.0)
MCV: 89.5 fL (ref 78.0–100.0)
PLATELETS: 100 10*3/uL — AB (ref 150–400)
RBC: 4.56 MIL/uL (ref 4.22–5.81)
RDW: 15.4 % (ref 11.5–15.5)
WBC: 12.2 10*3/uL — AB (ref 4.0–10.5)

## 2018-06-21 LAB — BASIC METABOLIC PANEL
ANION GAP: 16 — AB (ref 5–15)
BUN: 88 mg/dL — AB (ref 8–23)
CALCIUM: 7.2 mg/dL — AB (ref 8.9–10.3)
CO2: 30 mmol/L (ref 22–32)
Chloride: 87 mmol/L — ABNORMAL LOW (ref 98–111)
Creatinine, Ser: 4.59 mg/dL — ABNORMAL HIGH (ref 0.61–1.24)
GFR calc Af Amer: 14 mL/min — ABNORMAL LOW (ref >60)
GFR, EST NON AFRICAN AMERICAN: 12 mL/min — AB (ref >60)
Glucose, Bld: 129 mg/dL — ABNORMAL HIGH (ref 70–99)
POTASSIUM: 3.6 mmol/L (ref 3.5–5.1)
SODIUM: 133 mmol/L — AB (ref 135–145)

## 2018-06-21 LAB — PROTIME-INR
INR: 2.66
Prothrombin Time: 28.1 seconds — ABNORMAL HIGH (ref 11.4–15.2)

## 2018-06-21 LAB — COOXEMETRY PANEL
CARBOXYHEMOGLOBIN: 0.7 % (ref 0.5–1.5)
Methemoglobin: 1.6 % — ABNORMAL HIGH (ref 0.0–1.5)
O2 SAT: 82.4 %
Total hemoglobin: 13.3 g/dL (ref 12.0–16.0)

## 2018-06-21 LAB — PROCALCITONIN: PROCALCITONIN: 3.09 ng/mL

## 2018-06-21 LAB — MAGNESIUM
MAGNESIUM: 2.2 mg/dL (ref 1.7–2.4)
Magnesium: 2.2 mg/dL (ref 1.7–2.4)

## 2018-06-21 LAB — APTT
aPTT: 118 seconds — ABNORMAL HIGH (ref 24–36)
aPTT: 94 seconds — ABNORMAL HIGH (ref 24–36)

## 2018-06-21 LAB — HEPARIN LEVEL (UNFRACTIONATED): Heparin Unfractionated: >2.2 IU/mL — ABNORMAL HIGH (ref 0.30–0.70)

## 2018-06-21 LAB — PHOSPHORUS: Phosphorus: 6.9 mg/dL — ABNORMAL HIGH (ref 2.5–4.6)

## 2018-06-21 LAB — HEPATITIS PANEL, ACUTE
Hep A IgM: NEGATIVE
Hep B C IgM: NEGATIVE
Hepatitis B Surface Ag: NEGATIVE

## 2018-06-21 MED ORDER — AMIODARONE LOAD VIA INFUSION
150.0000 mg | Freq: Once | INTRAVENOUS | Status: AC
  Administered 2018-06-21: 150 mg via INTRAVENOUS
  Filled 2018-06-21: qty 83.34

## 2018-06-21 MED ORDER — SODIUM CHLORIDE 0.9% FLUSH
3.0000 mL | INTRAVENOUS | Status: DC | PRN
Start: 2018-06-21 — End: 2018-06-29

## 2018-06-21 MED ORDER — HEPARIN (PORCINE) IN NACL 100-0.45 UNIT/ML-% IJ SOLN
900.0000 [IU]/h | INTRAMUSCULAR | Status: DC
  Administered 2018-06-21: 1050 [IU]/h via INTRAVENOUS
  Administered 2018-06-22 – 2018-06-23 (×2): 900 [IU]/h via INTRAVENOUS
  Filled 2018-06-21 (×2): qty 250

## 2018-06-21 MED ORDER — SODIUM CHLORIDE 0.9 % IV SOLN
250.0000 mL | INTRAVENOUS | Status: DC
  Administered 2018-06-22: 13:00:00 via INTRAVENOUS

## 2018-06-21 MED ORDER — AMIODARONE HCL IN DEXTROSE 360-4.14 MG/200ML-% IV SOLN
30.0000 mg/h | INTRAVENOUS | Status: DC
  Administered 2018-06-21 – 2018-06-27 (×19): 60 mg/h via INTRAVENOUS
  Administered 2018-06-28: 30 mg/h via INTRAVENOUS
  Filled 2018-06-21 (×26): qty 200

## 2018-06-21 MED ORDER — DIGOXIN 0.25 MG/ML IJ SOLN
0.2500 mg | Freq: Once | INTRAMUSCULAR | Status: AC
  Administered 2018-06-21: 0.25 mg via INTRAVENOUS
  Filled 2018-06-21: qty 2

## 2018-06-21 MED ORDER — SODIUM CHLORIDE 0.9% FLUSH
3.0000 mL | Freq: Two times a day (BID) | INTRAVENOUS | Status: DC
  Administered 2018-06-21 – 2018-06-29 (×7): 3 mL via INTRAVENOUS

## 2018-06-21 NOTE — Progress Notes (Signed)
eLink Physician-Brief Progress Note Patient Name: Martin [name] DOB: 21-Mar-1946 MRN: [id number]   Date of Service  06/21/2018  HPI/Events of Note  Persistent RVR despite several hours of Amiodarone infusion in pt with EF of 15 %  eICU Interventions  Digoxin 0.25 mg iv x 1 now and will repeat dose in 1 hour if rate remains > 115        Martin [name] U Martin [name] 06/21/2018, 12:58 AM

## 2018-06-21 NOTE — Progress Notes (Signed)
Plan was for pt to undergo R Thoracentesis, however when Ultrasound placed on posterior chest prior to beginning procedure, minimal pleural effusion noted on R.  Improvement in pleural effusion likely due to diuresis.  Risk of procedure outweighs benefit, therefore will NOT PROCEED with Thoracentesis.  Discussed with Dr. Molli Knock.   Harlon Ditty, AGACNP-BC  Pulmonary & Critical Care Medicine

## 2018-06-21 NOTE — Consult Note (Signed)
            [facility] CM Primary Care Navigator  06/21/2018  Rafik Gilley 1946-02-02 [id number]   Attempt tosee patient at the bedside to identify possible discharge needs butstaffreports that patientwastransferred from6E 23 to 2H06(ICU) due to acute respiratory distress.  Will attempt to see patient at another time when out of ICU.    For additional questions please contact:  Karin Golden A. December Hedtke, BSN, RN-BC [facility] PRIMARY CARE Navigator Cell: [phone]

## 2018-06-21 NOTE — Progress Notes (Signed)
ANTICOAGULATION CONSULT NOTE  Pharmacy Consult for Apixaban>>>>Heparin  Indication: pulmonary embolus, afib  Allergies  Allergen Reactions  . Amiodarone Rash    Rash resolved when amio stopped 2013, retried in 2017 with same reaction    Patient Measurements: Ht: 5'6" Wt: 92.2 kg Heparin Dose Weight: 83.5 kg  Vital Signs: Temp: 97.6 F (36.4 C) (06/26 2353) Temp Source: Oral (06/26 2353) BP: 124/86 (06/27 0000) Pulse Rate: 136 (06/27 0000)  Labs: Recent Labs    06/20/18 0113 06/20/18 0420 06/20/18 0815 06/20/18 1806 06/20/18 2328  HGB 14.7  --   --   --   --   HCT 47.3  --   --   --   --   PLT 120*  --   --   --   --   APTT  --   --  83*  --  118*  LABPROT 42.1* 45.2* 43.4*  --   --   INR 4.46* 4.88* 4.63*  --   --   HEPARINUNFRC  --   --  >2.20*  --  >2.20*  CREATININE 3.76*  --  4.30* 4.63*  --   TROPONINI  --  0.27* 0.32* 0.28*  --     Estimated Creatinine Clearance: 16 mL/min (A) (by C-G formula based on SCr of 4.63 mg/dL (H)).    Assessment: 72 y/o transfer from outside hospital with afib and PE, pt started on apixaban for PE in RLL without heart strain on CTA at [facility] (last dose on 6/25 in AM). Transitioned to heparin.   APTT elevated this morning 118 sec. No issues with heparin noted  Goal of Therapy: Heparin level 0.3-0.7 units/mL aPTT 66-102 secs  Monitor platelets by anticoagulation protocol: Yes   Plan:  Decrease heparin to 1050 units/hr. Check aPTT and heparin level 8 hours after rate change  Thanks for allowing pharmacy to be a part of this patient's care.  Talbert Cage, PharmD Clinical Pharmacist

## 2018-06-21 NOTE — Progress Notes (Signed)
[name] NEPHROLOGY PROGRESS NOTE  Assessment/ Plan: Pt is a 72 y.o. yo male  with history of hypertension, atrial fibrillation, previous rectus sheath hematoma, nonischemic cardiomyopathy, systolic CHF with EF of 20 to 16%, CKD stage 3,  transferred from [facility] due to cardiogenic shock in the setting of recurrent A. fib.  Assessment/Plan:  #Acute on chronic kidney disease III likely cardiorenal syndrome due to decompensated heart failure: Patient was hypotensive to 50s.  The ultrasound of abdomen showed bilateral atrophy kidney with no obstruction.  UA which was sent after foley has RBC likely trauma related. -Patient is responding well with IV Lasix drip, urine output of more than 7.5 L in 24 hours.  Currently on Lasix 20 mg/h.  Still volume overload.  Serum creatinine level 4.5 today.  Monitor electrolytes.  #Hyperkalemia in the setting of renal failure: Improved.  #Severe lactic acidosis in the setting of poor perfusion: Blood pressure is better now.  #Compensated heart failure: Currently on milrinone per HF team.  Echo with EF of 20 to 25%.  #Cardiogenic shock: On Levophed.  Blood pressure improved.    Subjective: Seen and examined at bedside.  Reported shortness of breath is somewhat better than yesterday.  Denies chest pain.  No nausea vomiting.  Has elevated heart rate. Objective Vital signs in last 24 hours: Vitals:   06/21/18 0700 06/21/18 0739 06/21/18 0800 06/21/18 0900  BP: 120/72  100/87 105/89  Pulse: 77  66 78  Resp: 14  (!) 23 (!) 21  Temp:  98.9 F (37.2 C)    TempSrc:  Oral    SpO2: 100%  100% 100%  Weight:      Height:       Weight change: -3 kg (-6 lb 9.8 oz)  Intake/Output Summary (Last 24 hours) at 06/21/2018 0915 Last data filed at 06/21/2018 0800 Gross per 24 hour  Intake 4519.72 ml  Output 8500 ml  Net -3980.28 ml       Labs: Basic Metabolic Panel: Recent Labs  Lab 06/20/18 0815 06/20/18 1806 06/21/18 0347  NA  133* 134* 133*  K 5.9* 4.4 3.6  CL 99 93* 87*  CO2 14* 22 30  GLUCOSE 128* 167* 129*  BUN 71* 82* 88*  CREATININE 4.30* 4.63* 4.59*  CALCIUM 7.8* 7.6* 7.2*  PHOS  --   --  6.9*   Liver Function Tests: No results for input(s): AST, ALT, ALKPHOS, BILITOT, PROT, ALBUMIN in the last 168 hours. No results for input(s): LIPASE, AMYLASE in the last 168 hours. No results for input(s): AMMONIA in the last 168 hours. CBC: Recent Labs  Lab 06/20/18 0113  WBC 17.8*  HGB 14.7  HCT 47.3  MCV 96.3  PLT 120*   Cardiac Enzymes: Recent Labs  Lab 06/19/18 2128 06/20/18 0420 06/20/18 0815 06/20/18 1806  TROPONINI 0.27* 0.27* 0.32* 0.28*   CBG: No results for input(s): GLUCAP in the last 168 hours.  Iron Studies: No results for input(s): IRON, TIBC, TRANSFERRIN, FERRITIN in the last 72 hours. Studies/Results: US Abdomen Complete  Result Date: 06/20/2018 CLINICAL DATA:  Acute kidney injury, abdominal mass (rectus sheath hematoma) EXAM: ABDOMEN ULTRASOUND COMPLETE COMPARISON:  CT abdomen/pelvis dated 04/08/2012 FINDINGS: Gallbladder: Layering small gallstones. Mild gallbladder wall thickening. No pericholecystic fluid. Negative sonographic Murphy's sign. Common bile duct: Diameter: 5 mm Liver: Hyperechoic hepatic parenchyma. No focal hepatic lesion is seen. Portal vein is patent on color Doppler imaging with normal direction of blood flow towards the liver. IVC: No abnormality visualized.  Pancreas: Visualized portion unremarkable. Spleen: Size and appearance within normal limits. Right Kidney: Length: 6.7 cm.  No mass or hydronephrosis. Left Kidney: Length: 6.8 cm.  No mass or hydronephrosis. Abdominal aorta: No aneurysm visualized. Other findings: Right abdominal wall mass measuring 14.6 x 9.3 x 8.3 cm, corresponding to the previously characterized rectus sheath hematoma. This measured 17.5 x 11.6 x 15.0 cm on prior CT. Small right pleural effusion. IMPRESSION: 14.6 cm right rectus sheath hematoma,  previously 17.5 cm. Cholelithiasis, without associated sonographic findings to suggest acute cholecystitis. Suspected mild hepatic steatosis. Bilateral renal atrophy.  No hydronephrosis. Electronically Signed   By: Charline Bills M.D.   On: 06/20/2018 10:45   Dg Chest Port 1 View  Result Date: 06/20/2018 CLINICAL DATA:  Central line placement EXAM: PORTABLE CHEST 1 VIEW COMPARISON:  06/19/2018 FINDINGS: Mild bilateral interstitial thickening. No pleural effusion or pneumothorax. No focal consolidation. Stable cardiomegaly. Left jugular central venous catheter with the tip projecting over the cavoatrial junction. No acute osseous abnormality. IMPRESSION: 1. Left jugular central venous catheter with the tip projecting over the cavoatrial junction. 2. Cardiomegaly with mild pulmonary vascular congestion. Electronically Signed   By: Elige Ko   On: 06/20/2018 10:03   Dg Chest Port 1 View  Result Date: 06/19/2018 CLINICAL DATA:  Dyspnea EXAM: PORTABLE CHEST 1 VIEW COMPARISON:  06/19/2018 chest radiograph. FINDINGS: Stable cardiomediastinal silhouette with mild cardiomegaly. No pneumothorax. Small right pleural effusion, stable. No left pleural effusion. No overt pulmonary edema. Mild bibasilar atelectasis. IMPRESSION: 1. Stable mild cardiomegaly without overt pulmonary edema. 2. Stable small right pleural effusion and mild bibasilar atelectasis. Electronically Signed   By: Delbert Phenix M.D.   On: 06/19/2018 22:02    Medications: Infusions: . sodium chloride Stopped (06/20/18 0700)  . amiodarone 30 mg/hr (06/21/18 0836)  . ceFEPime (MAXIPIME) IV 200 mL/hr at 06/20/18 1142  . furosemide (LASIX) infusion 20 mg/hr (06/21/18 0800)  . heparin 1,050 Units/hr (06/21/18 0837)  . milrinone 0.25 mcg/kg/min (06/21/18 0800)  . norepinephrine (LEVOPHED) Adult infusion 0 mcg/min (06/21/18 0115)    Scheduled Medications: . sodium chloride   Intravenous Once  . ipratropium  0.5 mg Nebulization Q6H  .  multivitamin with minerals  1 tablet Oral Daily  . patiromer  16.8 g Oral Once  . sodium chloride flush  3 mL Intravenous Q12H  . sodium polystyrene  30 g Rectal Once    have reviewed scheduled and prn medications.  Physical Exam: General: Ill-looking male lying in bed, not in distress Heart: Tachycardic, S1-S2 normal Lungs: Bibasilar decreased breath sound, no crackle Abdomen:soft, Non-tender, non-distended Extremities: Lower extremities pitting edema ++ Neuro: Alert awake and following commands  Dron Prasad Bhandari 06/21/2018,9:15 AM  LOS: 2 days

## 2018-06-21 NOTE — Progress Notes (Addendum)
PULMONARY / CRITICAL CARE MEDICINE  Name: Martin [name] MRN: [id number] DOB: 07-23-1946    ADMISSION DATE:  06/19/2018 CONSULTATION DATE:  06/19/18  REFERRING MD :  Martin [name]  CHIEF COMPLAINT:  SOB   BRIEF SUMMARY:  Martin [name] is a 72 y.o. male with a PMH as outlined below including but not limited to NICM, sCHF (EF 20% with biventricular failure from ECHO at [facility] 6/24), A.fib/flutter not on anticoagulation (pt preference due to hx large rectus sheath hematoma).  He presented to [facility] 6/24 with fluid accumulation and SOB after being out of his medication for a couple of weeks.  While there, he was found to have AFRVR and was treated with IV diltiazem.  He also had CTA due to elevated D-dimer.  CTA demonstrated RLL PE.  He was subsequently started on eliquis.  Per pt, he was playing lottery and then went to a party on 6/24 and was feeling fine, then suddenly had SOB, tachycardia, tachypnea.  Night of 6/24, he remained tachycardic and had worsening dyspnea.  He was therefore transferred to [facility] for further evaluation and management.  He was admitted by [facility].  He had tachypnea; therefore, ABG was obtained which demonstrated met + resp acidosis (7.19 / 26 / 83 / 9).  He was placed on BiPAP and PCCM was then called in consultation due to concerns of ABG and possibility of deterioration.   SUBJECTIVE:   Pt states he is feeling a little bit better than yesterday Denies SOB, chest pain, or palpitations Denies fever, chills, or cough Afebrile Urine output 7.6L yesterday Off Levophed Remains on Milrinone, Lasix, Amiodarone, and Heparin gtt's  VITAL SIGNS: Temp:  [96.3 F (35.7 C)-98.9 F (37.2 C)] 98.9 F (37.2 C) (06/27 0739) Pulse Rate:  [36-148] 128 (06/27 0600) Resp:  [13-34] 17 (06/27 0600) BP: (51-159)/(16-112) 127/88 (06/27 0600) SpO2:  [90 %-100 %] 100 % (06/27 0600) Weight:  [196 lb 10.4 oz (89.2 kg)] 196 lb 10.4 oz (89.2 kg) (06/27 0600)  PHYSICAL  EXAMINATION: General: Acute on chronically ill appearing male, laying in bed, in no acute distress    HEENT: Atraumatic, normocephalic, neck supple, MM pink/moist Neuro: Alert and oriented x4, follows commands, no focal deficits   CV: Tachycardia, irregularly irregular rhythm, No M/R/G, 2+ radial pulses, 1+ pedal pulses bilaterally PULM: Clear breath sounds bilaterally, even, non-labored, diminished RLL   RF:VOHK, non-tender, BS+ x4   Extremities: Warm, dry. 1+ bilateral LE edema  Skin: No obvious rashes, lesions, or ulcerations   Recent Labs  Lab 06/20/18 0815 06/20/18 1806 06/21/18 0347  NA 133* 134* 133*  K 5.9* 4.4 3.6  CL 99 93* 87*  CO2 14* 22 30  BUN 71* 82* 88*  CREATININE 4.30* 4.63* 4.59*  GLUCOSE 128* 167* 129*   Recent Labs  Lab 06/20/18 0113  HGB 14.7  HCT 47.3  WBC 17.8*  PLT 120*   US Abdomen Complete  Result Date: 06/20/2018 CLINICAL DATA:  Acute kidney injury, abdominal mass (rectus sheath hematoma) EXAM: ABDOMEN ULTRASOUND COMPLETE COMPARISON:  CT abdomen/pelvis dated 04/08/2012 FINDINGS: Gallbladder: Layering small gallstones. Mild gallbladder wall thickening. No pericholecystic fluid. Negative sonographic Murphy's sign. Common bile duct: Diameter: 5 mm Liver: Hyperechoic hepatic parenchyma. No focal hepatic lesion is seen. Portal vein is patent on color Doppler imaging with normal direction of blood flow towards the liver. IVC: No abnormality visualized. Pancreas: Visualized portion unremarkable. Spleen: Size and appearance within normal limits. Right Kidney: Length: 6.7 cm.  No mass or hydronephrosis.  Left Kidney: Length: 6.8 cm.  No mass or hydronephrosis. Abdominal aorta: No aneurysm visualized. Other findings: Right abdominal wall mass measuring 14.6 x 9.3 x 8.3 cm, corresponding to the previously characterized rectus sheath hematoma. This measured 17.5 x 11.6 x 15.0 cm on prior CT. Small right pleural effusion. IMPRESSION: 14.6 cm right rectus sheath  hematoma, previously 17.5 cm. Cholelithiasis, without associated sonographic findings to suggest acute cholecystitis. Suspected mild hepatic steatosis. Bilateral renal atrophy.  No hydronephrosis. Electronically Signed   By: Julian Hy M.D.   On: 06/20/2018 10:45   Dg Chest Port 1 View  Result Date: 06/20/2018 CLINICAL DATA:  Central line placement EXAM: PORTABLE CHEST 1 VIEW COMPARISON:  06/19/2018 FINDINGS: Mild bilateral interstitial thickening. No pleural effusion or pneumothorax. No focal consolidation. Stable cardiomegaly. Left jugular central venous catheter with the tip projecting over the cavoatrial junction. No acute osseous abnormality. IMPRESSION: 1. Left jugular central venous catheter with the tip projecting over the cavoatrial junction. 2. Cardiomegaly with mild pulmonary vascular congestion. Electronically Signed   By: Kathreen Devoid   On: 06/20/2018 10:03   Dg Chest Port 1 View  Result Date: 06/19/2018 CLINICAL DATA:  Dyspnea EXAM: PORTABLE CHEST 1 VIEW COMPARISON:  06/19/2018 chest radiograph. FINDINGS: Stable cardiomediastinal silhouette with mild cardiomegaly. No pneumothorax. Small right pleural effusion, stable. No left pleural effusion. No overt pulmonary edema. Mild bibasilar atelectasis. IMPRESSION: 1. Stable mild cardiomegaly without overt pulmonary edema. 2. Stable small right pleural effusion and mild bibasilar atelectasis. Electronically Signed   By: Ilona Sorrel M.D.   On: 06/19/2018 22:02    STUDIES:  CT chest 6/25 > RLL PE, large right pleural effusion with atelectasis. CXR 6/25 > cardiomegaly, small right effusion, mild atx. Renal US 6/25 >  [place] Abd 6/26 > 14.6 cm right rectus sheath hematoma, previously 17.5 cm. Cholelithiasis, without associated sonographic findings to suggest acute cholecystitis. Suspected mild hepatic steatosis. Bilateral renal atrophy.  No hydronephrosis. Bilateral LE venous Doppler US 6/26>> negative for DVT  SIGNIFICANT EVENTS  6/24  Admitted to [facility]. 6/25 Transferred to [facility].  ANTIBIOTICS Cefepime 6/26 >>   CULTURES BCx2 6/26 >>  UA 6/26 >>  Sputum 6/26 >>   ASSESSMENT / PLAN:  Tachypnea - compensatory given metabolic acidosis.  Adequate compensation (per [place], expected pCO2 should be in 18 - 22 range)>>improved P: Supplemental O2 prn to maintain O2 sats >92% Provide supportive care Follow CXR  RLL PE - on CT chest from [facility] 6/24. P: Continue Heparin gtt, rate adjustment per pharmacy   Right pleural effusion - presumed transudative in setting CHF. P: Consider Thoracentesis today 6/27, if performed today will need to hold heparin prior to procedure, Bedside US 6/27 reveals pocket of fluid on right, will discuss with Dr. Neva Seat Metabolic Acidosis - presumed due to renal failure. ? Cardiogenic shock>>improving Hyponatremia. Hyperkalemia - likely driven by acidosis + renal failure>>resolved Acute on Chronic Kidney Disease P: Nephrology following, appreciate input Sodium bicarb infusion completed Trend BMP Monitor I&O's Ensure adequate renal perfusion Avoid nephrotoxic agents as able Replace electrolytes as indicated  IV diuresis per HF team & Nephrology   Shock - suspected cardiogenic but rule out infectious component -PCT 3.09  Acute on Chronic systolic CHF (Echo from [facility] 6/24 with EF 20% and biventricular failure). Troponin bump - suspect demand. Hx A.fib / flutter (not on anticoagulation due to pt preference given hx rectal sheath hematoma) NICM. P: ICU monitoring CVP q4h Continue Milrinone, Levophed, &  Lasix per CHF service Follow Troponin, BMP Monitor fever curve Trend CBC's Follow cultures as above Continue Cefepime  Consider Thoracentesis to culture fluid from pleural effusion  Abdominal mass - pt attributes to rectal sheath hematoma (though this was 6 years ago). -F/u Abdominal US 6/26>>14.6 cm right rectus sheath hematoma, previously  17.5 cm. P: Continue to monitor   Darel Hong, AGACNP-BC [facility] Pgr: [phone] or if no answer [phone] 06/21/2018, 8:19 AM  Attending Note:  72 year old male with extensive cardiac history who presents with renal failure, acute decompensated heart failure and cardiogenic shock.  Patient was transferred to the ICU for pressors use and was started on levophed as well as a lasix drip.  Overnight, the patient responded nicely to above and made 7 liters of urine overnight.  No need for HD at this time.  On bedside U/S there is a right sided effusion that we are concerned could be the source of infection.  On exam, he is alert and interactive but continues to need high dose steroids.  I reviewed CXR myself, effusion noted.  Discussed with PCCM-NP.  Will continue pressors and lasix drip.  Will not place HD catheter at this time.  Maintain in the ICU for circulatory shock.  PCCM will continue to follow.  The patient is critically ill with multiple organ systems failure and requires high complexity decision making for assessment and support, frequent evaluation and titration of therapies, application of advanced monitoring technologies and extensive interpretation of multiple databases.   Critical Care Time devoted to patient care services described in this note is  32  Minutes. This time reflects time of care of this signee Dr Jennet Maduro. This critical care time does not reflect procedure time, or teaching time or supervisory time of PA/NP/Med student/Med Resident etc but could involve care discussion time.  Rush Farmer, M.D. [facility] Pulmonary/Critical Care Medicine. Pager: [phone]. After hours pager: [phone].

## 2018-06-21 NOTE — Progress Notes (Signed)
Decreased sat. Recruitment done per Dr. Tyrone Sage.

## 2018-06-21 NOTE — Progress Notes (Signed)
Advanced Heart Failure Rounding Note  PCP-Cardiologist: No primary care provider on file.   Subjective:    Remains on NE and milrinone 0.25. Also lasix 20/hr.   Weaning NE as tolerated. Excellent diuresis. Weight down 7 pounds. Creatinine still high at 4.6 but seems to be turning the corner. CVP 5-6  Remains in rapid with HRs in 130-150s despite several amio boluses and drip at 60/h  Breathing better. Denies CP, orthopnea or PND.   Attempted R thoracentesis but minimal fluid on u/s so aborted.    Objective:   Weight Range: 196 lb 10.4 oz (89.2 kg) Body mass index is 31.74 kg/m.   Vital Signs:   Temp:  [97.5 F (36.4 C)-98.9 F (37.2 C)] 98.2 F (36.8 C) (06/27 1118) Pulse Rate:  [57-160] 72 (06/27 1500) Resp:  [13-25] 19 (06/27 1500) BP: (88-147)/(60-112) 117/86 (06/27 1500) SpO2:  [95 %-100 %] 95 % (06/27 1500) Weight:  [196 lb 10.4 oz (89.2 kg)] 196 lb 10.4 oz (89.2 kg) (06/27 0600) Last BM Date: 06/19/18  Weight change: Filed Weights   06/20/18 0300 06/21/18 0600  Weight: 203 lb 4.2 oz (92.2 kg) 196 lb 10.4 oz (89.2 kg)    Intake/Output:   Intake/Output Summary (Last 24 hours) at 06/21/2018 1558 Last data filed at 06/21/2018 1500 Gross per 24 hour  Intake 4048.13 ml  Output 16109 ml  Net -7251.87 ml      Physical Exam    General:  Lying in bed. No resp difficulty HEENT: Normal Neck: Supple. JVP 6. LIJ TLC Carotids 2+ bilat; no bruits. No lymphadenopathy or thyromegaly appreciated. Cor: PMI nondisplaced. IRR tachy + s3 Lungs: Clear anteriorly Abdomen: Soft, nontender, nondistended.  + rectal sheath hematoma (chronic) No hepatosplenomegaly. No bruits or masses. Good bowel sounds. Extremities: No cyanosis, clubbing, rash, 2+ edema Neuro: Alert & orientedx3, cranial nerves grossly intact. moves all 4 extremities w/o difficulty. Affect pleasant   Telemetry   Af 120-150 Personally reviewed   Labs    CBC Recent Labs    06/20/18 0113  WBC  17.8*  HGB 14.7  HCT 47.3  MCV 96.3  PLT 120*   Basic Metabolic Panel Recent Labs    60/45/40 1806 06/20/18 2328 06/21/18 0347  NA 134*  --  133*  K 4.4  --  3.6  CL 93*  --  87*  CO2 22  --  30  GLUCOSE 167*  --  129*  BUN 82*  --  88*  CREATININE 4.63*  --  4.59*  CALCIUM 7.6*  --  7.2*  MG  --  2.2 2.2  PHOS  --   --  6.9*   Liver Function Tests No results for input(s): AST, ALT, ALKPHOS, BILITOT, PROT, ALBUMIN in the last 72 hours. No results for input(s): LIPASE, AMYLASE in the last 72 hours. Cardiac Enzymes Recent Labs    06/20/18 0420 06/20/18 0815 06/20/18 1806  TROPONINI 0.27* 0.32* 0.28*    BNP: BNP (last 3 results) Recent Labs    06/19/18 2128  BNP 1,948.0*    ProBNP (last 3 results) No results for input(s): PROBNP in the last 8760 hours.   D-Dimer No results for input(s): DDIMER in the last 72 hours. Hemoglobin A1C Recent Labs    06/20/18 0113  HGBA1C 6.1*   Fasting Lipid Panel Recent Labs    06/20/18 0113  CHOL 178  HDL 30*  LDLCALC 121*  TRIG 136  CHOLHDL 5.9   Thyroid Function Tests No results for  input(s): TSH, T4TOTAL, T3FREE, THYROIDAB in the last 72 hours.  Invalid input(s): FREET3  Other results:   Imaging    Dg Chest Port 1 View  Result Date: 06/21/2018 CLINICAL DATA:  Shortness of breath and pleural effusion EXAM: PORTABLE CHEST 1 VIEW COMPARISON:  06/20/2018 FINDINGS: Left jugular central line is again seen. Cardiac shadow is enlarged but stable. More prominent right-sided pleural effusion is noted. Central vascular congestion has increased somewhat in the interval from the prior exam. IMPRESSION: Increasing congestive failure with new moderate size right-sided pleural effusion. Electronically Signed   By: Alcide Clever M.D.   On: 06/21/2018 09:15      Medications:     Scheduled Medications: . sodium chloride   Intravenous Once  . multivitamin with minerals  1 tablet Oral Daily  . patiromer  16.8 g Oral  Once  . sodium chloride flush  3 mL Intravenous Q12H  . sodium polystyrene  30 g Rectal Once     Infusions: . sodium chloride Stopped (06/20/18 0700)  . amiodarone 60 mg/hr (06/21/18 1500)  . ceFEPime (MAXIPIME) IV 200 mL/hr at 06/21/18 0929  . furosemide (LASIX) infusion 20 mg/hr (06/21/18 1553)  . heparin 1,050 Units/hr (06/21/18 1501)  . milrinone 0.25 mcg/kg/min (06/21/18 1500)  . norepinephrine (LEVOPHED) Adult infusion 0 mcg/min (06/21/18 0115)     PRN Medications:  sodium chloride, levalbuterol, ondansetron (ZOFRAN) IV, sodium chloride flush    Patient Profile    Mr. Kidd is a 72 yo with PAF/FL, systolic HF due to NICM, HTN,previous rectus sheath hematoma who was transferred from [facility] due to cardiogenic shock in setting of recurrent AF.    Assessment/Plan   1. Cardiogenic shock, recurrent - EF 20-25% by Echo at [facility] 6/19 - Likely NICM due to tachy-induced CM which is recurrent for him  - Central access placed with initial co-ox 40% now up to 82% (?) - Continue milrinone. Wean NE - Volume status much improved with CVP down to 6 but still with significant peripheral edema. Place TED hose. Reduce lasix gtt to 15 - Now that he is somewhat improved will plan DC-CV to restore NSR as he can never tolerate AFL/A tach. This will not be elective as he will not get out of shock without it so will not need TEE - Continue heparin. Will need to discuss warfarin vs DOAC with him  - No ACE/ARB with AKI. No bblocker  2. AKI - Due to #1 - Improving with hemodynamic support. Creatinine 4.6 but hopefully turing the corner. Hyperkalemia resolved.   3. Acute respiratory failure - Improved.   4. Paroxysmal AFL/atiral tach - Recurrent. Remains quite fast despite IV amio .Management as above. DC-CV scheduled for tomorrw at 1pm - Continue amio   5. Moderate right pleural effusion with elevated PCT - no frank symptoms of infection but CT appearance  concerning and PCT is up - On cefipime. Cx remains negative.  - CCM attempted thoracentesis today but no fluid left on u/s - Repeat CXR  6. Rectus sheath hematoma - chronic. No change  7. Pulmonary embolus - I have reviewed personally doubt this is present Will need AC for AF anyway    CRITICAL CARE Performed by: [name]  Total critical care time: 35 minutes  Critical care time was exclusive of separately billable procedures and treating other patients.  Critical care was necessary to treat or prevent imminent or life-threatening deterioration.  Critical care was time spent personally by me (independent of midlevel providers or  residents) on the following activities: development of treatment plan with patient and/or surrogate as well as nursing, discussions with consultants, evaluation of patient's response to treatment, examination of patient, obtaining history from patient or surrogate, ordering and performing treatments and interventions, ordering and review of laboratory studies, ordering and review of radiographic studies, pulse oximetry and re-evaluation of patient's condition.    Length of Stay: 2  [name], MD  06/21/2018, 3:58 PM  Advanced Heart Failure Team Pager [phone] (M-F; 7a - 4p)  Please contact [facility] for night-coverage after hours (4p -7a ) and weekends on [email or web address]

## 2018-06-21 NOTE — Progress Notes (Signed)
ANTICOAGULATION CONSULT NOTE  Pharmacy Consult for Apixaban>Heparin  Indication: pulmonary embolus, afib  Allergies  Allergen Reactions  . Amiodarone Rash    Rash resolved when amio stopped 2013, retried in 2017 with same reaction    Patient Measurements: Ht: 5'6" Wt: 92.2 kg Heparin Dose Weight: 83.5 kg  Vital Signs: Temp: 98.2 F (36.8 C) (06/27 1118) Temp Source: Oral (06/27 1118) BP: 88/74 (06/27 1200) Pulse Rate: 60 (06/27 1200)  Labs: Recent Labs    06/20/18 0113 06/20/18 0420 06/20/18 0815 06/20/18 1806 06/20/18 2328 06/21/18 0347 06/21/18 0835  HGB 14.7  --   --   --   --   --   --   HCT 47.3  --   --   --   --   --   --   PLT 120*  --   --   --   --   --   --   APTT  --   --  83*  --  118*  --  94*  LABPROT 42.1* 45.2* 43.4*  --   --  28.1*  --   INR 4.46* 4.88* 4.63*  --   --  2.66  --   HEPARINUNFRC  --   --  >2.20*  --  >2.20*  --  >2.20*  CREATININE 3.76*  --  4.30* 4.63*  --  4.59*  --   TROPONINI  --  0.27* 0.32* 0.28*  --   --   --     Estimated Creatinine Clearance: 15.9 mL/min (A) (by C-G formula based on SCr of 4.59 mg/dL (H)).    Assessment: 72 y/o transfer from outside hospital with afib and PE, pt started on apixaban for PE in RLL without heart strain on CTA at [facility] (last dose on 6/25 in AM). Transitioned to heparin.   APTT at goal this AM.  No overt bleeding or complications noted.  Goal of Therapy: Heparin level 0.3-0.7 units/mL aPTT 66-102 secs  Monitor platelets by anticoagulation protocol: Yes   Plan:  Continue IV heparin at current rate. Confirm PTT this afternoon. Daily heparin level, CBC, and PTT. F/u plans to resume oral anticoagulation eventually.  Jenetta Downer, [facility] Clinical Pharmacist Phone [phone]  06/21/2018 12:51 PM

## 2018-06-22 ENCOUNTER — Inpatient Hospital Stay (HOSPITAL_COMMUNITY): Payer: Medicare HMO | Admitting: Certified Registered"

## 2018-06-22 ENCOUNTER — Encounter (HOSPITAL_COMMUNITY): Payer: Self-pay | Admitting: Certified Registered"

## 2018-06-22 ENCOUNTER — Encounter (HOSPITAL_COMMUNITY)
Admission: AD | Disposition: A | Discharge status: Home / Self Care | Payer: Self-pay | Source: Other Acute Inpatient Hospital | Attending: Internal Medicine

## 2018-06-22 DIAGNOSIS — N189 Chronic kidney disease, unspecified: Secondary | ICD-10-CM

## 2018-06-22 DIAGNOSIS — I4891 Unspecified atrial fibrillation: Secondary | ICD-10-CM

## 2018-06-22 DIAGNOSIS — J90 Pleural effusion, not elsewhere classified: Secondary | ICD-10-CM

## 2018-06-22 DIAGNOSIS — I5023 Acute on chronic systolic (congestive) heart failure: Secondary | ICD-10-CM

## 2018-06-22 HISTORY — PX: CARDIOVERSION: SHX1299

## 2018-06-22 LAB — BPAM FFP
Blood Product Expiration Date: 201907012359
Blood Product Expiration Date: 201907012359
Blood Product Expiration Date: 201907012359
ISSUE DATE / TIME: 201906260800
ISSUE DATE / TIME: 201906260800
ISSUE DATE / TIME: 201906260800
UNIT TYPE AND RH: 5100
UNIT TYPE AND RH: 5100
Unit Type and Rh: 5100

## 2018-06-22 LAB — PREPARE FRESH FROZEN PLASMA
UNIT DIVISION: 0
Unit division: 0
Unit division: 0

## 2018-06-22 LAB — CBC
HEMATOCRIT: 46.8 % (ref 39.0–52.0)
HEMOGLOBIN: 15.7 g/dL (ref 13.0–17.0)
MCH: 29.9 pg (ref 26.0–34.0)
MCHC: 33.5 g/dL (ref 30.0–36.0)
MCV: 89.1 fL (ref 78.0–100.0)
Platelets: 115 10*3/uL — ABNORMAL LOW (ref 150–400)
RBC: 5.25 MIL/uL (ref 4.22–5.81)
RDW: 15.5 % (ref 11.5–15.5)
WBC: 11.7 10*3/uL — AB (ref 4.0–10.5)

## 2018-06-22 LAB — BASIC METABOLIC PANEL
Anion gap: 18 — ABNORMAL HIGH (ref 5–15)
BUN: 83 mg/dL — ABNORMAL HIGH (ref 8–23)
CALCIUM: 7.9 mg/dL — AB (ref 8.9–10.3)
CO2: 42 mmol/L — ABNORMAL HIGH (ref 22–32)
CREATININE: 4.07 mg/dL — AB (ref 0.61–1.24)
Chloride: 73 mmol/L — ABNORMAL LOW (ref 98–111)
GFR calc Af Amer: 16 mL/min — ABNORMAL LOW (ref >60)
GFR, EST NON AFRICAN AMERICAN: 14 mL/min — AB (ref >60)
Glucose, Bld: 152 mg/dL — ABNORMAL HIGH (ref 70–99)
Potassium: 3 mmol/L — ABNORMAL LOW (ref 3.5–5.1)
SODIUM: 133 mmol/L — AB (ref 135–145)

## 2018-06-22 LAB — COOXEMETRY PANEL
Carboxyhemoglobin: 1.3 % (ref 0.5–1.5)
Methemoglobin: 0.9 % (ref 0.0–1.5)
O2 Saturation: 68.8 %
Total hemoglobin: 16.1 g/dL — ABNORMAL HIGH (ref 12.0–16.0)

## 2018-06-22 LAB — PROCALCITONIN: PROCALCITONIN: 2.61 ng/mL

## 2018-06-22 LAB — PROTIME-INR
INR: 1.38
Prothrombin Time: 16.9 seconds — ABNORMAL HIGH (ref 11.4–15.2)

## 2018-06-22 LAB — APTT
APTT: 102 s — AB (ref 24–36)
aPTT: 127 seconds — ABNORMAL HIGH (ref 24–36)

## 2018-06-22 LAB — MAGNESIUM: MAGNESIUM: 2.4 mg/dL (ref 1.7–2.4)

## 2018-06-22 LAB — HEPARIN LEVEL (UNFRACTIONATED): Heparin Unfractionated: 1.98 IU/mL — ABNORMAL HIGH (ref 0.30–0.70)

## 2018-06-22 SURGERY — CARDIOVERSION
Anesthesia: General

## 2018-06-22 MED ORDER — LIDOCAINE 2% (20 MG/ML) 5 ML SYRINGE
INTRAMUSCULAR | Status: DC | PRN
  Administered 2018-06-22: 80 mg via INTRAVENOUS

## 2018-06-22 MED ORDER — SODIUM CHLORIDE 0.9% FLUSH
10.0000 mL | Freq: Two times a day (BID) | INTRAVENOUS | Status: DC
  Administered 2018-06-23: 10 mL
  Administered 2018-06-23: 30 mL
  Administered 2018-06-24 – 2018-06-28 (×2): 10 mL

## 2018-06-22 MED ORDER — POTASSIUM CHLORIDE CRYS ER 20 MEQ PO TBCR
40.0000 meq | EXTENDED_RELEASE_TABLET | Freq: Once | ORAL | Status: AC
  Administered 2018-06-22: 40 meq via ORAL
  Filled 2018-06-22: qty 2

## 2018-06-22 MED ORDER — PROPOFOL 10 MG/ML IV BOLUS
INTRAVENOUS | Status: DC | PRN
  Administered 2018-06-22: 60 mg via INTRAVENOUS

## 2018-06-22 MED ORDER — CHLORHEXIDINE GLUCONATE CLOTH 2 % EX PADS
6.0000 | MEDICATED_PAD | Freq: Every day | CUTANEOUS | Status: DC
  Administered 2018-06-23 – 2018-06-24 (×2): 6 via TOPICAL

## 2018-06-22 MED ORDER — AMIODARONE LOAD VIA INFUSION
150.0000 mg | Freq: Once | INTRAVENOUS | Status: AC
  Administered 2018-06-22: 150 mg via INTRAVENOUS
  Filled 2018-06-22: qty 83.34

## 2018-06-22 MED ORDER — SODIUM CHLORIDE 0.9% FLUSH: 10.0000 mL | INTRAVENOUS | Status: DC | PRN

## 2018-06-22 MED ORDER — PHENYLEPHRINE 40 MCG/ML (10ML) SYRINGE FOR IV PUSH (FOR BLOOD PRESSURE SUPPORT)
PREFILLED_SYRINGE | INTRAVENOUS | Status: DC | PRN
  Administered 2018-06-22: 80 ug via INTRAVENOUS

## 2018-06-22 NOTE — Progress Notes (Signed)
ANTICOAGULATION CONSULT NOTE  Pharmacy Consult for Apixaban>Heparin  Indication: pulmonary embolus, afib  Allergies  Allergen Reactions  . Amiodarone Rash    Rash resolved when amio stopped 2013, retried in 2017 with same reaction    Patient Measurements: Ht: 5'6" Wt: 92.2 kg Heparin Dose Weight: 83.5 kg  Vital Signs: Temp: 98 F (36.7 C) (06/28 1252) Temp Source: Oral (06/28 1252) BP: 104/73 (06/28 1252) Pulse Rate: 142 (06/28 1252)  Labs: Recent Labs    06/20/18 0113 06/20/18 0420  06/20/18 0815 06/20/18 1806 06/20/18 2328 06/21/18 0347 06/21/18 0835 06/21/18 1654 06/21/18 2315 06/22/18 0330 06/22/18 1025  HGB 14.7  --   --   --   --   --   --   --  13.5  --  15.7  --   HCT 47.3  --   --   --   --   --   --   --  40.8  --  46.8  --   PLT 120*  --   --   --   --   --   --   --  100*  --  115*  --   APTT  --   --    < > 83*  --  118*  --  94*  --  127*  --  102*  LABPROT 42.1* 45.2*  --  43.4*  --   --  28.1*  --   --   --  16.9*  --   INR 4.46* 4.88*  --  4.63*  --   --  2.66  --   --   --  1.38  --   HEPARINUNFRC  --   --    < > >2.20*  --  >2.20*  --  >2.20*  --   --   --  1.98*  CREATININE 3.76*  --   --  4.30* 4.63*  --  4.59*  --   --   --  4.07*  --   TROPONINI  --  0.27*  --  0.32* 0.28*  --   --   --   --   --   --   --    < > = values in this interval not displayed.    Estimated Creatinine Clearance: 17.1 mL/min (A) (by C-G formula based on SCr of 4.07 mg/dL (H)).    Assessment: 72 y/o transfer from outside hospital with afib and PE, pt started on apixaban for PE in RLL without heart strain on CTA at [facility] (last dose on 6/25 in AM). Transitioned to heparin.   APTT at goal this AM.  Heparin level remains falsely elevated from apixaban.  No overt bleeding or complications noted.  Goal of Therapy: Heparin level 0.3-0.7 units/mL aPTT 66-102 secs  Monitor platelets by anticoagulation protocol: Yes   Plan:  Continue IV heparin at current  rate. Daily heparin level, CBC, and PTT. F/u plans to resume oral anticoagulation a day or two after DCCV.  Jenetta Downer, [facility] Clinical Pharmacist Phone [phone]  06/22/2018 1:04 PM

## 2018-06-22 NOTE — Transfer of Care (Signed)
Immediate Anesthesia Transfer of Care Note  Patient: Martin [name]  Procedure(s) Performed: CARDIOVERSION (N/A )  Patient Location: Endoscopy Unit  Anesthesia Type:General  Level of Consciousness: drowsy  Airway & Oxygen Therapy: Patient Spontanous Breathing and Patient connected to nasal cannula oxygen  Post-op Assessment: Report given to RN and Post -op Vital signs reviewed and stable  Post vital signs: Reviewed and stable  Last Vitals:  Vitals Value Taken Time  BP    Temp    Pulse    Resp    SpO2      Last Pain:  Vitals:   06/22/18 1252  TempSrc: Oral  PainSc: 0-No pain         Complications: No apparent anesthesia complications

## 2018-06-22 NOTE — Progress Notes (Signed)
ANTICOAGULATION CONSULT NOTE - Follow Up Consult  Pharmacy Consult for heparin Indication: atrial fibrillation and pulmonary embolus  Labs: Recent Labs    06/20/18 0113 06/20/18 0420  06/20/18 0815 06/20/18 1806 06/20/18 2328 06/21/18 0347 06/21/18 0835 06/21/18 1654 06/21/18 2315  HGB 14.7  --   --   --   --   --   --   --  13.5  --   HCT 47.3  --   --   --   --   --   --   --  40.8  --   PLT 120*  --   --   --   --   --   --   --  100*  --   APTT  --   --    < > 83*  --  118*  --  94*  --  127*  LABPROT 42.1* 45.2*  --  43.4*  --   --  28.1*  --   --   --   INR 4.46* 4.88*  --  4.63*  --   --  2.66  --   --   --   HEPARINUNFRC  --   --   --  >2.20*  --  >2.20*  --  >2.20*  --   --   CREATININE 3.76*  --   --  4.30* 4.63*  --  4.59*  --   --   --   TROPONINI  --  0.27*  --  0.32* 0.28*  --   --   --   --   --    < > = values in this interval not displayed.    Assessment: 72yo male supratherapeutic on heparin after one PTT at goal; no gtt issues, some oozing per RN but no frank bleeding.  Goal of Therapy:  aPTT 66-102 seconds   Plan:  Will decrease heparin gtt by ~2 units/kg/hr to 900 units/hr and check PTT in 8 hours.    Vernard Gambles, PharmD, BCPS  06/22/2018,2:43 AM

## 2018-06-22 NOTE — Progress Notes (Addendum)
PULMONARY / CRITICAL CARE MEDICINE  Name: Martin [name] MRN: [id number] DOB: 1946-01-01    ADMISSION DATE:  06/19/2018 CONSULTATION DATE:  06/19/18  REFERRING MD :  Blaine Hamper  CHIEF COMPLAINT:  SOB   BRIEF SUMMARY:  Martin [name] is a 72 y.o. male with a PMH as outlined below including but not limited to NICM, sCHF (EF 20% with biventricular failure from ECHO at [facility] 6/24), A.fib/flutter not on anticoagulation (pt preference due to hx large rectus sheath hematoma).  He presented to [facility] 6/24 with fluid accumulation and SOB after being out of his medication for a couple of weeks.  While there, he was found to have AFRVR and was treated with IV diltiazem.  He also had CTA due to elevated D-dimer.  CTA demonstrated RLL PE.  He was subsequently started on eliquis.  Per pt, he was playing lottery and then went to a party on 6/24 and was feeling fine, then suddenly had SOB, tachycardia, tachypnea.  Night of 6/24, he remained tachycardic and had worsening dyspnea.  He was therefore transferred to [facility] for further evaluation and management.  He was admitted by [facility].  He had tachypnea; therefore, ABG was obtained which demonstrated met + resp acidosis (7.19 / 26 / 83 / 9).  He was placed on BiPAP and PCCM was then called in consultation due to concerns of ABG and possibility of deterioration.   SUBJECTIVE:   No events overnight On 3L [place] Levophed off Remains on Amiodarone, Lasix, & Milrinone infusions Denies SOB, chest pain, or palpitations Reports cough, thin tan colored secretions  VITAL SIGNS: Temp:  [97.6 F (36.4 C)-98.6 F (37 C)] 97.6 F (36.4 C) (06/28 0700) Pulse Rate:  [41-160] 54 (06/28 0700) Resp:  [13-25] 16 (06/28 0700) BP: (88-127)/(63-97) 93/71 (06/28 0700) SpO2:  [91 %-100 %] 99 % (06/28 0700) Weight:  [178 lb 14.4 oz (81.1 kg)] 178 lb 14.4 oz (81.1 kg) (06/28 0600)  PHYSICAL EXAMINATION: General: Acute on chronically ill appearing male, laying in bed, on 3L  [place], in no acute distress    HEENT: Atraumatic, normocephalic, neck supple, MM moist/pink Neuro: A&O x4, follows commands, no focal deficits CV: Tachycardia, irregularly irregular rhythm, No M/R/G, 2+ pulses throughout  PULM: Clear breath sounds bilaterally, no wheezing, even, non-labored, no assessory muscle use  GI: Soft, non-tender, BS+ x4  Extremities: No deformities, normal bulk and tone,1+ bilateral LE edema  Skin: Warm, dry.  No obvious rashes, lesions, or ulcerations   Recent Labs  Lab 06/20/18 1806 06/21/18 0347 06/22/18 0330  NA 134* 133* 133*  K 4.4 3.6 3.0*  CL 93* 87* 73*  CO2 22 30 42*  BUN 82* 88* 83*  CREATININE 4.63* 4.59* 4.07*  GLUCOSE 167* 129* 152*   Recent Labs  Lab 06/20/18 0113 06/21/18 1654 06/22/18 0330  HGB 14.7 13.5 15.7  HCT 47.3 40.8 46.8  WBC 17.8* 12.2* 11.7*  PLT 120* 100* 115*   US Abdomen Complete  Result Date: 06/20/2018 CLINICAL DATA:  Acute kidney injury, abdominal mass (rectus sheath hematoma) EXAM: ABDOMEN ULTRASOUND COMPLETE COMPARISON:  CT abdomen/pelvis dated 04/08/2012 FINDINGS: Gallbladder: Layering small gallstones. Mild gallbladder wall thickening. No pericholecystic fluid. Negative sonographic Murphy's sign. Common bile duct: Diameter: 5 mm Liver: Hyperechoic hepatic parenchyma. No focal hepatic lesion is seen. Portal vein is patent on color Doppler imaging with normal direction of blood flow towards the liver. IVC: No abnormality visualized. Pancreas: Visualized portion unremarkable. Spleen: Size and appearance within normal limits. Right Kidney: Length:  6.7 cm.  No mass or hydronephrosis. Left Kidney: Length: 6.8 cm.  No mass or hydronephrosis. Abdominal aorta: No aneurysm visualized. Other findings: Right abdominal wall mass measuring 14.6 x 9.3 x 8.3 cm, corresponding to the previously characterized rectus sheath hematoma. This measured 17.5 x 11.6 x 15.0 cm on prior CT. Small right pleural effusion. IMPRESSION: 14.6 cm right  rectus sheath hematoma, previously 17.5 cm. Cholelithiasis, without associated sonographic findings to suggest acute cholecystitis. Suspected mild hepatic steatosis. Bilateral renal atrophy.  No hydronephrosis. Electronically Signed   By: Julian Hy M.D.   On: 06/20/2018 10:45   Dg Chest Port 1 View  Result Date: 06/21/2018 CLINICAL DATA:  Followup pleural effusion. EXAM: PORTABLE CHEST 1 VIEW COMPARISON:  06/21/2018 at 5:46 a.m. and older exams FINDINGS: Opacity on the right, which obscures most of the right hemidiaphragm, is unchanged consistent with a moderate pleural effusion. Associated opacity at the right lung base is consistent with atelectasis. Vascular congestion and hazy perihilar lung opacity noted on the prior study has improved. No convincing residual congestive heart failure. No left pleural effusion.  No pneumothorax. Cardiac silhouette is enlarged. Left internal jugular central venous line is stable. IMPRESSION: 1. Improved lung aeration. Findings congestive heart failure have essentially resolved. No new lung abnormalities. 2. Persistent moderate right pleural effusion with associated atelectasis. Electronically Signed   By: Lajean Manes M.D.   On: 06/21/2018 17:25   Dg Chest Port 1 View  Result Date: 06/21/2018 CLINICAL DATA:  Shortness of breath and pleural effusion EXAM: PORTABLE CHEST 1 VIEW COMPARISON:  06/20/2018 FINDINGS: Left jugular central line is again seen. Cardiac shadow is enlarged but stable. More prominent right-sided pleural effusion is noted. Central vascular congestion has increased somewhat in the interval from the prior exam. IMPRESSION: Increasing congestive failure with new moderate size right-sided pleural effusion. Electronically Signed   By: Inez Catalina M.D.   On: 06/21/2018 09:15   Dg Chest Port 1 View  Result Date: 06/20/2018 CLINICAL DATA:  Central line placement EXAM: PORTABLE CHEST 1 VIEW COMPARISON:  06/19/2018 FINDINGS: Mild bilateral  interstitial thickening. No pleural effusion or pneumothorax. No focal consolidation. Stable cardiomegaly. Left jugular central venous catheter with the tip projecting over the cavoatrial junction. No acute osseous abnormality. IMPRESSION: 1. Left jugular central venous catheter with the tip projecting over the cavoatrial junction. 2. Cardiomegaly with mild pulmonary vascular congestion. Electronically Signed   By: Kathreen Devoid   On: 06/20/2018 10:03    STUDIES:  CT chest 6/25 > RLL PE, large right pleural effusion with atelectasis. CXR 6/25 > cardiomegaly, small right effusion, mild atx. Renal US 6/25 >  [place] Abd 6/26 > 14.6 cm right rectus sheath hematoma, previously 17.5 cm. Cholelithiasis, without associated sonographic findings to suggest acute cholecystitis. Suspected mild hepatic steatosis. Bilateral renal atrophy.  No hydronephrosis. Bilateral LE venous Doppler US 6/26>> negative for DVT  SIGNIFICANT EVENTS  6/24 Admitted to [facility]. 6/25 Transferred to [facility].  ANTIBIOTICS Cefepime 6/26 >>   CULTURES BCx2 6/26 >>  UA 6/26 >> negative Sputum 6/26 >>   ASSESSMENT / PLAN:  Acute Hypoxic Respiratory failure in setting of Acute on Chronic CHF and volume overload P: Supplemental O2 prn to maintain O2 sats >92% F/u CXR in am 6/29 IV diuresis per HF service  RLL PE - on CT chest from [facility] 6/24. P: Continue Heparin gtt, rate adjustment per pharmacy   Right pleural effusion - presumed transudative in setting CHF. P: Continue IV Diuresis per  Heart Failure service Thoracentesis attempt 6/27 aborted due to very little fluid present on [place] F/u CXR in am 6/29   Anion Gap Metabolic Acidosis - presumed due to renal failure. ? Cardiogenic shock Hyponatremia. Hypokalemia Acute on Chronic Kidney Disease P: Nephrology following, appreciate input Trend BMP Monitor I&O's Ensure adequate renal perfusion Avoid Nephrotoxic agents as able Replace electrolytes as  indicated IV diuresis per HF service & Nephrology Will give 40 mEq K x2 dose (with aggressive diuresis), discussed with Nephrology Check serum Magnesium    Shock - suspected cardiogenic but rule out infectious component -PCT 3.09>>trending down 6/28 Acute on Chronic systolic CHF (Echo from [facility] 6/24 with EF 20% and biventricular failure). A-fib w/ RVR Troponin bump - suspect demand. Hx A.fib / flutter (not on anticoagulation due to pt preference given hx rectal sheath hematoma) NICM. P: ICU Monitoring CVP q4h Continue Milrinone & Lasix per HF service Levophed prn to maintain MAP >65 Monitor fever curve Trend WBC's & PCT Follow cultures as above Continue Cefepime Continue Amiodarone gtt, plan for Cardioversion today 6/28  per Cardiology  Abdominal mass - pt attributes to rectal sheath hematoma (though this was 6 years ago). -F/u Abdominal US 6/26>>14.6 cm right rectus sheath hematoma, previously 17.5 cm. P: Monitor for s/sx of bleeding Follow CBC's   Updated pt at bedside 6/29.  Remains full code.  Darel Hong, AGACNP-BC [facility] Pgr: [phone] or if no answer [phone] 06/22/2018, 8:20 AM  Attending Note:  72 year old male with an extensive PMH who presents to PCCM with cardiogenic shock, acute pulmonary edema, acute on chronic renal failure, respiratory failure and a pleural effusion.  On exam, he is alert and interactive this AM but is in a-fib with RVR that did not respond to treatment overnight.  I reviewed CXR myself, pulmonary edema noted.  Patient responded to lasix and put out 9.7 liters overnight and kidney function is improving.  Discussed with PCCM-NP and cards-MD.  Will continue lasix for now.  Replace potassium, specially with continuation of lasix, via PO given renal function.  Titrate O2 down as able.  Will be taken to endo for elective cardioversion today, hopefully with sedation will not develop respiratory failure.  PCCM  will continue to follow.  Cardiology assuming primary service.  Pleural effusion was evaluated by U/S yesterday and is not large enough to safely thora.  Will continue diureses to address that.  The patient is critically ill with multiple organ systems failure and requires high complexity decision making for assessment and support, frequent evaluation and titration of therapies, application of advanced monitoring technologies and extensive interpretation of multiple databases.   Critical Care Time devoted to patient care services described in this note is  32  Minutes. This time reflects time of care of this signee Dr Jennet Maduro. This critical care time does not reflect procedure time, or teaching time or supervisory time of PA/NP/Med student/Med Resident etc but could involve care discussion time.  Rush Farmer, M.D. [facility] Pulmonary/Critical Care Medicine. Pager: [phone]. After hours pager: [phone].

## 2018-06-22 NOTE — Anesthesia Procedure Notes (Signed)
Procedure Name: General with mask airway Date/Time: 06/22/2018 1:31 PM Performed by: Elliot Dally, CRNA Pre-anesthesia Checklist: Patient identified, Emergency Drugs available, Suction available and Patient being monitored Patient Re-evaluated:Patient Re-evaluated prior to induction Oxygen Delivery Method: Ambu bag Preoxygenation: Pre-oxygenation with 100% oxygen Induction Type: IV induction

## 2018-06-22 NOTE — H&P (View-Only) (Signed)
Advanced Heart Failure Rounding Note  PCP-Cardiologist: No primary care provider on file.   Subjective:      Off NE. Remains on milrinone 0.25. CVP 6. Weight down.25 pounds on l;asix 15/hr. Co-ox 69%  Remains in rapid with HRs in 130-150s despite several amio boluses and drip at 60/h  CXR clearing but still with small to moderate R effusion. (Personally reviewed). CCM attempted R thoracentesis but minimal fluid on u/s so aborted.   Breathing better. Denies SOB, palpitations, orthopnea or PND. Creatinine 4.6-> 4.1  K 3.0   Objective:   Weight Range: 81.1 kg (178 lb 14.4 oz) Body mass index is 28.88 kg/m.   Vital Signs:   Temp:  [97.6 F (36.4 C)-98.6 F (37 C)] 97.6 F (36.4 C) (06/28 0700) Pulse Rate:  [41-160] 54 (06/28 0700) Resp:  [13-25] 21 (06/28 0800) BP: (88-127)/(63-97) 88/63 (06/28 0800) SpO2:  [91 %-100 %] 99 % (06/28 0800) Weight:  [81.1 kg (178 lb 14.4 oz)] 81.1 kg (178 lb 14.4 oz) (06/28 0600) Last BM Date: 06/21/18  Weight change: Filed Weights   06/20/18 0300 06/21/18 0600 06/22/18 0600  Weight: 92.2 kg (203 lb 4.2 oz) 89.2 kg (196 lb 10.4 oz) 81.1 kg (178 lb 14.4 oz)    Intake/Output:   Intake/Output Summary (Last 24 hours) at 06/22/2018 0958 Last data filed at 06/22/2018 0843 Gross per 24 hour  Intake 1901.41 ml  Output 8775 ml  Net -6873.59 ml      Physical Exam    General:  Lying flat in bed. No resp difficulty HEENT: normal Neck: supple. LIJ TLC Carotids 2+ bilat; no bruits. No lymphadenopathy or thryomegaly appreciated. Cor: PMI laterally displaced. Tachy irreg Lungs: clear Abdomen: soft, nontender, nondistended. + rectal sheath hematoma No hepatosplenomegaly. No bruits or masses. Good bowel sounds. Extremities: no cyanosis, clubbing, rash, 1+ edema Neuro: alert & orientedx3, cranial nerves grossly intact. moves all 4 extremities w/o difficulty. Affect pleasant   Telemetry   AF 140s Personally reviewed   Labs     CBC Recent Labs    06/21/18 1654 06/22/18 0330  WBC 12.2* 11.7*  HGB 13.5 15.7  HCT 40.8 46.8  MCV 89.5 89.1  PLT 100* 115*   Basic Metabolic Panel Recent Labs    09/98/33 2328 06/21/18 0347 06/22/18 0330  NA  --  133* 133*  K  --  3.6 3.0*  CL  --  87* 73*  CO2  --  30 42*  GLUCOSE  --  129* 152*  BUN  --  88* 83*  CREATININE  --  4.59* 4.07*  CALCIUM  --  7.2* 7.9*  MG 2.2 2.2  --   PHOS  --  6.9*  --    Liver Function Tests No results for input(s): AST, ALT, ALKPHOS, BILITOT, PROT, ALBUMIN in the last 72 hours. No results for input(s): LIPASE, AMYLASE in the last 72 hours. Cardiac Enzymes Recent Labs    06/20/18 0420 06/20/18 0815 06/20/18 1806  TROPONINI 0.27* 0.32* 0.28*    BNP: BNP (last 3 results) Recent Labs    06/19/18 2128  BNP 1,948.0*    ProBNP (last 3 results) No results for input(s): PROBNP in the last 8760 hours.   D-Dimer No results for input(s): DDIMER in the last 72 hours. Hemoglobin A1C Recent Labs    06/20/18 0113  HGBA1C 6.1*   Fasting Lipid Panel Recent Labs    06/20/18 0113  CHOL 178  HDL 30*  LDLCALC 121*  TRIG  136  CHOLHDL 5.9   Thyroid Function Tests No results for input(s): TSH, T4TOTAL, T3FREE, THYROIDAB in the last 72 hours.  Invalid input(s): FREET3  Other results:   Imaging    Dg Chest Port 1 View  Result Date: 06/21/2018 CLINICAL DATA:  Followup pleural effusion. EXAM: PORTABLE CHEST 1 VIEW COMPARISON:  06/21/2018 at 5:46 a.m. and older exams FINDINGS: Opacity on the right, which obscures most of the right hemidiaphragm, is unchanged consistent with a moderate pleural effusion. Associated opacity at the right lung base is consistent with atelectasis. Vascular congestion and hazy perihilar lung opacity noted on the prior study has improved. No convincing residual congestive heart failure. No left pleural effusion.  No pneumothorax. Cardiac silhouette is enlarged. Left internal jugular central venous  line is stable. IMPRESSION: 1. Improved lung aeration. Findings congestive heart failure have essentially resolved. No new lung abnormalities. 2. Persistent moderate right pleural effusion with associated atelectasis. Electronically Signed   By: Amie Portland M.D.   On: 06/21/2018 17:25     Medications:     Scheduled Medications: . sodium chloride   Intravenous Once  . amiodarone  150 mg Intravenous Once  . multivitamin with minerals  1 tablet Oral Daily  . patiromer  16.8 g Oral Once  . potassium chloride  40 mEq Oral Once  . potassium chloride  40 mEq Oral Once  . sodium chloride flush  3 mL Intravenous Q12H  . sodium chloride flush  3 mL Intravenous Q12H  . sodium polystyrene  30 g Rectal Once    Infusions: . sodium chloride Stopped (06/20/18 0700)  . sodium chloride    . amiodarone 60 mg/hr (06/22/18 0700)  . ceFEPime (MAXIPIME) IV 200 mL/hr at 06/21/18 0929  . furosemide (LASIX) infusion 15 mg/hr (06/22/18 0700)  . heparin 900 Units/hr (06/22/18 0700)  . milrinone 0.25 mcg/kg/min (06/22/18 0700)  . norepinephrine (LEVOPHED) Adult infusion 0 mcg/min (06/21/18 0115)    PRN Medications: sodium chloride, levalbuterol, ondansetron (ZOFRAN) IV, sodium chloride flush, sodium chloride flush    Patient Profile    Mr. Aday is a 72 yo with PAF/FL, systolic HF due to NICM, HTN,previous rectus sheath hematoma who was transferred from [facility] due to cardiogenic shock in setting of recurrent AF.    Assessment/Plan   1. Cardiogenic shock, recurrent - EF 20-25% by Echo at [facility] 6/19 - Likely NICM due to tachy-induced CM which is recurrent for him  - Central access placed with initial co-ox 40% now up to 68% on milrinone 0.25. NE off. - Volume status much improved with CVP down to 6 but still with peripheral edema. Continue lasix gtt for one more day - Now that he is somewhat improved will plan DC-CV to restore NSR as he can never tolerate AFL/A tach. This will  not be elective as he will not get out of shock without it so will not need TEE DC-CV scheduled for 1p. - Continue heparin. Will switch back to Eliquis over the weekend. Discussed dosing with PharmD personally. - No ACE/ARB with AKI. No bblocker  2. AKI - Due to #1 - Improving with hemodynamic support. Creatinine 4.6-> 4.1  - Hyperkalemia resolved  3. Acute respiratory failure - Improved.   4. Paroxysmal AFL/atiral tach - Recurrent. Remains quite fast despite IV amio .Management as above. DC-CV scheduled for today at 1p - Continue amio at 60/hr. Rebolus now - Discussed need for AVN ablation and biv pacing to prevent recurrent near-death epsidoe like these but both he  and his wife refuse   5. Moderate right pleural effusion with elevated PCT - no frank symptoms of infection but CT appearance concerning and PCT is up - On cefipime. Cx remains negative.  - CCM attempted thoracentesis 6/26  but no fluid left on u/s - CXR with improving effusion. Personally reviewed   6. Rectus sheath hematoma - chronic. No change  7. Pulmonary embolus - I have reviewed personally doubt this is present Will need AC for AF anyway    CRITICAL CARE Performed by: [name]  Total critical care time: 35 minutes  Critical care time was exclusive of separately billable procedures and treating other patients.  Critical care was necessary to treat or prevent imminent or life-threatening deterioration.  Critical care was time spent personally by me (independent of midlevel providers or residents) on the following activities: development of treatment plan with patient and/or surrogate as well as nursing, discussions with consultants, evaluation of patient's response to treatment, examination of patient, obtaining history from patient or surrogate, ordering and performing treatments and interventions, ordering and review of laboratory studies, ordering and review of radiographic studies, pulse  oximetry and re-evaluation of patient's condition.    Length of Stay: 3  [name], MD  06/22/2018, 9:58 AM  Advanced Heart Failure Team Pager [phone] (M-F; 7a - 4p)  Please contact [facility] for night-coverage after hours (4p -7a ) and weekends on [email or web address]

## 2018-06-22 NOTE — Progress Notes (Signed)
[name] NEPHROLOGY PROGRESS NOTE  Assessment/ Plan: Pt is a 72 y.o. yo male  with history of hypertension, atrial fibrillation, previous rectus sheath hematoma, nonischemic cardiomyopathy, systolic CHF with EF of 20 to 16%, CKD stage 3,  transferred from [facility] due to cardiogenic shock in the setting of recurrent A. fib.  Assessment/Plan:  #Acute on chronic kidney disease III likely cardiorenal syndrome due to decompensated heart failure: Patient was hypotensive to 50s.  The ultrasound of abdomen showed bilateral atrophy kidney with no obstruction.  UA which was sent after foley has RBC likely trauma related. -Patient is responding well with IV Lasix drip, urine output of more than 9.5 L in 24 hours.  Currently on Lasix 15 mg/h.  Still volume overload.  Heart failure team like to continue drip at least 1 more day.  Monitor electrolytes.  Serum creatinine level 4.0 today.    #Hyperkalemia in the setting of renal failure on admission: Improved.  Now patient is hypokalemic due to diuretics.  Replating potassium chloride.  Magnesium level acceptable.  #Severe lactic acidosis in the setting of poor perfusion: Blood pressure is better now.  #Compensated heart failure: Currently on milrinone per HF team.  Echo with EF of 20 to 25%.  #Cardiogenic shock: On Levophed.  Blood pressure improved.    Subjective: Seen and examined at bedside.  Shortness of breath is better.  Denies headache, dizziness, nausea vomiting.  No chest pain. Objective Vital signs in last 24 hours: Vitals:   06/22/18 1000 06/22/18 1009 06/22/18 1012 06/22/18 1015  BP: (!) 112/91 90/75 (!) 86/72 93/80  Pulse:      Resp: (!) 22 (!) 29 20 (!) 26  Temp:      TempSrc:      SpO2: 97% 97% 95% 97%  Weight:      Height:       Weight change: -8.052 kg (-17 lb 12 oz)  Intake/Output Summary (Last 24 hours) at 06/22/2018 1025 Last data filed at 06/22/2018 0843 Gross per 24 hour  Intake 1796.62 ml   Output 8375 ml  Net -6578.38 ml       Labs: Basic Metabolic Panel: Recent Labs  Lab 06/20/18 1806 06/21/18 0347 06/22/18 0330  NA 134* 133* 133*  K 4.4 3.6 3.0*  CL 93* 87* 73*  CO2 22 30 42*  GLUCOSE 167* 129* 152*  BUN 82* 88* 83*  CREATININE 4.63* 4.59* 4.07*  CALCIUM 7.6* 7.2* 7.9*  PHOS  --  6.9*  --    Liver Function Tests: No results for input(s): AST, ALT, ALKPHOS, BILITOT, PROT, ALBUMIN in the last 168 hours. No results for input(s): LIPASE, AMYLASE in the last 168 hours. No results for input(s): AMMONIA in the last 168 hours. CBC: Recent Labs  Lab 06/20/18 0113 06/21/18 1654 06/22/18 0330  WBC 17.8* 12.2* 11.7*  HGB 14.7 13.5 15.7  HCT 47.3 40.8 46.8  MCV 96.3 89.5 89.1  PLT 120* 100* 115*   Cardiac Enzymes: Recent Labs  Lab 06/19/18 2128 06/20/18 0420 06/20/18 0815 06/20/18 1806  TROPONINI 0.27* 0.27* 0.32* 0.28*   CBG: No results for input(s): GLUCAP in the last 168 hours.  Iron Studies: No results for input(s): IRON, TIBC, TRANSFERRIN, FERRITIN in the last 72 hours. Studies/Results: Dg Chest Port 1 View  Result Date: 06/21/2018 CLINICAL DATA:  Followup pleural effusion. EXAM: PORTABLE CHEST 1 VIEW COMPARISON:  06/21/2018 at 5:46 a.m. and older exams FINDINGS: Opacity on the right, which obscures most of the right hemidiaphragm,  is unchanged consistent with a moderate pleural effusion. Associated opacity at the right lung base is consistent with atelectasis. Vascular congestion and hazy perihilar lung opacity noted on the prior study has improved. No convincing residual congestive heart failure. No left pleural effusion.  No pneumothorax. Cardiac silhouette is enlarged. Left internal jugular central venous line is stable. IMPRESSION: 1. Improved lung aeration. Findings congestive heart failure have essentially resolved. No new lung abnormalities. 2. Persistent moderate right pleural effusion with associated atelectasis. Electronically Signed   By:  Amie Portland M.D.   On: 06/21/2018 17:25   Dg Chest Port 1 View  Result Date: 06/21/2018 CLINICAL DATA:  Shortness of breath and pleural effusion EXAM: PORTABLE CHEST 1 VIEW COMPARISON:  06/20/2018 FINDINGS: Left jugular central line is again seen. Cardiac shadow is enlarged but stable. More prominent right-sided pleural effusion is noted. Central vascular congestion has increased somewhat in the interval from the prior exam. IMPRESSION: Increasing congestive failure with new moderate size right-sided pleural effusion. Electronically Signed   By: Alcide Clever M.D.   On: 06/21/2018 09:15    Medications: Infusions: . sodium chloride Stopped (06/20/18 0700)  . sodium chloride    . amiodarone 60 mg/hr (06/22/18 1007)  . ceFEPime (MAXIPIME) IV 200 mL/hr at 06/21/18 0929  . furosemide (LASIX) infusion 15 mg/hr (06/22/18 1007)  . heparin 900 Units/hr (06/22/18 0700)  . milrinone 0.25 mcg/kg/min (06/22/18 0700)  . norepinephrine (LEVOPHED) Adult infusion 0 mcg/min (06/21/18 0115)    Scheduled Medications: . sodium chloride   Intravenous Once  . multivitamin with minerals  1 tablet Oral Daily  . potassium chloride  40 mEq Oral Once  . potassium chloride  40 mEq Oral Once  . sodium chloride flush  3 mL Intravenous Q12H  . sodium chloride flush  3 mL Intravenous Q12H  . sodium polystyrene  30 g Rectal Once    have reviewed scheduled and prn medications.  Physical Exam: General: Ill-looking male lying on bed, not in distress Heart: Tachycardia, S1-S2 normal Lungs: Bibasilar decreased breath sound, no crackle Abdomen:soft, Non-tender, non-distended Extremities: Lower extremities pitting edema +, improving Neuro: Alert awake and following commands  Dron Prasad Bhandari 06/22/2018,10:25 AM  LOS: 3 days

## 2018-06-22 NOTE — CV Procedure (Signed)
     DIRECT CURRENT CARDIOVERSION  NAMEOllis [name]   MRN: [id number] DOB:  31-Dec-1945   ADMIT DATE: 06/19/2018   INDICATIONS: Atrial fibrillation    PROCEDURE:   Informed consent was obtained prior to the procedure. The risks, benefits and alternatives for the procedure were discussed and the patient comprehended these risks. Once an appropriate time out was taken, the patient had the defibrillator pads placed in the anterior and posterior position. The patient then underwent sedation by the anesthesia service. Once an appropriate level of sedation was achieved, the patient received a single biphasic, synchronized 200J shock with prompt conversion to sinus rhythm. No apparent complications.   [name], MD  1:23 PM

## 2018-06-22 NOTE — Anesthesia Preprocedure Evaluation (Addendum)
Anesthesia Evaluation  Patient identified by MRN, date of birth, ID band Patient awake    Reviewed: Allergy & Precautions, NPO status , Patient's Chart, lab work & pertinent test results  Airway Mallampati: II  TM Distance: >3 FB Neck ROM: Full    Dental no notable dental hx. (+) Loose, Poor Dentition, Dental Advisory Given,    Pulmonary sleep apnea ,    Pulmonary exam normal breath sounds clear to auscultation       Cardiovascular hypertension, + Past MI and +CHF  negative cardio ROS Normal cardiovascular exam Rhythm:Regular Rate:Normal     Neuro/Psych negative neurological ROS  negative psych ROS   GI/Hepatic negative GI ROS, Neg liver ROS,   Endo/Other  negative endocrine ROS  Renal/GU      Musculoskeletal negative musculoskeletal ROS (+)   Abdominal   Peds negative pediatric ROS (+)  Hematology  (+) anemia ,   Anesthesia Other Findings   Reproductive/Obstetrics negative OB ROS                             Anesthesia Physical Anesthesia Plan  ASA: IV  Anesthesia Plan: General   Post-op Pain Management:    Induction: Intravenous  PONV Risk Score and Plan:   Airway Management Planned: Mask and Simple Face Mask  Additional Equipment:   Intra-op Plan:   Post-operative Plan:   Informed Consent: I have reviewed the patients History and Physical, chart, labs and discussed the procedure including the risks, benefits and alternatives for the proposed anesthesia with the patient or authorized representative who has indicated his/her understanding and acceptance.   Dental advisory given  Plan Discussed with: CRNA  Anesthesia Plan Comments:        Anesthesia Quick Evaluation

## 2018-06-22 NOTE — Anesthesia Postprocedure Evaluation (Signed)
Anesthesia Post Note  Patient: Qualyn No  Procedure(s) Performed: CARDIOVERSION (N/A )     Patient location during evaluation: Endoscopy Anesthesia Type: General Level of consciousness: awake and alert Pain management: pain level controlled Vital Signs Assessment: post-procedure vital signs reviewed and stable Respiratory status: spontaneous breathing, nonlabored ventilation, respiratory function stable and patient connected to nasal cannula oxygen Cardiovascular status: blood pressure returned to baseline and stable Postop Assessment: no apparent nausea or vomiting Anesthetic complications: no    Last Vitals:  Vitals:   06/22/18 1700 06/22/18 1800  BP: 118/83 92/77  Pulse: 84 88  Resp: 18 17  Temp:    SpO2: 97% 96%    Last Pain:  Vitals:   06/22/18 1600  TempSrc:   PainSc: 0-No pain   Pain Goal:                 Trevor Iha

## 2018-06-22 NOTE — Progress Notes (Addendum)
Advanced Heart Failure Rounding Note  PCP-Cardiologist: No primary care provider on file.   Subjective:    Underwent DC-CV yesterday. Remains in NSR on amio 60.   On lasix gtt. CVP down to 4-5. SBP now 70-80s. Co-ox 70%. Creatinine down to 3.5  Feels weak but denies SOB, orthopnea or PND.   Has not walked yet.   Objective:   Weight Range: 81.1 kg (178 lb 14.4 oz) Body mass index is 28.88 kg/m.   Vital Signs:   Temp:  [97.6 F (36.4 C)-98.6 F (37 C)] 97.6 F (36.4 C) (06/28 0700) Pulse Rate:  [41-160] 54 (06/28 0700) Resp:  [13-25] 21 (06/28 0800) BP: (88-127)/(63-97) 88/63 (06/28 0800) SpO2:  [91 %-100 %] 99 % (06/28 0800) Weight:  [81.1 kg (178 lb 14.4 oz)] 81.1 kg (178 lb 14.4 oz) (06/28 0600) Last BM Date: 06/21/18  Weight change: Filed Weights   06/20/18 0300 06/21/18 0600 06/22/18 0600  Weight: 92.2 kg (203 lb 4.2 oz) 89.2 kg (196 lb 10.4 oz) 81.1 kg (178 lb 14.4 oz)    Intake/Output:   Intake/Output Summary (Last 24 hours) at 06/22/2018 0958 Last data filed at 06/22/2018 0843 Gross per 24 hour  Intake 1901.41 ml  Output 8775 ml  Net -6873.59 ml      Physical Exam    General: Lying in bed. No resp difficulty HEENT: normal Neck: supple. no JVD. LIJ TLC Carotids 2+ bilat; no bruits. No lymphadenopathy or thryomegaly appreciated. Cor: PMI laterally displaced. Regular rate & rhythm. No rubs, gallops or murmurs. Lungs: clear Abdomen: soft, nontender, nondistended. No hepatosplenomegaly. No bruits or masses. Good bowel sounds. Extremities: no cyanosis, clubbing, rash, edema + TED hose Neuro: alert & orientedx3, cranial nerves grossly intact. moves all 4 extremities w/o difficulty. Affect pleasant   Telemetry   SR 70-80s Personally reviewed   Labs    CBC Recent Labs    06/21/18 1654 06/22/18 0330  WBC 12.2* 11.7*  HGB 13.5 15.7  HCT 40.8 46.8  MCV 89.5 89.1  PLT 100* 115*   Basic Metabolic Panel Recent Labs    16/10/96 2328  06/21/18 0347 06/22/18 0330  NA  --  133* 133*  K  --  3.6 3.0*  CL  --  87* 73*  CO2  --  30 42*  GLUCOSE  --  129* 152*  BUN  --  88* 83*  CREATININE  --  4.59* 4.07*  CALCIUM  --  7.2* 7.9*  MG 2.2 2.2  --   PHOS  --  6.9*  --    Liver Function Tests No results for input(s): AST, ALT, ALKPHOS, BILITOT, PROT, ALBUMIN in the last 72 hours. No results for input(s): LIPASE, AMYLASE in the last 72 hours. Cardiac Enzymes Recent Labs    06/20/18 0420 06/20/18 0815 06/20/18 1806  TROPONINI 0.27* 0.32* 0.28*    BNP: BNP (last 3 results) Recent Labs    06/19/18 2128  BNP 1,948.0*    ProBNP (last 3 results) No results for input(s): PROBNP in the last 8760 hours.   D-Dimer No results for input(s): DDIMER in the last 72 hours. Hemoglobin A1C Recent Labs    06/20/18 0113  HGBA1C 6.1*   Fasting Lipid Panel Recent Labs    06/20/18 0113  CHOL 178  HDL 30*  LDLCALC 121*  TRIG 136  CHOLHDL 5.9   Thyroid Function Tests No results for input(s): TSH, T4TOTAL, T3FREE, THYROIDAB in the last 72 hours.  Invalid input(s): FREET3  Other results:   Imaging    Dg Chest Port 1 View  Result Date: 06/21/2018 CLINICAL DATA:  Followup pleural effusion. EXAM: PORTABLE CHEST 1 VIEW COMPARISON:  06/21/2018 at 5:46 a.m. and older exams FINDINGS: Opacity on the right, which obscures most of the right hemidiaphragm, is unchanged consistent with a moderate pleural effusion. Associated opacity at the right lung base is consistent with atelectasis. Vascular congestion and hazy perihilar lung opacity noted on the prior study has improved. No convincing residual congestive heart failure. No left pleural effusion.  No pneumothorax. Cardiac silhouette is enlarged. Left internal jugular central venous line is stable. IMPRESSION: 1. Improved lung aeration. Findings congestive heart failure have essentially resolved. No new lung abnormalities. 2. Persistent moderate right pleural effusion with  associated atelectasis. Electronically Signed   By: Amie Portland M.D.   On: 06/21/2018 17:25     Medications:     Scheduled Medications: . sodium chloride   Intravenous Once  . amiodarone  150 mg Intravenous Once  . multivitamin with minerals  1 tablet Oral Daily  . patiromer  16.8 g Oral Once  . potassium chloride  40 mEq Oral Once  . potassium chloride  40 mEq Oral Once  . sodium chloride flush  3 mL Intravenous Q12H  . sodium chloride flush  3 mL Intravenous Q12H  . sodium polystyrene  30 g Rectal Once    Infusions: . sodium chloride Stopped (06/20/18 0700)  . sodium chloride    . amiodarone 60 mg/hr (06/22/18 0700)  . ceFEPime (MAXIPIME) IV 200 mL/hr at 06/21/18 0929  . furosemide (LASIX) infusion 15 mg/hr (06/22/18 0700)  . heparin 900 Units/hr (06/22/18 0700)  . milrinone 0.25 mcg/kg/min (06/22/18 0700)  . norepinephrine (LEVOPHED) Adult infusion 0 mcg/min (06/21/18 0115)    PRN Medications: sodium chloride, levalbuterol, ondansetron (ZOFRAN) IV, sodium chloride flush, sodium chloride flush    Patient Profile    Martin [name] is a 72 yo with PAF/FL, systolic HF due to NICM, HTN,previous rectus sheath hematoma who was transferred from [facility] due to cardiogenic shock in setting of recurrent AF.    Assessment/Plan   1. Cardiogenic shock, recurrent - EF 20-25% by Echo at [facility] 6/19 - Likely NICM due to tachy-induced CM which is recurrent for him  - Central access placed with initial co-ox 40% now improved - Hemodynamics much improved with milrinone support and DC-CV. Cut milrinone 10 0.125 - Volume status much improved CVP now 4-5. Will stop IV lasix. May need a little fluid back  - DC-CV yesterday on heparin.  Will switch back to Eliquis tomorrow. Discussed dosing with PharmD personally. - No ACE/ARB with AKI. No bblocker  2. AKI - Due to #1 - Improving with hemodynamic support. Creatinine 4.6-> 4.1>3.5 - Hyperkalemia resolved  3. Acute  respiratory failure - Improved.   4. Paroxysmal AFL/atiral tach - Recurrent. Unable to rate control. Now s/p DC-CV on 6/28 - Maintaining NSR. Can drop amio to 30/hr. Change to po tomorrow - Discussed need for AVN ablation and biv pacing to prevent recurrent near-death epsidoe like these but both he and his wife refuse   5. Moderate right pleural effusion with elevated PCT - no frank symptoms of infection but CT appearance concerning and PCT is up - On cefipime. Cx remains negative.  - CCM attempted thoracentesis 6/26  but no fluid left on u/s - CXR with improving effusion. Will follow   6. Rectus sheath hematoma - chronic. No change  7. Pulmonary embolus -  I have reviewed personally doubt this is present Will need AC for AF anyway   8. Dispo - can go to SDU - CR to ambulate - remove Foley  Length of Stay: 3  Martin Meres, MD  06/22/2018, 9:58 AM  Advanced Heart Failure Team Pager [phone] (M-F; 7a - 4p)  Please contact [facility] for night-coverage after hours (4p -7a ) and weekends on [email or web address]

## 2018-06-22 NOTE — Interval H&P Note (Signed)
History and Physical Interval Note:  06/22/2018 1:22 PM  Martin [name]  has presented today for surgery, with the diagnosis of afib  The various methods of treatment have been discussed with the patient and family. After consideration of risks, benefits and other options for treatment, the patient has consented to  Procedure(s): CARDIOVERSION (N/A) as a surgical intervention .  The patient's history has been reviewed, patient examined, no change in status, stable for surgery.  I have reviewed the patient's chart and labs.  Questions were answered to the patient's satisfaction.     Daniel Bensimhon

## 2018-06-23 ENCOUNTER — Inpatient Hospital Stay (HOSPITAL_COMMUNITY): Payer: Medicare HMO

## 2018-06-23 ENCOUNTER — Other Ambulatory Visit: Payer: Self-pay

## 2018-06-23 ENCOUNTER — Encounter (HOSPITAL_COMMUNITY): Payer: Self-pay | Admitting: Internal Medicine

## 2018-06-23 LAB — BASIC METABOLIC PANEL
Anion gap: 17 — ABNORMAL HIGH (ref 5–15)
BUN: 76 mg/dL — AB (ref 8–23)
CHLORIDE: 72 mmol/L — AB (ref 98–111)
CO2: 42 mmol/L — ABNORMAL HIGH (ref 22–32)
Calcium: 8.8 mg/dL — ABNORMAL LOW (ref 8.9–10.3)
Creatinine, Ser: 3.49 mg/dL — ABNORMAL HIGH (ref 0.61–1.24)
GFR calc Af Amer: 19 mL/min — ABNORMAL LOW (ref >60)
GFR calc non Af Amer: 16 mL/min — ABNORMAL LOW (ref >60)
GLUCOSE: 152 mg/dL — AB (ref 70–99)
Potassium: 3.9 mmol/L (ref 3.5–5.1)
Sodium: 131 mmol/L — ABNORMAL LOW (ref 135–145)

## 2018-06-23 LAB — COOXEMETRY PANEL
Carboxyhemoglobin: 1.7 % — ABNORMAL HIGH (ref 0.5–1.5)
Methemoglobin: 1 % (ref 0.0–1.5)
O2 SAT: 70.3 %
TOTAL HEMOGLOBIN: 16.6 g/dL — AB (ref 12.0–16.0)

## 2018-06-23 LAB — CBC
HCT: 49.7 % (ref 39.0–52.0)
Hemoglobin: 16.2 g/dL (ref 13.0–17.0)
MCH: 29.5 pg (ref 26.0–34.0)
MCHC: 32.6 g/dL (ref 30.0–36.0)
MCV: 90.4 fL (ref 78.0–100.0)
PLATELETS: 114 10*3/uL — AB (ref 150–400)
RBC: 5.5 MIL/uL (ref 4.22–5.81)
RDW: 15.7 % — AB (ref 11.5–15.5)
WBC: 10.8 10*3/uL — ABNORMAL HIGH (ref 4.0–10.5)

## 2018-06-23 LAB — PROCALCITONIN: Procalcitonin: 1.95 ng/mL

## 2018-06-23 LAB — HEPARIN LEVEL (UNFRACTIONATED): Heparin Unfractionated: 0.98 IU/mL — ABNORMAL HIGH (ref 0.30–0.70)

## 2018-06-23 LAB — PROTIME-INR
INR: 1.17
PROTHROMBIN TIME: 14.8 s (ref 11.4–15.2)

## 2018-06-23 LAB — APTT: aPTT: 96 seconds — ABNORMAL HIGH (ref 24–36)

## 2018-06-23 MED ORDER — AMIODARONE LOAD VIA INFUSION
150.0000 mg | Freq: Once | INTRAVENOUS | Status: AC
  Administered 2018-06-23: 150 mg via INTRAVENOUS
  Filled 2018-06-23: qty 83.34

## 2018-06-23 NOTE — Progress Notes (Signed)
ANTICOAGULATION CONSULT NOTE  Pharmacy Consult for Apixaban>Heparin  Indication: pulmonary embolus, afib  Allergies  Allergen Reactions  . Amiodarone Rash    Rash resolved when amio stopped 2013, retried in 2017 with same reaction    Patient Measurements: Ht: 5'6" Wt: 92.2 kg Heparin Dose Weight: 83.5 kg  Vital Signs: Temp: 97.5 F (36.4 C) (06/29 0739) Temp Source: Oral (06/29 0739) BP: 119/68 (06/29 0700) Pulse Rate: 76 (06/29 0700)  Labs: Recent Labs    06/20/18 0815 06/20/18 1806  06/21/18 0347 06/21/18 0835  06/21/18 1654 06/21/18 2315 06/22/18 0330 06/22/18 1025 06/23/18 0315  HGB  --   --   --   --   --    < > 13.5  --  15.7  --  16.2  HCT  --   --   --   --   --   --  40.8  --  46.8  --  49.7  PLT  --   --   --   --   --   --  100*  --  115*  --  114*  APTT 83*  --    < >  --  94*  --   --  127*  --  102* 96*  LABPROT 43.4*  --   --  28.1*  --   --   --   --  16.9*  --  14.8  INR 4.63*  --   --  2.66  --   --   --   --  1.38  --  1.17  HEPARINUNFRC >2.20*  --    < >  --  >2.20*  --   --   --   --  1.98* 0.98*  CREATININE 4.30* 4.63*  --  4.59*  --   --   --   --  4.07*  --  3.49*  TROPONINI 0.32* 0.28*  --   --   --   --   --   --   --   --   --    < > = values in this interval not displayed.    Estimated Creatinine Clearance: 20.5 mL/min (A) (by C-G formula based on SCr of 3.49 mg/dL (H)).    Assessment: 72 y/o transfer from outside hospital with afib and PE, pt started on apixaban for PE in RLL without heart strain on CTA at [facility] (last dose on 6/25 in AM). Transitioned to heparin.  Determined not to be PE but will anticoagulate for Afib Now in SR on Amiodarone 60mg /hr, s/p DCCV 06/22/18  APTT 96 sec at goal this AM on heparin drip rate 900units/hr.  Heparin level remains falsely elevated from apixaban.  No overt bleeding or complications noted.  Goal of Therapy: Heparin level 0.3-0.7 units/mL aPTT 66-102 secs  Monitor platelets by  anticoagulation protocol: Yes   Plan:  Continue IV heparin at current rate 900 units/hr. Daily heparin level, CBC, and PTT. F/u plans to resume oral anticoagulation a day or two after DCCV.  Leota Sauers Pharm.D. CPP, BCPS Clinical Pharmacist [phone] 06/23/2018 7:45 AM

## 2018-06-23 NOTE — Progress Notes (Signed)
[facility]PHROLOG[facility] PROGRESS NOTE  Assessment/ Plan: Pt is a 72 y.o. yo male  with history of hypertension, atrial fibrillation, previous rectus sheath hematoma, nonischemic cardiomyopathy, systolic CHF with EF of 20 to 16%, CKD stage 3,  transferred from [facility] due to cardiogenic shock in the setting of recurrent A. fib.  Assessment/Plan:  #Acute on chronic kidney disease III likely cardiorenal syndrome due to decompensated heart failure: Patient was hypotensive to 50s on admission.  The ultrasound of abdomen showed bilateral atrophy kidney with no obstruction.  UA which was sent after foley has RBC likely trauma related. -Patient is responding well with IV Lasix drips with significant urine output.  Urine output of more than 5 L in 24 hours.  Serum creatinine level trending down to 3.49.  Diuretics is managed by cardiologist.  Discussed with Dr. Gala Romney.  I will sign off at this time.  I recommend patient to follow-up with nephrologist after discharge from the hospital.  I have discussed with the patient and his wife.  #Hyperkalemia in the setting of renal failure on admission: Improved.    #Severe lactic acidosis in the setting of poor perfusion: Blood pressure is better now.  #Decompensated heart failure: Currently on milrinone per HF team.  Echo with EF of 20 to 25%.  #Cardiogenic shock: On Levophed.  Blood pressure improved.   Call back with any question.  Subjective: Seen and examined at bedside.  Feeling better.  Denies chest pain, shortness of breath, nausea vomiting.  Has weakness.  His wife at bedside Objective Vital signs in last 24 hours: Vitals:   06/23/18 0700 06/23/18 0739 06/23/18 0800 06/23/18 0830  BP: 119/68  93/65   Pulse: 76  88 89  Resp: 15  17 20   Temp:  (!) 97.5 F (36.4 C)    TempSrc:  Oral    SpO2: 99%  96% 96%  Weight:      Height:       Weight change: 4.451 kg (9 lb 13 oz)  Intake/Output Summary (Last 24 hours) at  06/23/2018 0935 Last data filed at 06/23/2018 0830 Gross per 24 hour  Intake 3185.74 ml  Output 4775 ml  Net -1589.26 ml       Labs: Basic Metabolic Panel: Recent Labs  Lab 06/21/18 0347 06/22/18 0330 06/23/18 0315  NA 133* 133* 131*  K 3.6 3.0* 3.9  CL 87* 73* 72*  CO2 30 42* 42*  GLUCOSE 129* 152* 152*  BUN 88* 83* 76*  CREATININE 4.59* 4.07* 3.49*  CALCIUM 7.2* 7.9* 8.8*  PHOS 6.9*  --   --    Liver Function Tests: No results for input(s): AST, ALT, ALKPHOS, BILITOT, PROT, ALBUMIN in the last 168 hours. No results for input(s): LIPASE, AMYLASE in the last 168 hours. No results for input(s): AMMONIA in the last 168 hours. CBC: Recent Labs  Lab 06/20/18 0113 06/21/18 1654 06/22/18 0330 06/23/18 0315  WBC 17.8* 12.2* 11.7* 10.8*  HGB 14.7 13.5 15.7 16.2  HCT 47.3 40.8 46.8 49.7  MCV 96.3 89.5 89.1 90.4  PLT 120* 100* 115* 114*   Cardiac Enzymes: Recent Labs  Lab 06/19/18 2128 06/20/18 0420 06/20/18 0815 06/20/18 1806  TROPONINI 0.27* 0.27* 0.32* 0.28*   CBG: No results for input(s): GLUCAP in the last 168 hours.  Iron Studies: No results for input(s): IRON, TIBC, TRANSFERRIN, FERRITIN in the last 72 hours. Studies/Results: Dg Chest Port 1 View  Result Date: 06/23/2018 CLINICAL DATA:  Respiratory failure EXAM: PORTABLE CHEST  1 VIEW COMPARISON:  Chest radiograph 06/21/2018 FINDINGS: Slight interval retraction left IJ central venous catheter with tip projecting over the superior vena cava. Monitoring leads overlie the patient. Stable cardiomegaly. Small right pleural effusion with heterogeneous opacities right lung base. No pneumothorax. IMPRESSION: Small right pleural effusion with underlying opacities. Electronically Signed   By: Annia Belt M.D.   On: 06/23/2018 07:55   Dg Chest Port 1 View  Result Date: 06/21/2018 CLINICAL DATA:  Followup pleural effusion. EXAM: PORTABLE CHEST 1 VIEW COMPARISON:  06/21/2018 at 5:46 a.m. and older exams FINDINGS: Opacity  on the right, which obscures most of the right hemidiaphragm, is unchanged consistent with a moderate pleural effusion. Associated opacity at the right lung base is consistent with atelectasis. Vascular congestion and hazy perihilar lung opacity noted on the prior study has improved. No convincing residual congestive heart failure. No left pleural effusion.  No pneumothorax. Cardiac silhouette is enlarged. Left internal jugular central venous line is stable. IMPRESSION: 1. Improved lung aeration. Findings congestive heart failure have essentially resolved. No new lung abnormalities. 2. Persistent moderate right pleural effusion with associated atelectasis. Electronically Signed   By: Amie Portland M.D.   On: 06/21/2018 17:25    Medications: Infusions: . sodium chloride    . amiodarone 30 mg/hr (06/23/18 0829)  . ceFEPime (MAXIPIME) IV Stopped (06/22/18 1106)  . heparin 900 Units/hr (06/23/18 0700)  . milrinone 0.125 mcg/kg/min (06/23/18 0829)  . norepinephrine (LEVOPHED) Adult infusion 0 mcg/min (06/21/18 0115)    Scheduled Medications: . sodium chloride   Intravenous Once  . Chlorhexidine Gluconate Cloth  6 each Topical Daily  . multivitamin with minerals  1 tablet Oral Daily  . sodium chloride flush  10-40 mL Intracatheter Q12H  . sodium chloride flush  3 mL Intravenous Q12H    have reviewed scheduled and prn medications.  Physical Exam: General: Distress Heart: Tachycardia, S1-S2 normal, no rubs Lungs: Decreased breath sound, no crackles Abdomen:soft, Non-tender, non-distended Extremities: Lower extremities pitting edema +, improving Neuro: Alert awake and following commands  Martin [name] Martin [name] 06/23/2018,9:35 AM  LOS: 4 days

## 2018-06-24 LAB — PROCALCITONIN: PROCALCITONIN: 1.49 ng/mL

## 2018-06-24 LAB — COOXEMETRY PANEL
Carboxyhemoglobin: 1.5 % (ref 0.5–1.5)
METHEMOGLOBIN: 1.5 % (ref 0.0–1.5)
O2 SAT: 68.7 %
TOTAL HEMOGLOBIN: 16 g/dL (ref 12.0–16.0)

## 2018-06-24 LAB — BASIC METABOLIC PANEL
ANION GAP: 15 (ref 5–15)
BUN: 72 mg/dL — ABNORMAL HIGH (ref 8–23)
CALCIUM: 8.7 mg/dL — AB (ref 8.9–10.3)
CO2: 40 mmol/L — ABNORMAL HIGH (ref 22–32)
Chloride: 74 mmol/L — ABNORMAL LOW (ref 98–111)
Creatinine, Ser: 3.29 mg/dL — ABNORMAL HIGH (ref 0.61–1.24)
GFR calc non Af Amer: 18 mL/min — ABNORMAL LOW (ref >60)
GFR, EST AFRICAN AMERICAN: 21 mL/min — AB (ref >60)
Glucose, Bld: 135 mg/dL — ABNORMAL HIGH (ref 70–99)
POTASSIUM: 3.4 mmol/L — AB (ref 3.5–5.1)
SODIUM: 129 mmol/L — AB (ref 135–145)

## 2018-06-24 LAB — CBC
HEMATOCRIT: 47.4 % (ref 39.0–52.0)
Hemoglobin: 15.5 g/dL (ref 13.0–17.0)
MCH: 29.9 pg (ref 26.0–34.0)
MCHC: 32.7 g/dL (ref 30.0–36.0)
MCV: 91.3 fL (ref 78.0–100.0)
PLATELETS: 106 10*3/uL — AB (ref 150–400)
RBC: 5.19 MIL/uL (ref 4.22–5.81)
RDW: 15.5 % (ref 11.5–15.5)
WBC: 11.7 10*3/uL — ABNORMAL HIGH (ref 4.0–10.5)

## 2018-06-24 LAB — PROTIME-INR
INR: 1.08
Prothrombin Time: 13.9 seconds (ref 11.4–15.2)

## 2018-06-24 LAB — HEPARIN LEVEL (UNFRACTIONATED): Heparin Unfractionated: 0.73 IU/mL — ABNORMAL HIGH (ref 0.30–0.70)

## 2018-06-24 LAB — APTT: aPTT: 100 seconds — ABNORMAL HIGH (ref 24–36)

## 2018-06-24 MED ORDER — CHLORHEXIDINE GLUCONATE CLOTH 2 % EX PADS
6.0000 | MEDICATED_PAD | Freq: Every day | CUTANEOUS | Status: DC
  Administered 2018-06-25: 6 via TOPICAL

## 2018-06-24 MED ORDER — APIXABAN 5 MG PO TABS
5.0000 mg | ORAL_TABLET | Freq: Two times a day (BID) | ORAL | Status: DC
  Administered 2018-06-24 – 2018-06-29 (×11): 5 mg via ORAL
  Filled 2018-06-24 (×11): qty 1

## 2018-06-24 MED ORDER — POTASSIUM CHLORIDE CRYS ER 20 MEQ PO TBCR
60.0000 meq | EXTENDED_RELEASE_TABLET | Freq: Once | ORAL | Status: AC
  Administered 2018-06-24: 60 meq via ORAL
  Filled 2018-06-24: qty 3

## 2018-06-24 NOTE — Progress Notes (Signed)
ANTICOAGULATION CONSULT NOTE  Pharmacy Consult for Apixaban>Heparin > apixaban Indication:  afib  Allergies  Allergen Reactions  . Amiodarone Rash    Rash resolved when amio stopped 2013, retried in 2017 with same reaction    Patient Measurements: Ht: 5'6" Wt: 92.2 kg Heparin Dose Weight: 83.5 kg  Vital Signs: Temp: 97.6 F (36.4 C) (06/30 0729) Temp Source: Oral (06/30 0729) BP: 110/80 (06/30 0729) Pulse Rate: 58 (06/30 0729)  Labs: Recent Labs    06/22/18 0330 06/22/18 1025 06/23/18 0315 06/24/18 0450 06/24/18 0451  HGB 15.7  --  16.2 15.5  --   HCT 46.8  --  49.7 47.4  --   PLT 115*  --  114* 106*  --   APTT  --  102* 96* 100*  --   LABPROT 16.9*  --  14.8 13.9  --   INR 1.38  --  1.17 1.08  --   HEPARINUNFRC  --  1.98* 0.98*  --  0.73*  CREATININE 4.07*  --  3.49* 3.29*  --     Estimated Creatinine Clearance: 21 mL/min (A) (by C-G formula based on SCr of 3.29 mg/dL (H)).    Assessment: 72 y/o transfer from outside hospital with afib and PE, pt started on apixaban for PE in RLL without heart strain on CTA at [facility] (last dose on 6/25 in AM).   Determined not to be PE but will anticoagulate for Afib Transitions to heparin for possible procedures.   s/p DCCV 6/28/19z> SR now back in Afib Amiodarone drip 60>30>60mg /hr  APTT 100 sec at goal this AM on heparin drip rate 900units/hr.   Heparin level starting to correlate. No overt bleeding or complications noted. Wt > 60kg, age < 80yo, Cr > 1.5  Goal of Therapy: Heparin level 0.3-0.7 units/mL aPTT 66-102 secs  Monitor platelets by anticoagulation protocol: Yes   Plan:  Stop heparin drip Apixaban 5mg  BID Monitor s/s bleeding   Leota Sauers Pharm.D. CPP, BCPS Clinical Pharmacist [phone] 06/24/2018 10:04 AM

## 2018-06-24 NOTE — Progress Notes (Signed)
Advanced Heart Failure Rounding Note  PCP-Cardiologist: No primary care provider on file.   Subjective:      Off NE. Remains on milrinone 0.25. CVP 6. Weight down.25 pounds on l;asix 15/hr. Co-ox 69%  Remains in rapid with HRs in 130-150s despite several amio boluses and drip at 60/h  CXR clearing but still with small to moderate R effusion. (Personally reviewed). CCM attempted R thoracentesis but minimal fluid on u/s so aborted.   Breathing better. Denies SOB, palpitations, orthopnea or PND. Creatinine 4.6-> 4.1  K 3.0   Objective:   Weight Range: 81.1 kg (178 lb 14.4 oz) Body mass index is 28.88 kg/m.   Vital Signs:                 Temp:  [97.6 F (36.4 C)-98.6 F (37 C)] 97.6 F (36.4 C) (06/28 0700) Pulse Rate:  [41-160] 54 (06/28 0700) Resp:  [13-25] 21 (06/28 0800) BP: (88-127)/(63-97) 88/63 (06/28 0800) SpO2:  [91 %-100 %] 99 % (06/28 0800) Weight:  [81.1 kg (178 lb 14.4 oz)] 81.1 kg (178 lb 14.4 oz) (06/28 0600) Last BM Date: 06/21/18  Weight change:      Filed Weights   06/20/18 0300 06/21/18 0600 06/22/18 0600  Weight: 92.2 kg (203 lb 4.2 oz) 89.2 kg (196 lb 10.4 oz) 81.1 kg (178 lb 14.4 oz)    Intake/Output:             Intake/Output Summary (Last 24 hours) at 06/22/2018 0958 Last data filed at 06/22/2018 0843    Gross per 24 hour  Intake 1901.41 ml  Output 8775 ml  Net -6873.59 ml                 Physical Exam    General:  Lying flat in bed. No resp difficulty HEENT: normal Neck: supple. LIJ TLC Carotids 2+ bilat; no bruits. No lymphadenopathy or thryomegaly appreciated. Cor: PMI laterally displaced. Tachy irreg Lungs: clear Abdomen: soft, nontender, nondistended. + rectal sheath hematoma No hepatosplenomegaly. No bruits or masses. Good bowel sounds. Extremities: no cyanosis, clubbing, rash, 1+ edema Neuro: alert & orientedx3, cranial nerves grossly intact. moves all 4 extremities w/o difficulty. Affect  pleasant   Telemetry   AF 140s Personally reviewed   Labs    CBC RecentLabs(last2labs)      Recent Labs    06/21/18 1654 06/22/18 0330  WBC 12.2* 11.7*  HGB 13.5 15.7  HCT 40.8 46.8  MCV 89.5 89.1  PLT 100* 115*     Basic Metabolic Panel RecentLabs(last2labs)       Recent Labs    06/20/18 2328 06/21/18 0347 06/22/18 0330  NA  --  133* 133*  K  --  3.6 3.0*  CL  --  87* 73*  CO2  --  30 42*  GLUCOSE  --  129* 152*  BUN  --  88* 83*  CREATININE  --  4.59* 4.07*  CALCIUM  --  7.2* 7.9*  MG 2.2 2.2  --   PHOS  --  6.9*  --      Liver Function Tests RecentLabs(last2labs)  No results for input(s): AST, ALT, ALKPHOS, BILITOT, PROT, ALBUMIN in the last 72 hours.   RecentLabs(last2labs)  No results for input(s): LIPASE, AMYLASE in the last 72 hours.   Cardiac Enzymes RecentLabs(last2labs)       Recent Labs    06/20/18 0420 06/20/18 0815 06/20/18 1806  TROPONINI 0.27* 0.32* 0.28*      BNP: BNP (last  3 results) RecentLabs(withinlast365days)     Recent Labs    06/19/18 2128  BNP 1,948.0*      ProBNP (last 3 results) RecentLabs(withinlast365days)  No results for input(s): PROBNP in the last 8760 hours.     D-Dimer RecentLabs(last2labs)  No results for input(s): DDIMER in the last 72 hours.   Hemoglobin A1C RecentLabs(last2labs)     Recent Labs    06/20/18 0113  HGBA1C 6.1*     Fasting Lipid Panel RecentLabs(last2labs)  Recent Labs    06/20/18 0113  CHOL 178  HDL 30*  LDLCALC 121*  TRIG 136  CHOLHDL 5.9     Thyroid Function Tests  RecentLabs(last2labs)  No results for input(s): TSH, T4TOTAL, T3FREE, THYROIDAB in the last 72 hours.  Invalid input(s): FREET3    Other results:   Imaging     Dg Chest Port 1 View    Result Date: 06/21/2018  CLINICAL DATA:  Followup pleural effusion. EXAM: PORTABLE CHEST 1 VIEW COMPARISON:  06/21/2018 at  5:46 a.m. and older exams FINDINGS: Opacity on the right, which obscures most of the right hemidiaphragm, is unchanged consistent with a moderate pleural effusion. Associated opacity at the right lung base is consistent with atelectasis. Vascular congestion and hazy perihilar lung opacity noted on the prior study has improved. No convincing residual congestive heart failure. No left pleural effusion.  No pneumothorax. Cardiac silhouette is enlarged. Left internal jugular central venous line is stable. IMPRESSION: 1. Improved lung aeration. Findings congestive heart failure have essentially resolved. No new lung abnormalities. 2. Persistent moderate right pleural effusion with associated atelectasis. Electronically Signed   By: Amie Portland M.D.   On: 06/21/2018 17:25      Medications:     Scheduled Medications:  .  sodium chloride     Intravenous  Once   .  amiodarone   150 mg  Intravenous  Once   .  multivitamin with minerals   1 tablet  Oral  Daily   .  patiromer   16.8 g  Oral  Once   .  potassium chloride   40 mEq  Oral  Once   .  potassium chloride   40 mEq  Oral  Once   .  sodium chloride flush   3 mL  Intravenous  Q12H   .  sodium chloride flush   3 mL  Intravenous  Q12H   .  sodium polystyrene   30 g  Rectal  Once     Infusions:  .  sodium chloride  Stopped (06/20/18 0700)   .  sodium chloride      .  amiodarone  60 mg/hr (06/22/18 0700)   .  ceFEPime (MAXIPIME) IV  200 mL/hr at 06/21/18 0929   .  furosemide (LASIX) infusion  15 mg/hr (06/22/18 0700)   .  heparin  900 Units/hr (06/22/18 0700)   .  milrinone  0.25 mcg/kg/min (06/22/18 0700)   .  norepinephrine (LEVOPHED) Adult infusion  0 mcg/min (06/21/18 0115)     PRN Medications:  sodium chloride, levalbuterol, ondansetron (ZOFRAN) IV, sodium chloride flush, sodium chloride flush    Patient Profile    Martin [name] is a42 yo  withPAF/FL, systolic HF due to NICM, HTN,previous rectus sheath hematoma who was transferred from [facility] due to cardiogenic shock in setting of recurrent AF.   Assessment/Plan   1. Cardiogenic shock, recurrent - EF 20-25% by Echo at [facility] 6/19 - Likely NICM due to tachy-induced CM which is recurrent for him  -  Central access placed with initial co-ox 40% now up to 68% on milrinone 0.25. NE off. - Volume status much improved with CVP down to 6 but still with peripheral edema. Continue lasix gtt for one more day - Now that he is somewhat improved will plan DC-CV to restore NSR as he can never tolerate AFL/A tach. This will not be elective as he will not get out of shock without it so will not need TEE DC-CV scheduled for 1p. - Continue heparin. Will switch back to Eliquis over the weekend. Discussed dosing with PharmD personally. - No ACE/ARB with AKI. No bblocker  2. AKI - Due to #1 - Improving with hemodynamic support. Creatinine 4.6-> 4.1  - Hyperkalemia resolved  3. Acute respiratory failure - Improved.   4. Paroxysmal AFL/atiral tach - Recurrent. Remains quite fast despite IV amio .Management as above. DC-CV scheduled for today at 1p - Continue amio at 60/hr. Rebolus now - Discussed need for AVN ablation and biv pacing to prevent recurrent near-death epsidoe like these but both he and his wife refuse   5. Moderate right pleural effusion with elevated PCT - no frank symptoms of infection but CT appearance concerning and PCT is up - On cefipime. Cx remains negative.  - CCM attempted thoracentesis 6/26  but no fluid left on u/s - CXR with improving effusion. Personally reviewed   6. Rectus sheath hematoma - chronic. No change  7. Pulmonary embolus - I have reviewed personally doubt this is present Will need AC for AF anyway    CRITICAL CARE Performed by: [name]  Total critical care time: 35 minutes  Critical care time was  exclusive of separately billable procedures and treating other patients.  Critical care was necessary to treat or prevent imminent or life-threatening deterioration.  Critical care was time spent personally by me (independent of midlevel providers or residents) on the following activities: development of treatment plan with patient and/or surrogate as well as nursing, discussions with consultants, evaluation of patient's response to treatment, examination of patient, obtaining history from patient or surrogate, ordering and performing treatments and interventions, ordering and review of laboratory studies, ordering and review of radiographic studies, pulse oximetry and re-evaluation of patient's condition.    Length of Stay: 3  [name], MD  06/22/2018, 9:58 AM  Advanced Heart Failure Team Pager [phone] (M-F; 7a - 4p)  Please contact [facility] for night-coverage after hours (4p -7a ) and weekends on [email or web address]

## 2018-06-24 NOTE — Plan of Care (Signed)
  Problem: Education: Goal: Knowledge of General Education information will improve Outcome: Progressing   Problem: Health Behavior/Discharge Planning: Goal: Ability to manage health-related needs will improve Outcome: Progressing   Problem: Clinical Measurements: Goal: Ability to maintain clinical measurements within normal limits will improve Outcome: Progressing Goal: Will remain free from infection Outcome: Progressing Goal: Diagnostic test results will improve Outcome: Progressing Goal: Respiratory complications will improve Outcome: Progressing Goal: Cardiovascular complication will be avoided Outcome: Progressing   Problem: Activity: Goal: Risk for activity intolerance will decrease Outcome: Progressing   Problem: Nutrition: Goal: Adequate nutrition will be maintained Outcome: Progressing   Problem: Coping: Goal: Level of anxiety will decrease Outcome: Progressing   Problem: Elimination: Goal: Will not experience complications related to bowel motility Outcome: Progressing Goal: Will not experience complications related to urinary retention Outcome: Progressing   Problem: Pain Managment: Goal: General experience of comfort will improve Outcome: Progressing   Problem: Safety: Goal: Ability to remain free from injury will improve Outcome: Progressing   Problem: Skin Integrity: Goal: Risk for impaired skin integrity will decrease Outcome: Progressing   Problem: Education: Goal: Ability to demonstrate management of disease process will improve Outcome: Progressing Goal: Ability to verbalize understanding of medication therapies will improve Outcome: Progressing   Problem: Activity: Goal: Capacity to carry out activities will improve Outcome: Not Progressing Note:  Weakness, requiring assistance for activities   Problem: Cardiac: Goal: Ability to achieve and maintain adequate cardiopulmonary perfusion will improve Outcome: Not Progressing Note:   Ef<15%, requiring gtts for cardiac support see [facility]

## 2018-06-24 NOTE — Progress Notes (Signed)
Advanced Heart Failure Rounding Note  PCP-Cardiologist: No primary care provider on file.   Subjective:    Underwent DC-CV on 6/28  Amio cut back from 60 to 30 yesterday. Back in AF overnight.  Off lasix. Creatinine continues to improve now 3.3. Remains on milrinone 0.125. Co-ox 69% CVP 7  Weight down to 174.   Feels OK. No palpitations, orthopnea or PND. K 3.4  Objective:   Weight Range: 79.3 kg (174 lb 13.2 oz) Body mass index is 28.22 kg/m.   Vital Signs:   Temp:  [97.5 F (36.4 C)-98.5 F (36.9 C)] 97.6 F (36.4 C) (06/30 0729) Pulse Rate:  [51-89] 58 (06/30 0729) Resp:  [15-25] 19 (06/30 0729) BP: (90-129)/(60-80) 110/80 (06/30 0729) SpO2:  [90 %-96 %] 96 % (06/30 0729) Weight:  [79.3 kg (174 lb 13.2 oz)] 79.3 kg (174 lb 13.2 oz) (06/30 0357) Last BM Date: 06/21/18(per patient)  Weight change: Filed Weights   06/22/18 0600 06/23/18 0500 06/24/18 0357  Weight: 81.1 kg (178 lb 14.4 oz) 85.6 kg (188 lb 11.4 oz) 79.3 kg (174 lb 13.2 oz)    Intake/Output:   Intake/Output Summary (Last 24 hours) at 06/24/2018 0923 Last data filed at 06/24/2018 0730 Gross per 24 hour  Intake 1045.69 ml  Output 1700 ml  Net -654.31 ml      Physical Exam    General:  Lying in bed. No resp difficulty HEENT: normal Neck: supple. JVP 7 Carotids 2+ bilat; no bruits. No lymphadenopathy or thryomegaly appreciated. Cor: PMI laterally displaced. IRR tachy Lungs: clear Abdomen: soft, nontender, nondistended. No hepatosplenomegaly. No bruits or masses. Good bowel sounds. Extremities: no cyanosis, clubbing, rash, edema Neuro: alert & orientedx3, cranial nerves grossly intact. moves all 4 extremities w/o difficulty. Affect pleasant   Telemetry   AF 120s Personally reviewed   Labs    CBC Recent Labs    06/23/18 0315 06/24/18 0450  WBC 10.8* 11.7*  HGB 16.2 15.5  HCT 49.7 47.4  MCV 90.4 91.3  PLT 114* 106*   Basic Metabolic Panel Recent Labs    15/40/08 0330  06/23/18 0315 06/24/18 0450  NA 133* 131* 129*  K 3.0* 3.9 3.4*  CL 73* 72* 74*  CO2 42* 42* 40*  GLUCOSE 152* 152* 135*  BUN 83* 76* 72*  CREATININE 4.07* 3.49* 3.29*  CALCIUM 7.9* 8.8* 8.7*  MG 2.4  --   --    Liver Function Tests No results for input(s): AST, ALT, ALKPHOS, BILITOT, PROT, ALBUMIN in the last 72 hours. No results for input(s): LIPASE, AMYLASE in the last 72 hours. Cardiac Enzymes No results for input(s): CKTOTAL, CKMB, CKMBINDEX, TROPONINI in the last 72 hours.  BNP: BNP (last 3 results) Recent Labs    06/19/18 2128  BNP 1,948.0*    ProBNP (last 3 results) No results for input(s): PROBNP in the last 8760 hours.   D-Dimer No results for input(s): DDIMER in the last 72 hours. Hemoglobin A1C No results for input(s): HGBA1C in the last 72 hours. Fasting Lipid Panel No results for input(s): CHOL, HDL, LDLCALC, TRIG, CHOLHDL, LDLDIRECT in the last 72 hours. Thyroid Function Tests No results for input(s): TSH, T4TOTAL, T3FREE, THYROIDAB in the last 72 hours.  Invalid input(s): FREET3  Other results:   Imaging    No results found.   Medications:     Scheduled Medications: . sodium chloride   Intravenous Once  . Chlorhexidine Gluconate Cloth  6 each Topical Daily  . multivitamin with minerals  1 tablet Oral Daily  . sodium chloride flush  10-40 mL Intracatheter Q12H  . sodium chloride flush  3 mL Intravenous Q12H    Infusions: . sodium chloride    . amiodarone 60 mg/hr (06/24/18 0500)  . ceFEPime (MAXIPIME) IV 1 g (06/23/18 1013)  . heparin 900 Units/hr (06/24/18 0500)  . milrinone 0.125 mcg/kg/min (06/24/18 0500)  . norepinephrine (LEVOPHED) Adult infusion 0 mcg/min (06/21/18 0115)    PRN Medications: levalbuterol, ondansetron (ZOFRAN) IV, sodium chloride flush, sodium chloride flush    Patient Profile    Martin [name] is a 72 yo with PAF/FL, systolic HF due to NICM, HTN,previous rectus sheath hematoma who was transferred from  [facility] due to cardiogenic shock in setting of recurrent AF.    Assessment/Plan   1. Cardiogenic shock, recurrent - EF 20-25% by Echo at [facility] 6/19 - Likely NICM due to tachy-induced CM which is recurrent for him  - Central access placed with initial co-ox 40% now improved - Hemodynamics much improved with milrinone support and DC-CV. Co-ox 69% on milrinone 0.125. Will stop milrinone - Volume status much improved CVP now 6-7. Off lasix. Likely restart tomorrow - DC-CV on 6/28. Back in AF yesterday. Amio increased back to 60 - Will switch to Eliquis today. Repeat DC-CV tomorrow after 3 doses  Discussed dosing with PharmD personally. - No ACE/ARB with AKI. No bblocker  2. AKI - Due to #1 - Improving with hemodynamic support. Creatinine 4.6-> 4.1>3.5>3.3 - Hyperkalemia resolved. k now low. Will supp  3. Acute respiratory failure - Improved.   4. Paroxysmal AFL/atrial tach - Recurrent. Unable to rate control. Now s/p DC-CV on 6/28 - Back in AF yesterday. Amio increased back to 60 - Will switch to Eliquis today. Repeat DC-CV tomorrow after 3 doses  Discussed dosing with PharmD personally. - Discussed need for AVN ablation and biv pacing to prevent recurrent near-death epsidoe like these but both he and his wife refuse   5. Moderate right pleural effusion with elevated PCT - no frank symptoms of infection but CT appearance concerning and PCT is up - On cefipime. Cx remains negative. PCT coming down - CCM attempted thoracentesis 6/26  but no fluid left on u/s - CXR with improving effusion. Will follow   6. Rectus sheath hematoma - chronic. No change  7. Pulmonary embolus - I have reviewed personally doubt this is present Will need AC for AF anyway   8. Dispo - CR to ambulate   Length of Stay: 5  [name], MD  06/24/2018, 9:23 AM  Advanced Heart Failure Team Pager [phone] (M-F; 7a - 4p)  Please contact [facility] for night-coverage  after hours (4p -7a ) and weekends on [email or web address]

## 2018-06-25 LAB — CULTURE, BLOOD (ROUTINE X 2)
Culture: NO GROWTH
Culture: NO GROWTH
Special Requests: ADEQUATE

## 2018-06-25 LAB — CBC
HEMATOCRIT: 46.7 % (ref 39.0–52.0)
HEMOGLOBIN: 15.2 g/dL (ref 13.0–17.0)
MCH: 29.9 pg (ref 26.0–34.0)
MCHC: 32.5 g/dL (ref 30.0–36.0)
MCV: 91.7 fL (ref 78.0–100.0)
Platelets: 108 10*3/uL — ABNORMAL LOW (ref 150–400)
RBC: 5.09 MIL/uL (ref 4.22–5.81)
RDW: 15.7 % — ABNORMAL HIGH (ref 11.5–15.5)
WBC: 13.4 10*3/uL — ABNORMAL HIGH (ref 4.0–10.5)

## 2018-06-25 LAB — COOXEMETRY PANEL
CARBOXYHEMOGLOBIN: 1.8 % — AB (ref 0.5–1.5)
Carboxyhemoglobin: 1.5 % (ref 0.5–1.5)
METHEMOGLOBIN: 1.5 % (ref 0.0–1.5)
Methemoglobin: 1 % (ref 0.0–1.5)
O2 SAT: 63.1 %
O2 Saturation: 58.3 %
TOTAL HEMOGLOBIN: 14.8 g/dL (ref 12.0–16.0)
Total hemoglobin: 15.5 g/dL (ref 12.0–16.0)

## 2018-06-25 LAB — BASIC METABOLIC PANEL
Anion gap: 12 (ref 5–15)
BUN: 64 mg/dL — AB (ref 8–23)
CHLORIDE: 82 mmol/L — AB (ref 98–111)
CO2: 38 mmol/L — ABNORMAL HIGH (ref 22–32)
CREATININE: 2.71 mg/dL — AB (ref 0.61–1.24)
Calcium: 8.6 mg/dL — ABNORMAL LOW (ref 8.9–10.3)
GFR calc Af Amer: 26 mL/min — ABNORMAL LOW (ref >60)
GFR calc non Af Amer: 22 mL/min — ABNORMAL LOW (ref >60)
Glucose, Bld: 139 mg/dL — ABNORMAL HIGH (ref 70–99)
POTASSIUM: 3.5 mmol/L (ref 3.5–5.1)
SODIUM: 132 mmol/L — AB (ref 135–145)

## 2018-06-25 MED ORDER — SODIUM CHLORIDE 0.9% FLUSH
3.0000 mL | Freq: Two times a day (BID) | INTRAVENOUS | Status: DC
  Administered 2018-06-25 – 2018-06-28 (×3): 3 mL via INTRAVENOUS

## 2018-06-25 MED ORDER — SODIUM CHLORIDE 0.9% FLUSH: 3.0000 mL | INTRAVENOUS | Status: DC | PRN

## 2018-06-25 MED ORDER — SODIUM CHLORIDE 0.9 % IV SOLN: 250.0000 mL | INTRAVENOUS | Status: DC

## 2018-06-25 MED ORDER — MILRINONE LACTATE IN DEXTROSE 20-5 MG/100ML-% IV SOLN
0.1250 ug/kg/min | INTRAVENOUS | Status: DC
  Administered 2018-06-25 – 2018-06-27 (×2): 0.125 ug/kg/min via INTRAVENOUS
  Filled 2018-06-25 (×2): qty 100

## 2018-06-25 NOTE — Progress Notes (Signed)
CARDIAC REHAB PHASE I   PRE:  Rate/Rhythm: 114 Afib  BP:  Sitting: 108/88      SaO2: 96 RA  MODE:  Ambulation: 150 ft 122 peak HR  POST:  Rate/Rhythm: 112 Afib  BP:  Sitting: 91/71    SaO2: 94 RA   Pt ambulated 163ft standby assist with rollator. Pt c/o feeling tired. Hopeful for cardioversion later today v tomorrow. Encouraged to ambulate as able. Will continue to follow.  [id number] Reynold Bowen, RN BSN 06/25/2018 10:52 AM

## 2018-06-25 NOTE — Care Management Note (Signed)
Case Management Note  Patient Details  Name: Sakae Borowsky MRN: [id number] Date of Birth: Jan 11, 1946  Subjective/Objective:  Pt admitted with recurrent A fib                  Action/Plan:   PTA independent from home with wife.  Per wife - pt has PCP and will remain with current PCP - wife states they were recently assigned to PCP by current insurance company.  Wife denied barriers with obtaining/paying for medications.  CM will continue to follow for discharge needs   Expected Discharge Date:                  Expected Discharge Plan:     In-House Referral:  Clinical Social Work  Discharge planning Services  CM Consult  Post Acute Care Choice:    Choice offered to:     DME Arranged:    DME Agency:     HH Arranged:    HH Agency:     Status of Service:  In process, will continue to follow  If discussed at Long Length of Stay Meetings, dates discussed:    Additional Comments:  Cherylann Parr, RN 06/25/2018, 3:14 PM

## 2018-06-25 NOTE — H&P (View-Only) (Signed)
Advanced Heart Failure Rounding Note  PCP-Cardiologist: No primary care provider on file.   Subjective:    Underwent DC-CV on 6/28  Reverted back to AF on 6/29. Yesterday amio increased to 60 mg per hour and heparin switched to Eliquis. Remains in AF ~ 120  Denies SOB, orthopnea or PND. Marland Kitchen Denies N/V. Creatinine down 3.3-> 2.7. Of cefipime for PNA.    Objective:   Weight Range: 178 lb 12.7 oz (81.1 kg) Body mass index is 28.86 kg/m.   Vital Signs:   Temp:  [97.8 F (36.6 C)-98.6 F (37 C)] 98.6 F (37 C) (07/01 0333) Pulse Rate:  [50-117] 116 (07/01 0400) Resp:  [17-118] 17 (07/01 0400) BP: (84-115)/(60-98) 115/77 (07/01 0400) SpO2:  [93 %-98 %] 93 % (07/01 0400) Weight:  [178 lb 12.7 oz (81.1 kg)] 178 lb 12.7 oz (81.1 kg) (07/01 0333) Last BM Date: 06/21/18  Weight change: Filed Weights   06/23/18 0500 06/24/18 0357 06/25/18 0333  Weight: 188 lb 11.4 oz (85.6 kg) 174 lb 13.2 oz (79.3 kg) 178 lb 12.7 oz (81.1 kg)    Intake/Output:   Intake/Output Summary (Last 24 hours) at 06/25/2018 0759 Last data filed at 06/25/2018 0600 Gross per 24 hour  Intake 1529.31 ml  Output 1685 ml  Net -155.69 ml      Physical Exam   CVP 2  General:   No resp difficulty. Lying flat In bed.  HEENT: normal anicteric poor dentition  Neck: supple. no JVD. Carotids 2+ bilat; no bruits. No lymphadenopathy or thryomegaly appreciated.LIJ Cor: PMI nondisplaced. Irregular tachy rate & rhythm. No rubs, gallops or murmurs. Lungs: Decreased in the bases on room air.  No wheeze Abdomen: obese soft, nontender, nondistended. No hepatosplenomegaly. No bruits or masses. Good bowel sounds. Extremities: no cyanosis, clubbing, rash, edema Neuro: alert & oriented x 3, cranial nerves grossly intact. moves all 4 extremities w/o difficulty. Affect pleasant  Telemetry   A Fib personally reviewed. 120s    Labs    CBC Recent Labs    06/24/18 0450 06/25/18 0437  WBC 11.7* 13.4*  HGB 15.5 15.2    HCT 47.4 46.7  MCV 91.3 91.7  PLT 106* 108*   Basic Metabolic Panel Recent Labs    59/16/38 0450 06/25/18 0437  NA 129* 132*  K 3.4* 3.5  CL 74* 82*  CO2 40* 38*  GLUCOSE 135* 139*  BUN 72* 64*  CREATININE 3.29* 2.71*  CALCIUM 8.7* 8.6*   Liver Function Tests No results for input(s): AST, ALT, ALKPHOS, BILITOT, PROT, ALBUMIN in the last 72 hours. No results for input(s): LIPASE, AMYLASE in the last 72 hours. Cardiac Enzymes No results for input(s): CKTOTAL, CKMB, CKMBINDEX, TROPONINI in the last 72 hours.  BNP: BNP (last 3 results) Recent Labs    06/19/18 2128  BNP 1,948.0*    ProBNP (last 3 results) No results for input(s): PROBNP in the last 8760 hours.   D-Dimer No results for input(s): DDIMER in the last 72 hours. Hemoglobin A1C No results for input(s): HGBA1C in the last 72 hours. Fasting Lipid Panel No results for input(s): CHOL, HDL, LDLCALC, TRIG, CHOLHDL, LDLDIRECT in the last 72 hours. Thyroid Function Tests No results for input(s): TSH, T4TOTAL, T3FREE, THYROIDAB in the last 72 hours.  Invalid input(s): FREET3  Other results:   Imaging    No results found.   Medications:     Scheduled Medications: . sodium chloride   Intravenous Once  . apixaban  5 mg Oral  BID  . Chlorhexidine Gluconate Cloth  6 each Topical Daily  . multivitamin with minerals  1 tablet Oral Daily  . sodium chloride flush  10-40 mL Intracatheter Q12H  . sodium chloride flush  3 mL Intravenous Q12H    Infusions: . sodium chloride    . amiodarone 60 mg/hr (06/25/18 0600)  . ceFEPime (MAXIPIME) IV 1 g (06/24/18 1000)  . milrinone 0.125 mcg/kg/min (06/25/18 0600)  . norepinephrine (LEVOPHED) Adult infusion 0 mcg/min (06/21/18 0115)    PRN Medications: levalbuterol, ondansetron (ZOFRAN) IV, sodium chloride flush, sodium chloride flush    Patient Profile    Martin [name] is a 72 yo with PAF/FL, systolic HF due to NICM, HTN,previous rectus sheath hematoma who  was transferred from [facility] due to cardiogenic shock in setting of recurrent AF.    Assessment/Plan   1. Cardiogenic shock, recurrent - EF 20-25% by Echo at [facility] 6/19 - Likely NICM due to tachy-induced CM which is recurrent for him  - Central access placed with initial co-ox 40% now improved - CO-OX 58%. Stop milrinone.  - CVP 2. Continue to hold lasix.  -  Renal function coming down.  -  No ACE/ARB with AKI. No blocker with shock.   2. AKI - Due to #1 - Improving with hemodynamic support. Creatinine 4.6-> 4.1>3.5>3.3>2.7  3. Acute respiratory failure - Improved. Now on room air sats stable.   4. Paroxysmal AFL/atrial tach/RVR  -Now s/p DC-CV on 6/28 - Remains in A fib RVR. Continue amio 60 mg per hour.  - Switched to eliquis yesterday. Set up DC-CV hopefully this afternoon. - Discussed need for AVN ablation and biv pacing to prevent recurrent near-death epsidoe like these but both he and his wife refuse   5. Moderate right pleural effusion with elevated PCT - no frank symptoms of infection but CT appearance concerning and PCT is up - On cefipime. Cx remains negative. PCT coming down - CCM attempted thoracentesis 6/26  but no fluid left on u/s  6. Rectus sheath hematoma - chronic. No change  7. Pulmonary embolus - Not thought to have PE.  Will need AC for AF anyway   8. Dispo - CR to ambulate   Length of Stay: 6  Martin Clegg, NP  06/25/2018, 7:59 AM  Advanced Heart Failure Team Pager [phone] (M-F; 7a - 4p)  Please contact [facility] for night-coverage after hours (4p -7a ) and weekends on [email or web address]   Patient seen and examined with Martin Becket, NP. We discussed all aspects of the encounter. I agree with the assessment and plan as stated above.   He is back in AF. Will continue amiodarone gtt. Co-ox 58% on milrinone 0.125. Would continue milrinone one more so he remains optimized going into DC-CV. Plan repeat DC-CV tomorrow. CVP remains low.  Continue to hold diuretics. Creatinine improving with hemodynamic support. Would continue cefipim for 7-day course. Discussed dosing with PharmD personally.  Discussed need for AVN ablation and biv pacing to prevent recurrent near-death epsidoe like these but both he and his wife refuse.  Martin Meres, MD  2:54 PM

## 2018-06-25 NOTE — Progress Notes (Signed)
Nutrition Brief Note  Patient identified on the Malnutrition Screening Tool (MST) Report  Wt Readings from Last 15 Encounters:  06/25/18 175 lb 11.3 oz (79.7 kg)  04/05/17 185 lb 6.4 oz (84.1 kg)  02/22/17 185 lb (83.9 kg)  02/07/17 183 lb 1.6 oz (83.1 kg)  11/03/16 189 lb (85.7 kg)  09/14/16 187 lb 4 oz (84.9 kg)  06/06/16 188 lb (85.3 kg)  05/27/16 182 lb 5.1 oz (82.7 kg)  06/04/15 197 lb 12 oz (89.7 kg)  04/23/15 198 lb 12.8 oz (90.2 kg)  04/01/15 192 lb 4 oz (87.2 kg)  03/17/15 195 lb 12.8 oz (88.8 kg)  03/10/15 191 lb 12.8 oz (87 kg)  03/07/15 194 lb 0.1 oz (88 kg)  12/03/12 207 lb (93.9 kg)   Mr. Zerbe is a 72 yo with PAF/FL, systolic HF due to NICM, HTN,previous rectus sheath hematoma who was transferred from [facility] due to cardiogenic shock in setting of recurrent AF.   Pt admitted with acute respiratory failure with hypoxia.   Spoke with pt and wife at bedside. Pt wife provided most of the hx, due to pt sleeping. Pt wife reports that pt typically has a very good appetite- he usually consumes 2-3 meals per day and an occasional snack at night ("he's ameat and potatoes kind of guy"). Per wife, meals consists of meat, potatoes, and vegetables. He has consumed 100% of meals over the past 24 hours. Intake has improved greatly since transfer from [facility]. Per wife, intake was minimal for 3 days PTA related to fluid overload and respiratory difficulty.   Per pt wife, UBW is around 185# prior to heart failure diagnosis. She suspects wt loss related to diuresis.   Nutrition-Focused physical exam completed. Findings are no fat depletion, no muscle depletion, and moderate edema.   Body mass index is 28.36 kg/m. Patient meets criteria for overweight based on current BMI.   Current diet order is 2 grams sodium with 2 L fluid restriction, patient is consuming approximately 100% of meals at this time. Labs and medications reviewed.   No nutrition interventions warranted  at this time. If nutrition issues arise, please consult RD.   Tosca Pletz A. Mayford Knife, RD, LDN, CDE Pager: [phone] After hours Pager: [phone]

## 2018-06-25 NOTE — Progress Notes (Addendum)
  Advanced Heart Failure Rounding Note  PCP-Cardiologist: No primary care provider on file.   Subjective:    Underwent DC-CV on 6/28  Reverted back to AF on 6/29. Yesterday amio increased to 60 mg per hour and heparin switched to Eliquis. Remains in AF ~ 120  Denies SOB, orthopnea or PND. . Denies N/V. Creatinine down 3.3-> 2.7. Of cefipime for PNA.    Objective:   Weight Range: 178 lb 12.7 oz (81.1 kg) Body mass index is 28.86 kg/m.   Vital Signs:   Temp:  [97.8 F (36.6 C)-98.6 F (37 C)] 98.6 F (37 C) (07/01 0333) Pulse Rate:  [50-117] 116 (07/01 0400) Resp:  [17-118] 17 (07/01 0400) BP: (84-115)/(60-98) 115/77 (07/01 0400) SpO2:  [93 %-98 %] 93 % (07/01 0400) Weight:  [178 lb 12.7 oz (81.1 kg)] 178 lb 12.7 oz (81.1 kg) (07/01 0333) Last BM Date: 06/21/18  Weight change: Filed Weights   06/23/18 0500 06/24/18 0357 06/25/18 0333  Weight: 188 lb 11.4 oz (85.6 kg) 174 lb 13.2 oz (79.3 kg) 178 lb 12.7 oz (81.1 kg)    Intake/Output:   Intake/Output Summary (Last 24 hours) at 06/25/2018 0759 Last data filed at 06/25/2018 0600 Gross per 24 hour  Intake 1529.31 ml  Output 1685 ml  Net -155.69 ml      Physical Exam   CVP 2  General:   No resp difficulty. Lying flat In bed.  HEENT: normal anicteric poor dentition  Neck: supple. no JVD. Carotids 2+ bilat; no bruits. No lymphadenopathy or thryomegaly appreciated.LIJ Cor: PMI nondisplaced. Irregular tachy rate & rhythm. No rubs, gallops or murmurs. Lungs: Decreased in the bases on room air.  No wheeze Abdomen: obese soft, nontender, nondistended. No hepatosplenomegaly. No bruits or masses. Good bowel sounds. Extremities: no cyanosis, clubbing, rash, edema Neuro: alert & oriented x 3, cranial nerves grossly intact. moves all 4 extremities w/o difficulty. Affect pleasant  Telemetry   A Fib personally reviewed. 120s    Labs    CBC Recent Labs    06/24/18 0450 06/25/18 0437  WBC 11.7* 13.4*  HGB 15.5 15.2    HCT 47.4 46.7  MCV 91.3 91.7  PLT 106* 108*   Basic Metabolic Panel Recent Labs    06/24/18 0450 06/25/18 0437  NA 129* 132*  K 3.4* 3.5  CL 74* 82*  CO2 40* 38*  GLUCOSE 135* 139*  BUN 72* 64*  CREATININE 3.29* 2.71*  CALCIUM 8.7* 8.6*   Liver Function Tests No results for input(s): AST, ALT, ALKPHOS, BILITOT, PROT, ALBUMIN in the last 72 hours. No results for input(s): LIPASE, AMYLASE in the last 72 hours. Cardiac Enzymes No results for input(s): CKTOTAL, CKMB, CKMBINDEX, TROPONINI in the last 72 hours.  BNP: BNP (last 3 results) Recent Labs    06/19/18 2128  BNP 1,948.0*    ProBNP (last 3 results) No results for input(s): PROBNP in the last 8760 hours.   D-Dimer No results for input(s): DDIMER in the last 72 hours. Hemoglobin A1C No results for input(s): HGBA1C in the last 72 hours. Fasting Lipid Panel No results for input(s): CHOL, HDL, LDLCALC, TRIG, CHOLHDL, LDLDIRECT in the last 72 hours. Thyroid Function Tests No results for input(s): TSH, T4TOTAL, T3FREE, THYROIDAB in the last 72 hours.  Invalid input(s): FREET3  Other results:   Imaging    No results found.   Medications:     Scheduled Medications: . sodium chloride   Intravenous Once  . apixaban  5 mg Oral   BID  . Chlorhexidine Gluconate Cloth  6 each Topical Daily  . multivitamin with minerals  1 tablet Oral Daily  . sodium chloride flush  10-40 mL Intracatheter Q12H  . sodium chloride flush  3 mL Intravenous Q12H    Infusions: . sodium chloride    . amiodarone 60 mg/hr (06/25/18 0600)  . ceFEPime (MAXIPIME) IV 1 g (06/24/18 1000)  . milrinone 0.125 mcg/kg/min (06/25/18 0600)  . norepinephrine (LEVOPHED) Adult infusion 0 mcg/min (06/21/18 0115)    PRN Medications: levalbuterol, ondansetron (ZOFRAN) IV, sodium chloride flush, sodium chloride flush    Patient Profile    Mr. Fleer is a 72 yo with PAF/FL, systolic HF due to NICM, HTN,previous rectus sheath hematoma who  was transferred from [facility] due to cardiogenic shock in setting of recurrent AF.    Assessment/Plan   1. Cardiogenic shock, recurrent - EF 20-25% by Echo at [facility] 6/19 - Likely NICM due to tachy-induced CM which is recurrent for him  - Central access placed with initial co-ox 40% now improved - CO-OX 58%. Stop milrinone.  - CVP 2. Continue to hold lasix.  -  Renal function coming down.  -  No ACE/ARB with AKI. No blocker with shock.   2. AKI - Due to #1 - Improving with hemodynamic support. Creatinine 4.6-> 4.1>3.5>3.3>2.7  3. Acute respiratory failure - Improved. Now on room air sats stable.   4. Paroxysmal AFL/atrial tach/RVR  -Now s/p DC-CV on 6/28 - Remains in A fib RVR. Continue amio 60 mg per hour.  - Switched to eliquis yesterday. Set up DC-CV hopefully this afternoon. - Discussed need for AVN ablation and biv pacing to prevent recurrent near-death epsidoe like these but both he and his wife refuse   5. Moderate right pleural effusion with elevated PCT - no frank symptoms of infection but CT appearance concerning and PCT is up - On cefipime. Cx remains negative. PCT coming down - CCM attempted thoracentesis 6/26  but no fluid left on u/s  6. Rectus sheath hematoma - chronic. No change  7. Pulmonary embolus - Not thought to have PE.  Will need AC for AF anyway   8. Dispo - CR to ambulate   Length of Stay: 6  [name], NP  06/25/2018, 7:59 AM  Advanced Heart Failure Team Pager [phone] (M-F; 7a - 4p)  Please contact [facility] for night-coverage after hours (4p -7a ) and weekends on [email or web address]   Patient seen and examined with [name], NP. We discussed all aspects of the encounter. I agree with the assessment and plan as stated above.   He is back in AF. Will continue amiodarone gtt. Co-ox 58% on milrinone 0.125. Would continue milrinone one more so he remains optimized going into DC-CV. Plan repeat DC-CV tomorrow. CVP remains low.  Continue to hold diuretics. Creatinine improving with hemodynamic support. Would continue cefipim for 7-day course. Discussed dosing with PharmD personally.  Discussed need for AVN ablation and biv pacing to prevent recurrent near-death epsidoe like these but both he and his wife refuse.  Denissa Cozart, MD  2:54 PM     

## 2018-06-25 NOTE — Plan of Care (Signed)
  Problem: Education: Goal: Knowledge of General Education information will improve Outcome: Progressing   Problem: Health Behavior/Discharge Planning: Goal: Ability to manage health-related needs will improve Outcome: Progressing   Problem: Clinical Measurements: Goal: Ability to maintain clinical measurements within normal limits will improve Outcome: Progressing Goal: Will remain free from infection Outcome: Progressing Goal: Diagnostic test results will improve Outcome: Progressing Goal: Respiratory complications will improve Outcome: Progressing Goal: Cardiovascular complication will be avoided Outcome: Progressing   Problem: Nutrition: Goal: Adequate nutrition will be maintained Outcome: Progressing   Problem: Coping: Goal: Level of anxiety will decrease Outcome: Progressing   Problem: Elimination: Goal: Will not experience complications related to bowel motility Outcome: Progressing Goal: Will not experience complications related to urinary retention Outcome: Progressing   Problem: Pain Managment: Goal: General experience of comfort will improve Outcome: Progressing   Problem: Safety: Goal: Ability to remain free from injury will improve Outcome: Progressing   Problem: Skin Integrity: Goal: Risk for impaired skin integrity will decrease Outcome: Progressing   Problem: Education: Goal: Ability to demonstrate management of disease process will improve Outcome: Progressing Goal: Ability to verbalize understanding of medication therapies will improve Outcome: Progressing   Problem: Activity: Goal: Capacity to carry out activities will improve Outcome: Progressing   Problem: Activity: Goal: Risk for activity intolerance will decrease Outcome: Not Progressing Note:  Weakness, increasing activity for ADLs, SOBE   Problem: Cardiac: Goal: Ability to achieve and maintain adequate cardiopulmonary perfusion will improve Outcome: Not Progressing Note:   Drawing coox's to tritrate milrinone gtt

## 2018-06-26 ENCOUNTER — Encounter (HOSPITAL_COMMUNITY)
Admission: AD | Disposition: A | Discharge status: Home / Self Care | Payer: Self-pay | Source: Other Acute Inpatient Hospital | Attending: Internal Medicine

## 2018-06-26 ENCOUNTER — Inpatient Hospital Stay (HOSPITAL_COMMUNITY): Payer: Medicare HMO | Admitting: Anesthesiology

## 2018-06-26 ENCOUNTER — Encounter (HOSPITAL_COMMUNITY): Payer: Self-pay

## 2018-06-26 HISTORY — PX: CARDIOVERSION: SHX1299

## 2018-06-26 LAB — BASIC METABOLIC PANEL
Anion gap: 8 (ref 5–15)
BUN: 54 mg/dL — AB (ref 8–23)
CALCIUM: 8.4 mg/dL — AB (ref 8.9–10.3)
CO2: 36 mmol/L — ABNORMAL HIGH (ref 22–32)
Chloride: 83 mmol/L — ABNORMAL LOW (ref 98–111)
Creatinine, Ser: 2.4 mg/dL — ABNORMAL HIGH (ref 0.61–1.24)
GFR calc Af Amer: 30 mL/min — ABNORMAL LOW (ref >60)
GFR, EST NON AFRICAN AMERICAN: 26 mL/min — AB (ref >60)
GLUCOSE: 125 mg/dL — AB (ref 70–99)
POTASSIUM: 3.6 mmol/L (ref 3.5–5.1)
Sodium: 127 mmol/L — ABNORMAL LOW (ref 135–145)

## 2018-06-26 LAB — COOXEMETRY PANEL
Carboxyhemoglobin: 1.4 % (ref 0.5–1.5)
Methemoglobin: 1.4 % (ref 0.0–1.5)
O2 SAT: 59.5 %
TOTAL HEMOGLOBIN: 16.2 g/dL — AB (ref 12.0–16.0)

## 2018-06-26 LAB — CBC
HCT: 48.2 % (ref 39.0–52.0)
Hemoglobin: 15.7 g/dL (ref 13.0–17.0)
MCH: 29.7 pg (ref 26.0–34.0)
MCHC: 32.6 g/dL (ref 30.0–36.0)
MCV: 91.1 fL (ref 78.0–100.0)
PLATELETS: 105 10*3/uL — AB (ref 150–400)
RBC: 5.29 MIL/uL (ref 4.22–5.81)
RDW: 15.6 % — AB (ref 11.5–15.5)
WBC: 14.6 10*3/uL — ABNORMAL HIGH (ref 4.0–10.5)

## 2018-06-26 LAB — MAGNESIUM: Magnesium: 2 mg/dL (ref 1.7–2.4)

## 2018-06-26 SURGERY — CARDIOVERSION
Anesthesia: General

## 2018-06-26 MED ORDER — PROPOFOL 500 MG/50ML IV EMUL
INTRAVENOUS | Status: DC | PRN
  Administered 2018-06-26: 60 ug/kg/min via INTRAVENOUS

## 2018-06-26 MED ORDER — PHENYLEPHRINE HCL 10 MG/ML IJ SOLN
INTRAMUSCULAR | Status: DC | PRN
  Administered 2018-06-26: 100 ug via INTRAVENOUS

## 2018-06-26 MED ORDER — LIDOCAINE HCL (CARDIAC) PF 100 MG/5ML IV SOSY
PREFILLED_SYRINGE | INTRAVENOUS | Status: DC | PRN
  Administered 2018-06-26: 50 mg via INTRAVENOUS

## 2018-06-26 MED ORDER — SODIUM CHLORIDE 0.9 % IV SOLN
INTRAVENOUS | Status: DC
  Administered 2018-06-26 (×2): via INTRAVENOUS

## 2018-06-26 NOTE — Care Management (Signed)
1.ELIQUIS 2.5 MG BID  COVER- YES  CO-PAY- $ 47.00  TIER- 3 DRUG  PRIOR APPROVAL - NO   2. ELIQUIS 5 MG BID  COVER- YES  CO-PAY- $ 47.00  TIER- 3 DRUG  PRIOR APPROVAL- NO   PREFERRD PHARMACY : YES [facility]

## 2018-06-26 NOTE — Progress Notes (Addendum)
Advanced Heart Failure Rounding Note  PCP-Cardiologist: No primary care provider on file.   Subjective:    Underwent DC-CV on 6/28  Reverted back to AF on 6/29.   Remain on amio 60 mg per hour + milrinone 0.125 mcg. CO-OX 59.5% this am. .   Earlier this morning central line pulled out. Now has 2 PIVs.    Denies SOB. No orthopnea or PND. No bleeding on Eliquis.  Complaining of fatigue.      Objective:   Weight Range: 154 lb 9.6 oz (70.1 kg) Body mass index is 24.95 kg/m.   Vital Signs:   Temp:  [97.4 F (36.3 C)-98.9 F (37.2 C)] 97.4 F (36.3 C) (07/02 0700) Pulse Rate:  [55-118] 55 (07/02 0700) Resp:  [20-25] 25 (07/02 0700) BP: (99-123)/(59-76) 117/74 (07/02 0700) SpO2:  [97 %-100 %] 100 % (07/02 0700) Weight:  [154 lb 9.6 oz (70.1 kg)-175 lb 11.3 oz (79.7 kg)] 154 lb 9.6 oz (70.1 kg) (07/02 0412) Last BM Date: 06/21/18  Weight change: Filed Weights   06/25/18 0333 06/25/18 0800 06/26/18 0412  Weight: 178 lb 12.7 oz (81.1 kg) 175 lb 11.3 oz (79.7 kg) 154 lb 9.6 oz (70.1 kg)    Intake/Output:   Intake/Output Summary (Last 24 hours) at 06/26/2018 0755 Last data filed at 06/26/2018 0600 Gross per 24 hour  Intake 1560.71 ml  Output 1025 ml  Net 535.71 ml      Physical Exam  General:  No resp difficulty. In bed.  HEENT: normal Neck: supple. JVP 5-6 .Bruising on left neck  Carotids 2+ bilat; no bruits. No lymphadenopathy or thryomegaly appreciated. Cor: PMI nondisplaced. Irregular rate & rhythm.  +s3 Lungs: clear no wheeze Abdomen: soft, nontender, distended. No hepatosplenomegaly. No bruits or masses. Good bowel sounds. Rectal sheath hematoma Extremities: no cyanosis, clubbing, rash, edema cool  Neuro: alert & oriented x 3, cranial nerves grossly intact. moves all 4 extremities w/o difficulty. Affect pleasant   Telemetry   A fib 120s personally reviewed   Labs    CBC Recent Labs    06/25/18 0437 06/26/18 0452  WBC 13.4* 14.6*  HGB 15.2 15.7   HCT 46.7 48.2  MCV 91.7 91.1  PLT 108* 105*   Basic Metabolic Panel Recent Labs    79/48/01 0437 06/26/18 0452  NA 132* 127*  K 3.5 3.6  CL 82* 83*  CO2 38* 36*  GLUCOSE 139* 125*  BUN 64* 54*  CREATININE 2.71* 2.40*  CALCIUM 8.6* 8.4*  MG  --  2.0   Liver Function Tests No results for input(s): AST, ALT, ALKPHOS, BILITOT, PROT, ALBUMIN in the last 72 hours. No results for input(s): LIPASE, AMYLASE in the last 72 hours. Cardiac Enzymes No results for input(s): CKTOTAL, CKMB, CKMBINDEX, TROPONINI in the last 72 hours.  BNP: BNP (last 3 results) Recent Labs    06/19/18 2128  BNP 1,948.0*    ProBNP (last 3 results) No results for input(s): PROBNP in the last 8760 hours.   D-Dimer No results for input(s): DDIMER in the last 72 hours. Hemoglobin A1C No results for input(s): HGBA1C in the last 72 hours. Fasting Lipid Panel No results for input(s): CHOL, HDL, LDLCALC, TRIG, CHOLHDL, LDLDIRECT in the last 72 hours. Thyroid Function Tests No results for input(s): TSH, T4TOTAL, T3FREE, THYROIDAB in the last 72 hours.  Invalid input(s): FREET3  Other results:   Imaging    No results found.   Medications:     Scheduled Medications: . sodium  chloride   Intravenous Once  . apixaban  5 mg Oral BID  . Chlorhexidine Gluconate Cloth  6 each Topical Daily  . multivitamin with minerals  1 tablet Oral Daily  . sodium chloride flush  10-40 mL Intracatheter Q12H  . sodium chloride flush  3 mL Intravenous Q12H  . sodium chloride flush  3 mL Intravenous Q12H    Infusions: . sodium chloride    . sodium chloride    . amiodarone 60 mg/hr (06/26/18 0600)  . ceFEPime (MAXIPIME) IV 1 g (06/25/18 1102)  . milrinone 0.125 mcg/kg/min (06/26/18 0600)  . norepinephrine (LEVOPHED) Adult infusion 0 mcg/min (06/21/18 0115)    PRN Medications: levalbuterol, ondansetron (ZOFRAN) IV, sodium chloride flush, sodium chloride flush, sodium chloride flush    Patient Profile     Martin [name] is a 72 yo with PAF/FL, systolic HF due to NICM, HTN,previous rectus sheath hematoma who was transferred from [facility] due to cardiogenic shock in setting of recurrent AF.    Assessment/Plan   1. Cardiogenic shock, recurrent - EF 20-25% by Echo at [facility] 6/19 - Likely NICM due to tachy-induced CM which is recurrent for him  - Central access placed with initial co-ox 40% now improved - Central line pulled out this morning but CO-OX was obtained earlier. CO-OX 59%. . NO CVP.  -Continue milrinone 0.125 mcg.  -- Continue to hold lasix.  -  Renal function continues to improve.   -  No ACE/ARB with AKI. No blocker with shock.   2. AKI - Due to #1 - Improving with hemodynamic support. Creatinine 4.6-> 4.1>3.5>3.3>2.7>2 - Holding diuretics.   3. Acute respiratory failure - Improved. Now on room air sats stable.   4. Paroxysmal AFL/atrial tach/RVR  -Now s/p DC-CV on 6/28 Plan for DC-CV today.  - Continue amio drip 60 mg/hr - Remains on eliquis 5 mg twice a day.  - Discussed need for AVN ablation and biv pacing to prevent recurrent near-death epsidoe like these but both he and his wife refuse   5. Moderate right pleural effusion with elevated PCT - no frank symptoms of infection but CT appearance concerning and PCT is up - On cefipime. Cx remains negative. PCT coming down - CCM attempted thoracentesis 6/26  but no fluid left on u/s  6. Rectus sheath hematoma - chronic. No change  7. Pulmonary embolus - Not thought to have PE.  Will need AC for AF anyway   8. Dispo - CR to ambulate   Length of Stay: 7  [name], NP  06/26/2018, 7:55 AM  Advanced Heart Failure Team Pager [phone] (M-F; 7a - 4p)  Please contact [facility] for night-coverage after hours (4p -7a ) and weekends on [email or web address]  Patient seen and examined with the above-signed Advanced Practice Provider and/or Housestaff. I personally reviewed laboratory data, imaging  studies and relevant notes. I independently examined the patient and formulated the important aspects of the plan. I have edited the note to reflect any of my changes or salient points. I have personally discussed the plan with the patient and/or family.  He remains tenuous. Co-ox marginal on low-dose milrinone. Still in AF with RVR despite IV amio. No bleeding on Eliquis. Volume status stable. Will plan DC-CV today. Continues to refuse AV node ablation with BiV. Finishing course of cefipime for PNA  [name], MD  1:31 PM

## 2018-06-26 NOTE — Anesthesia Preprocedure Evaluation (Signed)
Anesthesia Evaluation  Patient identified by MRN, date of birth, ID band Patient awake    Reviewed: Allergy & Precautions, NPO status , Patient's Chart, lab work & pertinent test results  Airway Mallampati: III  TM Distance: >3 FB     Dental  (+) Teeth Intact   Pulmonary     + decreased breath sounds      Cardiovascular hypertension,  Rhythm:Irregular Rate:Tachycardia     Neuro/Psych    GI/Hepatic   Endo/Other    Renal/GU      Musculoskeletal   Abdominal   Peds  Hematology   Anesthesia Other Findings   Reproductive/Obstetrics                             Anesthesia Physical Anesthesia Plan  ASA: III  Anesthesia Plan: General   Post-op Pain Management:    Induction: Intravenous  PONV Risk Score and Plan:   Airway Management Planned: Mask  Additional Equipment:   Intra-op Plan:   Post-operative Plan:   Informed Consent: I have reviewed the patients History and Physical, chart, labs and discussed the procedure including the risks, benefits and alternatives for the proposed anesthesia with the patient or authorized representative who has indicated his/her understanding and acceptance.     Plan Discussed with: CRNA and Anesthesiologist  Anesthesia Plan Comments:         Anesthesia Quick Evaluation

## 2018-06-26 NOTE — Transfer of Care (Signed)
Immediate Anesthesia Transfer of Care Note  Patient: Martin [name]  Procedure(s) Performed: CARDIOVERSION (N/A )  Patient Location: PACU  Anesthesia Type:MAC  Level of Consciousness: awake and alert   Airway & Oxygen Therapy: Patient Spontanous Breathing and Patient connected to nasal cannula oxygen  Post-op Assessment: Report given to RN and Post -op Vital signs reviewed and stable  Post vital signs: Reviewed and stable  Last Vitals:  Vitals Value Taken Time  BP    Temp 36.4 C 06/26/2018  1:42 PM  Pulse    Resp    SpO2      Last Pain:  Vitals:   06/26/18 1342  TempSrc: Oral  PainSc: 0-No pain         Complications: No apparent anesthesia complications

## 2018-06-26 NOTE — CV Procedure (Signed)
      DIRECT CURRENT CARDIOVERSION  NAMEKaydrian [name]   MRN: [id number] DOB:  29-May-1946   ADMIT DATE: 06/19/2018   INDICATIONS: Atrial fibrillation    PROCEDURE:   Informed consent was obtained prior to the procedure. The risks, benefits and alternatives for the procedure were discussed and the patient comprehended these risks. Once an appropriate time out was taken, the patient had the defibrillator pads placed in the anterior and posterior position. The patient then underwent sedation by the anesthesia service. Once an appropriate level of sedation was achieved, the patient received a single biphasic, synchronized 200J shock with prompt conversion to sinus rhythm. Post- cardioversion he had transient hypotension treated with neosynephrine.   [name], MD  1:33 PM

## 2018-06-26 NOTE — Progress Notes (Signed)
CARDIAC REHAB PHASE I   PRE:  Rate/Rhythm: 115 Afib with PVCs  BP:  Sitting: 107/79      SaO2: 100 RA  MODE:  Ambulation: 150 ft 135 peak HR  POST:  Rate/Rhythm: 121 Afib with PVCs  BP:  Sitting: 109/78    SaO2: 94 RA   Pt ambulated 169ft in hallway standby assist with rollator. Pt states he feels stronger today. Pt for DCCV later today. Will continue to follow.  [id number] Reynold Bowen, RN BSN 06/26/2018 10:43 AM

## 2018-06-26 NOTE — Interval H&P Note (Signed)
History and Physical Interval Note:  06/26/2018 1:26 PM  Martin [name]  has presented today for surgery, with the diagnosis of afib  The various methods of treatment have been discussed with the patient and family. After consideration of risks, benefits and other options for treatment, the patient has consented to  Procedure(s): CARDIOVERSION (N/A) as a surgical intervention .  The patient's history has been reviewed, patient examined, no change in status, stable for surgery.  I have reviewed the patient's chart and labs.  Questions were answered to the patient's satisfaction.     Deseree Zemaitis

## 2018-06-26 NOTE — Anesthesia Postprocedure Evaluation (Signed)
Anesthesia Post Note  Patient: Martin [name]  Procedure(s) Performed: CARDIOVERSION (N/A )     Patient location during evaluation: Endoscopy Anesthesia Type: General Level of consciousness: awake and alert Pain management: pain level controlled Vital Signs Assessment: post-procedure vital signs reviewed and stable Respiratory status: spontaneous breathing, nonlabored ventilation, respiratory function stable and patient connected to nasal cannula oxygen Cardiovascular status: blood pressure returned to baseline and stable Postop Assessment: no apparent nausea or vomiting Anesthetic complications: no    Last Vitals:  Vitals:   06/26/18 1414 06/26/18 1618  BP: 113/69 106/63  Pulse: 66 (!) 101  Resp: (!) 22 (!) 27  Temp:  37.1 C  SpO2: 100% 100%    Last Pain:  Vitals:   06/26/18 1618  TempSrc: Axillary  PainSc:                  [name] COKER

## 2018-06-26 NOTE — Progress Notes (Signed)
On bedside report pt's IJ found to be pulled completed out by pt. Pt didn't realize he had accidentally pulled it out. Site assessed, cleansed, covered with a new dressing. Milrinone and amio switched to peripheral IV's. Will update MD.   [place]  Trevel Dillenbeck, RN

## 2018-06-26 NOTE — Anesthesia Procedure Notes (Signed)
Procedure Name: MAC Date/Time: 06/26/2018 1:25 PM Performed by: Eligha Bridegroom, CRNA Pre-anesthesia Checklist: Emergency Drugs available, Patient identified, Suction available, Patient being monitored and Timeout performed Patient Re-evaluated:Patient Re-evaluated prior to induction Oxygen Delivery Method: Ambu bag Induction Type: IV induction

## 2018-06-27 LAB — BASIC METABOLIC PANEL
Anion gap: 11 (ref 5–15)
BUN: 47 mg/dL — ABNORMAL HIGH (ref 8–23)
CALCIUM: 8.4 mg/dL — AB (ref 8.9–10.3)
CO2: 34 mmol/L — AB (ref 22–32)
CREATININE: 2.2 mg/dL — AB (ref 0.61–1.24)
Chloride: 83 mmol/L — ABNORMAL LOW (ref 98–111)
GFR calc Af Amer: 33 mL/min — ABNORMAL LOW (ref >60)
GFR, EST NON AFRICAN AMERICAN: 29 mL/min — AB (ref >60)
Glucose, Bld: 124 mg/dL — ABNORMAL HIGH (ref 70–99)
Potassium: 3.5 mmol/L (ref 3.5–5.1)
Sodium: 128 mmol/L — ABNORMAL LOW (ref 135–145)

## 2018-06-27 LAB — CBC
HEMATOCRIT: 42.8 % (ref 39.0–52.0)
Hemoglobin: 14.1 g/dL (ref 13.0–17.0)
MCH: 29.5 pg (ref 26.0–34.0)
MCHC: 32.9 g/dL (ref 30.0–36.0)
MCV: 89.5 fL (ref 78.0–100.0)
PLATELETS: 96 10*3/uL — AB (ref 150–400)
RBC: 4.78 MIL/uL (ref 4.22–5.81)
RDW: 15.2 % (ref 11.5–15.5)
WBC: 15.3 10*3/uL — AB (ref 4.0–10.5)

## 2018-06-27 MED ORDER — FUROSEMIDE 40 MG PO TABS
40.0000 mg | ORAL_TABLET | Freq: Every day | ORAL | Status: DC
  Administered 2018-06-27 – 2018-06-29 (×3): 40 mg via ORAL
  Filled 2018-06-27 (×3): qty 1

## 2018-06-27 NOTE — Progress Notes (Addendum)
Advanced Heart Failure Rounding Note  PCP-Cardiologist: No primary care provider on file.   Subjective:    Underwent DC-CV on 6/28 and 7/2  Maintaining NSR. Still on amio gtt at 60.   Denies SOB, orthopnea or PND. Has not walked yet. No bleeding on Eliquis. Wants go home.    Objective:   Weight Range: 180 lb (81.6 kg) Body mass index is 29.05 kg/m.   Vital Signs:   Temp:  [97.4 F (36.3 C)-98.8 F (37.1 C)] 97.9 F (36.6 C) (07/03 0404) Pulse Rate:  [62-113] 64 (07/03 0404) Resp:  [12-27] 22 (07/03 0404) BP: (94-122)/(61-84) 122/62 (07/03 0404) SpO2:  [96 %-100 %] 97 % (07/03 0404) Weight:  [180 lb (81.6 kg)] 180 lb (81.6 kg) (07/03 0408) Last BM Date: 06/26/18  Weight change: Filed Weights   06/25/18 0800 06/26/18 0412 06/27/18 0408  Weight: 175 lb 11.3 oz (79.7 kg) 154 lb 9.6 oz (70.1 kg) 180 lb (81.6 kg)    Intake/Output:   Intake/Output Summary (Last 24 hours) at 06/27/2018 0802 Last data filed at 06/27/2018 0600 Gross per 24 hour  Intake 931.48 ml  Output 925 ml  Net 6.48 ml      Physical Exam   General:  Appears chronically ill.  No resp difficulty HEENT: normal Neck: supple. JVP 7-8  Carotids 2+ bilat; no bruits. No lymphadenopathy or thryomegaly appreciated. Cor: PMI laterally displaced. Regular rate & rhythm. No rubs, gallops or murmurs. Lungs: clear no wheeze Abdomen: soft, nontender, nondistended. No hepatosplenomegaly. No bruits or masses. Good bowel sounds. + rectus sheath hematoma Extremities: no cyanosis, clubbing, rash, trace edema Discussed dosing with PharmD personally.    Telemetry  NSR 60s personally reviewed.   Labs    CBC Recent Labs    06/26/18 0452 06/27/18 0229  WBC 14.6* 15.3*  HGB 15.7 14.1  HCT 48.2 42.8  MCV 91.1 89.5  PLT 105* 96*   Basic Metabolic Panel Recent Labs    69/62/95 0452 06/27/18 0229  NA 127* 128*  K 3.6 3.5  CL 83* 83*  CO2 36* 34*  GLUCOSE 125* 124*  BUN 54* 47*  CREATININE 2.40*  2.20*  CALCIUM 8.4* 8.4*  MG 2.0  --    Liver Function Tests No results for input(s): AST, ALT, ALKPHOS, BILITOT, PROT, ALBUMIN in the last 72 hours. No results for input(s): LIPASE, AMYLASE in the last 72 hours. Cardiac Enzymes No results for input(s): CKTOTAL, CKMB, CKMBINDEX, TROPONINI in the last 72 hours.  BNP: BNP (last 3 results) Recent Labs    06/19/18 2128  BNP 1,948.0*    ProBNP (last 3 results) No results for input(s): PROBNP in the last 8760 hours.   D-Dimer No results for input(s): DDIMER in the last 72 hours. Hemoglobin A1C No results for input(s): HGBA1C in the last 72 hours. Fasting Lipid Panel No results for input(s): CHOL, HDL, LDLCALC, TRIG, CHOLHDL, LDLDIRECT in the last 72 hours. Thyroid Function Tests No results for input(s): TSH, T4TOTAL, T3FREE, THYROIDAB in the last 72 hours.  Invalid input(s): FREET3  Other results:   Imaging    No results found.   Medications:     Scheduled Medications: . sodium chloride   Intravenous Once  . apixaban  5 mg Oral BID  . Chlorhexidine Gluconate Cloth  6 each Topical Daily  . multivitamin with minerals  1 tablet Oral Daily  . sodium chloride flush  10-40 mL Intracatheter Q12H  . sodium chloride flush  3 mL Intravenous Q12H  .  sodium chloride flush  3 mL Intravenous Q12H    Infusions: . sodium chloride    . sodium chloride    . sodium chloride 10 mL/hr at 06/26/18 0950  . amiodarone 30.06 mg/hr (06/27/18 1518)  . norepinephrine (LEVOPHED) Adult infusion 0 mcg/min (06/21/18 0115)    PRN Medications: levalbuterol, ondansetron (ZOFRAN) IV, sodium chloride flush, sodium chloride flush, sodium chloride flush    Patient Profile    [name] is a 72 yo with PAF/FL, systolic HF due to NICM, HTN,previous rectus sheath hematoma who was transferred from [facility] due to cardiogenic shock in setting of recurrent AF.    Assessment/Plan   1. Cardiogenic shock, recurrent - EF 20-25% by Echo  at [facility] 6/19 - Likely NICM due to tachy-induced CM which is recurrent for him  - Central access placed with initial co-ox 40% now improved -Stop milrinone 0.125 mcg.  -- Restart 40 mg lasix. .  -  Renal function continues to improve.   -  No ACE/ARB with AKI. No blocker with shock.   2. AKI - Due to #1 - Improving with hemodynamic support. Creatinine peaked 4.6 .  Todays creatinine 2.2    3. Acute respiratory failure - Improved. Now on room air sats stable.   4. Paroxysmal AFL/atrial tach/RVR  -Now s/p DC-CV on 6/28 and 7/2 Maintaining NSR.  - Remains on eliquis 5 mg twice a day.  - Discussed need for AVN ablation and biv pacing to prevent recurrent near-death epsidoe like these but both he and his wife refuse   5. Moderate right pleural effusion with elevated PCT - no frank symptoms of infection but CT appearance concerning and PCT is up - On cefipime. Cx remains negative. PCT coming down - CCM attempted thoracentesis 6/26  but no fluid left on u/s  6. Rectus sheath hematoma - chronic. No change  7. Pulmonary embolus - Not thought to have PE.  Will need AC for AF anyway   8. Dispo - CR to ambulate  Hopefully home 24-48 hours.  Will ask CM to check on price of eliquis.   Length of Stay: 8  [name], NP  06/27/2018, 8:02 AM  Advanced Heart Failure Team Pager [phone] (M-F; 7a - 4p)  Please contact [facility] for night-coverage after hours (4p -7a ) and weekends on [email or web address]  Patient seen and examined with the above-signed Advanced Practice Provider and/or Housestaff. I personally reviewed laboratory data, imaging studies and relevant notes. I independently examined the patient and formulated the important aspects of the plan. I have edited the note to reflect any of my changes or salient points. I have personally discussed the plan with the patient and/or family.  He remains in NSR after repeat DC-CV on amio gtt at 60. Will decrease to 30. HF  remains tenuous but volume status is improved. Will stop milrinone. Will restart po lasix. Continue Eliquis. Will need to ambulate today.   [name], MD  6:33 PM

## 2018-06-27 NOTE — Care Management Important Message (Signed)
Important Message  Patient Details  Name: Martin [name] MRN: [id number] Date of Birth: 1946/08/14   Medicare Important Message Given:  Yes Patient unable to sign. Unsigned copy left at bedside.   Tabria Steines P Caddie Randle 06/27/2018, 2:12 PM

## 2018-06-27 NOTE — Progress Notes (Signed)
Unable to complete CVPs or obtain Co-ox panel due to absence of central line.

## 2018-06-27 NOTE — Progress Notes (Signed)
CARDIAC REHAB PHASE I   PRE:  Rate/Rhythm: 69 SR    BP: sitting 100/55    SaO2: 100 RA  MODE:  Ambulation: 300 ft   POST:  Rate/Rhythm: 86 SR    BP: sitting 125/62     SaO2: 98 RA  Pt able to walk and maintain NSR. Slow pace with RW. Denied c/o, increased distance. To recliner and discussed HF booklet. Fair reception. Sounds like pt weighs at home but was eating Oodles of Noodles, which is 1820 mg Na+. Discussed this and reviewed low sodium diets. Set up HF video. [phone]   Harriet Masson CES, ACSM 06/27/2018 11:27 AM

## 2018-06-27 NOTE — Care Management Note (Signed)
Case Management Note  Patient Details  Name: Martin [name] MRN: [id number] Date of Birth: [birth year]  Subjective/Objective:  Pt admitted with recurrent A fib                  Action/Plan:   PTA independent from home with wife.  Per wife - pt has PCP and will remain with current PCP - wife states they were recently assigned to PCP by current insurance company.  Wife denied barriers with obtaining/paying for medications.  CM will continue to follow for discharge needs   Expected Discharge Date:                  Expected Discharge Plan:     In-House Referral:  Clinical Social Work  Discharge planning Services  CM Consult  Post Acute Care Choice:    Choice offered to:     DME Arranged:    DME Agency:     HH Arranged:    HH Agency:     Status of Service:  In process, will continue to follow  If discussed at Long Length of Stay Meetings, dates discussed:    Additional Comments: CM provided free 30 Eliquis card to pt.  Cm also informed pt of copay - pt denied barriers with copay.  CM contacted pharmacy of choice [facility] in [place] and verified pharmacy can fill prescription at discharge Martin Parr, RN 06/27/2018, 2:25 PM

## 2018-06-27 NOTE — Evaluation (Signed)
Physical Therapy Evaluation Patient Details Name: Martin [name] MRN: [id number] DOB: May 27, 1946 Today's Date: 06/27/2018   History of Present Illness  Martin [name] is a 72 y.o. male with medical history significant of CHF with EF of 15%, atrial fibrillation not taking AC, hyperlipidemia,, OSA, CKD 3, rectal sheath hematoma, right sided abdominal mass due to hx of bleeding 2/2 blood thinner use, who was transferred from [facility] due to SOB. Now s/p DC-CV on 6/28 and 7/2     Clinical Impression  Pt admitted with above diagnosis. Pt currently with functional limitations due to the deficits listed below (see PT Problem List). PTA, pt living with wife in 1 level home, independent with all mobility and ADLs, driving, community ambulating. Upon eval pt presents with mild balance and strength deficits, and decrease tolerance to activity. Ambulating unit with RW on RA, x3 standing rest breaks, SpO2 remained in 90s during visit. No concerns from PT standpoint to return home with wife once medically ready for d/c.  Pt will benefit from skilled PT to increase their independence and safety with mobility to allow discharge to the venue listed below.       Follow Up Recommendations Home health PT;Other (comment);Supervision for mobility/OOB(Patient refusing HHPT)    Equipment Recommendations  Rolling walker with 5" wheels    Recommendations for Other Services       Precautions / Restrictions Precautions Precautions: Fall Restrictions Weight Bearing Restrictions: No      Mobility  Bed Mobility Overal bed mobility: Modified Independent                Transfers Overall transfer level: Modified independent Equipment used: None;Rolling walker (2 wheeled)             General transfer comment: min guard for safety and line mgt  Ambulation/Gait Ambulation/Gait assistance: Min guard;Supervision Gait Distance (Feet): 250 Feet Assistive device: Rolling walker (2  wheeled) Gait Pattern/deviations: Step-to pattern;Step-through pattern Gait velocity: decreased   General Gait Details: pt ambulating with mild unsteadiness, on RA with SpO2 remaining in 90s. patient took x3 standing rest breaks, HRmax 95.   Stairs            Wheelchair Mobility    Modified Rankin (Stroke Patients Only)       Balance Overall balance assessment: Mild deficits observed, not formally tested                                           Pertinent Vitals/Pain Pain Assessment: No/denies pain    Home Living Family/patient expects to be discharged to:: Private residence Living Arrangements: Spouse/significant other Available Help at Discharge: Family;Available 24 hours/day Type of Home: House Home Access: Stairs to enter Entrance Stairs-Rails: None Entrance Stairs-Number of Steps: 1 Home Layout: One level        Prior Function Level of Independence: Independent               Hand Dominance        Extremity/Trunk Assessment   Upper Extremity Assessment Upper Extremity Assessment: Overall WFL for tasks assessed    Lower Extremity Assessment Lower Extremity Assessment: Overall WFL for tasks assessed       Communication   Communication: No difficulties  Cognition Arousal/Alertness: Awake/alert Behavior During Therapy: WFL for tasks assessed/performed Overall Cognitive Status: Within Functional Limits for tasks assessed  General Comments      Exercises     Assessment/Plan    PT Assessment Patient needs continued PT services  PT Problem List Decreased strength;Decreased range of motion;Decreased activity tolerance;Decreased balance;Decreased mobility;Cardiopulmonary status limiting activity       PT Treatment Interventions DME instruction;Gait training;Stair training;Functional mobility training;Therapeutic activities;Therapeutic exercise;Balance training    PT  Goals (Current goals can be found in the Care Plan section)  Acute Rehab PT Goals Patient Stated Goal: return home  PT Goal Formulation: With patient Time For Goal Achievement: 07/04/18 Potential to Achieve Goals: Good    Frequency Min 3X/week   Barriers to discharge        Co-evaluation               AM-PAC PT "6 Clicks" Daily Activity  Outcome Measure Difficulty turning over in bed (including adjusting bedclothes, sheets and blankets)?: None Difficulty moving from lying on back to sitting on the side of the bed? : None Difficulty sitting down on and standing up from a chair with arms (e.g., wheelchair, bedside commode, etc,.)?: None Help needed moving to and from a bed to chair (including a wheelchair)?: A Little Help needed walking in hospital room?: A Little Help needed climbing 3-5 steps with a railing? : A Little 6 Click Score: 21    End of Session Equipment Utilized During Treatment: Gait belt Activity Tolerance: Patient tolerated treatment well Patient left: in bed;with call bell/phone within reach;with family/visitor present Nurse Communication: Mobility status PT Visit Diagnosis: Unsteadiness on feet (R26.81);Other abnormalities of gait and mobility (R26.89);Muscle weakness (generalized) (M62.81);Difficulty in walking, not elsewhere classified (R26.2)    Time: 1025-8527 PT Time Calculation (min) (ACUTE ONLY): 29 min   Charges:   PT Evaluation $PT Eval Low Complexity: 1 Low PT Treatments $Gait Training: 8-22 mins   PT G Codes:        Etta Grandchild, PT, DPT Acute Rehab Services Pager: [phone]    Etta Grandchild 06/27/2018, 4:50 PM

## 2018-06-28 LAB — CBC
HCT: 44.9 % (ref 39.0–52.0)
Hemoglobin: 14.7 g/dL (ref 13.0–17.0)
MCH: 30.1 pg (ref 26.0–34.0)
MCHC: 32.7 g/dL (ref 30.0–36.0)
MCV: 92 fL (ref 78.0–100.0)
PLATELETS: 125 10*3/uL — AB (ref 150–400)
RBC: 4.88 MIL/uL (ref 4.22–5.81)
RDW: 15.4 % (ref 11.5–15.5)
WBC: 10.9 10*3/uL — AB (ref 4.0–10.5)

## 2018-06-28 LAB — BASIC METABOLIC PANEL
Anion gap: 8 (ref 5–15)
BUN: 39 mg/dL — ABNORMAL HIGH (ref 8–23)
CO2: 37 mmol/L — ABNORMAL HIGH (ref 22–32)
CREATININE: 2.07 mg/dL — AB (ref 0.61–1.24)
Calcium: 8.7 mg/dL — ABNORMAL LOW (ref 8.9–10.3)
Chloride: 86 mmol/L — ABNORMAL LOW (ref 98–111)
GFR calc Af Amer: 36 mL/min — ABNORMAL LOW (ref >60)
GFR, EST NON AFRICAN AMERICAN: 31 mL/min — AB (ref >60)
GLUCOSE: 139 mg/dL — AB (ref 70–99)
Potassium: 3.3 mmol/L — ABNORMAL LOW (ref 3.5–5.1)
Sodium: 131 mmol/L — ABNORMAL LOW (ref 135–145)

## 2018-06-28 MED ORDER — AMIODARONE HCL 200 MG PO TABS
400.0000 mg | ORAL_TABLET | Freq: Two times a day (BID) | ORAL | Status: DC
Start: 2018-06-28 — End: 2018-06-29
  Administered 2018-06-28 – 2018-06-29 (×3): 400 mg via ORAL
  Filled 2018-06-28 (×3): qty 2

## 2018-06-28 NOTE — Progress Notes (Signed)
Advanced Heart Failure Rounding Note  PCP-Cardiologist: No primary care provider on file.   Subjective:    Underwent DC-CV on 6/28 and 7/2  Remains on amiodarone at 30/hr. Maintaining NSR.    Seen by PT and recommended HHPT. No bleeding on Eliquis. No CP, orthopnea, PND or edema.   Objective:   Weight Range: 81.3 kg (179 lb 3.7 oz) Body mass index is 28.93 kg/m.   Vital Signs:   Temp:  [97.6 F (36.4 C)-98.2 F (36.8 C)] 98 F (36.7 C) (07/04 0741) Pulse Rate:  [59-70] 59 (07/04 0741) Resp:  [17-24] 23 (07/04 0741) BP: (103-117)/(53-94) 103/58 (07/04 0741) SpO2:  [96 %-100 %] 99 % (07/04 0741) Weight:  [81.3 kg (179 lb 3.7 oz)] 81.3 kg (179 lb 3.7 oz) (07/04 0310) Last BM Date: 06/27/18  Weight change: Filed Weights   06/26/18 0412 06/27/18 0408 06/28/18 0310  Weight: 70.1 kg (154 lb 9.6 oz) 81.6 kg (180 lb) 81.3 kg (179 lb 3.7 oz)    Intake/Output:   Intake/Output Summary (Last 24 hours) at 06/28/2018 1126 Last data filed at 06/28/2018 0640 Gross per 24 hour  Intake 550.52 ml  Output 650 ml  Net -99.48 ml      Physical Exam   General:  Lying in bed . No resp difficulty HEENT: normal x poor dentition Neck: supple. JVP 5. Carotids 2+ bilat; no bruits. No lymphadenopathy or thryomegaly appreciated. Cor: PMI nondisplaced. Regular rate & rhythm. No rubs, gallops or murmurs. Lungs: clear Abdomen: soft, nontender, nondistended. No hepatosplenomegaly. No bruits or masses. Good bowel sounds. Rectus sheath hematoma Extremities: no cyanosis, clubbing, rash, edema Neuro: alert & orientedx3, cranial nerves grossly intact. moves all 4 extremities w/o difficulty. Affect pleasant  Telemetry   NSR 50-60s personally reviewed.   Labs    CBC Recent Labs    06/27/18 0229 06/28/18 0314  WBC 15.3* 10.9*  HGB 14.1 14.7  HCT 42.8 44.9  MCV 89.5 92.0  PLT 96* 125*   Basic Metabolic Panel Recent Labs    84/66/59 0452 06/27/18 0229 06/28/18 0314  NA 127* 128*  131*  K 3.6 3.5 3.3*  CL 83* 83* 86*  CO2 36* 34* 37*  GLUCOSE 125* 124* 139*  BUN 54* 47* 39*  CREATININE 2.40* 2.20* 2.07*  CALCIUM 8.4* 8.4* 8.7*  MG 2.0  --   --    Liver Function Tests No results for input(s): AST, ALT, ALKPHOS, BILITOT, PROT, ALBUMIN in the last 72 hours. No results for input(s): LIPASE, AMYLASE in the last 72 hours. Cardiac Enzymes No results for input(s): CKTOTAL, CKMB, CKMBINDEX, TROPONINI in the last 72 hours.  BNP: BNP (last 3 results) Recent Labs    06/19/18 2128  BNP 1,948.0*    ProBNP (last 3 results) No results for input(s): PROBNP in the last 8760 hours.   D-Dimer No results for input(s): DDIMER in the last 72 hours. Hemoglobin A1C No results for input(s): HGBA1C in the last 72 hours. Fasting Lipid Panel No results for input(s): CHOL, HDL, LDLCALC, TRIG, CHOLHDL, LDLDIRECT in the last 72 hours. Thyroid Function Tests No results for input(s): TSH, T4TOTAL, T3FREE, THYROIDAB in the last 72 hours.  Invalid input(s): FREET3  Other results:   Imaging    No results found.   Medications:     Scheduled Medications: . sodium chloride   Intravenous Once  . apixaban  5 mg Oral BID  . Chlorhexidine Gluconate Cloth  6 each Topical Daily  . furosemide  40  mg Oral Daily  . multivitamin with minerals  1 tablet Oral Daily  . sodium chloride flush  10-40 mL Intracatheter Q12H  . sodium chloride flush  3 mL Intravenous Q12H  . sodium chloride flush  3 mL Intravenous Q12H    Infusions: . sodium chloride    . sodium chloride    . sodium chloride 10 mL/hr at 06/26/18 0950  . amiodarone 30 mg/hr (06/28/18 0235)  . norepinephrine (LEVOPHED) Adult infusion 0 mcg/min (06/21/18 0115)    PRN Medications: levalbuterol, ondansetron (ZOFRAN) IV, sodium chloride flush, sodium chloride flush, sodium chloride flush    Patient Profile    Martin [name] is a 72 yo with PAF/FL, systolic HF due to NICM, HTN,previous rectus sheath hematoma who was  transferred from [facility] due to cardiogenic shock in setting of recurrent AF.    Assessment/Plan   1. Cardiogenic shock, recurrent - EF 20-25% by Echo at [facility] 6/19 - Likely NICM due to tachy-induced CM which is recurrent for him  - Now off milrinone and NE.  -  Renal function continues to improve.   - Back on lasix. Weight stable -  No ACE/ARB with AKI. No blocker with shock.   2. AKI - Due to #1 - Improving with hemodynamic support. Creatinine peaked 4.6 .  Todays creatinine 2.07   3. Acute respiratory failure - Improved. Now on room air sats stable.   4. Paroxysmal AFL/atrial tach/RVR  -Now s/p DC-CV on 6/28 and 7/2 - Maintaining NSR. On IV amio. Will switch to 400 po bid - Remains on eliquis 5 mg twice a day.  - Discussed need for AVN ablation and biv pacing to prevent recurrent near-death epsidoe like these but both he and his wife refuse   5. Moderate right pleural effusion with elevated PCT - no frank symptoms of infection but CT appearance concerning and PCT is up - On cefipime. Cx remains negative. PCT coming down - CCM attempted thoracentesis 6/26  but no fluid left on u/s  6. Rectus sheath hematoma - chronic. No change  7. Pulmonary embolus - Possible small RLL PE (equivocal on CT)  Will need AC for AF anyway   8. Dispo - Seen by PT - Possibly home tomorrow if maintains NSR on po amio  Length of Stay: 9  [name], MD  06/28/2018, 11:26 AM  Advanced Heart Failure Team Pager [phone] (M-F; 7a - 4p)  Please contact [facility] for night-coverage after hours (4p -7a ) and weekends on [email or web address]

## 2018-06-29 LAB — BASIC METABOLIC PANEL
ANION GAP: 9 (ref 5–15)
ANION GAP: 11 (ref 5–15)
BUN: 42 mg/dL — AB (ref 8–23)
BUN: 37 mg/dL — AB (ref 8–23)
CHLORIDE: 88 mmol/L — AB (ref 98–111)
CHLORIDE: 91 mmol/L — AB (ref 98–111)
CO2: 34 mmol/L — ABNORMAL HIGH (ref 22–32)
CO2: 32 mmol/L (ref 22–32)
Calcium: 8.4 mg/dL — ABNORMAL LOW (ref 8.9–10.3)
Calcium: 8.8 mg/dL — ABNORMAL LOW (ref 8.9–10.3)
Creatinine, Ser: 1.97 mg/dL — ABNORMAL HIGH (ref 0.61–1.24)
Creatinine, Ser: 1.92 mg/dL — ABNORMAL HIGH (ref 0.61–1.24)
GFR calc Af Amer: 38 mL/min — ABNORMAL LOW (ref >60)
GFR calc Af Amer: 39 mL/min — ABNORMAL LOW (ref >60)
GFR calc non Af Amer: 33 mL/min — ABNORMAL LOW (ref >60)
GFR, EST NON AFRICAN AMERICAN: 34 mL/min — AB (ref >60)
GLUCOSE: 107 mg/dL — AB (ref 70–99)
GLUCOSE: 98 mg/dL (ref 70–99)
POTASSIUM: 2.9 mmol/L — AB (ref 3.5–5.1)
POTASSIUM: 3.9 mmol/L (ref 3.5–5.1)
SODIUM: 131 mmol/L — AB (ref 135–145)
Sodium: 134 mmol/L — ABNORMAL LOW (ref 135–145)

## 2018-06-29 LAB — CBC
HCT: 42.3 % (ref 39.0–52.0)
HEMOGLOBIN: 13.8 g/dL (ref 13.0–17.0)
MCH: 29.6 pg (ref 26.0–34.0)
MCHC: 32.6 g/dL (ref 30.0–36.0)
MCV: 90.6 fL (ref 78.0–100.0)
PLATELETS: 148 10*3/uL — AB (ref 150–400)
RBC: 4.67 MIL/uL (ref 4.22–5.81)
RDW: 15.2 % (ref 11.5–15.5)
WBC: 11.6 10*3/uL — ABNORMAL HIGH (ref 4.0–10.5)

## 2018-06-29 MED ORDER — POTASSIUM CHLORIDE ER 10 MEQ PO TBCR: 20.0000 meq | EXTENDED_RELEASE_TABLET | Freq: Every day | ORAL | 6 refills | Status: AC

## 2018-06-29 MED ORDER — AMIODARONE HCL 200 MG PO TABS: ORAL_TABLET | ORAL | 1 refills | Status: DC

## 2018-06-29 MED ORDER — POTASSIUM CHLORIDE CRYS ER 20 MEQ PO TBCR
40.0000 meq | EXTENDED_RELEASE_TABLET | Freq: Once | ORAL | Status: AC
Start: 2018-06-29 — End: 2018-06-29
  Administered 2018-06-29: 40 meq via ORAL
  Filled 2018-06-29: qty 2

## 2018-06-29 MED ORDER — LOSARTAN POTASSIUM 25 MG PO TABS: 25.0000 mg | ORAL_TABLET | Freq: Every day | ORAL | 6 refills | Status: AC

## 2018-06-29 MED ORDER — POTASSIUM CHLORIDE CRYS ER 20 MEQ PO TBCR
40.0000 meq | EXTENDED_RELEASE_TABLET | Freq: Once | ORAL | Status: AC
  Administered 2018-06-29: 40 meq via ORAL
  Filled 2018-06-29: qty 2

## 2018-06-29 MED ORDER — APIXABAN 5 MG PO TABS: 5.0000 mg | ORAL_TABLET | Freq: Two times a day (BID) | ORAL | 6 refills | Status: DC

## 2018-06-29 MED ORDER — LOSARTAN POTASSIUM 25 MG PO TABS
25.0000 mg | ORAL_TABLET | Freq: Every day | ORAL | Status: DC
  Administered 2018-06-29: 25 mg via ORAL
  Filled 2018-06-29: qty 1

## 2018-06-29 MED ORDER — FUROSEMIDE 40 MG PO TABS: 40.0000 mg | ORAL_TABLET | Freq: Every day | ORAL | 6 refills | Status: AC

## 2018-06-29 MED ORDER — POTASSIUM CHLORIDE CRYS ER 20 MEQ PO TBCR: 40.0000 meq | EXTENDED_RELEASE_TABLET | Freq: Two times a day (BID) | ORAL | Status: DC

## 2018-06-29 NOTE — Progress Notes (Signed)
Patient discharged to home, all discharge instructions discussed at length including medications and upcoming appointments with patient and spouse.  All patient belongs sent home with patient.

## 2018-06-29 NOTE — Progress Notes (Signed)
Physical Therapy Treatment Patient Details Name: Martin [name] MRN: [id number] DOB: 11/19/1946 Today's Date: 06/29/2018    History of Present Illness Rameses Quinlin is a 72 y.o. male with medical history significant of CHF with EF of 15%, atrial fibrillation not taking AC, hyperlipidemia,, OSA, CKD 3, rectal sheath hematoma, right sided abdominal mass due to hx of bleeding 2/2 blood thinner use, who was transferred from [facility] due to SOB. Now s/p DC-CV on 6/28 and 7/2    PT Comments    Pt progressing well with therapy today. Ambulating unit without assistive device, no overt LOB but unsteadiness noted. Session also focused on stair training, able to perform without assistance and demonstrate safety. Reinforced exercise recommendations with pt in light of his refusal for HHPT. No concerns for safe return home once medically ready. VSS.  HR ranged 70-93 during visit BP after activity 143/78 SpO2 after activity 97% on RA     Follow Up Recommendations  Home health PT;Other (comment);Supervision for mobility/OOB(pt refusing HHPT)     Equipment Recommendations  Rolling walker with 5" wheels    Recommendations for Other Services       Precautions / Restrictions Precautions Precautions: Fall Restrictions Weight Bearing Restrictions: No    Mobility  Bed Mobility Overal bed mobility: Modified Independent                Transfers Overall transfer level: Modified independent Equipment used: None                Ambulation/Gait Ambulation/Gait assistance: Supervision Gait Distance (Feet): 300 Feet Assistive device: None Gait Pattern/deviations: Step-to pattern;Step-through pattern Gait velocity: decreased   General Gait Details: ambulating on RA without AD, no overt LOB mild unsteadiness noted.    Stairs Stairs: Yes Stairs assistance: Supervision;Modified independent (Device/Increase time) Stair Management: One rail Right;Alternating  pattern Number of Stairs: 8 General stair comments: pt ambulating stairs without assistance, alternating pattern, use of 1 rail for balalnce.    Wheelchair Mobility    Modified Rankin (Stroke Patients Only)       Balance Overall balance assessment: Mild deficits observed, not formally tested                                          Cognition Arousal/Alertness: Awake/alert Behavior During Therapy: WFL for tasks assessed/performed Overall Cognitive Status: Within Functional Limits for tasks assessed                                        Exercises      General Comments        Pertinent Vitals/Pain Pain Assessment: No/denies pain    Home Living                      Prior Function            PT Goals (current goals can now be found in the care plan section) Acute Rehab PT Goals Patient Stated Goal: return home  PT Goal Formulation: With patient Time For Goal Achievement: 07/04/18 Potential to Achieve Goals: Good Progress towards PT goals: Progressing toward goals    Frequency    Min 3X/week      PT Plan Current plan remains appropriate    Co-evaluation  AM-PAC PT "6 Clicks" Daily Activity  Outcome Measure  Difficulty turning over in bed (including adjusting bedclothes, sheets and blankets)?: None Difficulty moving from lying on back to sitting on the side of the bed? : None Difficulty sitting down on and standing up from a chair with arms (e.g., wheelchair, bedside commode, etc,.)?: None Help needed moving to and from a bed to chair (including a wheelchair)?: A Little Help needed walking in hospital room?: A Little Help needed climbing 3-5 steps with a railing? : A Little 6 Click Score: 21    End of Session Equipment Utilized During Treatment: Gait belt Activity Tolerance: Patient tolerated treatment well Patient left: in chair;with call bell/phone within reach Nurse Communication:  Mobility status PT Visit Diagnosis: Unsteadiness on feet (R26.81);Other abnormalities of gait and mobility (R26.89);Muscle weakness (generalized) (M62.81);Difficulty in walking, not elsewhere classified (R26.2)     Time: 1610-9604 PT Time Calculation (min) (ACUTE ONLY): 15 min  Charges:  $Gait Training: 8-22 mins                    G Codes:       Etta Grandchild, PT, DPT Acute Rehab Services Pager: [phone]     Etta Grandchild 06/29/2018, 9:29 AM

## 2018-06-29 NOTE — Plan of Care (Signed)
Patient tolerating PO amio, redness noted to chest. Back, upper arms no rasied rash or itching per patient.  Patient currently remains in NSR, OOB to sink for AM care with no difficulties.

## 2018-06-29 NOTE — Discharge Summary (Addendum)
Advanced Heart Failure Discharge Note  Discharge Summary   Patient ID: Martin [name] MRN: [id number], DOB/AGE: [birth year] 72 y.o. Admit date: 06/19/2018 D/C date:     06/29/2018   Primary Discharge Diagnoses:  1. Acute on chronic systolic HF -> Cardiogenic shock, recurrent - Required milrinone, norepi in setting of atrial flutter RVR 2. AKI 3. Acute respiratory failure 4. Paroxysmal AFL/atrial tach RVR - s/p DCCV 06/22/18, 06/25/18 - Amio 400 mg BID through 7/18, then 200 mg BID afterward 5. Moderate right pleural effusion with elevated PCT 6. Rectus sheath hematoma 7. Pulmonary embolus 8. Hypokalemia 9. Community-acquired PNA  Hospital Course: Martin [name] is a 72 yo with PAF/FL, systolic HF due to NICM, HTN, previous rectus sheath hematoma.  He was transferred from [facility] due to cardiogenic shock in setting of recurrent AF after stopping medications for 6 months. Echo at [facility] showed EF 20-25% on 6/19. He required milrinone, norepinephrine, and lasix drip for diuresis. Initial co-ox 40%, but improved with inotrope support. Milrinone and norepinephrine were weaned as tolerated. He was transitioned back to lasix 40 mg daily. HF medications optimized as able. BB was held due to recent shock.  Underwent DCCV on 06/22/18 and 06/26/18 for atrial flutter RVR. He required amiodarone drip and was transitioned to PO amio. He maintained NSR on PO amiodarone. He was transitioned from heparin drip to Apixiban and given Apixiban card. Dr Gala Romney discussed the need for AVN ablation and BiV pacing to avoid recurrent cardiogenic shock, but pt and wife refused.   He had AKI in setting of cardiogenic shock with creatinine peak at 4.6. He was also hyperkalemic on admission and required kayexelate. Creatinine improved with hemodynamic support. BMET was monitored. Creatinine improved to 1.9 by day of discharge. He will need a BMET at follow up.   He had acute respiratory failure on admission,  which improved with BiPAP and diuresis. He was weaned to room air. He was noted to have a moderate right pleural effusion on CT. CCM attempted to do thoracentesis, but no fluid was left on [place]. He completed a course of Cefepime due to this and elevated PCT. He had no other s/s infection. Blood cultures were negative. Repeat CXR showed improvement in effusion.  A possible small RLL PE was noted on CT scan. Dr Gala Romney reviewed and did not think PE was present. He will remain on Apixiban for atrial flutter regardless.  PT recommended HH PT and walker, but pt refused.  Pt refused follow up in HF clinic. Follow up was scheduled for him at [facility] in [place], as below. He will need a BMET and EKG and follow up. He will also need a repeat Echo in the future. He has been referred to cardiac rehab.   Discharge Weight Range: 176 lbs.  Discharge Vitals: Blood pressure 109/90, pulse 67, temperature 97.7 F (36.5 C), temperature source Oral, resp. rate 16, height 5\' 6"  (1.676 m), weight 176 lb 8 oz (80.1 kg), SpO2 98 %.  Labs: Lab Results  Component Value Date   WBC 11.6 (H) 06/29/2018   HGB 13.8 06/29/2018   HCT 42.3 06/29/2018   MCV 90.6 06/29/2018   PLT 148 (L) 06/29/2018    Recent Labs  Lab 06/29/18 1134  NA 134*  K 3.9  CL 91*  CO2 32  BUN 37*  CREATININE 1.92*  CALCIUM 8.8*  GLUCOSE 98   Lab Results  Component Value Date   CHOL 178 06/20/2018   HDL 30 (L) 06/20/2018   LDLCALC  121 (H) 06/20/2018   TRIG 136 06/20/2018   BNP (last 3 results) Recent Labs    06/19/18 2128  BNP 1,948.0*    ProBNP (last 3 results) No results for input(s): PROBNP in the last 8760 hours.   Diagnostic Studies/Procedures   BLE [place] 06/20/18: Right: There is no evidence of deep vein thrombosis in the lower extremity. No cystic structure found in the popliteal fossa. Left: There is no evidence of deep vein thrombosis in the lower extremity. No cystic structure found in the popliteal fossa.  ABD [place]  06/20/18: 14.6 cm right rectus sheath hematoma, previously 17.5 cm. Cholelithiasis, without associated sonographic findings to suggest acute cholecystitis. Suspected mild hepatic steatosis. Bilateral renal atrophy.  No hydronephrosis.  Discharge Medications   Allergies as of 06/29/2018      Reactions   Amiodarone Rash   Rash resolved when amio stopped 2013, retried in 2017 with same reaction      Medication List    STOP taking these medications   carvedilol 3.125 MG tablet Commonly known as:  COREG     TAKE these medications   amiodarone 200 MG tablet Commonly known as:  PACERONE Take 400 mg (2 tabs) twice daily through 7/18, then take 200 mg (1 tab) twice daily.   apixaban 5 MG Tabs tablet Commonly known as:  ELIQUIS Take 1 tablet (5 mg total) by mouth 2 (two) times daily.   atorvastatin 80 MG tablet Commonly known as:  LIPITOR Take 1 tablet (80 mg total) by mouth daily at 6 PM.   furosemide 40 MG tablet Commonly known as:  LASIX Take 1 tablet (40 mg total) by mouth daily. Take an extra 40mg  (1 tablet) for weight 190lbs or greater   losartan 25 MG tablet Commonly known as:  COZAAR Take 1 tablet (25 mg total) by mouth at bedtime. What changed:  how much to take   multivitamin with minerals Tabs tablet Take 1 tablet by mouth daily.   potassium chloride 10 MEQ tablet Commonly known as:  K-DUR Take 2 tablets (20 mEq total) by mouth daily. Start taking on:  06/30/2018       Disposition   The patient will be discharged in stable condition to home. Discharge Instructions    (HEART FAILURE PATIENTS) Call MD:  Anytime you have any of the following symptoms: 1) 3 pound weight gain in 24 hours or 5 pounds in 1 week 2) shortness of breath, with or without a dry hacking cough 3) swelling in the hands, feet or stomach 4) if you have to sleep on extra pillows at night in order to breathe.   Complete by:  As directed    Amb Referral to Cardiac Rehabilitation   Complete by:   As directed    Diagnosis:  Heart Failure (see criteria below if ordering Phase II)   Heart Failure Type:  Chronic Systolic & Diastolic   Diet - low sodium heart healthy   Complete by:  As directed    Heart Failure patients record your daily weight using the same scale at the same time of day   Complete by:  As directed    Increase activity slowly   Complete by:  As directed      Follow-up Information    [facility] Follow up on 07/12/2018.   Specialty:  Cardiology Why:  9:20 am with Dr Belva Chimes information: [address] [place] [address] [phone]  Duration of Discharge Encounter: Greater than 35 minutes   Signed, Alford Highland, NP 06/29/2018, 4:19 PM  Patient seen and examined with the above-signed Advanced Practice Provider and/or Housestaff. I personally reviewed laboratory data, imaging studies and relevant notes. I independently examined the patient and formulated the important aspects of the plan. I have edited the note to reflect any of my changes or salient points. I have personally discussed the plan with the patient and/or family.  Looks quite good. Maintaining NSR. Continue amio 400 bid for 2 weeks then decrease to 200 bid, Supp K prior to d/c. Very anxious to go home. Refuses f/u in HF Clinic wants to f/u in [place].   Discussed need for AVN ablation and biv pacing to prevent recurrent near-death epsidoe like these but both he and his wife refuse   [name], MD  1:22 PM

## 2018-06-29 NOTE — Progress Notes (Addendum)
Advanced Heart Failure Rounding Note  PCP-Cardiologist: No primary care provider on file.   Subjective:    Underwent DC-CV on 6/28 and 7/2  Maintaining NSR on PO amiodarone.     Seen by PT and recommended HHPT, but pt refusing. Denies bleeding, CP, orthopnea, or SOB. Very anxious to go home. Refuses f/u in HF Clinic wants to f/u in [place].   Objective:   Weight Range: 176 lb 8 oz (80.1 kg) Body mass index is 28.49 kg/m.   Vital Signs:   Temp:  [97.7 F (36.5 C)-98.3 F (36.8 C)] 98.3 F (36.8 C) (07/05 0300) Pulse Rate:  [62-78] 67 (07/05 0300) Resp:  [16-25] 16 (07/05 0300) BP: (102-107)/(52-73) 105/63 (07/05 0300) SpO2:  [94 %-99 %] 94 % (07/05 0300) Weight:  [176 lb 8 oz (80.1 kg)] 176 lb 8 oz (80.1 kg) (07/05 0300) Last BM Date: 06/27/18  Weight change: Filed Weights   06/27/18 0408 06/28/18 0310 06/29/18 0300  Weight: 180 lb (81.6 kg) 179 lb 3.7 oz (81.3 kg) 176 lb 8 oz (80.1 kg)    Intake/Output:   Intake/Output Summary (Last 24 hours) at 06/29/2018 0748 Last data filed at 06/29/2018 0500 Gross per 24 hour  Intake 605 ml  Output 1575 ml  Net -970 ml      Physical Exam   General: No resp difficulty. HEENT: Normal x poor dentition anicteric  Neck: Supple. JVP 5-6. Carotids 2+ bilat; no bruits. No thyromegaly or nodule noted. Cor: PMI nondisplaced. RRR, No M/G/R noted Lungs: CTAB, normal effort. No wheeze Abdomen: Soft, non-tender, non-distended, no HSM. No bruits or masses. +BS  Extremities: No cyanosis, clubbing, or rash. R and LLE trace ankle edema Neuro: Alert & orientedx3, cranial nerves grossly intact. moves all 4 extremities w/o difficulty. Affect pleasant  Telemetry   NSR 60-70s. Personally reviewed.   Labs    CBC Recent Labs    06/28/18 0314 06/29/18 0241  WBC 10.9* 11.6*  HGB 14.7 13.8  HCT 44.9 42.3  MCV 92.0 90.6  PLT 125* 148*   Basic Metabolic Panel Recent Labs    16/10/96 0314 06/29/18 0241  NA 131* 131*  K  3.3* 2.9*  CL 86* 88*  CO2 37* 34*  GLUCOSE 139* 107*  BUN 39* 42*  CREATININE 2.07* 1.97*  CALCIUM 8.7* 8.4*   Liver Function Tests No results for input(s): AST, ALT, ALKPHOS, BILITOT, PROT, ALBUMIN in the last 72 hours. No results for input(s): LIPASE, AMYLASE in the last 72 hours. Cardiac Enzymes No results for input(s): CKTOTAL, CKMB, CKMBINDEX, TROPONINI in the last 72 hours.  BNP: BNP (last 3 results) Recent Labs    06/19/18 2128  BNP 1,948.0*    ProBNP (last 3 results) No results for input(s): PROBNP in the last 8760 hours.   D-Dimer No results for input(s): DDIMER in the last 72 hours. Hemoglobin A1C No results for input(s): HGBA1C in the last 72 hours. Fasting Lipid Panel No results for input(s): CHOL, HDL, LDLCALC, TRIG, CHOLHDL, LDLDIRECT in the last 72 hours. Thyroid Function Tests No results for input(s): TSH, T4TOTAL, T3FREE, THYROIDAB in the last 72 hours.  Invalid input(s): FREET3  Other results:   Imaging    No results found.   Medications:     Scheduled Medications: . sodium chloride   Intravenous Once  . amiodarone  400 mg Oral BID  . apixaban  5 mg Oral BID  . Chlorhexidine Gluconate Cloth  6 each Topical Daily  . furosemide  40  mg Oral Daily  . multivitamin with minerals  1 tablet Oral Daily  . sodium chloride flush  10-40 mL Intracatheter Q12H  . sodium chloride flush  3 mL Intravenous Q12H  . sodium chloride flush  3 mL Intravenous Q12H    Infusions: . sodium chloride    . sodium chloride    . sodium chloride 10 mL/hr at 06/26/18 0950  . norepinephrine (LEVOPHED) Adult infusion 0 mcg/min (06/21/18 0115)    PRN Medications: levalbuterol, ondansetron (ZOFRAN) IV, sodium chloride flush, sodium chloride flush, sodium chloride flush    Patient Profile    Mr. Corkill is a 72 yo with PAF/FL, systolic HF due to NICM, HTN,previous rectus sheath hematoma who was transferred from [facility] due to cardiogenic shock in  setting of recurrent AF.    Assessment/Plan   1. Cardiogenic shock, recurrent - EF 20-25% by Echo at [facility] 6/19 - Likely NICM due to tachy-induced CM which is recurrent for him  - Now off milrinone and NE.  -  Renal function continues to improve.   - Back on lasix 40 mg daily. Weights trending down. -  No ACE/ARB with AKI. No blocker with shock.   2. AKI - Due to #1 - Improving with hemodynamic support. Creatinine peaked 4.6 .  - Continues to improve. 1.97 this am.   3. Acute respiratory failure - Improved. Now on room air sats stable. No change.   4. Paroxysmal AFL/atrial tach/RVR  -Now s/p DC-CV on 6/28 and 7/2 - Maintaining NSR. Continue amio 400 po bid - Continue eliquis 5 mg twice a day.  - Discussed need for AVN ablation and biv pacing to prevent recurrent near-death epsidoe like these but both he and his wife refuse   5. Moderate right pleural effusion with elevated PCT - no frank symptoms of infection but CT appearance concerning and PCT is up - On cefipime. Cx remains negative. PCT coming down - CCM attempted thoracentesis 6/26  but no fluid left on u/s. No change.   6. Rectus sheath hematoma - chronic. No change.   7. Pulmonary embolus - Possible small RLL PE (equivocal on CT)  Will need AC for AF anyway. Continue eliquis.   8. Dispo - Seen by PT - recommending HH PT and rolling walker, but he refuses.  - Possibly home today.   9. Hypokalemia - K 2.9 this am. Already supped. Recheck BMET at 13:00.   He lives in [place] and would like to follow up there if possible.   Length of Stay: 10  Alford Highland, NP  06/29/2018, 7:48 AM  Advanced Heart Failure Team Pager [phone] (M-F; 7a - 4p)  Please contact [facility] for night-coverage after hours (4p -7a ) and weekends on [email or web address]  Patient seen and examined with the above-signed Advanced Practice Provider and/or Housestaff. I personally reviewed laboratory data, imaging studies and  relevant notes. I independently examined the patient and formulated the important aspects of the plan. I have edited the note to reflect any of my changes or salient points. I have personally discussed the plan with the patient and/or family.  Looks quite good. Maintaining NSR. Continue amio 400 bid for 2 weeks then decrease to 200 bid, Supp K prior to d/c. Very anxious to go home. Refuses f/u in HF Clinic wants to f/u in [place].   Discussed need for AVN ablation and biv pacing to prevent recurrent near-death epsidoe like these but both he and his wife refuse   [name], MD  1:22 PM

## 2018-06-29 NOTE — Progress Notes (Signed)
CARDIAC REHAB PHASE I   Offered to walk with pt. Pt stated he just walked with PT and his heart stayed in rhythm. Pt states next time he walks, "it will be out the door". Reviewed importance of daily weights and 2,000mg  sodium diet with pt and wife. Encouraged pt to continue walks and increase activity slowly. Pt referred to CRP II [place].   [id number] Reynold Bowen, RN BSN 06/29/2018 11:09 AM

## 2018-07-11 ENCOUNTER — Encounter (HOSPITAL_COMMUNITY): Payer: Medicare HMO

## 2018-07-12 ENCOUNTER — Encounter: Payer: Self-pay | Admitting: Cardiology

## 2018-07-12 ENCOUNTER — Other Ambulatory Visit (HOSPITAL_COMMUNITY)
Admission: RE | Admit: 2018-07-12 | Discharge: 2018-07-12 | Disposition: A | Discharge status: Home / Self Care | Payer: Medicare HMO | Source: Ambulatory Visit | Attending: Cardiology | Admitting: Cardiology

## 2018-07-12 ENCOUNTER — Ambulatory Visit (INDEPENDENT_AMBULATORY_CARE_PROVIDER_SITE_OTHER): Payer: Medicare HMO | Admitting: Cardiology

## 2018-07-12 VITALS — BP 128/72 | HR 74 | Ht 65.0 in | Wt 184.0 lb

## 2018-07-12 DIAGNOSIS — I5022 Chronic systolic (congestive) heart failure: Secondary | ICD-10-CM

## 2018-07-12 DIAGNOSIS — I4892 Unspecified atrial flutter: Secondary | ICD-10-CM

## 2018-07-12 DIAGNOSIS — I480 Paroxysmal atrial fibrillation: Secondary | ICD-10-CM | POA: Diagnosis not present

## 2018-07-12 DIAGNOSIS — N179 Acute kidney failure, unspecified: Secondary | ICD-10-CM | POA: Diagnosis not present

## 2018-07-12 LAB — BASIC METABOLIC PANEL
ANION GAP: 8 (ref 5–15)
BUN: 19 mg/dL (ref 8–23)
CALCIUM: 8.8 mg/dL — AB (ref 8.9–10.3)
CO2: 27 mmol/L (ref 22–32)
Chloride: 104 mmol/L (ref 98–111)
Creatinine, Ser: 1.35 mg/dL — ABNORMAL HIGH (ref 0.61–1.24)
GFR calc Af Amer: >60 mL/min (ref >60)
GFR, EST NON AFRICAN AMERICAN: 52 mL/min — AB (ref >60)
Glucose, Bld: 95 mg/dL (ref 70–99)
POTASSIUM: 4.3 mmol/L (ref 3.5–5.1)
SODIUM: 139 mmol/L (ref 135–145)

## 2018-07-12 LAB — MAGNESIUM: MAGNESIUM: 2 mg/dL (ref 1.7–2.4)

## 2018-07-12 MED ORDER — AMIODARONE HCL 200 MG PO TABS: 200.0000 mg | ORAL_TABLET | Freq: Two times a day (BID) | ORAL | 3 refills | Status: DC

## 2018-07-12 NOTE — Patient Instructions (Signed)
Medication Instructions:  Decrease amiodarone to 200 mg two times daily   Labwork: Today bmet magnesium  Testing/Procedures: none  Follow-Up: Your physician recommends that you schedule a follow-up appointment in: 3-4 weeks    Any Other Special Instructions Will Be Listed Below (If Applicable).     If you need a refill on your cardiac medications before your next appointment, please call your pharmacy.

## 2018-07-12 NOTE — Progress Notes (Signed)
Clinical Summary Martin [name] is a 72 y.o.male seen today for follow up of the following medical problems.     1. Chronic systolic HF - recent admission 06/2018 with cardiogenic shock requiring milrionone and levo drip - presented after stopping her home meds x 6 months - echo 05/2018 [facility] LVEF 20-25% - no beta blocker due to low CO. She refused to f/u in CHF clinic - poor renal function, poor cath candidate  - discharge weight 176 lbs.   - no recent SOB/DOE. No LE edema. Home weights 183 lbs - compliant with meds.  - limiting sodium intake. Avoiding NSAIDs  2. Aflutter - s/p DCCV on 06/22/18 and 06/25/18 - started on amio 400mg  bid, plans to do 200mg  bid starting 07/12/18 - from notes discussions about av nodal ablation with plans for BiV pacing but patient refused  - no recent palpitations. No bleeding on eliquis - EKG today shows SR.   3. AKI - Cr up to 4.6 in setting of cardiogenic shock during recent admission - has been improving over time.    4. Questionable PE - question about PE on CT scan during recent admission - on anticoag for aflutter regardless    5. Chronic rectus sheath hematoma    Past Medical History:  Diagnosis Date  . Acute blood loss anemia    Due to rectus sheath hematoma 03/2012  . Acute renal failure (HCC)    03/2012 (not on ACEI due to this) - renal US unremarkable  . Atrial fibrillation (HCC)   . Cardiomyopathy (HCC)    Transient cardiomyopathy with EF 30-35% 03/27/12, improved to 55-60% 04/16/12  . CHF (congestive heart failure) (HCC)   . Essential hypertension, benign   . Hematuria 03/2012  . Hyperglycemia 03/2012  . Pleural effusion 03/2012  . Rectus sheath hematoma    Spontaneous during 03/2012 hospitalization managed conservatively with blood products  . Respiratory failure (HCC)    VDRF 03/2012 in setting of CHF  . Shock (HCC)    03/2012 hospitalization - Cardiogenic shock requiring pressors initially, followed by hemorrhagic  shock later in hospitalization  . Unspecified sleep apnea    Suspected during 03/2012 hospitalization      Allergies  Allergen Reactions  . Amiodarone Rash    Rash resolved when amio stopped 2013, retried in 2017 with same reaction     Current Outpatient Medications  Medication Sig Dispense Refill  . amiodarone (PACERONE) 200 MG tablet Take 400 mg (2 tabs) twice daily through 7/18, then take 200 mg (1 tab) twice daily. 75 tablet 1  . apixaban (ELIQUIS) 5 MG TABS tablet Take 1 tablet (5 mg total) by mouth 2 (two) times daily. 60 tablet 6  . atorvastatin (LIPITOR) 80 MG tablet Take 1 tablet (80 mg total) by mouth daily at 6 PM. 30 tablet 6  . furosemide (LASIX) 40 MG tablet Take 1 tablet (40 mg total) by mouth daily. Take an extra 40mg  (1 tablet) for weight 190lbs or greater 30 tablet 6  . losartan (COZAAR) 25 MG tablet Take 1 tablet (25 mg total) by mouth at bedtime. 30 tablet 6  . Multiple Vitamin (MULTIVITAMIN WITH MINERALS) TABS tablet Take 1 tablet by mouth daily.    . potassium chloride (K-DUR) 10 MEQ tablet Take 2 tablets (20 mEq total) by mouth daily. 60 tablet 6   No current facility-administered medications for this visit.      Past Surgical History:  Procedure Laterality Date  . CARDIOVERSION N/A  03/05/2015   Procedure: CARDIOVERSION;  Surgeon: Dolores Patty, MD;  Location: [facility] ENDOSCOPY;  Service: Cardiovascular;  Laterality: N/A;  . CARDIOVERSION N/A 05/20/2016   Procedure: CARDIOVERSION;  Surgeon: Dolores Patty, MD;  Location: [facility] OR;  Service: Cardiovascular;  Laterality: N/A;  . CARDIOVERSION N/A 05/25/2016   Procedure: CARDIOVERSION;  Surgeon: Vesta Mixer, MD;  Location: [facility] ENDOSCOPY;  Service: Cardiovascular;  Laterality: N/A;  . CARDIOVERSION N/A 02/03/2017   Procedure: CARDIOVERSION;  Surgeon: Dolores Patty, MD;  Location: [facility] ENDOSCOPY;  Service: Cardiovascular;  Laterality: N/A;  . CARDIOVERSION N/A 06/22/2018   Procedure: CARDIOVERSION;  Surgeon:  Dolores Patty, MD;  Location: [facility] ENDOSCOPY;  Service: Cardiovascular;  Laterality: N/A;  . CARDIOVERSION N/A 06/26/2018   Procedure: CARDIOVERSION;  Surgeon: Dolores Patty, MD;  Location: [facility] ENDOSCOPY;  Service: Cardiovascular;  Laterality: N/A;  . TEE WITHOUT CARDIOVERSION N/A 03/05/2015   Procedure: TRANSESOPHAGEAL ECHOCARDIOGRAM (TEE);  Surgeon: Dolores Patty, MD;  Location: [facility] ENDOSCOPY;  Service: Cardiovascular;  Laterality: N/A;  . TEE WITHOUT CARDIOVERSION N/A 05/20/2016   Procedure: TRANSESOPHAGEAL ECHOCARDIOGRAM (TEE);  Surgeon: Dolores Patty, MD;  Location: [facility] OR;  Service: Cardiovascular;  Laterality: N/A;  . TEE WITHOUT CARDIOVERSION N/A 02/03/2017   Procedure: TRANSESOPHAGEAL ECHOCARDIOGRAM (TEE);  Surgeon: Dolores Patty, MD;  Location: [facility] ENDOSCOPY;  Service: Cardiovascular;  Laterality: N/A;     Allergies  Allergen Reactions  . Amiodarone Rash    Rash resolved when amio stopped 2013, retried in 2017 with same reaction      Family History  Problem Relation Age of Onset  . Emphysema Father   . Heart disease Father   . Other Mother        died during a leg surgery  . Diabetes Brother   . Heart attack Brother      Social History Mr. Patras reports that he has never smoked. He has never used smokeless tobacco. Mr. Mcevoy reports that he drinks alcohol.   Review of Systems CONSTITUTIONAL: No weight loss, fever, chills, weakness or fatigue.  HEENT: Eyes: No visual loss, blurred vision, double vision or yellow sclerae.No hearing loss, sneezing, congestion, runny nose or sore throat.  SKIN: No rash or itching.  CARDIOVASCULAR: per hpi RESPIRATORY:per hpi GASTROINTESTINAL: No anorexia, nausea, vomiting or diarrhea. No abdominal pain or blood.  GENITOURINARY: No burning on urination, no polyuria NEUROLOGICAL: No headache, dizziness, syncope, paralysis, ataxia, numbness or tingling in the extremities. No change in bowel or bladder control.    MUSCULOSKELETAL: No muscle, back pain, joint pain or stiffness.  LYMPHATICS: No enlarged nodes. No history of splenectomy.  PSYCHIATRIC: No history of depression or anxiety.  ENDOCRINOLOGIC: No reports of sweating, cold or heat intolerance. No polyuria or polydipsia.  Marland Kitchen   Physical Examination Vitals:   07/12/18 0917  BP: 128/72  Pulse: 74  SpO2: 98%   Vitals:   07/12/18 0917  Weight: 184 lb (83.5 kg)  Height: 5\' 5"  (1.651 m)    Gen: resting comfortably, no acute distress HEENT: no scleral icterus, pupils equal round and reactive, no palptable cervical adenopathy,  CV: RRR, no m/r/g, no jvd Resp: Clear to auscultation bilaterally GI: abdomen is soft, non-tender, non-distended, normal bowel sounds, no hepatosplenomegaly MSK: extremities are warm, no edema.  Skin: warm, no rash Neuro:  no focal deficits Psych: appropriate affect    Assessment and Plan   1. Chronic systolic HF - no recent symptoms, continue current meds   2. Aflutter - EKG today shows  he is in normal sinus rhythm - no recent symptoms. Decrease amio to 200mg  bid, down to 200mg  daily at next f/u in 3-4 weeks.    3. AKI - repeat labs with BMET/Mg.   F/u 3-4 weeks    Antoine Poche, M.D

## 2018-07-20 ENCOUNTER — Telehealth: Payer: Self-pay | Admitting: Cardiology

## 2018-07-20 NOTE — Telephone Encounter (Signed)
Patient states he is returning call for test results but I don't see where anyone called him. / tg

## 2018-07-20 NOTE — Telephone Encounter (Signed)
Lab results given to pt.

## 2018-07-23 ENCOUNTER — Encounter: Payer: Self-pay | Admitting: Cardiology

## 2018-08-09 ENCOUNTER — Ambulatory Visit: Payer: Medicare HMO | Admitting: Student

## 2018-09-04 NOTE — Progress Notes (Deleted)
Cardiology Office Note    Date:  09/04/2018   ID:  Martin [name], DOB 15-Sep-1946, MRN [id number]  PCP:  Patient, No Pcp Per  Cardiologist: Dina Rich, MD    No chief complaint on file.   History of Present Illness:    Martin [name] is a 72 y.o. male with past medical history of chronic systolic CHF, paroxysmal atrial flutter (s/p DCCV in 05/2018 and 06/25/2018), recent PE (equivocal on CT Imaging), Stage 3 CKD and rectus sheath hematoma who presents to the office today for one-month follow-up.  He was recently admitted to [facility] from 06/19/2018 to 06/29/2018 for cardiogenic shock and initially requiring pressor support. Echocardiogram imaging had been performed at an outside hospital and showed that his EF was reduced to 20 to 25%. He was noted to have atrial flutter with RVR during admission and required DCCV x2.  Options were reviewed with the patient and his spouse regarding the need for AV nodal ablation and BiV pacing but the patient refused. At the time of hospital follow-up with Dr. Wyline Mood on 07/12/2018, the patient reported breathing had been at baseline and weight was stable at 183 lbs on his home scales. He was maintaining normal sinus rhythm at the time of his office visit and Amiodarone was reduced to 200 mg twice daily with plans to decrease to 200 mg daily at the time of his next office visit. Was continued on his current dosing of Losartan and Lasix with beta-blocker therapy being avoided due to his low output state. Repeat labs were obtained and showed that creatinine had improved to 1.35 (peaked at 4.63 during admission) and electrolytes were stable.   Past Medical History:  Diagnosis Date  . Acute blood loss anemia    Due to rectus sheath hematoma 03/2012  . Acute renal failure (HCC)    03/2012 (not on ACEI due to this) - renal US unremarkable  . Atrial fibrillation (HCC)   . Cardiomyopathy (HCC)    Transient cardiomyopathy with EF 30-35% 03/27/12, improved to 55-60%  04/16/12  . CHF (congestive heart failure) (HCC)   . Essential hypertension, benign   . Hematuria 03/2012  . Hyperglycemia 03/2012  . Pleural effusion 03/2012  . Rectus sheath hematoma    Spontaneous during 03/2012 hospitalization managed conservatively with blood products  . Respiratory failure (HCC)    VDRF 03/2012 in setting of CHF  . Shock (HCC)    03/2012 hospitalization - Cardiogenic shock requiring pressors initially, followed by hemorrhagic shock later in hospitalization  . Unspecified sleep apnea    Suspected during 03/2012 hospitalization     Past Surgical History:  Procedure Laterality Date  . CARDIOVERSION N/A 03/05/2015   Procedure: CARDIOVERSION;  Surgeon: Dolores Patty, MD;  Location: [facility] ENDOSCOPY;  Service: Cardiovascular;  Laterality: N/A;  . CARDIOVERSION N/A 05/20/2016   Procedure: CARDIOVERSION;  Surgeon: Dolores Patty, MD;  Location: [facility] OR;  Service: Cardiovascular;  Laterality: N/A;  . CARDIOVERSION N/A 05/25/2016   Procedure: CARDIOVERSION;  Surgeon: Vesta Mixer, MD;  Location: [facility] ENDOSCOPY;  Service: Cardiovascular;  Laterality: N/A;  . CARDIOVERSION N/A 02/03/2017   Procedure: CARDIOVERSION;  Surgeon: Dolores Patty, MD;  Location: [facility] ENDOSCOPY;  Service: Cardiovascular;  Laterality: N/A;  . CARDIOVERSION N/A 06/22/2018   Procedure: CARDIOVERSION;  Surgeon: Dolores Patty, MD;  Location: [facility] ENDOSCOPY;  Service: Cardiovascular;  Laterality: N/A;  . CARDIOVERSION N/A 06/26/2018   Procedure: CARDIOVERSION;  Surgeon: Dolores Patty, MD;  Location: [facility] ENDOSCOPY;  Service:  Cardiovascular;  Laterality: N/A;  . TEE WITHOUT CARDIOVERSION N/A 03/05/2015   Procedure: TRANSESOPHAGEAL ECHOCARDIOGRAM (TEE);  Surgeon: Dolores Patty, MD;  Location: [facility] ENDOSCOPY;  Service: Cardiovascular;  Laterality: N/A;  . TEE WITHOUT CARDIOVERSION N/A 05/20/2016   Procedure: TRANSESOPHAGEAL ECHOCARDIOGRAM (TEE);  Surgeon: Dolores Patty, MD;  Location: [facility] OR;  Service:  Cardiovascular;  Laterality: N/A;  . TEE WITHOUT CARDIOVERSION N/A 02/03/2017   Procedure: TRANSESOPHAGEAL ECHOCARDIOGRAM (TEE);  Surgeon: Dolores Patty, MD;  Location: [facility] ENDOSCOPY;  Service: Cardiovascular;  Laterality: N/A;    Current Medications: Outpatient Medications Prior to Visit  Medication Sig Dispense Refill  . amiodarone (PACERONE) 200 MG tablet Take 1 tablet (200 mg total) by mouth 2 (two) times daily. 180 tablet 3  . apixaban (ELIQUIS) 5 MG TABS tablet Take 1 tablet (5 mg total) by mouth 2 (two) times daily. 60 tablet 6  . atorvastatin (LIPITOR) 80 MG tablet Take 1 tablet (80 mg total) by mouth daily at 6 PM. 30 tablet 6  . furosemide (LASIX) 40 MG tablet Take 1 tablet (40 mg total) by mouth daily. Take an extra 40mg  (1 tablet) for weight 190lbs or greater 30 tablet 6  . losartan (COZAAR) 25 MG tablet Take 1 tablet (25 mg total) by mouth at bedtime. 30 tablet 6  . Multiple Vitamin (MULTIVITAMIN WITH MINERALS) TABS tablet Take 1 tablet by mouth daily.    . potassium chloride (K-DUR) 10 MEQ tablet Take 2 tablets (20 mEq total) by mouth daily. 60 tablet 6   No facility-administered medications prior to visit.      Allergies:   Amiodarone   Social History   Socioeconomic History  . Marital status: Married    Spouse name: Not on file  . Number of children: Not on file  . Years of education: Not on file  . Highest education level: Not on file  Occupational History  . Not on file  Social Needs  . Financial resource strain: Not on file  . Food insecurity:    Worry: Not on file    Inability: Not on file  . Transportation needs:    Medical: Not on file    Non-medical: Not on file  Tobacco Use  . Smoking status: Never Smoker  . Smokeless tobacco: Never Used  Substance and Sexual Activity  . Alcohol use: Yes    Comment: former  . Drug use: No  . Sexual activity: Yes  Lifestyle  . Physical activity:    Days per week: Not on file    Minutes per session: Not on  file  . Stress: Not on file  Relationships  . Social connections:    Talks on phone: Not on file    Gets together: Not on file    Attends religious service: Not on file    Active member of club or organization: Not on file    Attends meetings of clubs or organizations: Not on file    Relationship status: Not on file  Other Topics Concern  . Not on file  Social History Narrative  . Not on file     Family History:  The patient's ***family history includes Diabetes in his brother; Emphysema in his father; Heart attack in his brother; Heart disease in his father; Other in his mother.   Review of Systems:   Please see the history of present illness.     General:  No chills, fever, night sweats or weight changes.  Cardiovascular:  No chest pain, dyspnea  on exertion, edema, orthopnea, palpitations, paroxysmal nocturnal dyspnea. Dermatological: No rash, lesions/masses Respiratory: No cough, dyspnea Urologic: No hematuria, dysuria Abdominal:   No nausea, vomiting, diarrhea, bright red blood per rectum, melena, or hematemesis Neurologic:  No visual changes, wkns, changes in mental status. All other systems reviewed and are otherwise negative except as noted above.   Physical Exam:    VS:  There were no vitals taken for this visit.   General: Well developed, well nourished,male appearing in no acute distress. Head: Normocephalic, atraumatic, sclera non-icteric, no xanthomas, nares are without discharge.  Neck: No carotid bruits. JVD not elevated.  Lungs: Respirations regular and unlabored, without wheezes or rales.  Heart: ***Regular rate and rhythm. No S3 or S4.  No murmur, no rubs, or gallops appreciated. Abdomen: Soft, non-tender, non-distended with normoactive bowel sounds. No hepatomegaly. No rebound/guarding. No obvious abdominal masses. Msk:  Strength and tone appear normal for age. No joint deformities or effusions. Extremities: No clubbing or cyanosis. No edema.  Distal pedal  pulses are 2+ bilaterally. Neuro: Alert and oriented X 3. Moves all extremities spontaneously. No focal deficits noted. Psych:  Responds to questions appropriately with a normal affect. Skin: No rashes or lesions noted  Wt Readings from Last 3 Encounters:  07/12/18 184 lb (83.5 kg)  06/29/18 176 lb 8 oz (80.1 kg)  04/05/17 185 lb 6.4 oz (84.1 kg)        Studies/Labs Reviewed:   EKG:  EKG is*** ordered today.  The ekg ordered today demonstrates ***  Recent Labs: 06/19/2018: B Natriuretic Peptide 1,948.0 06/29/2018: Hemoglobin 13.8; Platelets 148 07/12/2018: BUN 19; Creatinine, Ser 1.35; Magnesium 2.0; Potassium 4.3; Sodium 139   Lipid Panel    Component Value Date/Time   CHOL 178 06/20/2018 0113   TRIG 136 06/20/2018 0113   HDL 30 (L) 06/20/2018 0113   CHOLHDL 5.9 06/20/2018 0113   VLDL 27 06/20/2018 0113   LDLCALC 121 (H) 06/20/2018 0113    Additional studies/ records that were reviewed today include:   Echocardiogram: 01/2017 Study Conclusions  - Left ventricle: The cavity size was normal. Wall thickness was   normal. Systolic function was severely reduced. The estimated   ejection fraction was in the range of 20% to 25%. Severe diffuse   hypokinesis with no identifiable regional variations. - Mitral valve: Moderate, holosystolicprolapse, involving the   anterior leaflet. There was moderate to severe regurgitation   directed eccentrically and posteriorly. - Left atrium: The atrium was severely dilated. - Right ventricle: Systolic function was moderately reduced. - Right atrium: The atrium was moderately dilated.  Assessment:    No diagnosis found.   Plan:   In order of problems listed above:  1. Chronic Systolic CHF - ***  2. Paroxysmal Atrial Flutter - s/p DCCV in 05/2018 and 06/25/2018. ***  3. Recent PE - Noted to have a possible small right lower lung PE on CT imaging during recent admission. Remains on Eliquis for anticoagulation.  4. Stage 3 CKD    - ***   Medication Adjustments/Labs and Tests Ordered: Current medicines are reviewed at length with the patient today.  Concerns regarding medicines are outlined above.  Medication changes, Labs and Tests ordered today are listed in the Patient Instructions below. There are no Patient Instructions on file for this visit.   Signed, Ellsworth Lennox, PA-C  09/04/2018 12:29 PM    [facility] [address] [place], [place] [address] Phone: [phone]

## 2018-09-05 ENCOUNTER — Ambulatory Visit: Payer: Medicare HMO | Admitting: Student

## 2018-09-05 DIAGNOSIS — R0989 Other specified symptoms and signs involving the circulatory and respiratory systems: Secondary | ICD-10-CM

## 2018-09-06 ENCOUNTER — Encounter: Payer: Self-pay | Admitting: Student

## 2019-04-11 DIAGNOSIS — W1842XA Slipping, tripping and stumbling without falling due to stepping into hole or opening, initial encounter: Secondary | ICD-10-CM | POA: Diagnosis not present

## 2019-04-11 DIAGNOSIS — I110 Hypertensive heart disease with heart failure: Secondary | ICD-10-CM | POA: Diagnosis not present

## 2019-04-11 DIAGNOSIS — S01112A Laceration without foreign body of left eyelid and periocular area, initial encounter: Secondary | ICD-10-CM | POA: Diagnosis not present

## 2019-04-11 DIAGNOSIS — Z79899 Other long term (current) drug therapy: Secondary | ICD-10-CM | POA: Diagnosis not present

## 2019-04-11 DIAGNOSIS — I509 Heart failure, unspecified: Secondary | ICD-10-CM | POA: Diagnosis not present

## 2019-04-18 DIAGNOSIS — Z4801 Encounter for change or removal of surgical wound dressing: Secondary | ICD-10-CM | POA: Diagnosis not present

## 2020-04-21 DIAGNOSIS — Z888 Allergy status to other drugs, medicaments and biological substances status: Secondary | ICD-10-CM | POA: Diagnosis not present

## 2020-04-21 DIAGNOSIS — Z79899 Other long term (current) drug therapy: Secondary | ICD-10-CM | POA: Diagnosis not present

## 2020-04-21 DIAGNOSIS — M7989 Other specified soft tissue disorders: Secondary | ICD-10-CM | POA: Diagnosis not present

## 2020-04-21 DIAGNOSIS — W268XXA Contact with other sharp object(s), not elsewhere classified, initial encounter: Secondary | ICD-10-CM | POA: Diagnosis not present

## 2020-04-21 DIAGNOSIS — I509 Heart failure, unspecified: Secondary | ICD-10-CM | POA: Diagnosis not present

## 2020-04-21 DIAGNOSIS — I110 Hypertensive heart disease with heart failure: Secondary | ICD-10-CM | POA: Diagnosis not present

## 2020-04-21 DIAGNOSIS — L03115 Cellulitis of right lower limb: Secondary | ICD-10-CM | POA: Diagnosis not present

## 2020-04-21 DIAGNOSIS — S80811A Abrasion, right lower leg, initial encounter: Secondary | ICD-10-CM | POA: Diagnosis not present

## 2020-05-28 DIAGNOSIS — I509 Heart failure, unspecified: Secondary | ICD-10-CM | POA: Diagnosis not present

## 2020-05-28 DIAGNOSIS — E669 Obesity, unspecified: Secondary | ICD-10-CM | POA: Diagnosis not present

## 2020-05-28 DIAGNOSIS — S81801A Unspecified open wound, right lower leg, initial encounter: Secondary | ICD-10-CM | POA: Diagnosis not present

## 2020-05-28 DIAGNOSIS — Z0189 Encounter for other specified special examinations: Secondary | ICD-10-CM | POA: Diagnosis not present

## 2020-05-28 DIAGNOSIS — Z6835 Body mass index (BMI) 35.0-35.9, adult: Secondary | ICD-10-CM | POA: Diagnosis not present

## 2020-05-28 DIAGNOSIS — I4891 Unspecified atrial fibrillation: Secondary | ICD-10-CM | POA: Diagnosis not present

## 2020-05-28 DIAGNOSIS — I10 Essential (primary) hypertension: Secondary | ICD-10-CM | POA: Diagnosis not present

## 2020-06-11 DIAGNOSIS — Z125 Encounter for screening for malignant neoplasm of prostate: Secondary | ICD-10-CM | POA: Diagnosis not present

## 2020-06-11 DIAGNOSIS — Z0000 Encounter for general adult medical examination without abnormal findings: Secondary | ICD-10-CM | POA: Diagnosis not present

## 2020-06-11 DIAGNOSIS — E785 Hyperlipidemia, unspecified: Secondary | ICD-10-CM | POA: Diagnosis not present

## 2020-06-16 DIAGNOSIS — S81801A Unspecified open wound, right lower leg, initial encounter: Secondary | ICD-10-CM | POA: Diagnosis not present

## 2020-06-16 DIAGNOSIS — I10 Essential (primary) hypertension: Secondary | ICD-10-CM | POA: Diagnosis not present

## 2020-06-16 DIAGNOSIS — I509 Heart failure, unspecified: Secondary | ICD-10-CM | POA: Diagnosis not present

## 2020-06-16 DIAGNOSIS — E782 Mixed hyperlipidemia: Secondary | ICD-10-CM | POA: Diagnosis not present

## 2020-06-16 DIAGNOSIS — R972 Elevated prostate specific antigen [PSA]: Secondary | ICD-10-CM | POA: Diagnosis not present

## 2020-06-16 DIAGNOSIS — Z6835 Body mass index (BMI) 35.0-35.9, adult: Secondary | ICD-10-CM | POA: Diagnosis not present

## 2020-06-16 DIAGNOSIS — E669 Obesity, unspecified: Secondary | ICD-10-CM | POA: Diagnosis not present

## 2020-06-16 DIAGNOSIS — I4891 Unspecified atrial fibrillation: Secondary | ICD-10-CM | POA: Diagnosis not present

## 2020-06-16 DIAGNOSIS — Z0001 Encounter for general adult medical examination with abnormal findings: Secondary | ICD-10-CM | POA: Diagnosis not present

## 2020-06-30 DIAGNOSIS — S301XXS Contusion of abdominal wall, sequela: Secondary | ICD-10-CM | POA: Diagnosis not present

## 2020-06-30 DIAGNOSIS — I4891 Unspecified atrial fibrillation: Secondary | ICD-10-CM | POA: Diagnosis not present

## 2020-06-30 DIAGNOSIS — Z6835 Body mass index (BMI) 35.0-35.9, adult: Secondary | ICD-10-CM | POA: Diagnosis not present

## 2020-06-30 DIAGNOSIS — R972 Elevated prostate specific antigen [PSA]: Secondary | ICD-10-CM | POA: Diagnosis not present

## 2020-06-30 DIAGNOSIS — I10 Essential (primary) hypertension: Secondary | ICD-10-CM | POA: Diagnosis not present

## 2020-06-30 DIAGNOSIS — I509 Heart failure, unspecified: Secondary | ICD-10-CM | POA: Diagnosis not present

## 2020-08-14 ENCOUNTER — Ambulatory Visit: Payer: Medicare HMO | Admitting: Urology

## 2020-09-10 DIAGNOSIS — Z712 Person consulting for explanation of examination or test findings: Secondary | ICD-10-CM | POA: Diagnosis not present

## 2020-09-10 DIAGNOSIS — E669 Obesity, unspecified: Secondary | ICD-10-CM | POA: Diagnosis not present

## 2020-09-10 DIAGNOSIS — Z0189 Encounter for other specified special examinations: Secondary | ICD-10-CM | POA: Diagnosis not present

## 2020-09-10 DIAGNOSIS — S81801A Unspecified open wound, right lower leg, initial encounter: Secondary | ICD-10-CM | POA: Diagnosis not present

## 2020-09-10 DIAGNOSIS — N182 Chronic kidney disease, stage 2 (mild): Secondary | ICD-10-CM | POA: Diagnosis not present

## 2020-09-10 DIAGNOSIS — S301XXS Contusion of abdominal wall, sequela: Secondary | ICD-10-CM | POA: Diagnosis not present

## 2020-09-10 DIAGNOSIS — R972 Elevated prostate specific antigen [PSA]: Secondary | ICD-10-CM | POA: Diagnosis not present

## 2020-09-10 DIAGNOSIS — Z6835 Body mass index (BMI) 35.0-35.9, adult: Secondary | ICD-10-CM | POA: Diagnosis not present

## 2020-09-10 DIAGNOSIS — E782 Mixed hyperlipidemia: Secondary | ICD-10-CM | POA: Diagnosis not present

## 2020-09-16 DIAGNOSIS — Z0001 Encounter for general adult medical examination with abnormal findings: Secondary | ICD-10-CM | POA: Diagnosis not present

## 2020-09-16 DIAGNOSIS — I10 Essential (primary) hypertension: Secondary | ICD-10-CM | POA: Diagnosis not present

## 2020-09-16 DIAGNOSIS — I4891 Unspecified atrial fibrillation: Secondary | ICD-10-CM | POA: Diagnosis not present

## 2020-09-16 DIAGNOSIS — N182 Chronic kidney disease, stage 2 (mild): Secondary | ICD-10-CM | POA: Diagnosis not present

## 2020-09-16 DIAGNOSIS — E782 Mixed hyperlipidemia: Secondary | ICD-10-CM | POA: Diagnosis not present

## 2020-09-16 DIAGNOSIS — I509 Heart failure, unspecified: Secondary | ICD-10-CM | POA: Diagnosis not present

## 2020-09-16 DIAGNOSIS — E669 Obesity, unspecified: Secondary | ICD-10-CM | POA: Diagnosis not present

## 2020-09-16 DIAGNOSIS — R972 Elevated prostate specific antigen [PSA]: Secondary | ICD-10-CM | POA: Diagnosis not present

## 2020-09-16 DIAGNOSIS — Z6835 Body mass index (BMI) 35.0-35.9, adult: Secondary | ICD-10-CM | POA: Diagnosis not present

## 2020-09-16 DIAGNOSIS — S81801A Unspecified open wound, right lower leg, initial encounter: Secondary | ICD-10-CM | POA: Diagnosis not present

## 2020-12-23 DIAGNOSIS — Z6835 Body mass index (BMI) 35.0-35.9, adult: Secondary | ICD-10-CM | POA: Diagnosis not present

## 2020-12-23 DIAGNOSIS — E782 Mixed hyperlipidemia: Secondary | ICD-10-CM | POA: Diagnosis not present

## 2020-12-23 DIAGNOSIS — Z712 Person consulting for explanation of examination or test findings: Secondary | ICD-10-CM | POA: Diagnosis not present

## 2020-12-23 DIAGNOSIS — Z0001 Encounter for general adult medical examination with abnormal findings: Secondary | ICD-10-CM | POA: Diagnosis not present

## 2020-12-23 DIAGNOSIS — Z0189 Encounter for other specified special examinations: Secondary | ICD-10-CM | POA: Diagnosis not present

## 2020-12-23 DIAGNOSIS — N182 Chronic kidney disease, stage 2 (mild): Secondary | ICD-10-CM | POA: Diagnosis not present

## 2020-12-23 DIAGNOSIS — S301XXS Contusion of abdominal wall, sequela: Secondary | ICD-10-CM | POA: Diagnosis not present

## 2020-12-23 DIAGNOSIS — R972 Elevated prostate specific antigen [PSA]: Secondary | ICD-10-CM | POA: Diagnosis not present

## 2020-12-23 DIAGNOSIS — E669 Obesity, unspecified: Secondary | ICD-10-CM | POA: Diagnosis not present

## 2020-12-29 ENCOUNTER — Other Ambulatory Visit: Payer: Self-pay

## 2020-12-29 DIAGNOSIS — E669 Obesity, unspecified: Secondary | ICD-10-CM | POA: Diagnosis not present

## 2020-12-29 DIAGNOSIS — S301XXS Contusion of abdominal wall, sequela: Secondary | ICD-10-CM | POA: Diagnosis not present

## 2020-12-29 DIAGNOSIS — E782 Mixed hyperlipidemia: Secondary | ICD-10-CM | POA: Diagnosis not present

## 2020-12-29 DIAGNOSIS — Z6835 Body mass index (BMI) 35.0-35.9, adult: Secondary | ICD-10-CM | POA: Diagnosis not present

## 2020-12-29 DIAGNOSIS — I509 Heart failure, unspecified: Secondary | ICD-10-CM | POA: Diagnosis not present

## 2020-12-29 DIAGNOSIS — I10 Essential (primary) hypertension: Secondary | ICD-10-CM | POA: Diagnosis not present

## 2020-12-29 DIAGNOSIS — I4891 Unspecified atrial fibrillation: Secondary | ICD-10-CM | POA: Diagnosis not present

## 2020-12-29 DIAGNOSIS — S81801A Unspecified open wound, right lower leg, initial encounter: Secondary | ICD-10-CM | POA: Diagnosis not present

## 2020-12-29 DIAGNOSIS — R972 Elevated prostate specific antigen [PSA]: Secondary | ICD-10-CM | POA: Diagnosis not present

## 2021-01-28 ENCOUNTER — Ambulatory Visit: Payer: Medicare HMO | Admitting: Urology

## 2021-02-04 ENCOUNTER — Ambulatory Visit: Payer: Medicare HMO | Admitting: Urology

## 2021-04-21 DIAGNOSIS — N182 Chronic kidney disease, stage 2 (mild): Secondary | ICD-10-CM | POA: Diagnosis not present

## 2021-04-21 DIAGNOSIS — I509 Heart failure, unspecified: Secondary | ICD-10-CM | POA: Diagnosis not present

## 2021-04-21 DIAGNOSIS — E782 Mixed hyperlipidemia: Secondary | ICD-10-CM | POA: Diagnosis not present

## 2021-04-21 DIAGNOSIS — N1832 Chronic kidney disease, stage 3b: Secondary | ICD-10-CM | POA: Diagnosis not present

## 2021-04-21 DIAGNOSIS — I10 Essential (primary) hypertension: Secondary | ICD-10-CM | POA: Diagnosis not present

## 2021-04-21 DIAGNOSIS — R7303 Prediabetes: Secondary | ICD-10-CM | POA: Diagnosis not present

## 2021-04-21 DIAGNOSIS — R972 Elevated prostate specific antigen [PSA]: Secondary | ICD-10-CM | POA: Diagnosis not present

## 2021-04-26 DIAGNOSIS — S301XXS Contusion of abdominal wall, sequela: Secondary | ICD-10-CM | POA: Diagnosis not present

## 2021-04-26 DIAGNOSIS — I4891 Unspecified atrial fibrillation: Secondary | ICD-10-CM | POA: Diagnosis not present

## 2021-04-26 DIAGNOSIS — R945 Abnormal results of liver function studies: Secondary | ICD-10-CM | POA: Diagnosis not present

## 2021-04-26 DIAGNOSIS — R972 Elevated prostate specific antigen [PSA]: Secondary | ICD-10-CM | POA: Diagnosis not present

## 2021-04-26 DIAGNOSIS — E782 Mixed hyperlipidemia: Secondary | ICD-10-CM | POA: Diagnosis not present

## 2021-04-26 DIAGNOSIS — I10 Essential (primary) hypertension: Secondary | ICD-10-CM | POA: Diagnosis not present

## 2021-04-26 DIAGNOSIS — N1832 Chronic kidney disease, stage 3b: Secondary | ICD-10-CM | POA: Diagnosis not present

## 2021-04-26 DIAGNOSIS — I509 Heart failure, unspecified: Secondary | ICD-10-CM | POA: Diagnosis not present

## 2021-04-26 DIAGNOSIS — S81801A Unspecified open wound, right lower leg, initial encounter: Secondary | ICD-10-CM | POA: Diagnosis not present

## 2021-05-04 ENCOUNTER — Ambulatory Visit: Payer: Medicare HMO | Admitting: Urology

## 2021-05-04 NOTE — Progress Notes (Incomplete)
History of Present Illness: 75 year old male referred by Dr. Dwana Melena for evaluation and management of elevated PSA.  I am aware of 1 PSA that he has had in the past- PSA was 15.2 on 12.29.2021.  Past Medical History:  Diagnosis Date  . Acute blood loss anemia    Due to rectus sheath hematoma 03/2012  . Acute renal failure (HCC)    03/2012 (not on ACEI due to this) - renal US unremarkable  . Atrial fibrillation (HCC)   . Cardiomyopathy (HCC)    Transient cardiomyopathy with EF 30-35% 03/27/12, improved to 55-60% 04/16/12  . CHF (congestive heart failure) (HCC)   . Essential hypertension, benign   . Hematuria 03/2012  . Hyperglycemia 03/2012  . Pleural effusion 03/2012  . Rectus sheath hematoma    Spontaneous during 03/2012 hospitalization managed conservatively with blood products  . Respiratory failure (HCC)    VDRF 03/2012 in setting of CHF  . Shock (HCC)    03/2012 hospitalization - Cardiogenic shock requiring pressors initially, followed by hemorrhagic shock later in hospitalization  . Unspecified sleep apnea    Suspected during 03/2012 hospitalization     Past Surgical History:  Procedure Laterality Date  . CARDIOVERSION N/A 03/05/2015   Procedure: CARDIOVERSION;  Surgeon: Dolores Patty, MD;  Location: [facility] ENDOSCOPY;  Service: Cardiovascular;  Laterality: N/A;  . CARDIOVERSION N/A 05/20/2016   Procedure: CARDIOVERSION;  Surgeon: Dolores Patty, MD;  Location: [facility] OR;  Service: Cardiovascular;  Laterality: N/A;  . CARDIOVERSION N/A 05/25/2016   Procedure: CARDIOVERSION;  Surgeon: Vesta Mixer, MD;  Location: [facility] ENDOSCOPY;  Service: Cardiovascular;  Laterality: N/A;  . CARDIOVERSION N/A 02/03/2017   Procedure: CARDIOVERSION;  Surgeon: Dolores Patty, MD;  Location: [facility] ENDOSCOPY;  Service: Cardiovascular;  Laterality: N/A;  . CARDIOVERSION N/A 06/22/2018   Procedure: CARDIOVERSION;  Surgeon: Dolores Patty, MD;  Location: [facility] ENDOSCOPY;  Service: Cardiovascular;  Laterality:  N/A;  . CARDIOVERSION N/A 06/26/2018   Procedure: CARDIOVERSION;  Surgeon: Dolores Patty, MD;  Location: [facility] ENDOSCOPY;  Service: Cardiovascular;  Laterality: N/A;  . TEE WITHOUT CARDIOVERSION N/A 03/05/2015   Procedure: TRANSESOPHAGEAL ECHOCARDIOGRAM (TEE);  Surgeon: Dolores Patty, MD;  Location: [facility] ENDOSCOPY;  Service: Cardiovascular;  Laterality: N/A;  . TEE WITHOUT CARDIOVERSION N/A 05/20/2016   Procedure: TRANSESOPHAGEAL ECHOCARDIOGRAM (TEE);  Surgeon: Dolores Patty, MD;  Location: [facility] OR;  Service: Cardiovascular;  Laterality: N/A;  . TEE WITHOUT CARDIOVERSION N/A 02/03/2017   Procedure: TRANSESOPHAGEAL ECHOCARDIOGRAM (TEE);  Surgeon: Dolores Patty, MD;  Location: [facility] ENDOSCOPY;  Service: Cardiovascular;  Laterality: N/A;    Home Medications:  Allergies as of 05/04/2021      Reactions   Amiodarone Rash   Rash resolved when amio stopped 2013, retried in 2017 with same reaction      Medication List       Accurate as of May 04, 2021  8:16 AM. If you have any questions, ask your nurse or doctor.        amiodarone 200 MG tablet Commonly known as: PACERONE Take 1 tablet (200 mg total) by mouth 2 (two) times daily.   apixaban 5 MG Tabs tablet Commonly known as: ELIQUIS Take 1 tablet (5 mg total) by mouth 2 (two) times daily.   atorvastatin 80 MG tablet Commonly known as: LIPITOR Take 1 tablet (80 mg total) by mouth daily at 6 PM.   furosemide 40 MG tablet Commonly known as: Lasix Take 1 tablet (40 mg total) by mouth daily.  Take an extra 40mg  (1 tablet) for weight 190lbs or greater   losartan 25 MG tablet Commonly known as: COZAAR Take 1 tablet (25 mg total) by mouth at bedtime.   multivitamin with minerals Tabs tablet Take 1 tablet by mouth daily.   potassium chloride 10 MEQ tablet Commonly known as: KLOR-CON Take 2 tablets (20 mEq total) by mouth daily.       Allergies:  Allergies  Allergen Reactions  . Amiodarone Rash    Rash resolved when amio  stopped 2013, retried in 2017 with same reaction    Family History  Problem Relation Age of Onset  . Emphysema Father   . Heart disease Father   . Other Mother        died during a leg surgery  . Diabetes Brother   . Heart attack Brother     Social History:  reports that he has never smoked. He has never used smokeless tobacco. He reports current alcohol use. He reports that he does not use drugs.  ROS: A complete review of systems was performed.  All systems are negative except for pertinent findings as noted.  Physical Exam:  Vital signs in last 24 hours: There were no vitals taken for this visit. Constitutional:  Alert and oriented, No acute distress Cardiovascular: Regular rate  Respiratory: Normal respiratory effort GI: Abdomen is soft, nontender, nondistended, no abdominal masses. No CVAT.  Genitourinary: Normal male phallus, testes are descended bilaterally and non-tender and without masses, scrotum is normal in appearance without lesions or masses, perineum is normal on inspection. Lymphatic: No lymphadenopathy Neurologic: Grossly intact, no focal deficits Psychiatric: Normal mood and affect  Laboratory Data:  No results for input(s): WBC, HGB, HCT, PLT in the last 72 hours.  No results for input(s): NA, K, CL, GLUCOSE, BUN, CALCIUM, CREATININE in the last 72 hours.  Invalid input(s): CO3   No results found for this or any previous visit (from the past 24 hour(s)). No results found for this or any previous visit (from the past 240 hour(s)).  Renal Function: No results for input(s): CREATININE in the last 168 hours. CrCl cannot be calculated (Patient's most recent lab result is older than the maximum 21 days allowed.).  Radiologic Imaging: No results found.  Impression/Assessment:  ***  Plan:  ***

## 2021-05-10 DIAGNOSIS — Z1211 Encounter for screening for malignant neoplasm of colon: Secondary | ICD-10-CM | POA: Diagnosis not present

## 2021-05-10 DIAGNOSIS — Z1212 Encounter for screening for malignant neoplasm of rectum: Secondary | ICD-10-CM | POA: Diagnosis not present

## 2021-05-19 LAB — COLOGUARD: COLOGUARD: POSITIVE — AB

## 2021-06-16 ENCOUNTER — Encounter: Payer: Self-pay | Admitting: Internal Medicine

## 2021-08-24 DIAGNOSIS — E1122 Type 2 diabetes mellitus with diabetic chronic kidney disease: Secondary | ICD-10-CM | POA: Diagnosis not present

## 2021-08-24 DIAGNOSIS — I10 Essential (primary) hypertension: Secondary | ICD-10-CM | POA: Diagnosis not present

## 2021-08-24 DIAGNOSIS — R7303 Prediabetes: Secondary | ICD-10-CM | POA: Diagnosis not present

## 2021-08-24 DIAGNOSIS — E782 Mixed hyperlipidemia: Secondary | ICD-10-CM | POA: Diagnosis not present

## 2021-08-31 DIAGNOSIS — R945 Abnormal results of liver function studies: Secondary | ICD-10-CM | POA: Diagnosis not present

## 2021-08-31 DIAGNOSIS — R7303 Prediabetes: Secondary | ICD-10-CM | POA: Diagnosis not present

## 2021-08-31 DIAGNOSIS — I509 Heart failure, unspecified: Secondary | ICD-10-CM | POA: Diagnosis not present

## 2021-08-31 DIAGNOSIS — Z6841 Body Mass Index (BMI) 40.0 and over, adult: Secondary | ICD-10-CM | POA: Diagnosis not present

## 2021-08-31 DIAGNOSIS — R972 Elevated prostate specific antigen [PSA]: Secondary | ICD-10-CM | POA: Diagnosis not present

## 2021-08-31 DIAGNOSIS — E782 Mixed hyperlipidemia: Secondary | ICD-10-CM | POA: Diagnosis not present

## 2021-08-31 DIAGNOSIS — N1832 Chronic kidney disease, stage 3b: Secondary | ICD-10-CM | POA: Diagnosis not present

## 2021-08-31 DIAGNOSIS — I482 Chronic atrial fibrillation, unspecified: Secondary | ICD-10-CM | POA: Diagnosis not present

## 2021-08-31 DIAGNOSIS — I10 Essential (primary) hypertension: Secondary | ICD-10-CM | POA: Diagnosis not present

## 2021-08-31 DIAGNOSIS — Z6835 Body mass index (BMI) 35.0-35.9, adult: Secondary | ICD-10-CM | POA: Diagnosis not present

## 2021-09-02 ENCOUNTER — Encounter: Payer: Self-pay | Admitting: Internal Medicine

## 2021-10-01 ENCOUNTER — Ambulatory Visit: Payer: Medicare HMO | Admitting: Gastroenterology

## 2021-10-11 ENCOUNTER — Ambulatory Visit: Payer: Medicare HMO | Admitting: Urology

## 2021-10-11 ENCOUNTER — Other Ambulatory Visit: Payer: Self-pay

## 2021-10-11 DIAGNOSIS — R972 Elevated prostate specific antigen [PSA]: Secondary | ICD-10-CM

## 2021-10-12 DIAGNOSIS — Z0100 Encounter for examination of eyes and vision without abnormal findings: Secondary | ICD-10-CM | POA: Diagnosis not present

## 2021-11-29 DIAGNOSIS — H02831 Dermatochalasis of right upper eyelid: Secondary | ICD-10-CM | POA: Diagnosis not present

## 2021-11-29 DIAGNOSIS — H25813 Combined forms of age-related cataract, bilateral: Secondary | ICD-10-CM | POA: Diagnosis not present

## 2021-11-29 DIAGNOSIS — H40013 Open angle with borderline findings, low risk, bilateral: Secondary | ICD-10-CM | POA: Diagnosis not present

## 2021-11-29 DIAGNOSIS — H01001 Unspecified blepharitis right upper eyelid: Secondary | ICD-10-CM | POA: Diagnosis not present

## 2021-11-29 DIAGNOSIS — H02834 Dermatochalasis of left upper eyelid: Secondary | ICD-10-CM | POA: Diagnosis not present

## 2022-01-12 NOTE — Progress Notes (Deleted)
Referring Provider:*** Primary Care Physician:  Celene Squibb, MD Primary Gastroenterologist:  Dr. Rayne Du chief complaint on file.   HPI:   Martin [name] is a 76 y.o. male presenting today at the request of *** for positive cologuard.    Severe CHF with EF 20-25% in 2019.  Labs in April 2022 with Hgb 14.1, platelets 144 (L). Cr 1.71.   Past Medical History:  Diagnosis Date   Acute blood loss anemia    Due to rectus sheath hematoma 03/2012   Acute renal failure ([place])    03/2012 (not on ACEI due to this) - renal US unremarkable   Atrial fibrillation ([place])    Cardiomyopathy ([place])    Transient cardiomyopathy with EF 30-35% 03/27/12, improved to 55-60% 04/16/12   CHF (congestive heart failure) ([place])    Essential hypertension, benign    Hematuria 03/2012   Hyperglycemia 03/2012   Pleural effusion 03/2012   Rectus sheath hematoma    Spontaneous during 03/2012 hospitalization managed conservatively with blood products   Respiratory failure ([place])    VDRF 03/2012 in setting of CHF   Shock ([place])    03/2012 hospitalization - Cardiogenic shock requiring pressors initially, followed by hemorrhagic shock later in hospitalization   Unspecified sleep apnea    Suspected during 03/2012 hospitalization     Past Surgical History:  Procedure Laterality Date   CARDIOVERSION N/A 03/05/2015   Procedure: CARDIOVERSION;  Surgeon: Jolaine Artist, MD;  Location: [place];  Service: Cardiovascular;  Laterality: N/A;   CARDIOVERSION N/A 05/20/2016   Procedure: CARDIOVERSION;  Surgeon: Jolaine Artist, MD;  Location: [place];  Service: Cardiovascular;  Laterality: N/A;   CARDIOVERSION N/A 05/25/2016   Procedure: CARDIOVERSION;  Surgeon: Thayer Headings, MD;  Location: [place];  Service: Cardiovascular;  Laterality: N/A;   CARDIOVERSION N/A 02/03/2017   Procedure: CARDIOVERSION;  Surgeon: Jolaine Artist, MD;  Location: [facility] ENDOSCOPY;  Service: Cardiovascular;  Laterality: N/A;   CARDIOVERSION N/A  06/22/2018   Procedure: CARDIOVERSION;  Surgeon: Jolaine Artist, MD;  Location: [facility] ENDOSCOPY;  Service: Cardiovascular;  Laterality: N/A;   CARDIOVERSION N/A 06/26/2018   Procedure: CARDIOVERSION;  Surgeon: Jolaine Artist, MD;  Location: [facility] ENDOSCOPY;  Service: Cardiovascular;  Laterality: N/A;   TEE WITHOUT CARDIOVERSION N/A 03/05/2015   Procedure: TRANSESOPHAGEAL ECHOCARDIOGRAM (TEE);  Surgeon: Jolaine Artist, MD;  Location: [facility] ENDOSCOPY;  Service: Cardiovascular;  Laterality: N/A;   TEE WITHOUT CARDIOVERSION N/A 05/20/2016   Procedure: TRANSESOPHAGEAL ECHOCARDIOGRAM (TEE);  Surgeon: Jolaine Artist, MD;  Location: [facility] OR;  Service: Cardiovascular;  Laterality: N/A;   TEE WITHOUT CARDIOVERSION N/A 02/03/2017   Procedure: TRANSESOPHAGEAL ECHOCARDIOGRAM (TEE);  Surgeon: Jolaine Artist, MD;  Location: [facility] ENDOSCOPY;  Service: Cardiovascular;  Laterality: N/A;    Current Outpatient Medications  Medication Sig Dispense Refill   amiodarone (PACERONE) 200 MG tablet Take 1 tablet (200 mg total) by mouth 2 (two) times daily. 180 tablet 3   apixaban (ELIQUIS) 5 MG TABS tablet Take 1 tablet (5 mg total) by mouth 2 (two) times daily. 60 tablet 6   atorvastatin (LIPITOR) 80 MG tablet Take 1 tablet (80 mg total) by mouth daily at 6 PM. 30 tablet 6   furosemide (LASIX) 40 MG tablet Take 1 tablet (40 mg total) by mouth daily. Take an extra 40mg  (1 tablet) for weight 190lbs or greater 30 tablet 6   losartan (COZAAR) 25 MG tablet Take 1 tablet (25 mg total) by mouth at bedtime. [place]  tablet 6   Multiple Vitamin (MULTIVITAMIN WITH MINERALS) TABS tablet Take 1 tablet by mouth daily.     potassium chloride (K-DUR) 10 MEQ tablet Take 2 tablets (20 mEq total) by mouth daily. 60 tablet 6   No current facility-administered medications for this visit.    Allergies as of 01/13/2022 - Review Complete 07/23/2018  Allergen Reaction Noted   Amiodarone Rash 05/18/2016    Family History  Problem Relation  Age of Onset   Emphysema Father    Heart disease Father    Other Mother        died during a leg surgery   Diabetes Brother    Heart attack Brother     Social History   Socioeconomic History   Marital status: Married    Spouse name: Not on file   Number of children: Not on file   Years of education: Not on file   Highest education level: Not on file  Occupational History   Not on file  Tobacco Use   Smoking status: Never   Smokeless tobacco: Never  Vaping Use   Vaping Use: Never used  Substance and Sexual Activity   Alcohol use: Yes    Comment: former   Drug use: No   Sexual activity: Yes  Other Topics Concern   Not on file  Social History Narrative   Not on file   Social Determinants of Health   Financial Resource Strain: Not on file  Food Insecurity: Not on file  Transportation Needs: Not on file  Physical Activity: Not on file  Stress: Not on file  Social Connections: Not on file  Intimate Partner Violence: Not on file    Review of Systems: Gen: Denies any fever, chills, fatigue, weight loss, lack of appetite.  CV: Denies chest pain, heart palpitations, peripheral edema, syncope.  Resp: Denies shortness of breath at rest or with exertion. Denies wheezing or cough.  GI: Denies dysphagia or odynophagia. Denies jaundice, hematemesis, fecal incontinence. GU : Denies urinary burning, urinary frequency, urinary hesitancy MS: Denies joint pain, muscle weakness, cramps, or limitation of movement.  Derm: Denies rash, itching, dry skin Psych: Denies depression, anxiety, memory loss, and confusion Heme: Denies bruising, bleeding, and enlarged lymph nodes.  Physical Exam: There were no vitals taken for this visit. General:   Alert and oriented. Pleasant and cooperative. Well-nourished and well-developed.  Head:  Normocephalic and atraumatic. Eyes:  Without icterus, sclera clear and conjunctiva pink.  Ears:  Normal auditory acuity. Nose:  No deformity, discharge,   or lesions. Mouth:  No deformity or lesions, oral mucosa pink.  Neck:  Supple, without mass or thyromegaly. Lungs:  Clear to auscultation bilaterally. No wheezes, rales, or rhonchi. No distress.  Heart:  S1, S2 present without murmurs appreciated.  Abdomen:  +BS, soft, non-tender and non-distended. No HSM noted. No guarding or rebound. No masses appreciated.  Rectal:  Deferred  Msk:  Symmetrical without gross deformities. Normal posture. Pulses:  Normal pulses noted. Extremities:  Without clubbing or edema. Neurologic:  Alert and  oriented x4;  grossly normal neurologically. Skin:  Intact without significant lesions or rashes. Cervical Nodes:  No significant cervical adenopathy. Psych:  Alert and cooperative. Normal mood and affect.

## 2022-01-13 ENCOUNTER — Ambulatory Visit: Payer: Medicare HMO | Admitting: Gastroenterology

## 2022-01-31 DIAGNOSIS — H25812 Combined forms of age-related cataract, left eye: Secondary | ICD-10-CM | POA: Diagnosis not present

## 2022-02-01 ENCOUNTER — Encounter (HOSPITAL_COMMUNITY)
Admission: RE | Admit: 2022-02-01 | Discharge: 2022-02-01 | Disposition: A | Discharge status: Home / Self Care | Payer: Medicare HMO | Source: Ambulatory Visit | Attending: Ophthalmology | Admitting: Ophthalmology

## 2022-02-02 NOTE — H&P (Signed)
Surgical History & Physical  Patient Name: Martin [name] DOB: 1946-11-20  Surgery: Cataract extraction with intraocular lens implant phacoemulsification; Left Eye  Surgeon: Fabio Pierce MD Surgery Date:  02-07-22 Pre-Op Date:  01-31-22  HPI: A 67 Yr. old male patient is referred by Dr Steele Berg for cataract eval. 1. The patient complains of difficulty when reading fine print, books, newspaper, instructions etc/blurry vision., which began 1 year ago. The left eye is affected. The episode is gradual. The condition's severity increased since last visit. Symptoms occur when the patient is inside and outside. This is negatively affecting the patient's quality of life and the patient is unable to function adequately in life with the current level of vision. HPI Completed by Dr. Fabio Pierce  Medical History: Heart Problem High Blood Pressure LDL  Review of Systems Negative Allergic/Immunologic Negative Cardiovascular Negative Constitutional Negative Ear, Nose, Mouth & Throat Negative Endocrine Negative Eyes Negative Gastrointestinal Negative Genitourinary Negative Hemotologic/Lymphatic Negative Integumentary Negative Musculoskeletal Negative Neurological Negative Psychiatry Negative Respiratory  Social   Never smoked   Medication Atorvastatin, Amiodarone, Losartan, Furosemide,   Sx/Procedures Hernia Sx,   Drug Allergies   NKDA  History & Physical: Heent: Cataract, Left Eye NECK: supple without bruits LUNGS: lungs clear to auscultation CV: regular rate and rhythm Abdomen: soft and non-tender  Impression & Plan: Assessment: 1.  COMBINED FORMS AGE RELATED CATARACT; Both Eyes (H25.813) 2.  DERMATOCHALASIS, no surgery; Right Upper Lid, Left Upper Lid (H02.831, H02.834) 3.  BLEPHARITIS; Right Upper Lid, Right Lower Lid, Left Upper Lid, Left Lower Lid (H01.001, H01.002,H01.004,H01.005) 4.  Pinguecula; Both Eyes (H11.153) 5.  OAG BORDERLINE FINDINGS LOW RISK; Both Eyes  (H40.013) 6.  ASTIGMATISM, REGULAR; Right Eye (H52.221)  Plan: 1.  Cataract accounts for the patient's decreased vision. This visual impairment is not correctable with a tolerable change in glasses or contact lenses. Cataract surgery with an implantation of a new lens should significantly improve the visual and functional status of the patient. Discussed all risks, benefits, alternatives, and potential complications. Discussed the procedures and recovery. Patient desires to have surgery. A-scan ordered and performed today for intra-ocular lens calculations. The surgery will be performed in order to improve vision for driving, reading, and for eye examinations. Recommend phacoemulsification with intra-ocular lens. Recommend Dextenza for post-operative pain and inflammation. Left Eye Worse first. Dilates well - shugarcaine by protocol.  2.  Asymptomatic, recommend observation for now. Findings, prognosis and treatment options reviewed.  3.  Recommend regular lid cleaning.  4.  Observe; Artificial tears as needed for irritation.  5.  Based on cup-to-disc ratio. OCT rNFL shows: thinning OU. IOPs WNL OU. Monitor. Detailed discussion about glaucoma today including importance of maintaining good follow up and following treatment plan, and the possibility of irreversible blindness as part of this disease process.  6.  Consider toric IOL OD only.

## 2022-02-07 ENCOUNTER — Ambulatory Visit (HOSPITAL_COMMUNITY)
Admission: RE | Admit: 2022-02-07 | Discharge: 2022-02-07 | Disposition: A | Discharge status: Home / Self Care | Payer: Medicare HMO | Attending: Ophthalmology | Admitting: Ophthalmology

## 2022-02-07 ENCOUNTER — Other Ambulatory Visit: Payer: Self-pay

## 2022-02-07 ENCOUNTER — Ambulatory Visit (HOSPITAL_BASED_OUTPATIENT_CLINIC_OR_DEPARTMENT_OTHER): Payer: Medicare HMO | Admitting: Anesthesiology

## 2022-02-07 ENCOUNTER — Encounter (HOSPITAL_COMMUNITY)
Admission: RE | Disposition: A | Discharge status: Home / Self Care | Payer: Self-pay | Source: Home / Self Care | Attending: Ophthalmology

## 2022-02-07 ENCOUNTER — Ambulatory Visit (HOSPITAL_COMMUNITY): Payer: Medicare HMO | Admitting: Anesthesiology

## 2022-02-07 ENCOUNTER — Encounter (HOSPITAL_COMMUNITY): Payer: Self-pay | Admitting: Ophthalmology

## 2022-02-07 DIAGNOSIS — H11153 Pinguecula, bilateral: Secondary | ICD-10-CM | POA: Diagnosis not present

## 2022-02-07 DIAGNOSIS — H02831 Dermatochalasis of right upper eyelid: Secondary | ICD-10-CM | POA: Diagnosis not present

## 2022-02-07 DIAGNOSIS — I4891 Unspecified atrial fibrillation: Secondary | ICD-10-CM | POA: Diagnosis not present

## 2022-02-07 DIAGNOSIS — H52221 Regular astigmatism, right eye: Secondary | ICD-10-CM | POA: Diagnosis not present

## 2022-02-07 DIAGNOSIS — H02834 Dermatochalasis of left upper eyelid: Secondary | ICD-10-CM | POA: Diagnosis not present

## 2022-02-07 DIAGNOSIS — G4730 Sleep apnea, unspecified: Secondary | ICD-10-CM | POA: Diagnosis not present

## 2022-02-07 DIAGNOSIS — H25812 Combined forms of age-related cataract, left eye: Secondary | ICD-10-CM | POA: Diagnosis not present

## 2022-02-07 DIAGNOSIS — H0100A Unspecified blepharitis right eye, upper and lower eyelids: Secondary | ICD-10-CM | POA: Insufficient documentation

## 2022-02-07 DIAGNOSIS — I509 Heart failure, unspecified: Secondary | ICD-10-CM

## 2022-02-07 DIAGNOSIS — H40013 Open angle with borderline findings, low risk, bilateral: Secondary | ICD-10-CM | POA: Diagnosis not present

## 2022-02-07 DIAGNOSIS — I130 Hypertensive heart and chronic kidney disease with heart failure and stage 1 through stage 4 chronic kidney disease, or unspecified chronic kidney disease: Secondary | ICD-10-CM | POA: Diagnosis not present

## 2022-02-07 DIAGNOSIS — H25813 Combined forms of age-related cataract, bilateral: Secondary | ICD-10-CM | POA: Diagnosis not present

## 2022-02-07 DIAGNOSIS — N189 Chronic kidney disease, unspecified: Secondary | ICD-10-CM | POA: Diagnosis not present

## 2022-02-07 DIAGNOSIS — I252 Old myocardial infarction: Secondary | ICD-10-CM | POA: Insufficient documentation

## 2022-02-07 DIAGNOSIS — H0100B Unspecified blepharitis left eye, upper and lower eyelids: Secondary | ICD-10-CM | POA: Insufficient documentation

## 2022-02-07 DIAGNOSIS — I110 Hypertensive heart disease with heart failure: Secondary | ICD-10-CM | POA: Diagnosis not present

## 2022-02-07 HISTORY — PX: CATARACT EXTRACTION W/PHACO: SHX586

## 2022-02-07 SURGERY — PHACOEMULSIFICATION, CATARACT, WITH IOL INSERTION
Anesthesia: Monitor Anesthesia Care | Site: Eye | Laterality: Left

## 2022-02-07 MED ORDER — LIDOCAINE HCL (PF) 1 % IJ SOLN
INTRAOCULAR | Status: DC | PRN
  Administered 2022-02-07: 1 mL via OPHTHALMIC

## 2022-02-07 MED ORDER — STERILE WATER FOR IRRIGATION IR SOLN
Status: DC | PRN
  Administered 2022-02-07: 250 mL

## 2022-02-07 MED ORDER — NEOMYCIN-POLYMYXIN-DEXAMETH 3.5-10000-0.1 OP SUSP
OPHTHALMIC | Status: DC | PRN
  Administered 2022-02-07: 1 [drp] via OPHTHALMIC

## 2022-02-07 MED ORDER — SODIUM CHLORIDE 0.9% FLUSH
INTRAVENOUS | Status: DC | PRN
  Administered 2022-02-07: 5 mL via INTRAVENOUS

## 2022-02-07 MED ORDER — SODIUM HYALURONATE 23MG/ML IO SOSY
PREFILLED_SYRINGE | INTRAOCULAR | Status: DC | PRN
  Administered 2022-02-07: 0.6 mL via INTRAOCULAR

## 2022-02-07 MED ORDER — POVIDONE-IODINE 5 % OP SOLN
OPHTHALMIC | Status: DC | PRN
  Administered 2022-02-07: 1 via OPHTHALMIC

## 2022-02-07 MED ORDER — TROPICAMIDE 1 % OP SOLN
1.0000 [drp] | OPHTHALMIC | Status: AC | PRN
  Administered 2022-02-07 (×3): 1 [drp] via OPHTHALMIC
  Filled 2022-02-07: qty 2

## 2022-02-07 MED ORDER — BSS IO SOLN
INTRAOCULAR | Status: DC | PRN
  Administered 2022-02-07: 15 mL via INTRAOCULAR

## 2022-02-07 MED ORDER — EPINEPHRINE PF 1 MG/ML IJ SOLN
INTRAOCULAR | Status: DC | PRN
  Administered 2022-02-07: 500 mL

## 2022-02-07 MED ORDER — SODIUM HYALURONATE 10 MG/ML IO SOLUTION
PREFILLED_SYRINGE | INTRAOCULAR | Status: DC | PRN
  Administered 2022-02-07: 0.85 mL via INTRAOCULAR

## 2022-02-07 MED ORDER — PHENYLEPHRINE HCL 2.5 % OP SOLN
1.0000 [drp] | OPHTHALMIC | Status: AC | PRN
  Administered 2022-02-07 (×3): 1 [drp] via OPHTHALMIC

## 2022-02-07 MED ORDER — MIDAZOLAM HCL 2 MG/2ML IJ SOLN
INTRAMUSCULAR | Status: AC
  Filled 2022-02-07: qty 2

## 2022-02-07 MED ORDER — TETRACAINE HCL 0.5 % OP SOLN
1.0000 [drp] | OPHTHALMIC | Status: AC | PRN
  Administered 2022-02-07 (×3): 1 [drp] via OPHTHALMIC

## 2022-02-07 MED ORDER — MIDAZOLAM HCL 5 MG/5ML IJ SOLN
INTRAMUSCULAR | Status: DC | PRN
  Administered 2022-02-07: .5 mg via INTRAVENOUS

## 2022-02-07 MED ORDER — LIDOCAINE HCL 3.5 % OP GEL
1.0000 "application " | Freq: Once | OPHTHALMIC | Status: AC
  Administered 2022-02-07: 1 via OPHTHALMIC

## 2022-02-07 SURGICAL SUPPLY — 16 items
CATARACT SUITE SIGHTPATH (MISCELLANEOUS) ×2 IMPLANT
CLOTH BEACON ORANGE TIMEOUT ST (SAFETY) ×2 IMPLANT
EYE SHIELD UNIVERSAL CLEAR (GAUZE/BANDAGES/DRESSINGS) ×1 IMPLANT
FEE CATARACT SUITE SIGHTPATH (MISCELLANEOUS) ×1 IMPLANT
GLOVE SURG UNDER POLY LF SZ6.5 (GLOVE) IMPLANT
GLOVE SURG UNDER POLY LF SZ7 (GLOVE) ×2 IMPLANT
LENS IOL RAYNER 20.0 (Intraocular Lens) ×2 IMPLANT
LENS IOL RAYONE EMV 20.0 (Intraocular Lens) IMPLANT
NDL HYPO 18GX1.5 BLUNT FILL (NEEDLE) ×1 IMPLANT
NEEDLE HYPO 18GX1.5 BLUNT FILL (NEEDLE) ×2 IMPLANT
PAD ARMBOARD 7.5X6 YLW CONV (MISCELLANEOUS) ×2 IMPLANT
RING MALYGIN 7.0 (MISCELLANEOUS) IMPLANT
SYR TB 1ML LL NO SAFETY (SYRINGE) ×2 IMPLANT
TAPE SURG TRANSPORE 1 IN (GAUZE/BANDAGES/DRESSINGS) IMPLANT
TAPE SURGICAL TRANSPORE 1 IN (GAUZE/BANDAGES/DRESSINGS) ×1
WATER STERILE IRR 250ML POUR (IV SOLUTION) ×2 IMPLANT

## 2022-02-07 NOTE — Discharge Instructions (Addendum)
Please discharge patient when stable, will follow up today with Dr. Wrzosek at the [facility] [place] office immediately following discharge.  Leave shield in place until visit.  All paperwork with discharge instructions will be given at the office.  [facility] [place] Address:  [address]  [place], [place] [address]  

## 2022-02-07 NOTE — Interval H&P Note (Signed)
History and Physical Interval Note:  02/07/2022 1:02 PM  Martin [name]  has presented today for surgery, with the diagnosis of combined forms age related cataract; left eye.  The various methods of treatment have been discussed with the patient and family. After consideration of risks, benefits and other options for treatment, the patient has consented to  Procedure(s) with comments: CATARACT EXTRACTION PHACO AND INTRAOCULAR LENS PLACEMENT (IOC) (Left) - left as a surgical intervention.  The patient's history has been reviewed, patient examined, no change in status, stable for surgery.  I have reviewed the patient's chart and labs.  Questions were answered to the patient's satisfaction.     Fabio Pierce

## 2022-02-07 NOTE — Transfer of Care (Signed)
Immediate Anesthesia Transfer of Care Note  Patient: Martin [name]  Procedure(s) Performed: CATARACT EXTRACTION PHACO AND INTRAOCULAR LENS PLACEMENT (IOC) (Left: Eye)  Patient Location: PACU  Anesthesia Type:MAC  Level of Consciousness: awake, alert  and oriented  Airway & Oxygen Therapy: Patient Spontanous Breathing  Post-op Assessment: Report given to RN, Post -op Vital signs reviewed and stable, Patient moving all extremities X 4 and Patient able to stick tongue midline  Post vital signs: Reviewed  Last Vitals:  Vitals Value Taken Time  BP 156/80   Temp 98.0   Pulse 59   Resp 15   SpO2 98     Last Pain:  Vitals:   02/07/22 1143  TempSrc: Oral  PainSc: 0-No pain         Complications: No notable events documented.

## 2022-02-07 NOTE — Anesthesia Preprocedure Evaluation (Signed)
Anesthesia Evaluation  Patient identified by MRN, date of birth, ID band Patient awake    Reviewed: Allergy & Precautions, H&P , NPO status , Patient's Chart, lab work & pertinent test results, reviewed documented beta blocker date and time   Airway Mallampati: II  TM Distance: >3 FB Neck ROM: full    Dental no notable dental hx.    Pulmonary sleep apnea ,    Pulmonary exam normal breath sounds clear to auscultation       Cardiovascular Exercise Tolerance: Good hypertension, + Past MI and +CHF  Atrial Fibrillation  Rhythm:regular Rate:Normal     Neuro/Psych negative neurological ROS  negative psych ROS   GI/Hepatic negative GI ROS, Neg liver ROS,   Endo/Other  negative endocrine ROS  Renal/GU CRFRenal disease  negative genitourinary   Musculoskeletal   Abdominal   Peds  Hematology negative hematology ROS (+)   Anesthesia Other Findings - Left ventricle: The cavity size was dilated. The estimated  ejection fraction was in the range of 15% to 20%. Diffuse  hypokinesis.  - Aortic valve: No evidence of vegetation. There was trivial  regurgitation.  - Mitral valve: There was moderate to severe regurgitation.  - Left atrium: The atrium was moderately to severely dilated. No  evidence of thrombus in the atrial cavity or appendage.  - Right ventricle: Systolic function was severely reduced.  - Right atrium: The atrium was dilated.  - Atrial septum: No defect or patent foramen ovale was identified.  - Tricuspid valve: No evidence of vegetation.  - Pulmonic valve: No evidence of vegetation.   Reproductive/Obstetrics negative OB ROS                             Anesthesia Physical Anesthesia Plan  ASA: 4  Anesthesia Plan: MAC   Post-op Pain Management: Minimal or no pain anticipated   Induction:   PONV Risk Score and Plan:   Airway Management Planned:   Additional Equipment:    Intra-op Plan:   Post-operative Plan:   Informed Consent: I have reviewed the patients History and Physical, chart, labs and discussed the procedure including the risks, benefits and alternatives for the proposed anesthesia with the patient or authorized representative who has indicated his/her understanding and acceptance.     Dental Advisory Given  Plan Discussed with: CRNA  Anesthesia Plan Comments:         Anesthesia Quick Evaluation

## 2022-02-07 NOTE — Op Note (Signed)
Date of procedure: 02/07/22  Pre-operative diagnosis: Visually significant age-related combined cataract, Left Eye (H25.812)  Post-operative diagnosis: Visually significant age-related combined cataract, Left Eye (H25.812)  Procedure: Removal of cataract via phacoemulsification and insertion of intra-ocular lens Rayner [id number] +20.0D into the capsular bag of the Left Eye  Attending surgeon: Gerda Diss. Pasqual Farias, MD, MA  Anesthesia: MAC, Topical [place]  Complications: None  Estimated Blood Loss: <108m (minimal)  Specimens: None  Implants: As above  Indications:  Visually significant age-related cataract, Left Eye  Procedure:  The patient was seen and identified in the pre-operative area. The operative eye was identified and dilated.  The operative eye was marked.  Topical anesthesia was administered to the operative eye.     The patient was then to the operative suite and placed in the supine position.  A timeout was performed confirming the patient, procedure to be performed, and all other relevant information.   The patient's face was prepped and draped in the usual fashion for intra-ocular surgery.  A lid speculum was placed into the operative eye and the surgical microscope moved into place and focused.  An inferotemporal paracentesis was created using a 20 gauge paracentesis blade.  Shugarcaine was injected into the anterior chamber.  Viscoelastic was injected into the anterior chamber.  A temporal clear-corneal main wound incision was created using a 2.433mmicrokeratome.  A continuous curvilinear capsulorrhexis was initiated using an irrigating cystitome and completed using capsulorrhexis forceps.  Hydrodissection and hydrodeliniation were performed.  Viscoelastic was injected into the anterior chamber.  A phacoemulsification handpiece and a chopper as a second instrument were used to remove the nucleus and epinucleus. The irrigation/aspiration handpiece was used to remove any remaining  cortical material.   The capsular bag was reinflated with viscoelastic, checked, and found to be intact.  The intraocular lens was inserted into the capsular bag.  The irrigation/aspiration handpiece was used to remove any remaining viscoelastic.  The clear corneal wound and paracentesis wounds were then hydrated and checked with Weck-Cels to be watertight.  Maxitrol was instilled in the eye. The lid-speculum was removed.  The drape was removed.  The patient's face was cleaned with a wet and dry 4x4.    A clear shield was taped over the eye. The patient was taken to the post-operative care unit in good condition, having tolerated the procedure well.  Post-Op Instructions: The patient will [facility] post-operative evaluation and will receive all other orders and instructions.

## 2022-02-08 ENCOUNTER — Encounter (HOSPITAL_COMMUNITY): Payer: Self-pay | Admitting: Ophthalmology

## 2022-02-08 NOTE — Anesthesia Postprocedure Evaluation (Signed)
Anesthesia Post Note  Patient: Scientific laboratory technician  Procedure(s) Performed: CATARACT EXTRACTION PHACO AND INTRAOCULAR LENS PLACEMENT (IOC) (Left: Eye)  Patient location during evaluation: Phase II Anesthesia Type: MAC Level of consciousness: awake Pain management: pain level controlled Vital Signs Assessment: post-procedure vital signs reviewed and stable Respiratory status: spontaneous breathing and respiratory function stable Cardiovascular status: blood pressure returned to baseline and stable Postop Assessment: no headache and no apparent nausea or vomiting Anesthetic complications: no Comments: Late entry   No notable events documented.   Last Vitals:  Vitals:   02/07/22 1143 02/07/22 1328  BP: (!) 147/77 (!) 156/80  Pulse: 70 (!) 53  Resp: 18 16  Temp: 36.7 C 37.2 C  SpO2: (!) 17% 99%    Last Pain:  Vitals:   02/07/22 1328  TempSrc: Oral  PainSc: 0-No pain                 Louann Sjogren

## 2022-02-10 DIAGNOSIS — H25811 Combined forms of age-related cataract, right eye: Secondary | ICD-10-CM | POA: Diagnosis not present

## 2022-02-15 ENCOUNTER — Encounter (HOSPITAL_COMMUNITY): Payer: Self-pay

## 2022-02-15 ENCOUNTER — Other Ambulatory Visit: Payer: Self-pay

## 2022-02-15 ENCOUNTER — Encounter (HOSPITAL_COMMUNITY)
Admission: RE | Admit: 2022-02-15 | Discharge: 2022-02-15 | Disposition: A | Discharge status: Home / Self Care | Payer: Medicare HMO | Source: Ambulatory Visit | Attending: Ophthalmology | Admitting: Ophthalmology

## 2022-02-18 NOTE — H&P (Signed)
Surgical History & Physical  Patient Name: Martin [name] DOB: December 29, 1945  Surgery: Cataract extraction with intraocular lens implant phacoemulsification; Right Eye  Surgeon: Fabio Pierce MD Surgery Date:  02-21-22 Pre-Op Date:  02-14-22  HPI: A 31 Yr. old male patient is returning after cataract post-op. The left eye is affected. Status post cataract post-op, which began 1 weeks ago: Since the last visit, the affected area is doing well. The patient's vision is improved. Patient is following medication instructions. The patient experiences no eye pain and no flashes, floater, shadow, curtain or veil. The patient also presents for pre op OD. The vision continues to be blurry in the right eye. This is negatively affecting the patient's quality of life and the patient is unable to function adequately in life with the current level of vision. HPI was performed by Fabio Pierce .  Medical History: Heart Problem High Blood Pressure LDL  Review of Systems Negative Allergic/Immunologic Negative Cardiovascular Negative Constitutional Negative Ear, Nose, Mouth & Throat Negative Endocrine Negative Eyes Negative Gastrointestinal Negative Genitourinary Negative Hemotologic/Lymphatic Negative Integumentary Negative Musculoskeletal Negative Neurological Negative Psychiatry Negative Respiratory  Social   Never smoked   Medication Prednisolone-Moxifloxacin-Bromfenac, Atorvastatin, Amiodarone, Losartan, Furosemide,   Sx/Procedures Phaco c IOL OS, Hernia Sx,   Drug Allergies   NKDA  History & Physical: Heent: Cataract, Right Eye NECK: supple without bruits LUNGS: lungs clear to auscultation CV: regular rate and rhythm Abdomen: soft and non-tender  Impression & Plan: Assessment: 1.  CATARACT EXTRACTION STATUS; Left Eye (Z98.42) 2.  COMBINED FORMS AGE RELATED CATARACT; Right Eye (H25.811) 3.  Hyperopia ; Both Eyes (H52.03)  Plan: 1.  1 week after cataract surgery. Doing well  with improved vision and normal eye pressure. Call with any problems or concerns. Continue Pred-Moxi-Brom 2x/day for 3 more weeks.  2.  Cataract accounts for the patient's decreased vision. This visual impairment is not correctable with a tolerable change in glasses or contact lenses. Cataract surgery with an implantation of a new lens should significantly improve the visual and functional status of the patient. Discussed all risks, benefits, alternatives, and potential complications. Discussed the procedures and recovery. Patient desires to have surgery. A-scan ordered and performed today for intra-ocular lens calculations. The surgery will be performed in order to improve vision for driving, reading, and for eye examinations. Recommend phacoemulsification with intra-ocular lens. Recommend Dextenza for post-operative pain and inflammation. Right Eye. Surgery required to correct imbalance of vision. Dilates well - shugarcaine by protocol.  3.

## 2022-02-21 ENCOUNTER — Encounter (HOSPITAL_COMMUNITY)
Admission: RE | Disposition: A | Discharge status: Home / Self Care | Payer: Self-pay | Source: Home / Self Care | Attending: Ophthalmology

## 2022-02-21 ENCOUNTER — Encounter (HOSPITAL_COMMUNITY): Payer: Self-pay | Admitting: Ophthalmology

## 2022-02-21 ENCOUNTER — Ambulatory Visit (HOSPITAL_COMMUNITY): Payer: Medicare HMO | Admitting: Anesthesiology

## 2022-02-21 ENCOUNTER — Ambulatory Visit (HOSPITAL_COMMUNITY)
Admission: RE | Admit: 2022-02-21 | Discharge: 2022-02-21 | Disposition: A | Discharge status: Home / Self Care | Payer: Medicare HMO | Attending: Ophthalmology | Admitting: Ophthalmology

## 2022-02-21 ENCOUNTER — Ambulatory Visit (HOSPITAL_BASED_OUTPATIENT_CLINIC_OR_DEPARTMENT_OTHER): Payer: Medicare HMO | Admitting: Anesthesiology

## 2022-02-21 DIAGNOSIS — H5203 Hypermetropia, bilateral: Secondary | ICD-10-CM | POA: Insufficient documentation

## 2022-02-21 DIAGNOSIS — D759 Disease of blood and blood-forming organs, unspecified: Secondary | ICD-10-CM | POA: Diagnosis not present

## 2022-02-21 DIAGNOSIS — I509 Heart failure, unspecified: Secondary | ICD-10-CM | POA: Diagnosis not present

## 2022-02-21 DIAGNOSIS — H25811 Combined forms of age-related cataract, right eye: Secondary | ICD-10-CM | POA: Diagnosis not present

## 2022-02-21 DIAGNOSIS — Z9842 Cataract extraction status, left eye: Secondary | ICD-10-CM | POA: Diagnosis not present

## 2022-02-21 DIAGNOSIS — D649 Anemia, unspecified: Secondary | ICD-10-CM | POA: Insufficient documentation

## 2022-02-21 DIAGNOSIS — I252 Old myocardial infarction: Secondary | ICD-10-CM | POA: Diagnosis not present

## 2022-02-21 DIAGNOSIS — I110 Hypertensive heart disease with heart failure: Secondary | ICD-10-CM | POA: Insufficient documentation

## 2022-02-21 DIAGNOSIS — I4891 Unspecified atrial fibrillation: Secondary | ICD-10-CM | POA: Insufficient documentation

## 2022-02-21 DIAGNOSIS — N289 Disorder of kidney and ureter, unspecified: Secondary | ICD-10-CM | POA: Diagnosis not present

## 2022-02-21 DIAGNOSIS — G4730 Sleep apnea, unspecified: Secondary | ICD-10-CM | POA: Diagnosis not present

## 2022-02-21 HISTORY — PX: CATARACT EXTRACTION W/PHACO: SHX586

## 2022-02-21 SURGERY — PHACOEMULSIFICATION, CATARACT, WITH IOL INSERTION
Anesthesia: Monitor Anesthesia Care | Site: Eye | Laterality: Right

## 2022-02-21 MED ORDER — BSS IO SOLN
INTRAOCULAR | Status: DC | PRN
  Administered 2022-02-21: 15 mL via INTRAOCULAR

## 2022-02-21 MED ORDER — SODIUM HYALURONATE 10 MG/ML IO SOLUTION
PREFILLED_SYRINGE | INTRAOCULAR | Status: DC | PRN
  Administered 2022-02-21: 0.85 mL via INTRAOCULAR

## 2022-02-21 MED ORDER — PHENYLEPHRINE HCL 2.5 % OP SOLN
1.0000 [drp] | OPHTHALMIC | Status: AC | PRN
  Administered 2022-02-21 (×3): 1 [drp] via OPHTHALMIC

## 2022-02-21 MED ORDER — NEOMYCIN-POLYMYXIN-DEXAMETH 3.5-10000-0.1 OP SUSP
OPHTHALMIC | Status: DC | PRN
  Administered 2022-02-21: 1 [drp] via OPHTHALMIC

## 2022-02-21 MED ORDER — EPINEPHRINE PF 1 MG/ML IJ SOLN
INTRAMUSCULAR | Status: AC
  Filled 2022-02-21: qty 2

## 2022-02-21 MED ORDER — TETRACAINE HCL 0.5 % OP SOLN
1.0000 [drp] | OPHTHALMIC | Status: AC | PRN
  Administered 2022-02-21 (×3): 1 [drp] via OPHTHALMIC

## 2022-02-21 MED ORDER — STERILE WATER FOR IRRIGATION IR SOLN
Status: DC | PRN
Start: 2022-02-21 — End: 2022-02-21
  Administered 2022-02-21: 250 mL

## 2022-02-21 MED ORDER — LIDOCAINE HCL 3.5 % OP GEL
1.0000 "application " | Freq: Once | OPHTHALMIC | Status: AC
  Administered 2022-02-21: 1 via OPHTHALMIC

## 2022-02-21 MED ORDER — SODIUM HYALURONATE 23MG/ML IO SOSY
PREFILLED_SYRINGE | INTRAOCULAR | Status: DC | PRN
  Administered 2022-02-21: 0.6 mL via INTRAOCULAR

## 2022-02-21 MED ORDER — LIDOCAINE HCL (PF) 1 % IJ SOLN
INTRAOCULAR | Status: DC | PRN
  Administered 2022-02-21: 1 mL via OPHTHALMIC

## 2022-02-21 MED ORDER — MIDAZOLAM HCL 2 MG/2ML IJ SOLN
INTRAMUSCULAR | Status: AC
  Filled 2022-02-21: qty 2

## 2022-02-21 MED ORDER — POVIDONE-IODINE 5 % OP SOLN
OPHTHALMIC | Status: DC | PRN
  Administered 2022-02-21: 1 via OPHTHALMIC

## 2022-02-21 MED ORDER — EPINEPHRINE PF 1 MG/ML IJ SOLN
INTRAOCULAR | Status: DC | PRN
  Administered 2022-02-21: 500 mL

## 2022-02-21 MED ORDER — TROPICAMIDE 1 % OP SOLN
1.0000 [drp] | OPHTHALMIC | Status: AC | PRN
  Administered 2022-02-21 (×3): 1 [drp] via OPHTHALMIC
  Filled 2022-02-21: qty 2

## 2022-02-21 SURGICAL SUPPLY — 16 items
CATARACT SUITE SIGHTPATH (MISCELLANEOUS) ×2 IMPLANT
CLOTH BEACON ORANGE TIMEOUT ST (SAFETY) ×2 IMPLANT
EYE SHIELD UNIVERSAL CLEAR (GAUZE/BANDAGES/DRESSINGS) ×1 IMPLANT
FEE CATARACT SUITE SIGHTPATH (MISCELLANEOUS) ×1 IMPLANT
GLOVE SURG UNDER POLY LF SZ6.5 (GLOVE) ×1 IMPLANT
GLOVE SURG UNDER POLY LF SZ7 (GLOVE) ×1 IMPLANT
LENS IOL RAYNER 20.0 (Intraocular Lens) ×2 IMPLANT
LENS IOL RAYONE EMV 20.0 (Intraocular Lens) IMPLANT
NDL HYPO 18GX1.5 BLUNT FILL (NEEDLE) ×1 IMPLANT
NEEDLE HYPO 18GX1.5 BLUNT FILL (NEEDLE) ×2 IMPLANT
PAD ARMBOARD 7.5X6 YLW CONV (MISCELLANEOUS) ×2 IMPLANT
RING MALYGIN 7.0 (MISCELLANEOUS) IMPLANT
SYR TB 1ML LL NO SAFETY (SYRINGE) ×2 IMPLANT
TAPE SURG TRANSPORE 1 IN (GAUZE/BANDAGES/DRESSINGS) IMPLANT
TAPE SURGICAL TRANSPORE 1 IN (GAUZE/BANDAGES/DRESSINGS) ×1
WATER STERILE IRR 250ML POUR (IV SOLUTION) ×2 IMPLANT

## 2022-02-21 NOTE — Anesthesia Postprocedure Evaluation (Signed)
Anesthesia Post Note  Patient: Scientific laboratory technician  Procedure(s) Performed: CATARACT EXTRACTION PHACO AND INTRAOCULAR LENS PLACEMENT (IOC) (Right: Eye)  Patient location during evaluation: Phase II Anesthesia Type: MAC Level of consciousness: awake and alert and oriented Pain management: pain level controlled Vital Signs Assessment: post-procedure vital signs reviewed and stable Respiratory status: spontaneous breathing, nonlabored ventilation and respiratory function stable Cardiovascular status: stable and blood pressure returned to baseline Postop Assessment: no apparent nausea or vomiting Anesthetic complications: no   No notable events documented.   Last Vitals:  Vitals:   02/21/22 0944  BP: 139/80  Pulse: (!) 51  Resp: 18  Temp: 36.6 C  SpO2: 99%    Last Pain:  Vitals:   02/21/22 0944  TempSrc: Oral  PainSc: 0-No pain                 Zanetta Dehaan C Xayvion Shirah

## 2022-02-21 NOTE — Anesthesia Preprocedure Evaluation (Addendum)
Anesthesia Evaluation  Patient identified by MRN, date of birth, ID band Patient awake    Reviewed: Allergy & Precautions, NPO status , Patient's Chart, lab work & pertinent test results  Airway Mallampati: II  TM Distance: >3 FB Neck ROM: Full    Dental  (+) Dental Advisory Given, Missing   Pulmonary sleep apnea ,    Pulmonary exam normal breath sounds clear to auscultation       Cardiovascular Exercise Tolerance: Good hypertension, Pt. on medications + Past MI and +CHF (EF 15 -20%)  + dysrhythmias Atrial Fibrillation  Rhythm:Irregular Rate:Normal  01/2017  Left ventricle: The cavity size was dilated. The estimated  ejection fraction was in the range of 15% to 20%. Diffuse  hypokinesis.  - Aortic valve: No evidence of vegetation. There was trivial  regurgitation.  - Mitral valve: There was moderate to severe regurgitation.  - Left atrium: The atrium was moderately to severely dilated. No evidence of thrombus in the atrial cavity or appendage.  - Right ventricle: Systolic function was severely reduced.  - Right atrium: The atrium was dilated.  - Atrial septum: No defect or patent foramen ovale was identified.  - Tricuspid valve: No evidence of vegetation.  - Pulmonic valve: No evidence of vegetation.    Neuro/Psych negative neurological ROS  negative psych ROS   GI/Hepatic negative GI ROS, Neg liver ROS,   Endo/Other  negative endocrine ROS  Renal/GU Renal disease  negative genitourinary   Musculoskeletal negative musculoskeletal ROS (+)   Abdominal   Peds negative pediatric ROS (+)  Hematology  (+) Blood dyscrasia, anemia ,   Anesthesia Other Findings   Reproductive/Obstetrics negative OB ROS                            Anesthesia Physical Anesthesia Plan  ASA: 4  Anesthesia Plan: MAC   Post-op Pain Management: Minimal or no pain anticipated   Induction:   PONV Risk  Score and Plan: Treatment may vary due to age or medical condition  Airway Management Planned: Nasal Cannula and Natural Airway  Additional Equipment:   Intra-op Plan:   Post-operative Plan:   Informed Consent: I have reviewed the patients History and Physical, chart, labs and discussed the procedure including the risks, benefits and alternatives for the proposed anesthesia with the patient or authorized representative who has indicated his/her understanding and acceptance.     Dental advisory given  Plan Discussed with: Surgeon  Anesthesia Plan Comments:         Anesthesia Quick Evaluation

## 2022-02-21 NOTE — Anesthesia Procedure Notes (Signed)
Procedure Name: MAC Date/Time: 02/21/2022 9:27 AM Performed by: Orlie Dakin, CRNA Pre-anesthesia Checklist: Patient identified, Emergency Drugs available, Suction available and Patient being monitored Patient Re-evaluated:Patient Re-evaluated prior to induction Oxygen Delivery Method: Nasal cannula Placement Confirmation: positive ETCO2

## 2022-02-21 NOTE — Discharge Instructions (Signed)
Please discharge patient when stable, will follow up today with Dr. Boniface Goffe at the [facility] office immediately following discharge.  Leave shield in place until visit.  All paperwork with discharge instructions will be given at the office.  [facility] Address:  [address]  [place], [place] [address]  

## 2022-02-21 NOTE — Interval H&P Note (Signed)
History and Physical Interval Note:  02/21/2022 9:18 AM  Martin [name]  has presented today for surgery, with the diagnosis of combined forms age related cataract; right.  The various methods of treatment have been discussed with the patient and family. After consideration of risks, benefits and other options for treatment, the patient has consented to  Procedure(s) with comments: CATARACT EXTRACTION PHACO AND INTRAOCULAR LENS PLACEMENT (IOC) (Right) - right as a surgical intervention.  The patient's history has been reviewed, patient examined, no change in status, stable for surgery.  I have reviewed the patient's chart and labs.  Questions were answered to the patient's satisfaction.     Baruch Goldmann

## 2022-02-21 NOTE — Op Note (Signed)
Date of procedure: 02/21/22  Pre-operative diagnosis:  Visually significant combined form age-related cataract, Right Eye (H25.811)  Post-operative diagnosis:  Visually significant combined form age-related cataract, Right Eye (H25.811)  Procedure: Removal of cataract via phacoemulsification and insertion of intra-ocular lens Rayner [id number] +20.0D into the capsular bag of the Right Eye  Attending surgeon: Gerda Diss. Keatin Benham, MD, MA  Anesthesia: MAC, Topical Akten  Complications: None  Estimated Blood Loss: <63m (minimal)  Specimens: None  Implants: As above  Indications:  Visually significant age-related cataract, Right Eye  Procedure:  The patient was seen and identified in the pre-operative area. The operative eye was identified and dilated.  The operative eye was marked.  Topical anesthesia was administered to the operative eye.     The patient was then to the operative suite and placed in the supine position.  A timeout was performed confirming the patient, procedure to be performed, and all other relevant information.   The patient's face was prepped and draped in the usual fashion for intra-ocular surgery.  A lid speculum was placed into the operative eye and the surgical microscope moved into place and focused.  A superotemporal paracentesis was created using a 20 gauge paracentesis blade.  Shugarcaine was injected into the anterior chamber.  Viscoelastic was injected into the anterior chamber.  A temporal clear-corneal main wound incision was created using a 2.421mmicrokeratome.  A continuous curvilinear capsulorrhexis was initiated using an irrigating cystitome and completed using capsulorrhexis forceps.  Hydrodissection and hydrodeliniation were performed.  Viscoelastic was injected into the anterior chamber.  A phacoemulsification handpiece and a chopper as a second instrument were used to remove the nucleus and epinucleus. The irrigation/aspiration handpiece was used to remove any  remaining cortical material.   The capsular bag was reinflated with viscoelastic, checked, and found to be intact.  The intraocular lens was inserted into the capsular bag.  The irrigation/aspiration handpiece was used to remove any remaining viscoelastic.  The clear corneal wound and paracentesis wounds were then hydrated and checked with Weck-Cels to be watertight.  The lid-speculum was removed.  The drape was removed.  The patient's face was cleaned with a wet and dry 4x4.   Maxitrol was instilled in the eye. A clear shield was taped over the eye. The patient was taken to the post-operative care unit in good condition, having tolerated the procedure well.  Post-Op Instructions: [facility] and will receive all other orders and instructions.

## 2022-02-21 NOTE — Transfer of Care (Signed)
Immediate Anesthesia Transfer of Care Note  Patient: Martin [name]  Procedure(s) Performed: CATARACT EXTRACTION PHACO AND INTRAOCULAR LENS PLACEMENT (IOC) (Right: Eye)  Patient Location: Short Stay  Anesthesia Type:MAC  Level of Consciousness: awake, alert  and oriented  Airway & Oxygen Therapy: Patient Spontanous Breathing  Post-op Assessment: Report given to RN and Post -op Vital signs reviewed and stable  Post vital signs: Reviewed and stable  Last Vitals:  Vitals Value Taken Time  BP    Temp    Pulse    Resp    SpO2      Last Pain: There were no vitals filed for this visit.       Complications: No notable events documented.

## 2022-02-23 ENCOUNTER — Encounter (HOSPITAL_COMMUNITY): Payer: Self-pay | Admitting: Ophthalmology

## 2022-03-02 DIAGNOSIS — E782 Mixed hyperlipidemia: Secondary | ICD-10-CM | POA: Diagnosis not present

## 2022-03-02 DIAGNOSIS — R7303 Prediabetes: Secondary | ICD-10-CM | POA: Diagnosis not present

## 2022-03-09 DIAGNOSIS — Z6835 Body mass index (BMI) 35.0-35.9, adult: Secondary | ICD-10-CM | POA: Diagnosis not present

## 2022-03-09 DIAGNOSIS — E782 Mixed hyperlipidemia: Secondary | ICD-10-CM | POA: Diagnosis not present

## 2022-03-09 DIAGNOSIS — R7303 Prediabetes: Secondary | ICD-10-CM | POA: Diagnosis not present

## 2022-03-09 DIAGNOSIS — I509 Heart failure, unspecified: Secondary | ICD-10-CM | POA: Diagnosis not present

## 2022-03-09 DIAGNOSIS — R972 Elevated prostate specific antigen [PSA]: Secondary | ICD-10-CM | POA: Diagnosis not present

## 2022-03-09 DIAGNOSIS — N1832 Chronic kidney disease, stage 3b: Secondary | ICD-10-CM | POA: Diagnosis not present

## 2022-03-09 DIAGNOSIS — R945 Abnormal results of liver function studies: Secondary | ICD-10-CM | POA: Diagnosis not present

## 2022-03-09 DIAGNOSIS — I482 Chronic atrial fibrillation, unspecified: Secondary | ICD-10-CM | POA: Diagnosis not present

## 2022-03-09 DIAGNOSIS — I10 Essential (primary) hypertension: Secondary | ICD-10-CM | POA: Diagnosis not present

## 2022-03-14 ENCOUNTER — Encounter: Payer: Self-pay | Admitting: *Deleted

## 2022-03-15 ENCOUNTER — Ambulatory Visit: Payer: Medicare HMO | Admitting: Cardiology

## 2022-03-15 ENCOUNTER — Encounter: Payer: Self-pay | Admitting: Cardiology

## 2022-03-15 VITALS — BP 148/80 | HR 70 | Ht 65.0 in | Wt 206.0 lb

## 2022-03-15 DIAGNOSIS — I4892 Unspecified atrial flutter: Secondary | ICD-10-CM | POA: Diagnosis not present

## 2022-03-15 DIAGNOSIS — I5022 Chronic systolic (congestive) heart failure: Secondary | ICD-10-CM | POA: Diagnosis not present

## 2022-03-15 DIAGNOSIS — N183 Chronic kidney disease, stage 3 unspecified: Secondary | ICD-10-CM | POA: Diagnosis not present

## 2022-03-15 MED ORDER — AMIODARONE HCL 200 MG PO TABS: 200.0000 mg | ORAL_TABLET | Freq: Every day | ORAL | Status: AC

## 2022-03-15 NOTE — Progress Notes (Signed)
? ? ? ?Clinical Summary ?Mr. Skillings is a 76 y.o.male last seen 07/12/2018. Seen today as a new patient for the following medical problems.  ?  ? ?  ?1. Chronic systolic HF ?-  admission 06/2018 with cardiogenic shock requiring milrionone and levo drip ?- presented after stopping home meds x 6 months ?- echo 05/2018 [facility] LVEF 20-25% ?- no beta blocker due to low CO. He refused to f/u in CHF clinic ?- poor renal function, poor cath candidate ?  ? ?No SOB/DOE, no recent edema ?- compliant with meds ? ? ?2. Aflutter ?- s/p DCCV on 06/22/18 and 06/25/18 ?- started on amio 400mg  bid, plans to do 200mg  bid starting 07/12/18 ?- from notes discussions about av nodal ablation with plans for BiV pacing but patient refused ?  ? ?- history of rectus sheath heatoma in 2013, he stopped anticoag at that time. Retried few years later and had nose bleeds, he stopped eliquis at that time on his own. He is not interested in retrying nor is he interested in watchmand evice.  ? ?  ?3. CKD ? - recent labs with pcp ?  ? ?Past Medical History:  ?Diagnosis Date  ? Acute blood loss anemia   ? Due to rectus sheath hematoma 03/2012  ? Acute renal failure ([place])   ? 03/2012 (not on ACEI due to this) - renal US unremarkable  ? Atrial fibrillation ([place])   ? Cardiomyopathy ([place])   ? Transient cardiomyopathy with EF 30-35% 03/27/12, improved to 55-60% 04/16/12  ? CHF (congestive heart failure) ([place])   ? Essential hypertension, benign   ? Hematuria 03/2012  ? Hyperglycemia 03/2012  ? Pleural effusion 03/2012  ? Rectus sheath hematoma   ? Spontaneous during 03/2012 hospitalization managed conservatively with blood products  ? Respiratory failure ([place])   ? VDRF 03/2012 in setting of CHF  ? Shock ([place])   ? 03/2012 hospitalization - Cardiogenic shock requiring pressors initially, followed by hemorrhagic shock later in hospitalization  ? Unspecified sleep apnea   ? Suspected during 03/2012 hospitalization   ? ? ? ?Allergies  ?Allergen Reactions  ? Amiodarone Rash  ?   Rash resolved when amio stopped 2013, retried in 2017 with same reaction  ? ? ? ?Current Outpatient Medications  ?Medication Sig Dispense Refill  ? amiodarone (PACERONE) 200 MG tablet Take 1 tablet (200 mg total) by mouth 2 (two) times daily. 180 tablet 3  ? apixaban (ELIQUIS) 5 MG TABS tablet Take 1 tablet (5 mg total) by mouth 2 (two) times daily. 60 tablet 6  ? atorvastatin (LIPITOR) 80 MG tablet Take 1 tablet (80 mg total) by mouth daily at 6 PM. 30 tablet 6  ? furosemide (LASIX) 40 MG tablet Take 1 tablet (40 mg total) by mouth daily. Take an extra 40mg  (1 tablet) for weight 190lbs or greater 30 tablet 6  ? losartan (COZAAR) 25 MG tablet Take 1 tablet (25 mg total) by mouth at bedtime. 30 tablet 6  ? Multiple Vitamin (MULTIVITAMIN WITH MINERALS) TABS tablet Take 1 tablet by mouth daily.    ? potassium chloride (K-DUR) 10 MEQ tablet Take 2 tablets (20 mEq total) by mouth daily. 60 tablet 6  ? ?No current facility-administered medications for this visit.  ? ? ? ?Past Surgical History:  ?Procedure Laterality Date  ? CARDIOVERSION N/A 03/05/2015  ? Procedure: CARDIOVERSION;  Surgeon: Jolaine Artist, MD;  Location: [place];  Service: Cardiovascular;  Laterality: N/A;  ? CARDIOVERSION N/A 05/20/2016  ?  Procedure: CARDIOVERSION;  Surgeon: Jolaine Artist, MD;  Location: [place];  Service: Cardiovascular;  Laterality: N/A;  ? CARDIOVERSION N/A 05/25/2016  ? Procedure: CARDIOVERSION;  Surgeon: Thayer Headings, MD;  Location: [place];  Service: Cardiovascular;  Laterality: N/A;  ? CARDIOVERSION N/A 02/03/2017  ? Procedure: CARDIOVERSION;  Surgeon: Jolaine Artist, MD;  Location: [place];  Service: Cardiovascular;  Laterality: N/A;  ? CARDIOVERSION N/A 06/22/2018  ? Procedure: CARDIOVERSION;  Surgeon: Jolaine Artist, MD;  Location: [place];  Service: Cardiovascular;  Laterality: N/A;  ? CARDIOVERSION N/A 06/26/2018  ? Procedure: CARDIOVERSION;  Surgeon: Jolaine Artist, MD;  Location: [facility]  ENDOSCOPY;  Service: Cardiovascular;  Laterality: N/A;  ? CATARACT EXTRACTION W/PHACO Left 02/07/2022  ? Procedure: CATARACT EXTRACTION PHACO AND INTRAOCULAR LENS PLACEMENT (IOC);  Surgeon: Baruch Goldmann, MD;  Location: [facility];  Service: Ophthalmology;  Laterality: Left;  CDE: 12.86  ? CATARACT EXTRACTION W/PHACO Right 02/21/2022  ? Procedure: CATARACT EXTRACTION PHACO AND INTRAOCULAR LENS PLACEMENT (IOC);  Surgeon: Baruch Goldmann, MD;  Location: [facility];  Service: Ophthalmology;  Laterality: Right;  CDE: 6.31  ? TEE WITHOUT CARDIOVERSION N/A 03/05/2015  ? Procedure: TRANSESOPHAGEAL ECHOCARDIOGRAM (TEE);  Surgeon: Jolaine Artist, MD;  Location: [facility] ENDOSCOPY;  Service: Cardiovascular;  Laterality: N/A;  ? TEE WITHOUT CARDIOVERSION N/A 05/20/2016  ? Procedure: TRANSESOPHAGEAL ECHOCARDIOGRAM (TEE);  Surgeon: Jolaine Artist, MD;  Location: [place];  Service: Cardiovascular;  Laterality: N/A;  ? TEE WITHOUT CARDIOVERSION N/A 02/03/2017  ? Procedure: TRANSESOPHAGEAL ECHOCARDIOGRAM (TEE);  Surgeon: Jolaine Artist, MD;  Location: [facility] ENDOSCOPY;  Service: Cardiovascular;  Laterality: N/A;  ? ? ? ?Allergies  ?Allergen Reactions  ? Amiodarone Rash  ?  Rash resolved when amio stopped 2013, retried in 2017 with same reaction  ? ? ? ? ?Family History  ?Problem Relation Age of Onset  ? Emphysema Father   ? Heart disease Father   ? Other Mother   ?     died during a leg surgery  ? Diabetes Brother   ? Heart attack Brother   ? ? ? ?Social History ?Mr. Giovannetti reports that he has never smoked. He has never used smokeless tobacco. ?Mr. Henkes reports current alcohol use. ? ? ?Review of Systems ?CONSTITUTIONAL: No weight loss, fever, chills, weakness or fatigue.  ?HEENT: Eyes: No visual loss, blurred vision, double vision or yellow sclerae.No hearing loss, sneezing, congestion, runny nose or sore throat.  ?SKIN: No rash or itching.  ?CARDIOVASCULAR: per hpi ?RESPIRATORY: No shortness of breath, cough or sputum.   ?GASTROINTESTINAL: No anorexia, nausea, vomiting or diarrhea. No abdominal pain or blood.  ?GENITOURINARY: No burning on urination, no polyuria ?NEUROLOGICAL: No headache, dizziness, syncope, paralysis, ataxia, numbness or tingling in the extremities. No change in bowel or bladder control.  ?MUSCULOSKELETAL: No muscle, back pain, joint pain or stiffness.  ?LYMPHATICS: No enlarged nodes. No history of splenectomy.  ?PSYCHIATRIC: No history of depression or anxiety.  ?ENDOCRINOLOGIC: No reports of sweating, cold or heat intolerance. No polyuria or polydipsia.  ?. ? ? ?Physical Examination ?Today's Vitals  ? 03/15/22 1320  ?BP: (!) 148/80  ?Pulse: 70  ?SpO2: 98%  ?Weight: 206 lb (93.4 kg)  ?Height: 5\' 5"  (1.651 m)  ? ?Body mass index is 34.28 kg/m?. ? ?Gen: resting comfortably, no acute distress ?HEENT: no scleral icterus, pupils equal round and reactive, no palptable cervical adenopathy,  ?CV: RRR, no m/r/g no jvd ?Resp: Clear to auscultation bilaterally ?GI: abdomen is soft, non-tender, non-distended, normal  bowel sounds, no hepatosplenomegaly ?MSK: extremities are warm, no edema.  ?Skin: warm, no rash ?Neuro:  no focal deficits ?Psych: appropriate affect ? ? ?Diagnostic Studies ? ? ? ? ?Assessment and Plan  ? ?1. Chronic systolic HF ?- no symptoms, euvolemic today ?- repeat echo ?- medical therapy has been limited by renal dysfunction, request pcp labs, pending kidney function and echo  may consider medication changes ?- no beta blocker due to low output during 2019 admission ?  ?  ?2. Aflutter ?- NSR today ?- needs to stay on amio 200mg  daily. Request pcp labs to monitor liver and thyroid function ?- he stopped anticoagulation on his own due to nosebleeds. He is not interested in retrying nor is he interested in a watchman device. Long disucssion with he and his wife, they understand stroke risk.  ?  ?  ?3. CKD 3 ?- request pcp labs ?  ? ? ? ? ?Arnoldo Lenis, M.D. ?

## 2022-03-15 NOTE — Patient Instructions (Addendum)
Medication Instructions:  ?Remain off of the Eliquis, removed from list today.  ?Continue all other medications.    ? ?Labwork: ?none ? ?Testing/Procedures: ?Your physician has requested that you have an echocardiogram. Echocardiography is a painless test that uses sound waves to create images of your heart. It provides your doctor with information about the size and shape of your heart and how well your heart?s chambers and valves are working. This procedure takes approximately one hour. There are no restrictions for this procedure. ?Office will contact with results via phone or letter.    ? ?Follow-Up: ?4 months  ? ?Any Other Special Instructions Will Be Listed Below (If Applicable). ? ? ?If you need a refill on your cardiac medications before your next appointment, please call your pharmacy. ? ?

## 2022-03-23 ENCOUNTER — Ambulatory Visit (INDEPENDENT_AMBULATORY_CARE_PROVIDER_SITE_OTHER): Payer: Medicare HMO

## 2022-03-23 DIAGNOSIS — I5022 Chronic systolic (congestive) heart failure: Secondary | ICD-10-CM

## 2022-03-23 LAB — ECHOCARDIOGRAM COMPLETE
Area-P 1/2: 4.24 cm2
MV M vel: 5.05 m/s
MV Peak grad: 101.8 mmHg
Radius: 0.7 cm
S' Lateral: 4.52 cm

## 2022-03-28 ENCOUNTER — Telehealth: Payer: Self-pay

## 2022-03-28 NOTE — Telephone Encounter (Signed)
Patient notified and verbalized understanding. Patient had no questions or concerns at this time. PCP copied 

## 2022-03-28 NOTE — Telephone Encounter (Signed)
-----   Message from Antoine Poche, MD sent at 03/28/2022 10:14 AM EDT ----- ?Echo shows heart function has significantly improved and now is back in the normal range which is great new.  ? ?Dominga Ferry MD ?

## 2022-04-14 ENCOUNTER — Other Ambulatory Visit: Payer: Medicare HMO

## 2022-04-19 ENCOUNTER — Ambulatory Visit: Payer: Medicare HMO | Admitting: Cardiology

## 2022-07-19 ENCOUNTER — Encounter: Payer: Self-pay | Admitting: Cardiology

## 2022-07-19 ENCOUNTER — Ambulatory Visit: Payer: Medicare HMO | Admitting: Cardiology

## 2022-07-19 VITALS — BP 130/80 | HR 60 | Ht 65.0 in | Wt 207.4 lb

## 2022-07-19 DIAGNOSIS — I5022 Chronic systolic (congestive) heart failure: Secondary | ICD-10-CM

## 2022-07-19 DIAGNOSIS — I4892 Unspecified atrial flutter: Secondary | ICD-10-CM | POA: Diagnosis not present

## 2022-07-19 NOTE — Progress Notes (Signed)
Clinical Summary Martin [name] is a 76 y.o.male seen today for follow up of the following medical problems.   1. Chronic systolic HF -  admission 06/2018 with cardiogenic shock requiring milrionone and levo drip - presented after stopping home meds x 6 months - echo 05/2018 [facility] LVEF 20-25% - no beta blocker due to low CO. He refused to f/u in CHF clinic - poor renal function, poor cath candidate     02/2022 echo: LVEF 50-55%, grade II dd, normal RV, PASP 47, mod MR - no SOB, no DOE or LE edema   2. Aflutter - s/p DCCV on 06/22/18 and 06/25/18 - started on amio 400mg  bid, plans to do 200mg  bid starting 07/12/18 - from notes discussions about av nodal ablation with plans for BiV pacing but patient refused     - history of rectus sheath heatoma in 2013, he stopped anticoag at that time. Retried few years later and had nose bleeds, he stopped eliquis at that time on his own. He is not interested in retrying nor is he interested in watchmand evice.    - no recent palpitatoins - compliant with meds  02/2022 normal LFTs, within last year normal TSH  3. CKD  - recent labs with pcp  4. Mitral regurgitation - 01/2022 echo mod MR - no symptoms Past Medical History:  Diagnosis Date   Acute blood loss anemia    Due to rectus sheath hematoma 03/2012   Acute renal failure (HCC)    03/2012 (not on ACEI due to this) - renal 04/2012 unremarkable   Atrial fibrillation (HCC)    Cardiomyopathy (HCC)    Transient cardiomyopathy with EF 30-35% 03/27/12, improved to 55-60% 04/16/12   CHF (congestive heart failure) (HCC)    Essential hypertension, benign    Hematuria 03/2012   Hyperglycemia 03/2012   Pleural effusion 03/2012   Rectus sheath hematoma    Spontaneous during 03/2012 hospitalization managed conservatively with blood products   Respiratory failure (HCC)    VDRF 03/2012 in setting of CHF   Shock (HCC)    03/2012 hospitalization - Cardiogenic shock requiring pressors initially, followed by  hemorrhagic shock later in hospitalization   Unspecified sleep apnea    Suspected during 03/2012 hospitalization      Allergies  Allergen Reactions   Amiodarone Rash    Rash resolved when amio stopped 2013, retried in 2017 with same reaction     Current Outpatient Medications  Medication Sig Dispense Refill   amiodarone (PACERONE) 200 MG tablet Take 1 tablet (200 mg total) by mouth daily.     apixaban (ELIQUIS) 5 MG TABS tablet Take 1 tablet (5 mg total) by mouth 2 (two) times daily. 60 tablet 6   atorvastatin (LIPITOR) 80 MG tablet Take 1 tablet (80 mg total) by mouth daily at 6 PM. 30 tablet 6   furosemide (LASIX) 40 MG tablet Take 1 tablet (40 mg total) by mouth daily. Take an extra 40mg  (1 tablet) for weight 190lbs or greater 30 tablet 6   losartan (COZAAR) 25 MG tablet Take 1 tablet (25 mg total) by mouth at bedtime. 30 tablet 6   Multiple Vitamin (MULTIVITAMIN WITH MINERALS) TABS tablet Take 1 tablet by mouth daily.     potassium chloride (K-DUR) 10 MEQ tablet Take 2 tablets (20 mEq total) by mouth daily. 60 tablet 6   No current facility-administered medications for this visit.     Past Surgical History:  Procedure Laterality Date  CARDIOVERSION N/A 03/05/2015   Procedure: CARDIOVERSION;  Surgeon: Dolores Patty, MD;  Location: [facility] ENDOSCOPY;  Service: Cardiovascular;  Laterality: N/A;   CARDIOVERSION N/A 05/20/2016   Procedure: CARDIOVERSION;  Surgeon: Dolores Patty, MD;  Location[facility] OR;  Service: Cardiovascular;  Laterality: N/A;   CARDIOVERSION N/A 05/25/2016   Procedure: CARDIOVERSION;  Surgeon: Vesta Mixer, MD;  Location: [facility] ENDOSCOPY;  Service: Cardiovascular;  Laterality: N/A;   CARDIOVERSION N/A 02/03/2017   Procedure: CARDIOVERSION;  Surgeon: Dolores Patty, MD;  Location: [facility] ENDOSCOPY;  Service: Cardiovascular;  Laterality: N/A;   CARDIOVERSION N/A 06/22/2018   Procedure: CARDIOVERSION;  Surgeon: Dolores Patty, MD;  Location: [facility] ENDOSCOPY;   Service: Cardiovascular;  Laterality: N/A;   CARDIOVERSION N/A 06/26/2018   Procedure: CARDIOVERSION;  Surgeon: Dolores Patty, MD;  Location: [facility] ENDOSCOPY;  Service: Cardiovascular;  Laterality: N/A;   CATARACT EXTRACTION W/PHACO Left 02/07/2022   Procedure: CATARACT EXTRACTION PHACO AND INTRAOCULAR LENS PLACEMENT (IOC);  Surgeon: Fabio [place], MD;  Location: [facility] ORS;  Service: Ophthalmology;  Laterality: Left;  CDE: 12.86   CATARACT EXTRACTION W/PHACO Right 02/21/2022   Procedure: CATARACT EXTRACTION PHACO AND INTRAOCULAR LENS PLACEMENT (IOC);  Surgeon: Fabio [place], MD;  Location: [facility];  Service: Ophthalmology;  Laterality: Right;  CDE: 6.31   TEE WITHOUT CARDIOVERSION N/A 03/05/2015   Procedure: TRANSESOPHAGEAL ECHOCARDIOGRAM (TEE);  Surgeon: Dolores Patty, MD;  Location: [facility] ENDOSCOPY;  Service: Cardiovascular;  Laterality: N/A;   TEE WITHOUT CARDIOVERSION N/A 05/20/2016   Procedure: TRANSESOPHAGEAL ECHOCARDIOGRAM (TEE);  Surgeon: Dolores Patty, MD;  Location: [facility] OR;  Service: Cardiovascular;  Laterality: N/A;   TEE WITHOUT CARDIOVERSION N/A 02/03/2017   Procedure: TRANSESOPHAGEAL ECHOCARDIOGRAM (TEE);  Surgeon: Dolores Patty, MD;  Location: [facility] ENDOSCOPY;  Service: Cardiovascular;  Laterality: N/A;     Allergies  Allergen Reactions   Amiodarone Rash    Rash resolved when amio stopped 2013, retried in 2017 with same reaction      Family History  Problem Relation Age of Onset   Emphysema Father    Heart disease Father    Other Mother        died during a leg surgery   Diabetes Brother    Heart attack Brother      Social History Martin [name] reports that he has never smoked. He has never used smokeless tobacco. Martin [name] reports current alcohol use.   Review of Systems CONSTITUTIONAL: No weight loss, fever, chills, weakness or fatigue.  HEENT: Eyes: No visual loss, blurred vision, double vision or yellow sclerae.No hearing loss, sneezing, congestion,  runny nose or sore throat.  SKIN: No rash or itching.  CARDIOVASCULAR: per hpi RESPIRATORY: No shortness of breath, cough or sputum.  GASTROINTESTINAL: No anorexia, nausea, vomiting or diarrhea. No abdominal pain or blood.  GENITOURINARY: No burning on urination, no polyuria NEUROLOGICAL: No headache, dizziness, syncope, paralysis, ataxia, numbness or tingling in the extremities. No change in bowel or bladder control.  MUSCULOSKELETAL: No muscle, back pain, joint pain or stiffness.  LYMPHATICS: No enlarged nodes. No history of splenectomy.  PSYCHIATRIC: No history of depression or anxiety.  ENDOCRINOLOGIC: No reports of sweating, cold or heat intolerance. No polyuria or polydipsia.  Marland Kitchen   Physical Examination Today's Vitals   07/19/22 0915  BP: 130/80  Pulse: 60  SpO2: 96%  Weight: 207 lb 6.4 oz (94.1 kg)  Height: 5\' 5"  (1.651 m)   Body mass index is 34.51 kg/m.  Gen: resting comfortably, no acute distress HEENT:  no scleral icterus, pupils equal round and reactive, no palptable cervical adenopathy,  CV: RRR, 3/6 systolic murmur apex Resp: Clear to auscultation bilaterally GI: abdomen is soft, non-tender, non-distended, normal bowel sounds, no hepatosplenomegaly MSK: extremities are warm, no edema.  Skin: warm, no rash Neuro:  no focal deficits Psych: appropriate affect   Diagnostic Studies  02/2022 echo 1. Left ventricular ejection fraction, by estimation, is 50 to 55%. The  left ventricle has low normal function. The left ventricle demonstrates  regional wall motion abnormalities (see scoring diagram/findings for  description). There is mild asymmetric  left ventricular hypertrophy of the basal and septal segments. Left  ventricular diastolic parameters are consistent with Grade II diastolic  dysfunction (pseudonormalization). Mildly reduced global longitudinal  strain of -15.9%.   2. Right ventricular systolic function is normal. The right ventricular  size is normal.  There is moderately elevated pulmonary artery systolic  pressure. The estimated right ventricular systolic pressure is [id number] mmHg.   3. Left atrial size was moderately dilated.   4. The mitral valve is abnormal with restricted posterior leaflet motion  and relative anterior leaflet prolapse. Moderate mitral valve  regurgitation, very eccentric and posteriorly directed.   5. The aortic valve is tricuspid. There is mild calcification of the  aortic valve. Aortic valve regurgitation is not visualized. Aortic valve  sclerosis/calcification is present, without any evidence of aortic  stenosis.   6. The inferior vena cava is normal in size with greater than 50%  respiratory variability, suggesting right atrial pressure of 3 mmHg.    Assessment and Plan  1. Chronic systolic HF - LVEF has since normalized by recent echo - no recent symptoms - medical therapy had been limited by renal dysfunction, low output HF initially was not committed to beta blocker - continue current meds     2. Aflutter -no symptoms, has done well on amio - he stopped anticoagulation on his own due to nosebleeds. He is not interested in retrying nor is he interested in a watchman device. Multiple disucssions with he and his wife, they understand stroke risk.         Arnoldo Lenis, M.D.

## 2022-07-19 NOTE — Patient Instructions (Signed)
Medication Instructions:  Continue all current medications.   Labwork: none  Testing/Procedures: none  Follow-Up: 6 months   Any Other Special Instructions Will Be Listed Below (If Applicable).   If you need a refill on your cardiac medications before your next appointment, please call your pharmacy.  

## 2022-09-05 DIAGNOSIS — R972 Elevated prostate specific antigen [PSA]: Secondary | ICD-10-CM | POA: Diagnosis not present

## 2022-09-05 DIAGNOSIS — R7303 Prediabetes: Secondary | ICD-10-CM | POA: Diagnosis not present

## 2022-09-05 DIAGNOSIS — E782 Mixed hyperlipidemia: Secondary | ICD-10-CM | POA: Diagnosis not present

## 2022-09-12 ENCOUNTER — Encounter: Payer: Self-pay | Admitting: Internal Medicine

## 2022-09-12 DIAGNOSIS — R945 Abnormal results of liver function studies: Secondary | ICD-10-CM | POA: Diagnosis not present

## 2022-09-12 DIAGNOSIS — I10 Essential (primary) hypertension: Secondary | ICD-10-CM | POA: Diagnosis not present

## 2022-09-12 DIAGNOSIS — I482 Chronic atrial fibrillation, unspecified: Secondary | ICD-10-CM | POA: Diagnosis not present

## 2022-09-12 DIAGNOSIS — Z6835 Body mass index (BMI) 35.0-35.9, adult: Secondary | ICD-10-CM | POA: Diagnosis not present

## 2022-09-12 DIAGNOSIS — E782 Mixed hyperlipidemia: Secondary | ICD-10-CM | POA: Diagnosis not present

## 2022-09-12 DIAGNOSIS — N1832 Chronic kidney disease, stage 3b: Secondary | ICD-10-CM | POA: Diagnosis not present

## 2022-09-12 DIAGNOSIS — I509 Heart failure, unspecified: Secondary | ICD-10-CM | POA: Diagnosis not present

## 2022-09-12 DIAGNOSIS — R972 Elevated prostate specific antigen [PSA]: Secondary | ICD-10-CM | POA: Diagnosis not present

## 2022-09-12 DIAGNOSIS — Z23 Encounter for immunization: Secondary | ICD-10-CM | POA: Diagnosis not present

## 2022-09-21 ENCOUNTER — Encounter: Payer: Self-pay | Admitting: *Deleted

## 2022-12-02 DIAGNOSIS — Z0000 Encounter for general adult medical examination without abnormal findings: Secondary | ICD-10-CM | POA: Diagnosis not present

## 2023-01-03 ENCOUNTER — Ambulatory Visit: Payer: Medicare HMO | Admitting: Cardiology

## 2023-02-24 ENCOUNTER — Encounter: Payer: Self-pay | Admitting: *Deleted

## 2023-03-07 DIAGNOSIS — E782 Mixed hyperlipidemia: Secondary | ICD-10-CM | POA: Diagnosis not present

## 2023-03-07 DIAGNOSIS — R7303 Prediabetes: Secondary | ICD-10-CM | POA: Diagnosis not present

## 2023-03-07 DIAGNOSIS — R972 Elevated prostate specific antigen [PSA]: Secondary | ICD-10-CM | POA: Diagnosis not present

## 2023-03-13 ENCOUNTER — Encounter: Payer: Self-pay | Admitting: Internal Medicine

## 2023-03-13 DIAGNOSIS — R972 Elevated prostate specific antigen [PSA]: Secondary | ICD-10-CM | POA: Diagnosis not present

## 2023-03-13 DIAGNOSIS — N1832 Chronic kidney disease, stage 3b: Secondary | ICD-10-CM | POA: Diagnosis not present

## 2023-03-13 DIAGNOSIS — I482 Chronic atrial fibrillation, unspecified: Secondary | ICD-10-CM | POA: Diagnosis not present

## 2023-03-13 DIAGNOSIS — R7401 Elevation of levels of liver transaminase levels: Secondary | ICD-10-CM | POA: Diagnosis not present

## 2023-03-13 DIAGNOSIS — E782 Mixed hyperlipidemia: Secondary | ICD-10-CM | POA: Diagnosis not present

## 2023-03-13 DIAGNOSIS — R7303 Prediabetes: Secondary | ICD-10-CM | POA: Diagnosis not present

## 2023-03-13 DIAGNOSIS — I130 Hypertensive heart and chronic kidney disease with heart failure and stage 1 through stage 4 chronic kidney disease, or unspecified chronic kidney disease: Secondary | ICD-10-CM | POA: Diagnosis not present

## 2023-03-13 DIAGNOSIS — I5022 Chronic systolic (congestive) heart failure: Secondary | ICD-10-CM | POA: Diagnosis not present

## 2023-03-13 DIAGNOSIS — I10 Essential (primary) hypertension: Secondary | ICD-10-CM | POA: Diagnosis not present

## 2023-03-17 ENCOUNTER — Encounter: Payer: Self-pay | Admitting: *Deleted

## 2023-03-30 ENCOUNTER — Ambulatory Visit: Payer: Self-pay | Admitting: *Deleted

## 2023-03-30 ENCOUNTER — Encounter: Payer: Self-pay | Admitting: *Deleted

## 2023-03-30 ENCOUNTER — Ambulatory Visit: Payer: Medicare HMO | Attending: Cardiology | Admitting: Cardiology

## 2023-03-30 ENCOUNTER — Encounter: Payer: Self-pay | Admitting: Cardiology

## 2023-03-30 VITALS — BP 120/70 | HR 58 | Ht 65.0 in | Wt 200.8 lb

## 2023-03-30 DIAGNOSIS — I5032 Chronic diastolic (congestive) heart failure: Secondary | ICD-10-CM

## 2023-03-30 DIAGNOSIS — I4892 Unspecified atrial flutter: Secondary | ICD-10-CM

## 2023-03-30 NOTE — Patient Instructions (Signed)
Medication Instructions:  Continue all current medications.   Labwork: none  Testing/Procedures: none  Follow-Up: 6 months   Any Other Special Instructions Will Be Listed Below (If Applicable).   If you need a refill on your cardiac medications before your next appointment, please call your pharmacy.  

## 2023-03-30 NOTE — Patient Outreach (Signed)
  Care Coordination   Initial Visit Note   03/30/2023  Name: Martin [name] MRN: [id number] DOB: [birth year]  Martin [name] is a 77 y.o. year old male who sees [place] Crane, Edwinna Areola, MD for primary care. I spoke with Dorann Ou by phone today.  What matters to the patients health and wellness today?  No interventions identified. Patient denies need for social work involvement at this time.  SDOH assessments and interventions completed:  Yes.  SDOH Interventions Today    Flowsheet Row Most Recent Value  SDOH Interventions   Food Insecurity Interventions Intervention Not Indicated  Housing Interventions Intervention Not Indicated  Transportation Interventions Intervention Not Indicated, Patient Resources (Friends/Family), Payor Benefit  Utilities Interventions Intervention Not Indicated  Alcohol Usage Interventions Intervention Not Indicated (Score <7)  Financial Strain Interventions Intervention Not Indicated  Physical Activity Interventions Patient Refused  Stress Interventions Intervention Not Indicated  Social Connections Interventions Intervention Not Indicated     Care Coordination Interventions:  No, not indicated.   Follow up plan: No further intervention required.   Encounter Outcome:  Pt. Visit Completed.   Nat Christen, BSW, MSW, LCSW  Licensed Education officer, [facility]  Mailing [place] [facility] [place], [place] [address] Physical Address-[address] [place], [place] [address] Toll Free Main # [phone] Fax # [phone] Cell # [phone] [email or web address]

## 2023-03-30 NOTE — Patient Instructions (Signed)
Visit Information  Thank you for taking time to visit with me today. Please don't hesitate to contact me if I can be of assistance to you.   Please call the care guide team at [phone] if you need to cancel or reschedule your appointment.   If you are experiencing a Mental Health or Behavioral Health Crisis or need someone to talk to, please call the Suicide and Crisis Lifeline: 988 call the [place] National Suicide Prevention Lifeline: 1-800-273-8255 or TTY: 1-800-799-4 TTY (1-800-799-4889) to talk to a trained counselor call 1-800-273-TALK (toll free, 24 hour hotline) go to [facility] [address], [place] ([phone]) call the [place] Crisis Line: 800-939-9988 call 911  Patient verbalizes understanding of instructions and care plan provided today and agrees to view in MyChart. Active MyChart status and patient understanding of how to access instructions and care plan via MyChart confirmed with patient.     No further follow up required.  Libbey Duce, BSW, MSW, LCSW  Licensed Clinical Social Worker  [facility] [place] [facility]  Mailing Address-[address], [place], [place] [address] Physical Address-[address], [place], [place] [address] Toll Free Main # [phone] Fax # [phone] Cell # [phone] [email or web address]            

## 2023-03-30 NOTE — Progress Notes (Signed)
Clinical Summary [name] is a 77 y.o.male seen today for follow up of the following medical problems.    1. Chronic systolic HF -  admission [id number] with cardiogenic shock requiring milrionone and levo drip - presented after stopping home meds x 6 months - echo 05/2018 [facility] LVEF 20-25% - no beta blocker due to low CO. He refused to f/u in CHF clinic - poor renal function, poor cath candidate     02/2022 echo: LVEF 50-55%, grade II dd, normal RV, PASP 47, mod MR No SOB/DOE, no LE edema - compliant with meds    2. Aflutter - s/p DCCV on 06/22/18 and 06/25/18 - started on amio 400mg  bid, plans to do 200mg  bid starting 07/12/18 - from notes discussions about av nodal ablation with plans for BiV pacing but patient refused     - history of rectus sheath heatoma in 2013, he stopped anticoag at that time. Retried few years later and had nose bleeds, he stopped eliquis at that time on his own. He is not interested in retrying nor is he interested in [place] device.    - no recent palpitatoins - compliant with meds  02/2022 normal LFTs, within last year normal TSH  - no recent palpitations - compliant with meds   3. CKD  - recent labs with pcp   4. Mitral regurgitation - 02/2022 echo mod MR   5. Hyperlipidemia 02/2023 TC 205 TG 146 HDL 57 LDL 122  Past Medical History:  Diagnosis Date   Acute blood loss anemia    Due to rectus sheath hematoma 03/2012   Acute renal failure ([place])    03/2012 (not on ACEI due to this) - renal US unremarkable   Atrial fibrillation ([place])    Cardiomyopathy ([place])    Transient cardiomyopathy with EF 30-35% 03/27/12, improved to 55-60% 04/16/12   CHF (congestive heart failure) ([place])    Essential hypertension, benign    Hematuria 03/2012   Hyperglycemia 03/2012   Pleural effusion 03/2012   Rectus sheath hematoma    Spontaneous during 03/2012 hospitalization managed conservatively with blood products   Respiratory failure ([place])    VDRF 03/2012 in  setting of CHF   Shock ([place])    03/2012 hospitalization - Cardiogenic shock requiring pressors initially, followed by hemorrhagic shock later in hospitalization   Unspecified sleep apnea    Suspected during 03/2012 hospitalization      Allergies  Allergen Reactions   Amiodarone Rash    Rash resolved when amio stopped 2013, retried in 2017 with same reaction     Current Outpatient Medications  Medication Sig Dispense Refill   amiodarone (PACERONE) 200 MG tablet Take 1 tablet (200 mg total) by mouth daily.     atorvastatin (LIPITOR) 80 MG tablet Take 1 tablet (80 mg total) by mouth daily at 6 PM. 30 tablet 6   furosemide (LASIX) 40 MG tablet Take 1 tablet (40 mg total) by mouth daily. Take an extra 40mg  (1 tablet) for weight 190lbs or greater 30 tablet 6   losartan (COZAAR) 25 MG tablet Take 1 tablet (25 mg total) by mouth at bedtime. 30 tablet 6   Multiple Vitamin (MULTIVITAMIN WITH MINERALS) TABS tablet Take 1 tablet by mouth daily.     potassium chloride (K-DUR) 10 MEQ tablet Take 2 tablets (20 mEq total) by mouth daily. 60 tablet 6   No current facility-administered medications for this visit.     Past Surgical History:  Procedure Laterality  Date   CARDIOVERSION N/A 03/05/2015   Procedure: CARDIOVERSION;  Surgeon: Jolaine Artist, MD;  Location: [place];  Service: Cardiovascular;  Laterality: N/A;   CARDIOVERSION N/A 05/20/2016   Procedure: CARDIOVERSION;  Surgeon: Jolaine Artist, MD;  Location: [place];  Service: Cardiovascular;  Laterality: N/A;   CARDIOVERSION N/A 05/25/2016   Procedure: CARDIOVERSION;  Surgeon: Thayer Headings, MD;  Location: [place];  Service: Cardiovascular;  Laterality: N/A;   CARDIOVERSION N/A 02/03/2017   Procedure: CARDIOVERSION;  Surgeon: Jolaine Artist, MD;  Location: [facility] ENDOSCOPY;  Service: Cardiovascular;  Laterality: N/A;   CARDIOVERSION N/A 06/22/2018   Procedure: CARDIOVERSION;  Surgeon: Jolaine Artist, MD;  Location: [place];  Service: Cardiovascular;  Laterality: N/A;   CARDIOVERSION N/A 06/26/2018   Procedure: CARDIOVERSION;  Surgeon: Jolaine Artist, MD;  Location: [place];  Service: Cardiovascular;  Laterality: N/A;   CATARACT EXTRACTION W/PHACO Left 02/07/2022   Procedure: CATARACT EXTRACTION PHACO AND INTRAOCULAR LENS PLACEMENT ([place]);  Surgeon: Baruch Goldmann, MD;  Location: [facility];  Service: Ophthalmology;  Laterality: Left;  CDE: 12.86   CATARACT EXTRACTION W/PHACO Right 02/21/2022   Procedure: CATARACT EXTRACTION PHACO AND INTRAOCULAR LENS PLACEMENT (IOC);  Surgeon: Baruch Goldmann, MD;  Location: [facility];  Service: Ophthalmology;  Laterality: Right;  CDE: 6.31   TEE WITHOUT CARDIOVERSION N/A 03/05/2015   Procedure: TRANSESOPHAGEAL ECHOCARDIOGRAM (TEE);  Surgeon: Jolaine Artist, MD;  Location[facility] ENDOSCOPY;  Service: Cardiovascular;  Laterality: N/A;   TEE WITHOUT CARDIOVERSION N/A 05/20/2016   Procedure: TRANSESOPHAGEAL ECHOCARDIOGRAM (TEE);  Surgeon: Jolaine Artist, MD;  Location: [facility] OR;  Service: Cardiovascular;  Laterality: N/A;   TEE WITHOUT CARDIOVERSION N/A 02/03/2017   Procedure: TRANSESOPHAGEAL ECHOCARDIOGRAM (TEE);  Surgeon: Jolaine Artist, MD;  Location: [facility] ENDOSCOPY;  Service: Cardiovascular;  Laterality: N/A;     Allergies  Allergen Reactions   Amiodarone Rash    Rash resolved when amio stopped 2013, retried in 2017 with same reaction      Family History  Problem Relation Age of Onset   Emphysema Father    Heart disease Father    Other Mother        died during a leg surgery   Diabetes Brother    Heart attack Brother      Social History [name] reports that he has never smoked. He has never used smokeless tobacco. [name] reports current alcohol use.   Review of Systems CONSTITUTIONAL: No weight loss, fever, chills, weakness or fatigue.  HEENT: Eyes: No visual loss, blurred vision, double vision or yellow sclerae.No hearing loss, sneezing,  congestion, runny nose or sore throat.  SKIN: No rash or itching.  CARDIOVASCULAR: per hpi RESPIRATORY: No shortness of breath, cough or sputum.  GASTROINTESTINAL: No anorexia, nausea, vomiting or diarrhea. No abdominal pain or blood.  GENITOURINARY: No burning on urination, no polyuria NEUROLOGICAL: No headache, dizziness, syncope, paralysis, ataxia, numbness or tingling in the extremities. No change in bowel or bladder control.  MUSCULOSKELETAL: No muscle, back pain, joint pain or stiffness.  LYMPHATICS: No enlarged nodes. No history of splenectomy.  PSYCHIATRIC: No history of depression or anxiety.  ENDOCRINOLOGIC: No reports of sweating, cold or heat intolerance. No polyuria or polydipsia.  Marland Kitchen   Physical Examination Today's Vitals   03/30/23 1230  BP: 120/70  Pulse: (!) 58  SpO2: 98%  Weight: 200 lb 12.8 oz (91.1 kg)  Height: 5\' 5"  (1.651 m)   Body mass index is 33.41 kg/m.  Gen: resting comfortably,  no acute distress HEENT: no scleral icterus, pupils equal round and reactive, no palptable cervical adenopathy,  CV: RRR, no mrg, no jvd Resp: Clear to auscultation bilaterally GI: abdomen is soft, non-tender, non-distended, normal bowel sounds, no hepatosplenomegaly MSK: extremities are warm, no edema.  Skin: warm, no rash Neuro:  no focal deficits Psych: appropriate affect   Diagnostic Studies  02/2022 echo 1. Left ventricular ejection fraction, by estimation, is 50 to 55%. The  left ventricle has low normal function. The left ventricle demonstrates  regional wall motion abnormalities (see scoring diagram/findings for  description). There is mild asymmetric  left ventricular hypertrophy of the basal and septal segments. Left  ventricular diastolic parameters are consistent with Grade II diastolic  dysfunction (pseudonormalization). Mildly reduced global longitudinal  strain of -15.9%.   2. Right ventricular systolic function is normal. The right ventricular  size is  normal. There is moderately elevated pulmonary artery systolic  pressure. The estimated right ventricular systolic pressure is [id number] mmHg.   3. Left atrial size was moderately dilated.   4. The mitral valve is abnormal with restricted posterior leaflet motion  and relative anterior leaflet prolapse. Moderate mitral valve  regurgitation, very eccentric and posteriorly directed.   5. The aortic valve is tricuspid. There is mild calcification of the  aortic valve. Aortic valve regurgitation is not visualized. Aortic valve  sclerosis/calcification is present, without any evidence of aortic  stenosis.   6. The inferior vena cava is normal in size with greater than 50%  respiratory variability, suggesting right atrial pressure of 3 mmHg.        Assessment and Plan   1. HFimpEF - LVEF has since normalized by recent echo - medical therapy had been limited by renal dysfunction, low output HF initially was not committed to beta blocker - no symptoms, continue current meds     2. Aflutter - has done well on amio - he stopped anticoagulation on his own due to nosebleeds. He is not interested in retrying nor is he interested in a watchman device. Multiple disucssions with he and his wife, they understand stroke risk. - EKG today shows NSR - continue current meds  3.Mitral regurgitation - moderate by last echo, recheck echo 2025.        Martin [name], M.D.

## 2023-07-10 DIAGNOSIS — E782 Mixed hyperlipidemia: Secondary | ICD-10-CM | POA: Diagnosis not present

## 2023-07-10 DIAGNOSIS — R7303 Prediabetes: Secondary | ICD-10-CM | POA: Diagnosis not present

## 2023-07-10 DIAGNOSIS — R972 Elevated prostate specific antigen [PSA]: Secondary | ICD-10-CM | POA: Diagnosis not present

## 2023-07-14 DIAGNOSIS — I110 Hypertensive heart disease with heart failure: Secondary | ICD-10-CM | POA: Diagnosis not present

## 2023-07-14 DIAGNOSIS — I10 Essential (primary) hypertension: Secondary | ICD-10-CM | POA: Diagnosis not present

## 2023-07-14 DIAGNOSIS — I482 Chronic atrial fibrillation, unspecified: Secondary | ICD-10-CM | POA: Diagnosis not present

## 2023-07-14 DIAGNOSIS — E782 Mixed hyperlipidemia: Secondary | ICD-10-CM | POA: Diagnosis not present

## 2023-07-14 DIAGNOSIS — E669 Obesity, unspecified: Secondary | ICD-10-CM | POA: Diagnosis not present

## 2023-07-14 DIAGNOSIS — R972 Elevated prostate specific antigen [PSA]: Secondary | ICD-10-CM | POA: Diagnosis not present

## 2023-07-14 DIAGNOSIS — I5022 Chronic systolic (congestive) heart failure: Secondary | ICD-10-CM | POA: Diagnosis not present

## 2023-07-14 DIAGNOSIS — N1832 Chronic kidney disease, stage 3b: Secondary | ICD-10-CM | POA: Diagnosis not present

## 2023-07-14 DIAGNOSIS — I130 Hypertensive heart and chronic kidney disease with heart failure and stage 1 through stage 4 chronic kidney disease, or unspecified chronic kidney disease: Secondary | ICD-10-CM | POA: Diagnosis not present

## 2023-09-01 ENCOUNTER — Encounter: Payer: Self-pay | Admitting: *Deleted

## 2023-12-04 DIAGNOSIS — E782 Mixed hyperlipidemia: Secondary | ICD-10-CM | POA: Diagnosis not present

## 2023-12-04 DIAGNOSIS — R7303 Prediabetes: Secondary | ICD-10-CM | POA: Diagnosis not present

## 2023-12-08 DIAGNOSIS — Z23 Encounter for immunization: Secondary | ICD-10-CM | POA: Diagnosis not present

## 2023-12-13 DIAGNOSIS — Z0001 Encounter for general adult medical examination with abnormal findings: Secondary | ICD-10-CM | POA: Diagnosis not present

## 2023-12-13 DIAGNOSIS — E782 Mixed hyperlipidemia: Secondary | ICD-10-CM | POA: Diagnosis not present

## 2023-12-13 DIAGNOSIS — I10 Essential (primary) hypertension: Secondary | ICD-10-CM | POA: Diagnosis not present

## 2023-12-13 DIAGNOSIS — I482 Chronic atrial fibrillation, unspecified: Secondary | ICD-10-CM | POA: Diagnosis not present

## 2023-12-13 DIAGNOSIS — R972 Elevated prostate specific antigen [PSA]: Secondary | ICD-10-CM | POA: Diagnosis not present

## 2023-12-13 DIAGNOSIS — I5022 Chronic systolic (congestive) heart failure: Secondary | ICD-10-CM | POA: Diagnosis not present

## 2023-12-13 DIAGNOSIS — I130 Hypertensive heart and chronic kidney disease with heart failure and stage 1 through stage 4 chronic kidney disease, or unspecified chronic kidney disease: Secondary | ICD-10-CM | POA: Diagnosis not present

## 2023-12-13 DIAGNOSIS — N1832 Chronic kidney disease, stage 3b: Secondary | ICD-10-CM | POA: Diagnosis not present

## 2023-12-13 DIAGNOSIS — Z6835 Body mass index (BMI) 35.0-35.9, adult: Secondary | ICD-10-CM | POA: Diagnosis not present

## 2024-02-20 ENCOUNTER — Ambulatory Visit: Payer: Medicare HMO | Attending: Nurse Practitioner | Admitting: Nurse Practitioner

## 2024-02-20 VITALS — BP 118/70 | HR 64 | Ht 65.0 in | Wt 194.4 lb

## 2024-02-20 DIAGNOSIS — N1832 Chronic kidney disease, stage 3b: Secondary | ICD-10-CM | POA: Diagnosis not present

## 2024-02-20 DIAGNOSIS — I340 Nonrheumatic mitral (valve) insufficiency: Secondary | ICD-10-CM

## 2024-02-20 DIAGNOSIS — I4892 Unspecified atrial flutter: Secondary | ICD-10-CM

## 2024-02-20 DIAGNOSIS — I5032 Chronic diastolic (congestive) heart failure: Secondary | ICD-10-CM

## 2024-02-20 DIAGNOSIS — E785 Hyperlipidemia, unspecified: Secondary | ICD-10-CM

## 2024-02-20 NOTE — Progress Notes (Signed)
 Cardiology Office Note:  .   Date:  02/20/2024  ID:  Martin [name], DOB 09-02-1946, MRN [id number] PCP: Martin Stabile, MD  [facility] Providers Cardiologist:  Martin Rich, MD    History of Present Illness: .   Martin [name] is a 78 y.o. male with a PMH of heart failure with improved ejection fraction, A-flutter, s/p DCCV in 2019, hyperlipidemia, mitral valve regurgitation, and CKD, presents today for 67-month follow-up appointment.  Last seen by Dr. Dina [name] on March 30, 2023.  He was doing well at that time, however he stopped oral anticoagulation on his own due to nosebleeds.  It was noted that he was not interested in retrying medication and was not interested in Martin [name] device.  Dr. Wyline Mood noted that he had multiple discussions with the patient and his wife and they understood stroke risk.  Today he presents for 97-month follow-up appointment.  He states he is doing well.  Denies any changes to his health since last follow-up visit. Tolerating his medications well.  Denies any acute cardiac concerns or issues. Denies any chest pain, shortness of breath, palpitations, syncope, presyncope, dizziness, orthopnea, PND, swelling or significant weight changes, acute bleeding, or claudication.  ROS: Negative.  See HPI. SH: Retired. Enjoys working on cars in his free time.  Studies Reviewed: Marland Kitchen       EKG: EKG is not ordered today.  Echocardiogram 02/2022: 1. Left ventricular ejection fraction, by estimation, is 50 to 55%. The  left ventricle has low normal function. The left ventricle demonstrates  regional wall motion abnormalities (see scoring diagram/findings for  description). There is mild asymmetric  left ventricular hypertrophy of the basal and septal segments. Left  ventricular diastolic parameters are consistent with Grade II diastolic  dysfunction (pseudonormalization). Mildly reduced global longitudinal  strain of -15.9%.   2. Right ventricular systolic function  is normal. The right ventricular  size is normal. There is moderately elevated pulmonary artery systolic  pressure. The estimated right ventricular systolic pressure is 46.8 mmHg.   3. Left atrial size was moderately dilated.   4. The mitral valve is abnormal with restricted posterior leaflet motion  and relative anterior leaflet prolapse. Moderate mitral valve  regurgitation, very eccentric and posteriorly directed.   5. The aortic valve is tricuspid. There is mild calcification of the  aortic valve. Aortic valve regurgitation is not visualized. Aortic valve  sclerosis/calcification is present, without any evidence of aortic  stenosis.   6. The inferior vena cava is normal in size with greater than 50%  respiratory variability, suggesting right atrial pressure of 3 mmHg.   Comparison(s): Prior images reviewed side by side. LVEF has improved  significantly.  NST 05/2016:  There was no ST segment deviation noted during stress. There is a medium defect of moderate severity present in the basal inferior, mid inferior and apical inferior location. The defect is non-reversible. This is consistent with either diaphragmatic attenuation artifact or infarct. No ischemia noted. There is a medium defect of severe severity present in the apical septal, apical inferior and apex location. The defect is reversible and consistent with ischemia. Findings consistent with ischemia. The TID is 1.29 consistent with transient ischemic dilatation which could indicate underlying multivessel CAD. This is a high risk study. Nuclear stress EF: 36% but visually appears 20-25%.  Physical Exam:   VS:  BP 118/70   Pulse 64   Ht 5\' 5"  (1.651 m)   Wt 194 lb 6.4 oz (88.2 kg)   BMI  32.35 kg/m    Wt Readings from Last 3 Encounters:  02/20/24 194 lb 6.4 oz (88.2 kg)  03/30/23 200 lb 12.8 oz (91.1 kg)  07/19/22 207 lb 6.4 oz (94.1 kg)    GEN: Obese, 78 year old male in no acute distress NECK: No JVD; No carotid  bruits CARDIAC: S1/S2, RRR, no murmurs, rubs, gallops RESPIRATORY:  Clear to auscultation without rales, wheezing or rhonchi  ABDOMEN: Soft, non-tender, non-distended EXTREMITIES:  No edema; No deformity   ASSESSMENT AND PLAN: .    HFimpEF Stage C, NYHA class I-II symptoms.  EF 50 to 55% in March 2023. Euvolemic and well compensated on exam.  Continue losartan and Lasix.  Continue potassium supplement. Low sodium diet, fluid restriction <2L, and daily weights encouraged. Educated to contact our office for weight gain of 2 lbs overnight or 5 lbs in one week.  A-flutter, s/p DCCV in 2019 Denies any tachycardia or palpitations.  Heart rate is well-controlled today.  Continue amiodarone.  Recommended for PCP to check thyroid level at next office visit.  LFTs on file from December 2024 were within normal limits.  As stated in HPI above, he declines OAC and has been made aware of stroke risk.  I addressed that with him today and went over stroke risk, patient continues to decline OAC.   HLD LDL 77 from December 2024.  Continue atorvastatin. Heart healthy diet and regular cardiovascular exercise encouraged.   MR Moderate MR noted on echocardiogram from March 2023.  Denies any symptoms.  Plan to update echocardiogram prior to next office visit.  CKD stage 3b Most recent labs stable.  Avoid nephrotoxic agents.  Continue follow-up with PCP.   Dispo: Follow-up with Dr. Diona Browner or APP in 6 months or sooner if anything changes.   Signed, Martin Dory, NP

## 2024-02-20 NOTE — Patient Instructions (Signed)

## 2024-04-15 DIAGNOSIS — R7303 Prediabetes: Secondary | ICD-10-CM | POA: Diagnosis not present

## 2024-04-15 DIAGNOSIS — R972 Elevated prostate specific antigen [PSA]: Secondary | ICD-10-CM | POA: Diagnosis not present

## 2024-04-15 DIAGNOSIS — E782 Mixed hyperlipidemia: Secondary | ICD-10-CM | POA: Diagnosis not present

## 2024-04-26 DIAGNOSIS — I130 Hypertensive heart and chronic kidney disease with heart failure and stage 1 through stage 4 chronic kidney disease, or unspecified chronic kidney disease: Secondary | ICD-10-CM | POA: Diagnosis not present

## 2024-04-26 DIAGNOSIS — R972 Elevated prostate specific antigen [PSA]: Secondary | ICD-10-CM | POA: Diagnosis not present

## 2024-04-26 DIAGNOSIS — I482 Chronic atrial fibrillation, unspecified: Secondary | ICD-10-CM | POA: Diagnosis not present

## 2024-04-26 DIAGNOSIS — I10 Essential (primary) hypertension: Secondary | ICD-10-CM | POA: Diagnosis not present

## 2024-04-26 DIAGNOSIS — N1832 Chronic kidney disease, stage 3b: Secondary | ICD-10-CM | POA: Diagnosis not present

## 2024-04-26 DIAGNOSIS — Z6835 Body mass index (BMI) 35.0-35.9, adult: Secondary | ICD-10-CM | POA: Diagnosis not present

## 2024-04-26 DIAGNOSIS — R945 Abnormal results of liver function studies: Secondary | ICD-10-CM | POA: Diagnosis not present

## 2024-04-26 DIAGNOSIS — E782 Mixed hyperlipidemia: Secondary | ICD-10-CM | POA: Diagnosis not present

## 2024-04-26 DIAGNOSIS — I5022 Chronic systolic (congestive) heart failure: Secondary | ICD-10-CM | POA: Diagnosis not present

## 2024-05-21 ENCOUNTER — Ambulatory Visit: Payer: Medicare HMO | Attending: Nurse Practitioner

## 2024-05-21 DIAGNOSIS — I340 Nonrheumatic mitral (valve) insufficiency: Secondary | ICD-10-CM

## 2024-05-21 DIAGNOSIS — I5032 Chronic diastolic (congestive) heart failure: Secondary | ICD-10-CM | POA: Diagnosis not present

## 2024-05-21 LAB — ECHOCARDIOGRAM COMPLETE
AR max vel: 1.6 cm2
AV Area VTI: 1.78 cm2
AV Area mean vel: 1.71 cm2
AV Mean grad: 6 mmHg
AV Peak grad: 13.1 mmHg
Ao pk vel: 1.81 m/s
Area-P 1/2: 3.76 cm2
Calc EF: 51.5 %
MV M vel: 3.7 m/s
MV Peak grad: 54.8 mmHg
MV VTI: 2.12 cm2
S' Lateral: 3.8 cm
Single Plane A2C EF: 56.3 %
Single Plane A4C EF: 50.4 %

## 2024-05-29 ENCOUNTER — Ambulatory Visit: Payer: Self-pay | Admitting: Nurse Practitioner

## 2024-06-03 NOTE — Telephone Encounter (Signed)
 Pt is returning call.

## 2024-08-20 ENCOUNTER — Ambulatory Visit: Payer: Medicare HMO | Admitting: Nurse Practitioner

## 2024-08-22 DIAGNOSIS — E782 Mixed hyperlipidemia: Secondary | ICD-10-CM | POA: Diagnosis not present

## 2024-08-22 DIAGNOSIS — R7303 Prediabetes: Secondary | ICD-10-CM | POA: Diagnosis not present

## 2024-08-28 DIAGNOSIS — I5022 Chronic systolic (congestive) heart failure: Secondary | ICD-10-CM | POA: Diagnosis not present

## 2024-08-28 DIAGNOSIS — I482 Chronic atrial fibrillation, unspecified: Secondary | ICD-10-CM | POA: Diagnosis not present

## 2024-08-28 DIAGNOSIS — R972 Elevated prostate specific antigen [PSA]: Secondary | ICD-10-CM | POA: Diagnosis not present

## 2024-08-28 DIAGNOSIS — R945 Abnormal results of liver function studies: Secondary | ICD-10-CM | POA: Diagnosis not present

## 2024-08-28 DIAGNOSIS — I10 Essential (primary) hypertension: Secondary | ICD-10-CM | POA: Diagnosis not present

## 2024-08-28 DIAGNOSIS — Z23 Encounter for immunization: Secondary | ICD-10-CM | POA: Diagnosis not present

## 2024-08-28 DIAGNOSIS — I130 Hypertensive heart and chronic kidney disease with heart failure and stage 1 through stage 4 chronic kidney disease, or unspecified chronic kidney disease: Secondary | ICD-10-CM | POA: Diagnosis not present

## 2024-08-28 DIAGNOSIS — E782 Mixed hyperlipidemia: Secondary | ICD-10-CM | POA: Diagnosis not present

## 2024-08-28 DIAGNOSIS — N1832 Chronic kidney disease, stage 3b: Secondary | ICD-10-CM | POA: Diagnosis not present

## 2025-01-28 ENCOUNTER — Other Ambulatory Visit (HOSPITAL_COMMUNITY): Payer: Self-pay | Admitting: Internal Medicine

## 2025-01-28 ENCOUNTER — Encounter: Payer: Self-pay | Admitting: Internal Medicine

## 2025-01-28 DIAGNOSIS — N1832 Chronic kidney disease, stage 3b: Secondary | ICD-10-CM

## 2025-02-17 ENCOUNTER — Ambulatory Visit (HOSPITAL_COMMUNITY): Admission: RE | Admit: 2025-02-17 | Source: Ambulatory Visit
